# Patient Record
Sex: Female | Born: 1940 | Race: White | Hispanic: No | Smoking: Never smoker
Health system: Southern US, Community
[De-identification: ages and names within clinical notes are randomized; demographics above are authoritative.]

## PROBLEM LIST (undated history)

## (undated) DIAGNOSIS — E079 Disorder of thyroid, unspecified: Secondary | ICD-10-CM

## (undated) DIAGNOSIS — K219 Gastro-esophageal reflux disease without esophagitis: Secondary | ICD-10-CM

## (undated) DIAGNOSIS — F329 Major depressive disorder, single episode, unspecified: Secondary | ICD-10-CM

## (undated) DIAGNOSIS — E785 Hyperlipidemia, unspecified: Secondary | ICD-10-CM

## (undated) DIAGNOSIS — I10 Essential (primary) hypertension: Secondary | ICD-10-CM

## (undated) DIAGNOSIS — F419 Anxiety disorder, unspecified: Secondary | ICD-10-CM

## (undated) DIAGNOSIS — N189 Chronic kidney disease, unspecified: Secondary | ICD-10-CM

## (undated) DIAGNOSIS — M25569 Pain in unspecified knee: Secondary | ICD-10-CM

## (undated) DIAGNOSIS — T7840XA Allergy, unspecified, initial encounter: Secondary | ICD-10-CM

## (undated) DIAGNOSIS — F32A Depression, unspecified: Secondary | ICD-10-CM

## (undated) DIAGNOSIS — G319 Degenerative disease of nervous system, unspecified: Secondary | ICD-10-CM

## (undated) DIAGNOSIS — H269 Unspecified cataract: Secondary | ICD-10-CM

## (undated) DIAGNOSIS — G8929 Other chronic pain: Secondary | ICD-10-CM

## (undated) HISTORY — DX: Disorder of thyroid, unspecified: E07.9

## (undated) HISTORY — PX: ABDOMINAL HYSTERECTOMY: SHX81

## (undated) HISTORY — PX: CHOLECYSTECTOMY: SHX55

## (undated) HISTORY — PX: OTHER SURGICAL HISTORY: SHX169

## (undated) HISTORY — DX: Chronic kidney disease, unspecified: N18.9

## (undated) HISTORY — DX: Allergy, unspecified, initial encounter: T78.40XA

## (undated) HISTORY — DX: Major depressive disorder, single episode, unspecified: F32.9

## (undated) HISTORY — DX: Depression, unspecified: F32.A

## (undated) HISTORY — DX: Anxiety disorder, unspecified: F41.9

## (undated) HISTORY — DX: Unspecified cataract: H26.9

## (undated) HISTORY — PX: MOUTH SURGERY: SHX715

---

## 2012-10-18 LAB — HM COLONOSCOPY

## 2013-08-29 DIAGNOSIS — H6120 Impacted cerumen, unspecified ear: Secondary | ICD-10-CM | POA: Diagnosis not present

## 2013-09-02 DIAGNOSIS — R29818 Other symptoms and signs involving the nervous system: Secondary | ICD-10-CM | POA: Diagnosis not present

## 2013-09-02 DIAGNOSIS — G9340 Encephalopathy, unspecified: Secondary | ICD-10-CM | POA: Diagnosis not present

## 2013-09-02 DIAGNOSIS — M6281 Muscle weakness (generalized): Secondary | ICD-10-CM | POA: Diagnosis not present

## 2013-10-18 DIAGNOSIS — N390 Urinary tract infection, site not specified: Secondary | ICD-10-CM | POA: Diagnosis not present

## 2013-10-18 DIAGNOSIS — R300 Dysuria: Secondary | ICD-10-CM | POA: Diagnosis not present

## 2013-10-18 DIAGNOSIS — R82998 Other abnormal findings in urine: Secondary | ICD-10-CM | POA: Diagnosis not present

## 2013-12-01 DIAGNOSIS — F411 Generalized anxiety disorder: Secondary | ICD-10-CM | POA: Diagnosis not present

## 2013-12-01 DIAGNOSIS — E039 Hypothyroidism, unspecified: Secondary | ICD-10-CM | POA: Diagnosis not present

## 2013-12-01 DIAGNOSIS — I10 Essential (primary) hypertension: Secondary | ICD-10-CM | POA: Diagnosis not present

## 2013-12-01 DIAGNOSIS — E785 Hyperlipidemia, unspecified: Secondary | ICD-10-CM | POA: Diagnosis not present

## 2013-12-02 DIAGNOSIS — N183 Chronic kidney disease, stage 3 unspecified: Secondary | ICD-10-CM | POA: Insufficient documentation

## 2014-02-03 DIAGNOSIS — R51 Headache: Secondary | ICD-10-CM | POA: Diagnosis not present

## 2014-02-03 DIAGNOSIS — G119 Hereditary ataxia, unspecified: Secondary | ICD-10-CM | POA: Diagnosis not present

## 2014-02-03 DIAGNOSIS — H532 Diplopia: Secondary | ICD-10-CM | POA: Diagnosis not present

## 2014-02-03 DIAGNOSIS — R269 Unspecified abnormalities of gait and mobility: Secondary | ICD-10-CM | POA: Diagnosis not present

## 2014-02-07 DIAGNOSIS — R69 Illness, unspecified: Secondary | ICD-10-CM | POA: Diagnosis not present

## 2014-02-14 DIAGNOSIS — R51 Headache: Secondary | ICD-10-CM | POA: Diagnosis not present

## 2014-02-14 DIAGNOSIS — R269 Unspecified abnormalities of gait and mobility: Secondary | ICD-10-CM | POA: Diagnosis not present

## 2014-02-23 DIAGNOSIS — Z1231 Encounter for screening mammogram for malignant neoplasm of breast: Secondary | ICD-10-CM | POA: Diagnosis not present

## 2014-03-08 DIAGNOSIS — F411 Generalized anxiety disorder: Secondary | ICD-10-CM | POA: Diagnosis not present

## 2014-03-08 DIAGNOSIS — M25569 Pain in unspecified knee: Secondary | ICD-10-CM | POA: Diagnosis not present

## 2014-03-25 ENCOUNTER — Inpatient Hospital Stay (HOSPITAL_COMMUNITY)
Admission: EM | Admit: 2014-03-25 | Discharge: 2014-03-27 | DRG: 059 | Disposition: A | Discharge status: Home / Self Care | Payer: Medicare Other | Attending: Internal Medicine | Admitting: Internal Medicine

## 2014-03-25 ENCOUNTER — Encounter (HOSPITAL_COMMUNITY): Payer: Self-pay | Admitting: Emergency Medicine

## 2014-03-25 ENCOUNTER — Emergency Department (HOSPITAL_COMMUNITY): Payer: Medicare Other

## 2014-03-25 DIAGNOSIS — N390 Urinary tract infection, site not specified: Secondary | ICD-10-CM | POA: Diagnosis not present

## 2014-03-25 DIAGNOSIS — R1310 Dysphagia, unspecified: Secondary | ICD-10-CM | POA: Diagnosis present

## 2014-03-25 DIAGNOSIS — I369 Nonrheumatic tricuspid valve disorder, unspecified: Secondary | ICD-10-CM | POA: Diagnosis not present

## 2014-03-25 DIAGNOSIS — E039 Hypothyroidism, unspecified: Secondary | ICD-10-CM | POA: Diagnosis present

## 2014-03-25 DIAGNOSIS — I10 Essential (primary) hypertension: Secondary | ICD-10-CM | POA: Diagnosis present

## 2014-03-25 DIAGNOSIS — R0789 Other chest pain: Secondary | ICD-10-CM | POA: Diagnosis present

## 2014-03-25 DIAGNOSIS — K222 Esophageal obstruction: Secondary | ICD-10-CM | POA: Diagnosis present

## 2014-03-25 DIAGNOSIS — R079 Chest pain, unspecified: Secondary | ICD-10-CM

## 2014-03-25 DIAGNOSIS — F411 Generalized anxiety disorder: Secondary | ICD-10-CM | POA: Diagnosis present

## 2014-03-25 DIAGNOSIS — R42 Dizziness and giddiness: Secondary | ICD-10-CM | POA: Diagnosis present

## 2014-03-25 DIAGNOSIS — Z66 Do not resuscitate: Secondary | ICD-10-CM | POA: Diagnosis present

## 2014-03-25 DIAGNOSIS — Z9849 Cataract extraction status, unspecified eye: Secondary | ICD-10-CM | POA: Diagnosis not present

## 2014-03-25 DIAGNOSIS — K219 Gastro-esophageal reflux disease without esophagitis: Secondary | ICD-10-CM | POA: Diagnosis present

## 2014-03-25 DIAGNOSIS — H5500 Unspecified nystagmus: Secondary | ICD-10-CM | POA: Diagnosis present

## 2014-03-25 DIAGNOSIS — G119 Hereditary ataxia, unspecified: Secondary | ICD-10-CM | POA: Diagnosis not present

## 2014-03-25 DIAGNOSIS — F329 Major depressive disorder, single episode, unspecified: Secondary | ICD-10-CM | POA: Diagnosis present

## 2014-03-25 DIAGNOSIS — S0990XA Unspecified injury of head, initial encounter: Secondary | ICD-10-CM | POA: Diagnosis not present

## 2014-03-25 DIAGNOSIS — R404 Transient alteration of awareness: Secondary | ICD-10-CM | POA: Diagnosis present

## 2014-03-25 DIAGNOSIS — E860 Dehydration: Secondary | ICD-10-CM | POA: Diagnosis present

## 2014-03-25 DIAGNOSIS — S0993XA Unspecified injury of face, initial encounter: Secondary | ICD-10-CM | POA: Diagnosis not present

## 2014-03-25 DIAGNOSIS — N289 Disorder of kidney and ureter, unspecified: Secondary | ICD-10-CM | POA: Diagnosis present

## 2014-03-25 DIAGNOSIS — W19XXXA Unspecified fall, initial encounter: Secondary | ICD-10-CM | POA: Diagnosis present

## 2014-03-25 DIAGNOSIS — S199XXA Unspecified injury of neck, initial encounter: Secondary | ICD-10-CM | POA: Diagnosis not present

## 2014-03-25 DIAGNOSIS — E785 Hyperlipidemia, unspecified: Secondary | ICD-10-CM | POA: Diagnosis present

## 2014-03-25 DIAGNOSIS — R55 Syncope and collapse: Secondary | ICD-10-CM | POA: Diagnosis present

## 2014-03-25 DIAGNOSIS — F3289 Other specified depressive episodes: Secondary | ICD-10-CM | POA: Diagnosis present

## 2014-03-25 DIAGNOSIS — H532 Diplopia: Secondary | ICD-10-CM | POA: Diagnosis present

## 2014-03-25 DIAGNOSIS — G319 Degenerative disease of nervous system, unspecified: Secondary | ICD-10-CM

## 2014-03-25 HISTORY — DX: Essential (primary) hypertension: I10

## 2014-03-25 HISTORY — DX: Other chronic pain: G89.29

## 2014-03-25 HISTORY — DX: Gastro-esophageal reflux disease without esophagitis: K21.9

## 2014-03-25 HISTORY — DX: Degenerative disease of nervous system, unspecified: G31.9

## 2014-03-25 HISTORY — DX: Hyperlipidemia, unspecified: E78.5

## 2014-03-25 HISTORY — DX: Pain in unspecified knee: M25.569

## 2014-03-25 LAB — URINE MICROSCOPIC-ADD ON

## 2014-03-25 LAB — BASIC METABOLIC PANEL
Anion gap: 10 (ref 5–15)
BUN: 32 mg/dL — AB (ref 6–23)
CO2: 26 mEq/L (ref 19–32)
CREATININE: 1.3 mg/dL — AB (ref 0.50–1.10)
Calcium: 9.1 mg/dL (ref 8.4–10.5)
Chloride: 107 mEq/L (ref 96–112)
GFR calc Af Amer: 46 mL/min — ABNORMAL LOW (ref >90)
GFR, EST NON AFRICAN AMERICAN: 39 mL/min — AB (ref >90)
Glucose, Bld: 97 mg/dL (ref 70–99)
Potassium: 4.4 mEq/L (ref 3.7–5.3)
Sodium: 143 mEq/L (ref 137–147)

## 2014-03-25 LAB — URINALYSIS, ROUTINE W REFLEX MICROSCOPIC
Bilirubin Urine: NEGATIVE
GLUCOSE, UA: NEGATIVE mg/dL
HGB URINE DIPSTICK: NEGATIVE
Ketones, ur: NEGATIVE mg/dL
Nitrite: POSITIVE — AB
Protein, ur: NEGATIVE mg/dL
Specific Gravity, Urine: 1.016 (ref 1.005–1.030)
Urobilinogen, UA: 0.2 mg/dL (ref 0.0–1.0)
pH: 7 (ref 5.0–8.0)

## 2014-03-25 LAB — CBC WITH DIFFERENTIAL/PLATELET
BASOS PCT: 2 % — AB (ref 0–1)
Basophils Absolute: 0.1 10*3/uL (ref 0.0–0.1)
Eosinophils Absolute: 0.3 10*3/uL (ref 0.0–0.7)
Eosinophils Relative: 7 % — ABNORMAL HIGH (ref 0–5)
HEMATOCRIT: 33 % — AB (ref 36.0–46.0)
HEMOGLOBIN: 10.4 g/dL — AB (ref 12.0–15.0)
Lymphocytes Relative: 27 % (ref 12–46)
Lymphs Abs: 1.3 10*3/uL (ref 0.7–4.0)
MCH: 30.2 pg (ref 26.0–34.0)
MCHC: 31.5 g/dL (ref 30.0–36.0)
MCV: 95.9 fL (ref 78.0–100.0)
MONO ABS: 0.4 10*3/uL (ref 0.1–1.0)
MONOS PCT: 9 % (ref 3–12)
Neutro Abs: 2.6 10*3/uL (ref 1.7–7.7)
Neutrophils Relative %: 55 % (ref 43–77)
Platelets: 147 10*3/uL — ABNORMAL LOW (ref 150–400)
RBC: 3.44 MIL/uL — ABNORMAL LOW (ref 3.87–5.11)
RDW: 13.4 % (ref 11.5–15.5)
WBC: 4.7 10*3/uL (ref 4.0–10.5)

## 2014-03-25 LAB — OSMOLALITY, URINE: OSMOLALITY UR: 613 mosm/kg (ref 390–1090)

## 2014-03-25 LAB — LIPID PANEL
CHOLESTEROL: 161 mg/dL (ref 0–200)
HDL: 48 mg/dL (ref >39)
LDL Cholesterol: 88 mg/dL (ref 0–99)
TRIGLYCERIDES: 124 mg/dL (ref <150)
Total CHOL/HDL Ratio: 3.4 RATIO
VLDL: 25 mg/dL (ref 0–40)

## 2014-03-25 LAB — OSMOLALITY: OSMOLALITY: 302 mosm/kg — AB (ref 275–300)

## 2014-03-25 LAB — TROPONIN I: Troponin I: <0.3 ng/mL (ref <0.30)

## 2014-03-25 LAB — MAGNESIUM: Magnesium: 2 mg/dL (ref 1.5–2.5)

## 2014-03-25 LAB — HEMOGLOBIN A1C
HEMOGLOBIN A1C: 5.9 % — AB (ref <5.7)
Mean Plasma Glucose: 123 mg/dL — ABNORMAL HIGH (ref <117)

## 2014-03-25 MED ORDER — DICLOFENAC SODIUM 75 MG PO TBEC
75.0000 mg | DELAYED_RELEASE_TABLET | Freq: Two times a day (BID) | ORAL | Status: DC
  Administered 2014-03-25 – 2014-03-27 (×4): 75 mg via ORAL
  Filled 2014-03-25 (×5): qty 1

## 2014-03-25 MED ORDER — ATORVASTATIN CALCIUM 40 MG PO TABS
40.0000 mg | ORAL_TABLET | Freq: Every day | ORAL | Status: DC
  Administered 2014-03-25 – 2014-03-26 (×2): 40 mg via ORAL
  Filled 2014-03-25 (×3): qty 1

## 2014-03-25 MED ORDER — ONDANSETRON HCL 4 MG/2ML IJ SOLN: 4.0000 mg | Freq: Four times a day (QID) | INTRAMUSCULAR | Status: DC | PRN

## 2014-03-25 MED ORDER — ADULT MULTIVITAMIN W/MINERALS CH
1.0000 | ORAL_TABLET | Freq: Every day | ORAL | Status: DC
  Administered 2014-03-26 – 2014-03-27 (×2): 1 via ORAL
  Filled 2014-03-25 (×2): qty 1

## 2014-03-25 MED ORDER — ASPIRIN 81 MG PO CHEW
324.0000 mg | CHEWABLE_TABLET | Freq: Once | ORAL | Status: DC
  Filled 2014-03-25: qty 4

## 2014-03-25 MED ORDER — ASPIRIN 81 MG PO CHEW
324.0000 mg | CHEWABLE_TABLET | Freq: Once | ORAL | Status: AC
  Administered 2014-03-25: 324 mg via ORAL
  Filled 2014-03-25: qty 4

## 2014-03-25 MED ORDER — HYDRALAZINE HCL 20 MG/ML IJ SOLN
5.0000 mg | Freq: Once | INTRAMUSCULAR | Status: AC
Start: 2014-03-25 — End: 2014-03-25
  Administered 2014-03-25: 5 mg via INTRAVENOUS
  Filled 2014-03-25: qty 1

## 2014-03-25 MED ORDER — SODIUM CHLORIDE 0.9 % IV SOLN
INTRAVENOUS | Status: DC
  Administered 2014-03-25: 75 mL/h via INTRAVENOUS
  Administered 2014-03-26: 04:00:00 via INTRAVENOUS

## 2014-03-25 MED ORDER — SODIUM CHLORIDE 0.9 % IJ SOLN: 3.0000 mL | Freq: Two times a day (BID) | INTRAMUSCULAR | Status: DC

## 2014-03-25 MED ORDER — PANTOPRAZOLE SODIUM 40 MG PO TBEC
40.0000 mg | DELAYED_RELEASE_TABLET | Freq: Every day | ORAL | Status: DC
  Administered 2014-03-26 – 2014-03-27 (×2): 40 mg via ORAL
  Filled 2014-03-25 (×2): qty 1

## 2014-03-25 MED ORDER — SERTRALINE HCL 100 MG PO TABS
100.0000 mg | ORAL_TABLET | Freq: Every day | ORAL | Status: DC
  Administered 2014-03-25 – 2014-03-27 (×3): 100 mg via ORAL
  Filled 2014-03-25 (×3): qty 1

## 2014-03-25 MED ORDER — GUAIFENESIN-DM 100-10 MG/5ML PO SYRP: 5.0000 mL | ORAL_SOLUTION | ORAL | Status: DC | PRN

## 2014-03-25 MED ORDER — ASPIRIN EC 81 MG PO TBEC
81.0000 mg | DELAYED_RELEASE_TABLET | Freq: Every day | ORAL | Status: DC
  Administered 2014-03-26 – 2014-03-27 (×2): 81 mg via ORAL
  Filled 2014-03-25 (×2): qty 1

## 2014-03-25 MED ORDER — LEVOTHYROXINE SODIUM 25 MCG PO TABS
25.0000 ug | ORAL_TABLET | Freq: Every day | ORAL | Status: DC
  Administered 2014-03-26 – 2014-03-27 (×2): 25 ug via ORAL
  Filled 2014-03-25 (×3): qty 1

## 2014-03-25 MED ORDER — MECLIZINE HCL 25 MG PO TABS
25.0000 mg | ORAL_TABLET | Freq: Three times a day (TID) | ORAL | Status: DC | PRN
  Administered 2014-03-25: 25 mg via ORAL
  Filled 2014-03-25: qty 1

## 2014-03-25 MED ORDER — ONDANSETRON HCL 4 MG PO TABS: 4.0000 mg | ORAL_TABLET | Freq: Four times a day (QID) | ORAL | Status: DC | PRN

## 2014-03-25 MED ORDER — POLYETHYLENE GLYCOL 3350 17 G PO PACK
17.0000 g | PACK | Freq: Every day | ORAL | Status: DC | PRN
  Filled 2014-03-25: qty 1

## 2014-03-25 MED ORDER — HEPARIN SODIUM (PORCINE) 5000 UNIT/ML IJ SOLN
5000.0000 [IU] | Freq: Three times a day (TID) | INTRAMUSCULAR | Status: DC
  Administered 2014-03-25 – 2014-03-27 (×5): 5000 [IU] via SUBCUTANEOUS
  Filled 2014-03-25 (×8): qty 1

## 2014-03-25 MED ORDER — SODIUM CHLORIDE 0.9 % IV SOLN
INTRAVENOUS | Status: DC
  Administered 2014-03-25: 16:00:00 via INTRAVENOUS

## 2014-03-25 MED ORDER — DEXTROSE 5 % IV SOLN
1.0000 g | Freq: Once | INTRAVENOUS | Status: AC
  Administered 2014-03-25: 1 g via INTRAVENOUS
  Filled 2014-03-25: qty 10

## 2014-03-25 MED ORDER — DIAZEPAM 2 MG PO TABS
2.0000 mg | ORAL_TABLET | Freq: Two times a day (BID) | ORAL | Status: DC | PRN
  Administered 2014-03-25 – 2014-03-26 (×2): 2 mg via ORAL
  Filled 2014-03-25 (×2): qty 1

## 2014-03-25 NOTE — ED Notes (Signed)
Bed: BD25 Expected date: 03/25/14 Expected time: 12:41 PM Means of arrival: Ambulance Comments: fall

## 2014-03-25 NOTE — Consult Note (Addendum)
CONSULT NOTE  Date: 03/25/2014               Patient Name:  Tamara Stein MRN: [id number]  DOB: [birth year] Age / Sex: 73 y.o., female        PCP: No primary provider on file. Primary Cardiologist: New / Unique Sillas            Referring Physician: Ronnie Derby              Reason for Consult: CP , dizziness           History of Present Illness: Patient is a 73 y.o. female with a PMHx of cerebellar atrophy/degeneration, gastroesophageal reflux disease, hypertension, hyperlipidemia, who was admitted to [facility] on 03/25/2014 for evaluation of episode of dizziness and falling off her scooter associated with some chest pain.  The patient has a history of cerebellar degeneration.  She is to be a lot but has had profound dizziness and would fall frequently. She's not able to walk now .  She has frequent episodes of dizziness . She uses a motor scooter to get around. Today she had an episode of dizziness and actually fell off her scooter. The scooter fell on top of her. She then experienced some chest pain that she thinks is similar to her gastroesophageal reflux disease..   She is very comfortable at this point. EKG reveals very minimal T wave changes.   Medications: Outpatient medications: Prescriptions prior to admission  Medication Sig Dispense Refill  . aspirin EC 81 MG tablet Take 81 mg by mouth daily.      Marland Kitchen atorvastatin (LIPITOR) 40 MG tablet Take 40 mg by mouth at bedtime.      . Calcium Carbonate-Vitamin D (CALTRATE 600+D PO) Take 1 tablet by mouth daily.      . diazepam (VALIUM) 2 MG tablet Take 2 mg by mouth every 6 (six) hours as needed for anxiety.      . diclofenac (VOLTAREN) 75 MG EC tablet Take 75 mg by mouth 2 (two) times daily.      . fexofenadine (ALLEGRA) 180 MG tablet Take 180 mg by mouth daily.      Marland Kitchen levothyroxine (SYNTHROID, LEVOTHROID) 25 MCG tablet Take 25 mcg by mouth daily before breakfast.      . lisinopril-hydrochlorothiazide (PRINZIDE,ZESTORETIC) 10-12.5 MG  per tablet Take 1 tablet by mouth daily.      . meclizine (ANTIVERT) 25 MG tablet Take 25 mg by mouth 3 (three) times daily as needed for dizziness.      . Multiple Vitamin (MULTIVITAMIN WITH MINERALS) TABS tablet Take 1 tablet by mouth daily.      Marland Kitchen omeprazole (PRILOSEC) 40 MG capsule Take 40 mg by mouth daily.      . sertraline (ZOLOFT) 100 MG tablet Take 100 mg by mouth daily.        Current medications: Current Facility-Administered Medications  Medication Dose Route Frequency Provider Last Rate Last Dose  . 0.9 %  sodium chloride infusion   Intravenous Continuous Thurnell Lose, MD 75 mL/hr at 03/25/14 1652 75 mL/hr at 03/25/14 1652  . [START ON 03/26/2014] aspirin EC tablet 81 mg  81 mg Oral Daily Thurnell Lose, MD      . atorvastatin (LIPITOR) tablet 40 mg  40 mg Oral QHS Thurnell Lose, MD      . diazepam (VALIUM) tablet 2 mg  2 mg Oral Q12H PRN Thurnell Lose, MD      .  diclofenac (VOLTAREN) EC tablet 75 mg  75 mg Oral BID Thurnell Lose, MD      . guaiFENesin-dextromethorphan (ROBITUSSIN DM) 100-10 MG/5ML syrup 5 mL  5 mL Oral Q4H PRN Thurnell Lose, MD      . heparin injection 5,000 Units  5,000 Units Subcutaneous 3 times per day Thurnell Lose, MD      . Derrill Memo ON 03/26/2014] levothyroxine (SYNTHROID, LEVOTHROID) tablet 25 mcg  25 mcg Oral QAC breakfast Thurnell Lose, MD      . meclizine (ANTIVERT) tablet 25 mg  25 mg Oral TID PRN Thurnell Lose, MD      . Derrill Memo ON 03/26/2014] multivitamin with minerals tablet 1 tablet  1 tablet Oral Daily Thurnell Lose, MD      . ondansetron (ZOFRAN) tablet 4 mg  4 mg Oral Q6H PRN Thurnell Lose, MD       Or  . ondansetron (ZOFRAN) injection 4 mg  4 mg Intravenous Q6H PRN Thurnell Lose, MD      . Derrill Memo ON 03/26/2014] pantoprazole (PROTONIX) EC tablet 40 mg  40 mg Oral Daily Thurnell Lose, MD      . polyethylene glycol (MIRALAX / GLYCOLAX) packet 17 g  17 g Oral Daily PRN Thurnell Lose, MD      . sertraline  (ZOLOFT) tablet 100 mg  100 mg Oral Daily Thurnell Lose, MD   100 mg at 03/25/14 1733  . sodium chloride 0.9 % injection 3 mL  3 mL Intravenous Q12H Thurnell Lose, MD         Allergies  Allergen Reactions  . Codeine     Knocks her out  . Penicillins Diarrhea  . Sulfa Antibiotics     Facial swelling      Past Medical History  Diagnosis Date  . GERD (gastroesophageal reflux disease)   . Hypertension   . Hyperlipidemia   . Cerebellar degeneration   . Chronic knee pain     History reviewed. No pertinent past surgical history.  History reviewed. No pertinent family history.  Social History:  reports that she has never smoked. She does not have any smokeless tobacco history on file. Her alcohol and drug histories are not on file.   Review of Systems: Constitutional:  denies fever, chills, diaphoresis, appetite change and fatigue.  HEENT: denies photophobia, eye pain, redness, hearing loss, ear pain, congestion, sore throat, rhinorrhea, sneezing, neck pain, neck stiffness and tinnitus.  Respiratory: denies SOB, DOE, cough, chest tightness, and wheezing.  Cardiovascular: denies chest pain, palpitations and leg swelling.  Gastrointestinal: admits to nausea, vomiting, abdominal pain, diarrhea, constipation, blood in stool.  Genitourinary: denies dysuria, urgency, frequency, hematuria, flank pain and difficulty urinating.  Musculoskeletal: denies  myalgias, back pain, joint swelling, arthralgias and gait problem.   Skin: denies pallor, rash and wound.  Neurological: She has frequent episodes of dizziness    Hematological: denies adenopathy, easy bruising, personal or family bleeding history.  Psychiatric/ Behavioral: denies suicidal ideation, mood changes, confusion, nervousness, sleep disturbance and agitation.    Physical Exam: BP 174/79  Pulse 73  Temp(Src) 98 F (36.7 C) (Oral)  Resp 20  Ht 5' 2"  (1.575 m)  Wt 200 lb (90.719 kg)  BMI 36.57 kg/m2  SpO2 100%  Wt  Readings from Last 3 Encounters:  03/25/14 200 lb (90.719 kg)    General: Vital signs reviewed and noted. Well-developed, well-nourished, in no acute distress; alert  Head: Normocephalic, atraumatic, sclera  anicteric,   Neck: Supple. Negative for carotid bruits. No JVD   Lungs:  Clear bilaterally, no  wheezes, rales, or rhonchi. Breathing is normal   Heart: RRR with S1 S2. No murmurs, rubs, or gallops   Abdomen:  Soft, non-tender, non-distended with normoactive bowel sounds. No hepatomegaly. No rebound/guarding. No obvious abdominal masses   MSK: Strength and the appear normal for age.   Extremities: No clubbing or cyanosis. No edema.  Distal pedal pulses are 2+ and equal   Neurologic: Alert and oriented X 3.  Speech is somewhat slurred.  Gross motor movements are intact.   Psych: Responds to questions appropriately with a normal affect.     Lab results: Basic Metabolic Panel:  Recent Labs Lab 03/25/14 1340  NA 143  K 4.4  CL 107  CO2 26  GLUCOSE 97  BUN 32*  CREATININE 1.30*  CALCIUM 9.1  MG 2.0    Liver Function Tests: No results found for this basename: AST, ALT, ALKPHOS, BILITOT, PROT, ALBUMIN,  in the last 168 hours No results found for this basename: LIPASE, AMYLASE,  in the last 168 hours No results found for this basename: AMMONIA,  in the last 168 hours  CBC:  Recent Labs Lab 03/25/14 1340  WBC 4.7  NEUTROABS 2.6  HGB 10.4*  HCT 33.0*  MCV 95.9  PLT 147*    Cardiac Enzymes:  Recent Labs Lab 03/25/14 1340  TROPONINI <0.30    BNP: No components found with this basename: POCBNP,   CBG: No results found for this basename: GLUCAP,  in the last 168 hours  Coagulation Studies: No results found for this basename: LABPROT, INR,  in the last 72 hours   Other results:  EKG:  NSR , NS T wave abn. Laterally    Imaging: Dg Chest 2 View  03/25/2014   CLINICAL DATA:  Syncope.  EXAM: CHEST  2 VIEW  COMPARISON:  None.  FINDINGS: Heart size is mildly  enlarged but there is no pulmonary edema. Lungs are clear. Prominent hiatal hernia is noted. No pneumothorax or pleural effusion. Degenerative change is present about the shoulders.  IMPRESSION: Bowel cardiomegaly without acute disease.  Hiatal hernia.   Electronically Signed   By: Inge Rise M.D.   On: 03/25/2014 15:01   Ct Head Wo Contrast  03/25/2014   CLINICAL DATA:  Fall.  Dizziness and nausea.  EXAM: CT HEAD WITHOUT CONTRAST  CT CERVICAL SPINE WITHOUT CONTRAST  TECHNIQUE: Multidetector CT imaging of the head and cervical spine was performed following the standard protocol without intravenous contrast. Multiplanar CT image reconstructions of the cervical spine were also generated.  COMPARISON:  None.  FINDINGS: CT HEAD FINDINGS  Ventricles are normal in configuration. There is ventricular and sulcal enlargement reflecting mild to moderate atrophy. Cerebellar atrophy is more prominent. Patchy white matter hypoattenuation is noted consistent with moderate chronic microvascular ischemic change. There are no parenchymal masses or mass effect. There is no evidence of a cortical infarct.  There are no extra-axial masses or abnormal fluid collections.  No intracranial hemorrhage  Visualized sinuses and mastoid air cells are clear. No skull lesion or fracture.  CT CERVICAL SPINE FINDINGS  No fracture. There is a grade 1 anterolisthesis of C4 on C5 that appears degenerative in origin. There is mild loss of disc height at C4-C5 with moderate loss disc height at C5-C6 where there is also endplate irregularity, sclerosis and spurring. Facet degenerative changes greatest on the right at C3-C4 and on the  left from C2-C3 through C6-C7.  Bones are demineralized. Soft tissues show vascular calcifications. There is no mass or adenopathy.  Lung apices show areas of interstitial prominence and mild scarring.  IMPRESSION: HEAD CT:  No acute intracranial abnormalities.  No skull fracture.  CERVICAL CT:  No fracture or acute  finding.   Electronically Signed   By: Lajean Manes M.D.   On: 03/25/2014 14:56   Ct Cervical Spine Wo Contrast  03/25/2014   CLINICAL DATA:  Fall.  Dizziness and nausea.  EXAM: CT HEAD WITHOUT CONTRAST  CT CERVICAL SPINE WITHOUT CONTRAST  TECHNIQUE: Multidetector CT imaging of the head and cervical spine was performed following the standard protocol without intravenous contrast. Multiplanar CT image reconstructions of the cervical spine were also generated.  COMPARISON:  None.  FINDINGS: CT HEAD FINDINGS  Ventricles are normal in configuration. There is ventricular and sulcal enlargement reflecting mild to moderate atrophy. Cerebellar atrophy is more prominent. Patchy white matter hypoattenuation is noted consistent with moderate chronic microvascular ischemic change. There are no parenchymal masses or mass effect. There is no evidence of a cortical infarct.  There are no extra-axial masses or abnormal fluid collections.  No intracranial hemorrhage  Visualized sinuses and mastoid air cells are clear. No skull lesion or fracture.  CT CERVICAL SPINE FINDINGS  No fracture. There is a grade 1 anterolisthesis of C4 on C5 that appears degenerative in origin. There is mild loss of disc height at C4-C5 with moderate loss disc height at C5-C6 where there is also endplate irregularity, sclerosis and spurring. Facet degenerative changes greatest on the right at C3-C4 and on the left from C2-C3 through C6-C7.  Bones are demineralized. Soft tissues show vascular calcifications. There is no mass or adenopathy.  Lung apices show areas of interstitial prominence and mild scarring.  IMPRESSION: HEAD CT:  No acute intracranial abnormalities.  No skull fracture.  CERVICAL CT:  No fracture or acute finding.   Electronically Signed   By: Lajean Manes M.D.   On: 03/25/2014 14:56      *    Assessment & Plan:  1. Chest discomfort: The patient's cardiac enzymes are negative. She thinks that the pains are similar to her  gastroesophageal reflux pain.  At this point I would get an echocardiogram. Troponins are negative so far.    At this point I would be relatively conservative. She's not a great candidate for invasive procedures.  Episodes of dizziness are likely to be due to her cerebellar degeneration.  This seems to be gradually worsening.  2. Syncope: This may have been due to her dizziness. We will we'll monitor overnight to make sure that her rhythm stable.  Her telemetry reveals normal sinus rhythm without arrhythmias at this point.  We'll see her tomorrow  For  followup visit      Ramond Dial., MD, [facility] 03/25/2014, 5:42 PM Office - [phone] Pager [phone]

## 2014-03-25 NOTE — H&P (Signed)
Patient Demographics  Tamara Stein, is a 73 y.o. female  MRN: [id number]   DOB - 09/19/1940  Admit Date - 03/25/2014  Outpatient Primary MD for the patient is No primary provider on file.   With History of -  Past Medical History  Diagnosis Date  . GERD (gastroesophageal reflux disease)   . Hypertension   . Hyperlipidemia   . Cerebellar degeneration   . Chronic knee pain       History reviewed. No pertinent past surgical history.  in for   Chief Complaint  Patient presents with  . Fall  . Dizziness  . Nausea     HPI  Tamara Stein  is a 73 y.o. female,  With H/O HTN, Dyslipidemia, GERD, Cerebellar Degeneration, Nystagmus, Dizziness, Dysphagia, who uses a scooter to ambulate, who is in her driveway on the scooter when she passed out and lost consciousness for a few minutes, she fell on the floor, no seizure-like activity, no bowel bladder incontinence or tongue bite. She has had dizziness for several years and attributes that to her cerebellar degeneration. Came to the ER after the syncopal episode, in the ER a sensual workup which included blood work, head and neck CT scan, chest x-ray and UA were unremarkable. EKG showed some T-wave inversion in lead 1 and aVL, she does report some intermittent chest pain but none today.   Was called to admit the patient for syncope workup.    Review of Systems    In addition to the HPI above,   No Fever-chills, No Headache, No changes with Vision or hearing, chronic nystagmus, diplopia and dizziness +ve problems swallowing food or Liquids, No Chest pain, Cough or Shortness of Breath, No Abdominal pain, No Nausea or Vommitting, Bowel movements are regular, No Blood in stool or Urine, No dysuria, No new skin rashes or bruises, No new joints pains-aches,   No new weakness, tingling, numbness in any extremity, chronic leg weakness No recent weight gain or loss, No polyuria, polydypsia or polyphagia, No significant Mental Stressors.  A full 10 point Review of Systems was done, except as stated above, all other Review of Systems were negative.   Social History History  Substance Use Topics  . Smoking status: Never Smoker   . Smokeless tobacco: Not on file  . Alcohol Use: Not on file      Family History ++ Cerebellar degeneration in 2 sisters  Prior to Admission medications   Medication Sig Start Date End Date Taking? Authorizing Provider  aspirin EC 81 MG tablet Take 81 mg by mouth daily.   Yes Historical Provider, MD  atorvastatin (LIPITOR) 40 MG tablet Take 40 mg by mouth at bedtime.   Yes Historical Provider, MD  Calcium Carbonate-Vitamin D (CALTRATE 600+D PO) Take 1 tablet by mouth daily.   Yes Historical Provider, MD  diazepam (VALIUM) 2 MG tablet Take 2 mg by mouth every 6 (six) hours as needed for anxiety.  Yes Historical Provider, MD  diclofenac (VOLTAREN) 75 MG EC tablet Take 75 mg by mouth 2 (two) times daily.   Yes Historical Provider, MD  fexofenadine (ALLEGRA) 180 MG tablet Take 180 mg by mouth daily.   Yes Historical Provider, MD  levothyroxine (SYNTHROID, LEVOTHROID) 25 MCG tablet Take 25 mcg by mouth daily before breakfast.   Yes Historical Provider, MD  lisinopril-hydrochlorothiazide (PRINZIDE,ZESTORETIC) 10-12.5 MG per tablet Take 1 tablet by mouth daily.   Yes Historical Provider, MD  meclizine (ANTIVERT) 25 MG tablet Take 25 mg by mouth 3 (three) times daily as needed for dizziness.   Yes Historical Provider, MD  Multiple Vitamin (MULTIVITAMIN WITH MINERALS) TABS tablet Take 1 tablet by mouth daily.   Yes Historical Provider, MD  omeprazole (PRILOSEC) 40 MG capsule Take 40 mg by mouth daily.   Yes Historical Provider, MD  sertraline (ZOLOFT) 100 MG tablet Take 100 mg by mouth daily.   Yes Historical Provider, MD     Allergies  Allergen Reactions  . Codeine     Knocks her out  . Penicillins Diarrhea  . Sulfa Antibiotics     Facial swelling     Physical Exam  Vitals  Blood pressure 133/64, pulse 72, temperature 98 F (36.7 C), temperature source Oral, resp. rate 19, SpO2 99.00%.   1. General obese elderly white female lying in bed in NAD,    2. Normal affect and insight, Not Suicidal or Homicidal, Awake Alert, Oriented X 3.  3. No F.N deficits, ALL C.Nerves Intact, Strength 5/5 upper extremities, 3/5 legs, Sensation intact all 4 extremities, Plantars down going, ++ nystagmus   4. Ears and Eyes appear Normal, Conjunctivae clear, PERRLA. Moist Oral Mucosa.  5. Supple Neck, No JVD, No cervical lymphadenopathy appriciated, No Carotid Bruits.  6. Symmetrical Chest wall movement, Good air movement bilaterally, CTAB.  7. RRR, No Gallops, Rubs or Murmurs, No Parasternal Heave.  8. Positive Bowel Sounds, Abdomen Soft, No tenderness, No organomegaly appriciated,No rebound -guarding or rigidity.  9.  No Cyanosis, Normal Skin Turgor, No Skin Rash or Bruise.  10. Good muscle tone,  joints appear normal , no effusions, Normal ROM.  11. No Palpable Lymph Nodes in Neck or Axillae     Data Review  CBC  Recent Labs Lab 03/25/14 1340  WBC 4.7  HGB 10.4*  HCT 33.0*  PLT 147*  MCV 95.9  MCH 30.2  MCHC 31.5  RDW 13.4  LYMPHSABS 1.3  MONOABS 0.4  EOSABS 0.3  BASOSABS 0.1   ------------------------------------------------------------------------------------------------------------------  Chemistries   Recent Labs Lab 03/25/14 1340  NA 143  K 4.4  CL 107  CO2 26  GLUCOSE 97  BUN 32*  CREATININE 1.30*  CALCIUM 9.1  MG 2.0   ------------------------------------------------------------------------------------------------------------------ CrCl is unknown because there is no height on file for the current  visit. ------------------------------------------------------------------------------------------------------------------ No results found for this basename: TSH, T4TOTAL, FREET3, T3FREE, THYROIDAB,  in the last 72 hours   Coagulation profile No results found for this basename: INR, PROTIME,  in the last 168 hours ------------------------------------------------------------------------------------------------------------------- No results found for this basename: DDIMER,  in the last 72 hours -------------------------------------------------------------------------------------------------------------------  Cardiac Enzymes  Recent Labs Lab 03/25/14 1340  TROPONINI <0.30   ------------------------------------------------------------------------------------------------------------------ No components found with this basename: POCBNP,    ---------------------------------------------------------------------------------------------------------------  Urinalysis    Component Value Date/Time   COLORURINE YELLOW 03/25/2014 [place] 03/25/2014 1359   LABSPEC 1.016 03/25/2014 1359   PHURINE 7.0 03/25/2014 1359   [place]  03/25/2014 [place] 03/25/2014 [place] 03/25/2014 [place] 03/25/2014 1359   PROTEINUR NEGATIVE 03/25/2014 1359   UROBILINOGEN 0.2 03/25/2014 1359   NITRITE POSITIVE* 03/25/2014 1359   LEUKOCYTESUR SMALL* 03/25/2014 1359    ----------------------------------------------------------------------------------------------------------------  Imaging results:   Dg Chest 2 View  03/25/2014   CLINICAL DATA:  Syncope.  EXAM: CHEST  2 VIEW  COMPARISON:  None.  FINDINGS: Heart size is mildly enlarged but there is no pulmonary edema. Lungs are clear. Prominent hiatal hernia is noted. No pneumothorax or pleural effusion. Degenerative change is present about the shoulders.  IMPRESSION: Bowel cardiomegaly without  acute disease.  Hiatal hernia.   Electronically Signed   By: Inge Rise M.D.   On: 03/25/2014 15:01   Ct Head Wo Contrast  03/25/2014   CLINICAL DATA:  Fall.  Dizziness and nausea.  EXAM: CT HEAD WITHOUT CONTRAST  CT CERVICAL SPINE WITHOUT CONTRAST  TECHNIQUE: Multidetector CT imaging of the head and cervical spine was performed following the standard protocol without intravenous contrast. Multiplanar CT image reconstructions of the cervical spine were also generated.  COMPARISON:  None.  FINDINGS: CT HEAD FINDINGS  Ventricles are normal in configuration. There is ventricular and sulcal enlargement reflecting mild to moderate atrophy. Cerebellar atrophy is more prominent. Patchy white matter hypoattenuation is noted consistent with moderate chronic microvascular ischemic change. There are no parenchymal masses or mass effect. There is no evidence of a cortical infarct.  There are no extra-axial masses or abnormal fluid collections.  No intracranial hemorrhage  Visualized sinuses and mastoid air cells are clear. No skull lesion or fracture.  CT CERVICAL SPINE FINDINGS  No fracture. There is a grade 1 anterolisthesis of C4 on C5 that appears degenerative in origin. There is mild loss of disc height at C4-C5 with moderate loss disc height at C5-C6 where there is also endplate irregularity, sclerosis and spurring. Facet degenerative changes greatest on the right at C3-C4 and on the left from C2-C3 through C6-C7.  Bones are demineralized. Soft tissues show vascular calcifications. There is no mass or adenopathy.  Lung apices show areas of interstitial prominence and mild scarring.  IMPRESSION: HEAD CT:  No acute intracranial abnormalities.  No skull fracture.  CERVICAL CT:  No fracture or acute finding.   Electronically Signed   By: Lajean Manes M.D.   On: 03/25/2014 14:56   Ct Cervical Spine Wo Contrast  03/25/2014   CLINICAL DATA:  Fall.  Dizziness and nausea.  EXAM: CT HEAD WITHOUT CONTRAST  CT CERVICAL  SPINE WITHOUT CONTRAST  TECHNIQUE: Multidetector CT imaging of the head and cervical spine was performed following the standard protocol without intravenous contrast. Multiplanar CT image reconstructions of the cervical spine were also generated.  COMPARISON:  None.  FINDINGS: CT HEAD FINDINGS  Ventricles are normal in configuration. There is ventricular and sulcal enlargement reflecting mild to moderate atrophy. Cerebellar atrophy is more prominent. Patchy white matter hypoattenuation is noted consistent with moderate chronic microvascular ischemic change. There are no parenchymal masses or mass effect. There is no evidence of a cortical infarct.  There are no extra-axial masses or abnormal fluid collections.  No intracranial hemorrhage  Visualized sinuses and mastoid air cells are clear. No skull lesion or fracture.  CT CERVICAL SPINE FINDINGS  No fracture. There is a grade 1 anterolisthesis of C4 on C5 that appears degenerative in origin. There is mild loss of disc height at C4-C5 with moderate loss disc  height at C5-C6 where there is also endplate irregularity, sclerosis and spurring. Facet degenerative changes greatest on the right at C3-C4 and on the left from C2-C3 through C6-C7.  Bones are demineralized. Soft tissues show vascular calcifications. There is no mass or adenopathy.  Lung apices show areas of interstitial prominence and mild scarring.  IMPRESSION: HEAD CT:  No acute intracranial abnormalities.  No skull fracture.  CERVICAL CT:  No fracture or acute finding.   Electronically Signed   By: Lajean Manes M.D.   On: 03/25/2014 14:56    My personal review of EKG: Rhythm NSR, Rate 72 /min, flipped t waves lead 1 and aVL     Assessment & Plan   1. Syncope with loss of consciousness. 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 She has long-standing nystagmus, diplopia and dizziness. However she does have some EKG changes.  Will be admitted on a telemetry bed, will  cycle troponins, obtain MRI of the brain to rule out stroke, echogram carotid duplex, will check orthostatic blood pressures to rule out orthostasis as part of her autonomic instability. Continue aspirin which he takes at home. She is chronically bedbound and wheelchair-bound. If workup negative outpatient neurology followup. She has a neurologist in [place].   2. Intermittent chest pain with some EKG changes. Chest pain-free, cycle troponin, telemetry monitor, check echogram, ED has consulted cardiology.   3. Renal insufficiency. No previous labwork in the system, this could be her baseline however she is on her ARB as well, will hold ARB, obtain urine electrolytes, gentle hydration, repeat BMP in the morning.   4. GERD. Continue PPI.   5. Dyslipidemia. On statin which will be continued.   6. Few month history of dysphagia. Her symptoms suggestive of esophageal stricture, we'll request speech to evaluate, she might require pain swallow, for now dysphagia 3 diet, feeding assistance aspiration precautions. Based on our studies we might need to consult GI as well inpatient versus outpatient.   7. Hypothyroidism continue home dose Synthroid.   8. Depression anxiety. Not suicidal homicidal, appear stable, on Zoloft and benzodiazepine which will be continued at home dose.      DVT Prophylaxis Heparin   AM Labs Ordered, also please review Full Orders  Family Communication: Admission, patients condition and plan of care including tests being ordered have been discussed with the patient and brother in [place] indicate understanding and agree with the plan and Code Status.  Code Status DNR  Likely DC to  home  Condition Fair  Time spent in minutes : 35    SINGH,PRASHANT K M.D on 03/25/2014 at 3:39 PM  Between 7am to 7pm - Pager - [phone]  After 7pm go to [email or web address] - password [email or web address]  And look for the night coverage person covering me after hours  [facility]  [facility] Office  [phone]   **Disclaimer: This note may have been dictated with voice recognition software. Similar sounding words can inadvertently be transcribed and this note may contain transcription errors which may not have been corrected upon publication of note.**

## 2014-03-25 NOTE — ED Notes (Signed)
MD at bedside. 

## 2014-03-25 NOTE — ED Notes (Signed)
PER EMS- pt picked up from home c/o fall, reported she has benn dizzy and nauseated x3 days.  No LOC, head injury, or deformities noted.  Arrived to ED alert and oriented per baseline. Denies pain.

## 2014-03-25 NOTE — ED Notes (Signed)
Patient transported to CT 

## 2014-03-25 NOTE — ED Provider Notes (Addendum)
TIME SEEN: 1:25 PM  CHIEF COMPLAINT: Chest pain, shortness of breath, syncope  HPI: Patient is a 73 year old female with history of hypertension, hyperlipidemia, cerebellar degeneration who presents emergency department after a syncopal event. She states that she was in her motorized scooter when she began to feel lightheaded, having chest tightness, nausea and shortness of breath. She states that she passed out and fell out of her sputum and the scooter landed on top of her. She is unsure if she hit her head but states she was having neck pain that has now resolved. No numbness, tingling or new weakness. She is not on anticoagulation. Denies a history of cardiac disease or CHF. States she has had "dizziness" in the past secondary to her cerebellar degeneration but states this feels different. Denies any symptoms currently.   ROS: See HPI Constitutional: no fever  Eyes: no drainage  ENT: no runny nose   Cardiovascular:  chest pain  Resp:  SOB  GI: no vomiting GU: no dysuria Integumentary: no rash  Allergy: no hives  Musculoskeletal: no leg swelling  Neurological: no slurred speech ROS otherwise negative  PAST MEDICAL HISTORY/PAST SURGICAL HISTORY:  Past Medical History  Diagnosis Date  . GERD (gastroesophageal reflux disease)   . Hypertension   . Hyperlipidemia   . Cerebellar degeneration   . Chronic knee pain     MEDICATIONS:  Prior to Admission medications   Not on File    ALLERGIES:  Allergies not on file  SOCIAL HISTORY:  History  Substance Use Topics  . Smoking status: Never Smoker   . Smokeless tobacco: Not on file  . Alcohol Use: Not on file    FAMILY HISTORY: History reviewed. No pertinent family history.  EXAM: BP 133/64  Pulse 72  Temp(Src) 98 F (36.7 C) (Oral)  Resp 19  SpO2 99% CONSTITUTIONAL: Alert and oriented and responds appropriately to questions. Well-appearing; well-nourished HEAD: Normocephalic EYES: Conjunctivae clear, PERRL ENT:  normal nose; no rhinorrhea; moist mucous membranes; pharynx without lesions noted NECK: Supple, no meningismus, no LAD; no midline spinal tenderness or step-off or deformity CARD: RRR; S1 and S2 appreciated; no murmurs, no clicks, no rubs, no gallops RESP: Normal chest excursion without splinting or tachypnea; breath sounds clear and equal bilaterally; no wheezes, no rhonchi, no rales,  ABD/GI: Normal bowel sounds; non-distended; soft, non-tender, no rebound, no guarding BACK:  The back appears normal and is non-tender to palpation, there is no CVA tenderness, no midline spinal tenderness or step-off or deformity EXT: Normal ROM in all joints; non-tender to palpation; no edema; normal capillary refill; no cyanosis    SKIN: Normal color for age and race; warm NEURO: Moves all extremities equally, sensation to light touch intact diffusely, cranial nerves II through XII intact, patient is mildly dysarthric but states this is her baseline PSYCH: The patient's mood and manner are appropriate. Grooming and personal hygiene are appropriate.  MEDICAL DECISION MAKING: Patient here with a syncopal event today. She did have preceding symptoms of chest pain, shortness of breath and nausea as well as lightheadedness. She is currently asymptomatic. Hemodynamically stable and neurologically intact. She does have mild dysarthria that she reports is baseline. We'll obtain labs, urine. We'll also obtain CT of her head and cervical spine given she did fall out of her scooter and states the scooter fell on top of her. Anticipate admission for syncopal workup. Doubt PE or dissection at this time given she is asymptomatic.  ED PROGRESS: Patient's CT head and  cervical spine are negative. Labs are unremarkable. Troponin negative. She does have a urinary tract infection. Culture pending. We'll give ceftriaxone. We'll discuss with hospitalist for admission for syncopal workup, ACS rule out.   D/w Dr. Candiss Norse for admission to  tele, inpt.     Date: 03/25/2014 1:32 PM  Rate: 72  Rhythm: normal sinus rhythm  QRS Axis: normal  Intervals: normal  ST/T Wave abnormalities: T wave inversions in lateral leads  Conduction Disutrbances: none  Narrative Interpretation: T wave inversions in lateral leads, no old for comparison      [place], DO 03/25/14 1517   4:00 PM D/w Dr. Cathie Olden with cardiology who will see pt in consult.  [place], DO 03/25/14 1601

## 2014-03-26 ENCOUNTER — Inpatient Hospital Stay (HOSPITAL_COMMUNITY): Payer: Medicare Other

## 2014-03-26 ENCOUNTER — Encounter (HOSPITAL_COMMUNITY): Payer: Self-pay | Admitting: Radiology

## 2014-03-26 DIAGNOSIS — I369 Nonrheumatic tricuspid valve disorder, unspecified: Secondary | ICD-10-CM

## 2014-03-26 LAB — BASIC METABOLIC PANEL
Anion gap: 9 (ref 5–15)
BUN: 27 mg/dL — AB (ref 6–23)
CALCIUM: 8.8 mg/dL (ref 8.4–10.5)
CO2: 25 mEq/L (ref 19–32)
Chloride: 105 mEq/L (ref 96–112)
Creatinine, Ser: 1.25 mg/dL — ABNORMAL HIGH (ref 0.50–1.10)
GFR calc Af Amer: 48 mL/min — ABNORMAL LOW (ref >90)
GFR calc non Af Amer: 41 mL/min — ABNORMAL LOW (ref >90)
GLUCOSE: 95 mg/dL (ref 70–99)
Potassium: 3.9 mEq/L (ref 3.7–5.3)
Sodium: 139 mEq/L (ref 137–147)

## 2014-03-26 LAB — CBC
HCT: 31.4 % — ABNORMAL LOW (ref 36.0–46.0)
Hemoglobin: 10.2 g/dL — ABNORMAL LOW (ref 12.0–15.0)
MCH: 31 pg (ref 26.0–34.0)
MCHC: 32.5 g/dL (ref 30.0–36.0)
MCV: 95.4 fL (ref 78.0–100.0)
Platelets: 148 10*3/uL — ABNORMAL LOW (ref 150–400)
RBC: 3.29 MIL/uL — ABNORMAL LOW (ref 3.87–5.11)
RDW: 13.4 % (ref 11.5–15.5)
WBC: 5 10*3/uL (ref 4.0–10.5)

## 2014-03-26 LAB — TROPONIN I: Troponin I: <0.3 ng/mL (ref <0.30)

## 2014-03-26 MED ORDER — TRAMADOL HCL 50 MG PO TABS
50.0000 mg | ORAL_TABLET | Freq: Once | ORAL | Status: AC
  Administered 2014-03-26: 50 mg via ORAL
  Filled 2014-03-26: qty 1

## 2014-03-26 MED ORDER — SODIUM CHLORIDE 0.9 % IV SOLN
INTRAVENOUS | Status: AC
  Administered 2014-03-26: 21:00:00 via INTRAVENOUS

## 2014-03-26 NOTE — Evaluation (Signed)
Clinical/Bedside Swallow Evaluation Patient Details  Name: Tamara Stein MRN: [id number] Date of Birth: [birth year]  Today's Date: 03/26/2014 Time: 0930-1005 SLP Time Calculation (min): 35 min  Past Medical History:  Past Medical History  Diagnosis Date  . GERD (gastroesophageal reflux disease)   . Hypertension   . Hyperlipidemia   . Cerebellar degeneration   . Chronic knee pain    Past Surgical History:  Past Surgical History  Procedure Laterality Date  . Cholecystectomy    . Abdominal hysterectomy    . Right elbow     HPI:  73 year old female admitted 03/25/14 due to fall and dizziness. PMH significant for GERD. CT negative for acute findings. MRI pending. BSE ordered to evaluate swallow function and safety.   Assessment / Plan / Recommendation Clinical Impression  Pt presents with adequate oral motor strength and function. There were no overt s/s aspiration or clinical indications of airway compromise with po trials, however, pt reported globus sensation and frequent choking on saliva at night. This raises suspicion for esophageal dysmotility. Pt does have a history of GERD, however, indicated that she has not needed esophageal dilation.  Consideration of a Regular Barium Swallow is recommended, to evaluate esophageal motility. Pt was provided with suggestions for managing esophageal dysmotility, but had multiple things going on - RN providing meds, Lab present to draw blood, going for MRI.  SLP was therefore unable to review this information thoroughly with pt.  ST will return after Barium Swallow, and/or to complete education, and determine if further workup is indicated.  RN aware.    Aspiration Risk  Mild    Diet Recommendation Dysphagia 3 (Mechanical Soft);Thin liquid   Liquid Administration via: Straw;Cup Medication Administration: Whole meds with liquid Supervision: Patient able to self feed Compensations: Slow rate;Small sips/bites;Follow solids with  liquid Postural Changes and/or Swallow Maneuvers: Upright 30-60 min after meal;Seated upright 90 degrees    Other  Recommendations Recommended Consults: Consider esophageal assessment Oral Care Recommendations: Oral care BID   Follow Up Recommendations   (TBD)    Frequency and Duration min 1 x/week  1 week   Pertinent Vitals/Pain VSS, 5/10 posterior headache; RN informed    SLP Swallow Goals  see care plan   Swallow Study Prior Functional Status   Pt reported choking on saliva at night, globus sensation    General Date of Onset: 03/25/14 HPI: 73 year old female admitted 03/25/14 due to fall and dizziness. PMH significant for GERD. CT negative for acute findings. MRI pending. BSE ordered to evaluate swallow function and safety. Type of Study: Bedside swallow evaluation Previous Swallow Assessment: none Diet Prior to this Study: Dysphagia 3 (soft);Thin liquids Temperature Spikes Noted: No Respiratory Status: Room air History of Recent Intubation: No Behavior/Cognition: Alert;Cooperative;Pleasant mood Oral Cavity - Dentition: Adequate natural dentition Self-Feeding Abilities: Able to feed self Patient Positioning: Upright in bed Baseline Vocal Quality: Clear Volitional Cough: Weak Volitional Swallow: Able to elicit    Oral/Motor/Sensory Function Overall Oral Motor/Sensory Function: Appears within functional limits for tasks assessed   Ice Chips Ice chips: Within functional limits Presentation: Spoon   Thin Liquid Thin Liquid: Within functional limits Presentation: Straw    Nectar Thick Nectar Thick Liquid: Not tested   Honey Thick Honey Thick Liquid: Not tested   Puree Puree: Within functional limits Presentation: [place] B. Sihaam Chrobak, MSP, CCC-SLP [phone]  Solid: Within functional limits       Gayle Collard, [employer] 03/26/2014,10:55 AM

## 2014-03-26 NOTE — Progress Notes (Signed)
CONSULT NOTE  Date: 03/26/2014               Patient Name:  Tamara Stein MRN: [id number]  DOB: 11-04-1940 Age / Sex: 73 y.o., female        PCP: No primary provider on file. Primary Cardiologist: New / Nahser            Referring Physician: Ronnie Derby              Reason for Consult: CP , dizziness           History of Present Illness: Patient is a 73 y.o. female with a PMHx of cerebellar atrophy/degeneration, gastroesophageal reflux disease, hypertension, hyperlipidemia, who was admitted to [facility] on 03/25/2014 for evaluation of episode of dizziness and falling off her scooter associated with some chest pain.  The patient has a history of cerebellar degeneration.  She is to be a lot but has had profound dizziness and would fall frequently. She's not able to walk now .  She has frequent episodes of dizziness . She uses a motor scooter to get around. Today she had an episode of dizziness and actually fell off her scooter. The scooter fell on top of her. She then experienced some chest pain that she thinks is similar to her gastroesophageal reflux disease..   She is very comfortable at this point. EKG reveals very minimal T wave changes.   Medications: Outpatient medications: Prescriptions prior to admission  Medication Sig Dispense Refill  . aspirin EC 81 MG tablet Take 81 mg by mouth daily.      Marland Kitchen atorvastatin (LIPITOR) 40 MG tablet Take 40 mg by mouth at bedtime.      . Calcium Carbonate-Vitamin D (CALTRATE 600+D PO) Take 1 tablet by mouth daily.      . diazepam (VALIUM) 2 MG tablet Take 2 mg by mouth every 6 (six) hours as needed for anxiety.      . diclofenac (VOLTAREN) 75 MG EC tablet Take 75 mg by mouth 2 (two) times daily.      . fexofenadine (ALLEGRA) 180 MG tablet Take 180 mg by mouth daily.      Marland Kitchen levothyroxine (SYNTHROID, LEVOTHROID) 25 MCG tablet Take 25 mcg by mouth daily before breakfast.      . lisinopril-hydrochlorothiazide (PRINZIDE,ZESTORETIC) 10-12.5 MG  per tablet Take 1 tablet by mouth daily.      . meclizine (ANTIVERT) 25 MG tablet Take 25 mg by mouth 3 (three) times daily as needed for dizziness.      . Multiple Vitamin (MULTIVITAMIN WITH MINERALS) TABS tablet Take 1 tablet by mouth daily.      Marland Kitchen omeprazole (PRILOSEC) 40 MG capsule Take 40 mg by mouth daily.      . sertraline (ZOLOFT) 100 MG tablet Take 100 mg by mouth daily.        Current medications: Current Facility-Administered Medications  Medication Dose Route Frequency Provider Last Rate Last Dose  . 0.9 %  sodium chloride infusion   Intravenous Continuous Thurnell Lose, MD 75 mL/hr at 03/26/14 0429    . aspirin EC tablet 81 mg  81 mg Oral Daily Thurnell Lose, MD      . atorvastatin (LIPITOR) tablet 40 mg  40 mg Oral QHS Thurnell Lose, MD   40 mg at 03/25/14 2145  . diazepam (VALIUM) tablet 2 mg  2 mg Oral Q12H PRN Thurnell Lose, MD   2 mg at 03/25/14 2145  .  diclofenac (VOLTAREN) EC tablet 75 mg  75 mg Oral BID Thurnell Lose, MD   75 mg at 03/25/14 2145  . guaiFENesin-dextromethorphan (ROBITUSSIN DM) 100-10 MG/5ML syrup 5 mL  5 mL Oral Q4H PRN Thurnell Lose, MD      . heparin injection 5,000 Units  5,000 Units Subcutaneous 3 times per day Thurnell Lose, MD   5,000 Units at 03/26/14 0549  . levothyroxine (SYNTHROID, LEVOTHROID) tablet 25 mcg  25 mcg Oral QAC breakfast Thurnell Lose, MD   25 mcg at 03/26/14 0834  . meclizine (ANTIVERT) tablet 25 mg  25 mg Oral TID PRN Thurnell Lose, MD   25 mg at 03/25/14 2152  . multivitamin with minerals tablet 1 tablet  1 tablet Oral Daily Thurnell Lose, MD      . ondansetron (ZOFRAN) tablet 4 mg  4 mg Oral Q6H PRN Thurnell Lose, MD       Or  . ondansetron (ZOFRAN) injection 4 mg  4 mg Intravenous Q6H PRN Thurnell Lose, MD      . pantoprazole (PROTONIX) EC tablet 40 mg  40 mg Oral Daily Thurnell Lose, MD      . polyethylene glycol (MIRALAX / GLYCOLAX) packet 17 g  17 g Oral Daily PRN Thurnell Lose, MD       . sertraline (ZOLOFT) tablet 100 mg  100 mg Oral Daily Thurnell Lose, MD   100 mg at 03/25/14 1733  . sodium chloride 0.9 % injection 3 mL  3 mL Intravenous Q12H Thurnell Lose, MD         Allergies  Allergen Reactions  . Codeine     Knocks her out  . Penicillins Diarrhea  . Sulfa Antibiotics     Facial swelling      Past Medical History  Diagnosis Date  . GERD (gastroesophageal reflux disease)   . Hypertension   . Hyperlipidemia   . Cerebellar degeneration   . Chronic knee pain     History reviewed. No pertinent past surgical history.  History reviewed. No pertinent family history.  Social History:  reports that she has never smoked. She does not have any smokeless tobacco history on file. Her alcohol and drug histories are not on file.    Physical Exam: BP 142/72  Pulse 71  Temp(Src) 98 F (36.7 C) (Oral)  Resp 18  Ht 5' 2"  (1.575 m)  Wt 204 lb 9.4 oz (92.8 kg)  BMI 37.41 kg/m2  SpO2 97%  Wt Readings from Last 3 Encounters:  03/26/14 204 lb 9.4 oz (92.8 kg)    General: Vital signs reviewed and noted. Well-developed, well-nourished, in no acute distress; alert  Head: Normocephalic, atraumatic, sclera anicteric,   Neck: Supple. Negative for carotid bruits. No JVD   Lungs:  Clear bilaterally, no  wheezes, rales, or rhonchi. Breathing is normal   Heart: RRR with S1 S2. No murmurs, rubs, or gallops   Abdomen:  Soft, non-tender, non-distended with normoactive bowel sounds. No hepatomegaly. No rebound/guarding. No obvious abdominal masses   MSK: Strength and the appear normal for age.   Extremities: No clubbing or cyanosis. No edema.  Distal pedal pulses are 2+ and equal   Neurologic: Alert and oriented X 3.  Speech is somewhat slurred.  Gross motor movements are intact.   Psych: Responds to questions appropriately with a normal affect.     Lab results: Basic Metabolic Panel:  Recent Labs Lab 03/25/14 1340 03/26/14 0440  NA 143 139  K 4.4 3.9  CL  107 105  CO2 26 25  GLUCOSE 97 95  BUN 32* 27*  CREATININE 1.30* 1.25*  CALCIUM 9.1 8.8  MG 2.0  --     Liver Function Tests: No results found for this basename: AST, ALT, ALKPHOS, BILITOT, PROT, ALBUMIN,  in the last 168 hours No results found for this basename: LIPASE, AMYLASE,  in the last 168 hours No results found for this basename: AMMONIA,  in the last 168 hours  CBC:  Recent Labs Lab 03/25/14 1340 03/26/14 0440  WBC 4.7 5.0  NEUTROABS 2.6  --   HGB 10.4* 10.2*  HCT 33.0* 31.4*  MCV 95.9 95.4  PLT 147* 148*    Cardiac Enzymes:  Recent Labs Lab 03/25/14 1340  TROPONINI <0.30    BNP: No components found with this basename: POCBNP,   CBG: No results found for this basename: GLUCAP,  in the last 168 hours  Coagulation Studies: No results found for this basename: LABPROT, INR,  in the last 72 hours   Other results:  EKG:  NSR , NS T wave abn. Laterally       Assessment & Plan:  1. Chest discomfort: The patient's cardiac enzymes are negative. She thinks that the pains are similar to her gastroesophageal reflux pain.  At this point I would get an echocardiogram. Troponins are negative so far.  Will get 1 additional set this am  At this point I would be relatively conservative. She's not a great candidate for invasive procedures.  Episodes of dizziness are likely to be due to her cerebellar degeneration.  This seems to be gradually worsening.  2. Syncope: This may have been due to her dizziness. We will we'll monitor overnight to make sure that her rhythm stable.  Her telemetry reveals normal sinus rhythm without arrhythmias at this point.  Echo is being done currently. We will read it later today.  Will sign off.  From a cardiac standpoint, I think she is stable and can go home later today.   No specific cardiology follow up needed  Ramond Dial., MD, [facility] 03/26/2014, 8:48 AM Office - [phone] Pager [phone]

## 2014-03-26 NOTE — Progress Notes (Signed)
Bilateral carotid artery duplex completed:  1-39% ICA stenosis.  Vertebral artery flow is antegrade.     

## 2014-03-26 NOTE — Progress Notes (Signed)
Patient Demographics  Tamara Stein, is a 73 y.o. female, DOB - Apr 24, 1941, VOH:[id number]  Admit date - 03/25/2014   Admitting Physician Thurnell Lose, MD  Outpatient Primary MD for the patient is No primary provider on file.  LOS - 1   Chief Complaint  Patient presents with  . Fall  . Dizziness  . Nausea        Subjective:   Sarahlynn Cisnero today has, No headache, No chest pain, No abdominal pain - No Nausea, No new weakness tingling or numbness, No Cough - SOB. She is much better overall  Assessment & Plan    1. Syncope with loss of consciousness. 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 She has long-standing nystagmus, diplopia and dizziness. However she does have some EKG changes.   Table on telemetry with negative troponins, currently chest pain-free, pending MRI of the brain to rule out stroke, echogram carotid duplex, will check orthostatic blood pressures to rule out orthostasis as part of her autonomic instability.   Continue aspirin which he takes at home. She is chronically bedbound and wheelchair-bound. If workup negative outpatient neurology followup. She has a neurologist in [place].     Lab Results  Component Value Date   HGBA1C 5.9* 03/25/2014    Lab Results  Component Value Date   CHOL 161 03/25/2014   HDL 48 03/25/2014   LDLCALC 88 03/25/2014   TRIG 124 03/25/2014   CHOLHDL 3.4 03/25/2014    2. Intermittent chest pain with some EKG changes. Chest pain-free, -ve cycled troponin, stable on telemetry monitor,  pending echogram, ED has consulted cardiology.     3. Renal insufficiency. No previous labwork in the system, this could be her baseline however she is on her ARB as well, will hold ARB, improving with gentle  hydration, repeat BMP in the morning.     4. GERD. Continue PPI.     5. Dyslipidemia. On statin which will be continued.     6. Few month history of dysphagia. Her symptoms suggestive of esophageal stricture, pending evaluation by speech therapy, she might require barium swallow, for now dysphagia 3 diet, feeding assistance aspiration precautions. Based on our studies we might need to consult GI as well inpatient versus outpatient.     7. Hypothyroidism continue home dose Synthroid.     8. Depression anxiety. Not suicidal homicidal, appear stable, on Zoloft and benzodiazepine which will be continued at home dose.     Code Status: DO NOT RESUSCITATE  Family Communication: Brother in Sports coach  Disposition Plan: Home   Procedures CT head and C-spine, MRI brain, echogram, carotid duplex   Consults  cardiology   Medications  Scheduled Meds: . aspirin EC  81 mg Oral Daily  . atorvastatin  40 mg Oral QHS  . diclofenac  75 mg Oral BID  . heparin  5,000 Units Subcutaneous 3 times per day  . levothyroxine  25 mcg Oral QAC breakfast  . multivitamin with minerals  1 tablet Oral Daily  . pantoprazole  40 mg Oral Daily  . sertraline  100 mg Oral Daily  . sodium chloride  3 mL Intravenous Q12H   Continuous Infusions: . sodium chloride 75 mL/hr at 03/26/14 0429   PRN  Meds:.diazepam, guaiFENesin-dextromethorphan, meclizine, ondansetron (ZOFRAN) IV, ondansetron, polyethylene glycol  DVT Prophylaxis   Heparin    Lab Results  Component Value Date   PLT 148* 03/26/2014    Antibiotics   Anti-infectives   Start     Dose/Rate Route Frequency Ordered Stop   03/25/14 1515  cefTRIAXone (ROCEPHIN) 1 g in dextrose 5 % 50 mL IVPB     1 g 100 mL/hr over 30 Minutes Intravenous  Once 03/25/14 1503 03/25/14 1622          Objective:   Filed Vitals:   03/25/14 2335 03/26/14 0049 03/26/14 0549 03/26/14 0554  BP: 160/67 136/68 142/72   Pulse:  75 71   Temp:  97.6 F (36.4 C) 98  F (36.7 C)   TempSrc:  Oral Oral   Resp:  18 18   Height:      Weight:    92.8 kg (204 lb 9.4 oz)  SpO2:  98% 97%     Wt Readings from Last 3 Encounters:  03/26/14 92.8 kg (204 lb 9.4 oz)     Intake/Output Summary (Last 24 hours) at 03/26/14 1043 Last data filed at 03/26/14 0827  Gross per 24 hour  Intake    700 ml  Output    200 ml  Net    500 ml     Physical Exam  Awake Alert, Oriented X 3, No new F.N deficits, Normal affect [place].AT,PERRAL, +ve nystagmus Supple Neck,No JVD, No cervical lymphadenopathy appriciated.  Symmetrical Chest wall movement, Good air movement bilaterally, CTAB RRR,No Gallops,Rubs or new Murmurs, No Parasternal Heave +ve B.Sounds, Abd Soft, No tenderness, No organomegaly appriciated, No rebound - guarding or rigidity. No Cyanosis, Clubbing or edema, No new Rash or bruise     Data Review   Micro Results No results found for this or any previous visit (from the past 240 hour(s)).  Radiology Reports Dg Chest 2 View  03/25/2014   CLINICAL DATA:  Syncope.  EXAM: CHEST  2 VIEW  COMPARISON:  None.  FINDINGS: Heart size is mildly enlarged but there is no pulmonary edema. Lungs are clear. Prominent hiatal hernia is noted. No pneumothorax or pleural effusion. Degenerative change is present about the shoulders.  IMPRESSION: Bowel cardiomegaly without acute disease.  Hiatal hernia.   Electronically Signed   By: Inge Rise M.D.   On: 03/25/2014 15:01   Ct Head Wo Contrast  03/25/2014   CLINICAL DATA:  Fall.  Dizziness and nausea.  EXAM: CT HEAD WITHOUT CONTRAST  CT CERVICAL SPINE WITHOUT CONTRAST  TECHNIQUE: Multidetector CT imaging of the head and cervical spine was performed following the standard protocol without intravenous contrast. Multiplanar CT image reconstructions of the cervical spine were also generated.  COMPARISON:  None.  FINDINGS: CT HEAD FINDINGS  Ventricles are normal in configuration. There is ventricular and sulcal enlargement reflecting  mild to moderate atrophy. Cerebellar atrophy is more prominent. Patchy white matter hypoattenuation is noted consistent with moderate chronic microvascular ischemic change. There are no parenchymal masses or mass effect. There is no evidence of a cortical infarct.  There are no extra-axial masses or abnormal fluid collections.  No intracranial hemorrhage  Visualized sinuses and mastoid air cells are clear. No skull lesion or fracture.  CT CERVICAL SPINE FINDINGS  No fracture. There is a grade 1 anterolisthesis of C4 on C5 that appears degenerative in origin. There is mild loss of disc height at C4-C5 with moderate loss disc height at C5-C6 where there is also endplate  irregularity, sclerosis and spurring. Facet degenerative changes greatest on the right at C3-C4 and on the left from C2-C3 through C6-C7.  Bones are demineralized. Soft tissues show vascular calcifications. There is no mass or adenopathy.  Lung apices show areas of interstitial prominence and mild scarring.  IMPRESSION: HEAD CT:  No acute intracranial abnormalities.  No skull fracture.  CERVICAL CT:  No fracture or acute finding.   Electronically Signed   By: Lajean Manes M.D.   On: 03/25/2014 14:56   Ct Cervical Spine Wo Contrast  03/25/2014   CLINICAL DATA:  Fall.  Dizziness and nausea.  EXAM: CT HEAD WITHOUT CONTRAST  CT CERVICAL SPINE WITHOUT CONTRAST  TECHNIQUE: Multidetector CT imaging of the head and cervical spine was performed following the standard protocol without intravenous contrast. Multiplanar CT image reconstructions of the cervical spine were also generated.  COMPARISON:  None.  FINDINGS: CT HEAD FINDINGS  Ventricles are normal in configuration. There is ventricular and sulcal enlargement reflecting mild to moderate atrophy. Cerebellar atrophy is more prominent. Patchy white matter hypoattenuation is noted consistent with moderate chronic microvascular ischemic change. There are no parenchymal masses or mass effect. There is no  evidence of a cortical infarct.  There are no extra-axial masses or abnormal fluid collections.  No intracranial hemorrhage  Visualized sinuses and mastoid air cells are clear. No skull lesion or fracture.  CT CERVICAL SPINE FINDINGS  No fracture. There is a grade 1 anterolisthesis of C4 on C5 that appears degenerative in origin. There is mild loss of disc height at C4-C5 with moderate loss disc height at C5-C6 where there is also endplate irregularity, sclerosis and spurring. Facet degenerative changes greatest on the right at C3-C4 and on the left from C2-C3 through C6-C7.  Bones are demineralized. Soft tissues show vascular calcifications. There is no mass or adenopathy.  Lung apices show areas of interstitial prominence and mild scarring.  IMPRESSION: HEAD CT:  No acute intracranial abnormalities.  No skull fracture.  CERVICAL CT:  No fracture or acute finding.   Electronically Signed   By: Lajean Manes M.D.   On: 03/25/2014 14:56    CBC  Recent Labs Lab 03/25/14 1340 03/26/14 0440  WBC 4.7 5.0  HGB 10.4* 10.2*  HCT 33.0* 31.4*  PLT 147* 148*  MCV 95.9 95.4  MCH 30.2 31.0  MCHC 31.5 32.5  RDW 13.4 13.4  LYMPHSABS 1.3  --   MONOABS 0.4  --   EOSABS 0.3  --   BASOSABS 0.1  --     Chemistries   Recent Labs Lab 03/25/14 1340 03/26/14 0440  NA 143 139  K 4.4 3.9  CL 107 105  CO2 26 25  GLUCOSE 97 95  BUN 32* 27*  CREATININE 1.30* 1.25*  CALCIUM 9.1 8.8  MG 2.0  --    ------------------------------------------------------------------------------------------------------------------ estimated creatinine clearance is 41.9 ml/min (by C-G formula based on Cr of 1.25). ------------------------------------------------------------------------------------------------------------------  Recent Labs  03/25/14 1340  HGBA1C 5.9*   ------------------------------------------------------------------------------------------------------------------  Recent Labs  03/25/14 1340  CHOL  161  HDL 48  LDLCALC 88  TRIG 124  CHOLHDL 3.4   ------------------------------------------------------------------------------------------------------------------ No results found for this basename: TSH, T4TOTAL, FREET3, T3FREE, THYROIDAB,  in the last 72 hours ------------------------------------------------------------------------------------------------------------------ No results found for this basename: VITAMINB12, FOLATE, FERRITIN, TIBC, IRON, RETICCTPCT,  in the last 72 hours  Coagulation profile No results found for this basename: INR, PROTIME,  in the last 168 hours  No results found for this basename: DDIMER,  in the last 72 hours  Cardiac Enzymes  Recent Labs Lab 03/25/14 1340 03/26/14 1001  TROPONINI <0.30 <0.30   ------------------------------------------------------------------------------------------------------------------ No components found with this basename: POCBNP,      Time Spent in minutes   35   Lala Lund K M.D on 03/26/2014 at 10:43 AM  Between 7am to 7pm - Pager - [phone]  After 7pm go to [email or web address] - password [email or web address]  And look for the night coverage person covering for me after hours  [facility] Office  [phone]   **Disclaimer: This note may have been dictated with voice recognition software. Similar sounding words can inadvertently be transcribed and this note may contain transcription errors which may not have been corrected upon publication of note.**

## 2014-03-26 NOTE — Progress Notes (Signed)
Echocardiogram 2D Echocardiogram has been performed.  Tamara Stein 03/26/2014, 9:04 AM

## 2014-03-27 LAB — URINE CULTURE: Colony Count: >=100000

## 2014-03-27 LAB — BASIC METABOLIC PANEL
Anion gap: 11 (ref 5–15)
BUN: 25 mg/dL — AB (ref 6–23)
CALCIUM: 8.3 mg/dL — AB (ref 8.4–10.5)
CO2: 23 meq/L (ref 19–32)
Chloride: 108 mEq/L (ref 96–112)
Creatinine, Ser: 1.36 mg/dL — ABNORMAL HIGH (ref 0.50–1.10)
GFR calc Af Amer: 43 mL/min — ABNORMAL LOW (ref >90)
GFR, EST NON AFRICAN AMERICAN: 37 mL/min — AB (ref >90)
GLUCOSE: 99 mg/dL (ref 70–99)
Potassium: 4 mEq/L (ref 3.7–5.3)
Sodium: 142 mEq/L (ref 137–147)

## 2014-03-27 LAB — CREATININE, URINE, RANDOM: CREATININE, URINE: 82.3 mg/dL

## 2014-03-27 LAB — SODIUM, URINE, RANDOM: SODIUM UR: 150 meq/L

## 2014-03-27 MED ORDER — CEFTRIAXONE SODIUM 1 G IJ SOLR
1.0000 g | INTRAMUSCULAR | Status: DC
  Administered 2014-03-27: 1 g via INTRAVENOUS
  Filled 2014-03-27: qty 10

## 2014-03-27 MED ORDER — CEFUROXIME AXETIL 500 MG PO TABS: 500.0000 mg | ORAL_TABLET | Freq: Two times a day (BID) | ORAL | Status: DC

## 2014-03-27 NOTE — Clinical Documentation Improvement (Addendum)
Pt with 1-39% ICA stenosis per carotid artery duplex.  Please clarify if you agree pt with  ICA stenosis or other diagnosis and document in pn or d/c summary    Possible Clinical Conditions?      ICA stenosis  TIA                                Other Condition___________________                  Cannot Clinically Determine_________    This is a normal finding.    Supporting Information: Risk Factors:  Syncope, Cerebellar degeneration,  Bilateral carotid artery duplex completed: 1-39% ICA stenosis. Per RVT note 03/26/14  Treatment:  atorvastatin (LIPITOR) tablet 40 mg     Thank You, Heloise Beecham ,RN Clinical Documentation Specialist:  [place] [employer]

## 2014-03-27 NOTE — Discharge Instructions (Signed)
Follow with Primary MD and your Neurologist in 7 days   Get CBC, CMP  checked  by Primary MD next visit.    Activity: As tolerated with Full fall precautions use walker/cane & assistance as needed   Disposition Home     Diet: Heart Healthy Dysphagia 3 with full aspiration precautions if needed.  For Heart failure patients - Check your Weight same time everyday, if you gain over 2 pounds, or you develop in leg swelling, experience more shortness of breath or chest pain, call your Primary MD immediately. Follow Cardiac Low Salt Diet and 1.8 lit/day fluid restriction.   On your next visit with her primary care physician please Get Medicines reviewed and adjusted.  Please request your Prim.MD to go over all Hospital Tests and Procedure/Radiological results at the follow up, please get all Hospital records sent to your Prim MD by signing hospital release before you go home.   If you experience worsening of your admission symptoms, develop shortness of breath, life threatening emergency, suicidal or homicidal thoughts you must seek medical attention immediately by calling 911 or calling your MD immediately  if symptoms less severe.  You Must read complete instructions/literature along with all the possible adverse reactions/side effects for all the Medicines you take and that have been prescribed to you. Take any new Medicines after you have completely understood and accpet all the possible adverse reactions/side effects.   Do not drive, operating heavy machinery, perform activities at heights, swimming or participation in water activities or provide baby sitting services if your were admitted for syncope or siezures until you have seen by Primary MD or a Neurologist and advised to do so again.  Do not drive when taking Pain medications.    Do not take more than prescribed Pain, Sleep and Anxiety Medications  Special Instructions: If you have smoked or chewed Tobacco  in the last 2 yrs  please stop smoking, stop any regular Alcohol  and or any Recreational drug use.  Wear Seat belts while driving.   Please note  You were cared for by a hospitalist during your hospital stay. If you have any questions about your discharge medications or the care you received while you were in the hospital after you are discharged, you can call the unit and asked to speak with the hospitalist on call if the hospitalist that took care of you is not available. Once you are discharged, your primary care physician will handle any further medical issues. Please note that NO REFILLS for any discharge medications will be authorized once you are discharged, as it is imperative that you return to your primary care physician (or establish a relationship with a primary care physician if you do not have one) for your aftercare needs so that they can reassess your need for medications and monitor your lab values.

## 2014-03-27 NOTE — Clinical Documentation Improvement (Signed)
Please clarify if Renal insufficiency in setting of abnormal BUN/CR/GFR=32/1.30/39 can be further specified as one of the diagnoses listed below and document in pn or d/c summary.   Possible Clinical Conditions?   Acute Renal Failure/Acute Kidney Injury Acute Tubular Necrosis Acute Renal Cortical Necrosis Acute Renal Medullary Necrosis Acute on Chronic Renal Failure Chronic Renal Failure Other Condition Cannot Clinically Determine   Supporting Information: Risk Factors: Syncope, Cerebellar degeneration, CP, GERD Signs and Symptoms: Diagnostics: Treatments: 0.9 % sodium chloride infusion   Thank You, Heloise Beecham ,RN Clinical Documentation Specialist:  [place] [employer]

## 2014-03-27 NOTE — Discharge Summary (Signed)
Tamara Stein, is a 73 y.o. female  DOB 1941/07/10  MRN [id number].  Admission date:  03/25/2014  Admitting Physician  Thurnell Lose, MD  Discharge Date:  03/27/2014   Primary MD  No primary provider on file.  Recommendations for primary care physician for things to follow:   Repeat CBC BMP in 3-7 days.   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 GERD (gastroesophageal reflux disease)   . Hypertension   . Hyperlipidemia   . Cerebellar degeneration   . Chronic knee pain     Past Surgical History  Procedure Laterality Date  . Cholecystectomy    . Abdominal hysterectomy    . Right elbow         History of present illness and  Hospital Course:     Kindly see H&P for history of present illness and admission details, please review complete Labs, Consult reports and Test reports for all details in brief  HPI  from the history and physical done on the day of admission   Tamara Stein is a 73 y.o. female, With H/O HTN, Dyslipidemia, GERD, Cerebellar Degeneration, Nystagmus, Dizziness,  Dysphagia, who uses a scooter to ambulate, who is in her driveway on the scooter when she passed out and lost consciousness for a few minutes, she fell on the floor, no seizure-like activity, no bowel bladder incontinence or tongue bite. She has had dizziness for several years and attributes that to her cerebellar degeneration. Came to the ER after the syncopal episode, in the ER a sensual workup which included blood work, head and neck CT scan, chest x-ray and UA were unremarkable. EKG showed some T-wave inversion in lead 1 and aVL, she does report some intermittent chest pain but none today.  Was called to admit the patient for syncope workup.    Hospital Course     1. Syncope with loss of consciousness. 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 She has long-standing nystagmus, diplopia and dizziness. However she does have some EKG changes.   She was stable on telemetry, echogram stable with no wall motion abnormalities, chest pain-free this admission, 3 sets of troponin negative, EKG had nonspecific changes, seen by cardiology and cleared for discharge. Ruled out acute stroke with a negative CT head and MRI brain. Carotid duplex and echo gram unremarkable as well. Likely syncopal episode combination of underlying cerebellar degeneration along with UTI and mild dehydration. She was not orthostatic.  Now symptom-free at baseline eager to go home and will follow with PCP and neurologist outpatient postdischarge in a timely fashion.    Lab Results   Component  Value  Date    HGBA1C  5.9*  03/25/2014      Lab Results   Component  Value  Date    CHOL  161  03/25/2014    HDL  48  03/25/2014    LDLCALC  88  03/25/2014    TRIG  124  03/25/2014    CHOLHDL  3.4  03/25/2014       2. Intermittent chest pain with some EKG changes. Chest pain-free, -ve cycled troponin, stable on telemetry monitor, pending echogram, seen by cardiology no further workup.     3. Renal  insufficiency. No previous labwork in the system, this could be her baseline however she is on her ARB as well, will hold ARB and home dose diuretic, improved with gentle hydration outpatient followup with PCP for continued monitoring of BMP and diuretic need.    4. GERD. Continue PPI.     5. Dyslipidemia. On statin which will be continued.     6. Few month history of dysphagia.  Seen by speech and placed on dysphagia 3 diet, tolerating it well, will recommend outpatient speech evaluation and if required GI evaluation.     7. Hypothyroidism continue home dose Synthroid.     8. Depression anxiety. Not suicidal homicidal, appear stable, on Zoloft and benzodiazepine which will be continued at home dose.     9.UTI - Ceftin for few more days.      Discharge Condition: stable   Follow UP  Follow-up Information   Follow up with your PCP and Neurologist. Schedule an appointment as soon as possible for a visit in 3 days.        Discharge Instructions  and  Discharge Medications     Discharge Instructions   Discharge instructions    Complete by:  As directed   Follow with Primary MD and your Neurologist in 7 days   Get CBC, CMP  checked  by Primary MD next visit.    Activity: As tolerated with Full fall precautions use walker/cane & assistance as needed   Disposition Home     Diet: Heart Healthy Dysphagia 3 with full aspiration precautions if needed.  For Heart failure patients - Check your Weight same time everyday, if you gain over 2 pounds, or you develop in leg swelling, experience more shortness of breath or chest pain, call your Primary MD immediately. Follow Cardiac Low Salt Diet and 1.8 lit/day fluid restriction.   On your next visit with her primary care physician please Get Medicines reviewed and adjusted.  Please request your Prim.MD to go over all Hospital Tests and Procedure/Radiological results at the follow up, please get all Hospital records sent  to your Prim MD by signing hospital release before you go home.   If you experience worsening of your admission symptoms, develop shortness of breath, life threatening emergency, suicidal or homicidal thoughts you must seek medical attention immediately by calling 911 or calling your MD immediately  if symptoms less severe.  You Must read complete instructions/literature along with all the possible adverse reactions/side effects for all the Medicines you take and that have been prescribed to you. Take any new Medicines after you have completely understood and accpet all the possible adverse reactions/side effects.   Do not drive, operating heavy machinery, perform activities at heights, swimming or  participation in water activities or provide baby sitting services if your were admitted for syncope or siezures until you have seen by Primary MD or a Neurologist and advised to do so again.  Do not drive when taking Pain medications.    Do not take more than prescribed Pain, Sleep and Anxiety Medications  Special Instructions: If you have smoked or chewed Tobacco  in the last 2 yrs please stop smoking, stop any regular Alcohol  and or any Recreational drug use.  Wear Seat belts while driving.   Please note  You were cared for by a hospitalist during your hospital stay. If you have any questions about your discharge medications or the care you received while you were in the hospital after you are discharged, you can call the unit and asked to speak with the hospitalist on call if the hospitalist that took care of you is not available. Once you are discharged, your primary care physician will handle any further medical issues. Please note that NO REFILLS for any discharge medications will be authorized once you are discharged, as it is imperative that you return to your primary care physician (or establish a relationship with a primary care physician if you do not have one) for your aftercare needs so  that they can reassess your need for medications and monitor your lab values.     Increase activity slowly    Complete by:  As directed             Medication List    STOP taking these medications       lisinopril-hydrochlorothiazide 10-12.5 MG per tablet  Commonly known as:  PRINZIDE,ZESTORETIC      TAKE these medications       aspirin EC 81 MG tablet  Take 81 mg by mouth daily.     atorvastatin 40 MG tablet  Commonly known as:  LIPITOR  Take 40 mg by mouth at bedtime.     CALTRATE 600+D PO  Take 1 tablet by mouth daily.     cefUROXime 500 MG tablet  Commonly known as:  CEFTIN  Take 1 tablet (500 mg total) by mouth 2 (two) times daily with a meal.     diazepam 2 MG tablet  Commonly known as:  VALIUM  Take 2 mg by mouth every 6 (six) hours as needed for anxiety.     diclofenac 75 MG EC tablet  Commonly known as:  VOLTAREN  Take 75 mg by mouth 2 (two) times daily.     fexofenadine 180 MG tablet  Commonly known as:  ALLEGRA  Take 180 mg by mouth daily.     levothyroxine 25 MCG tablet  Commonly known as:  SYNTHROID, LEVOTHROID  Take 25 mcg by mouth daily before breakfast.     meclizine 25 MG tablet  Commonly known as:  ANTIVERT  Take 25 mg by mouth 3 (three) times daily as needed for dizziness.     multivitamin with minerals Tabs tablet  Take 1 tablet by mouth daily.     omeprazole 40 MG capsule  Commonly known as:  PRILOSEC  Take 40 mg by mouth daily.     sertraline 100 MG tablet  Commonly known as:  ZOLOFT  Take 100 mg by mouth daily.          Diet and Activity recommendation: See Discharge Instructions above   Consults obtained - None   Major procedures and Radiology Reports - PLEASE review detailed and final reports for all  details, in brief -   Carotids  Bilateral carotid artery duplex completed: 1-39% ICA stenosis. Vertebral artery flow is antegrade.     TTE  - Left ventricle: The cavity size was normal. Systolic function  was normal. The estimated ejection fraction was in the range of 55% to 65%. Wall motion was normal; there were no regional wall motion abnormalities. Left ventricular diastolic function parameters were normal. - Aortic valve: There was trivial regurgitation. Valve area (Vmax): 1.64 cm^2. - Tricuspid valve: There was mild-moderate regurgitation.    Dg Chest 2 View  03/25/2014   CLINICAL DATA:  Syncope.  EXAM: CHEST  2 VIEW  COMPARISON:  None.  FINDINGS: Heart size is mildly enlarged but there is no pulmonary edema. Lungs are clear. Prominent hiatal hernia is noted. No pneumothorax or pleural effusion. Degenerative change is present about the shoulders.  IMPRESSION: Bowel cardiomegaly without acute disease.  Hiatal hernia.   Electronically Signed   By: Inge Rise M.D.   On: 03/25/2014 15:01   Ct Head Wo Contrast  03/25/2014   CLINICAL DATA:  Fall.  Dizziness and nausea.  EXAM: CT HEAD WITHOUT CONTRAST  CT CERVICAL SPINE WITHOUT CONTRAST  TECHNIQUE: Multidetector CT imaging of the head and cervical spine was performed following the standard protocol without intravenous contrast. Multiplanar CT image reconstructions of the cervical spine were also generated.  COMPARISON:  None.  FINDINGS: CT HEAD FINDINGS  Ventricles are normal in configuration. There is ventricular and sulcal enlargement reflecting mild to moderate atrophy. Cerebellar atrophy is more prominent. Patchy white matter hypoattenuation is noted consistent with moderate chronic microvascular ischemic change. There are no parenchymal masses or mass effect. There is no evidence of a cortical infarct.  There are no extra-axial masses or abnormal fluid collections.  No intracranial hemorrhage  Visualized sinuses and mastoid air cells are clear. No skull lesion or fracture.  CT CERVICAL SPINE FINDINGS  No fracture. There is a grade 1 anterolisthesis of C4 on C5 that appears degenerative in origin. There is mild loss of disc height at C4-C5  with moderate loss disc height at C5-C6 where there is also endplate irregularity, sclerosis and spurring. Facet degenerative changes greatest on the right at C3-C4 and on the left from C2-C3 through C6-C7.  Bones are demineralized. Soft tissues show vascular calcifications. There is no mass or adenopathy.  Lung apices show areas of interstitial prominence and mild scarring.  IMPRESSION: HEAD CT:  No acute intracranial abnormalities.  No skull fracture.  CERVICAL CT:  No fracture or acute finding.   Electronically Signed   By: Lajean Manes M.D.   On: 03/25/2014 14:56   Ct Cervical Spine Wo Contrast  03/25/2014   CLINICAL DATA:  Fall.  Dizziness and nausea.  EXAM: CT HEAD WITHOUT CONTRAST  CT CERVICAL SPINE WITHOUT CONTRAST  TECHNIQUE: Multidetector CT imaging of the head and cervical spine was performed following the standard protocol without intravenous contrast. Multiplanar CT image reconstructions of the cervical spine were also generated.  COMPARISON:  None.  FINDINGS: CT HEAD FINDINGS  Ventricles are normal in configuration. There is ventricular and sulcal enlargement reflecting mild to moderate atrophy. Cerebellar atrophy is more prominent. Patchy white matter hypoattenuation is noted consistent with moderate chronic microvascular ischemic change. There are no parenchymal masses or mass effect. There is no evidence of a cortical infarct.  There are no extra-axial masses or abnormal fluid collections.  No intracranial hemorrhage  Visualized sinuses and mastoid air cells are clear. No skull lesion  or fracture.  CT CERVICAL SPINE FINDINGS  No fracture. There is a grade 1 anterolisthesis of C4 on C5 that appears degenerative in origin. There is mild loss of disc height at C4-C5 with moderate loss disc height at C5-C6 where there is also endplate irregularity, sclerosis and spurring. Facet degenerative changes greatest on the right at C3-C4 and on the left from C2-C3 through C6-C7.  Bones are demineralized.  Soft tissues show vascular calcifications. There is no mass or adenopathy.  Lung apices show areas of interstitial prominence and mild scarring.  IMPRESSION: HEAD CT:  No acute intracranial abnormalities.  No skull fracture.  CERVICAL CT:  No fracture or acute finding.   Electronically Signed   By: Lajean Manes M.D.   On: 03/25/2014 14:56   Mr Brain Wo Contrast  03/26/2014   CLINICAL DATA:  Syncope, dizziness, and nausea. History of cerebellar degeneration.  EXAM: MRI HEAD WITHOUT CONTRAST  TECHNIQUE: Multiplanar, multiecho pulse sequences of the brain and surrounding structures were obtained without intravenous contrast.  COMPARISON:  Head CT 03/25/2014  FINDINGS: There is no evidence of acute infarct, intracranial hemorrhage, mass, midline shift, or extra-axial fluid collection. There is mild to moderate cerebral and marked cerebellar atrophy. Patchy T2 hyperintensities in the subcortical and deep cerebral white matter and pons are nonspecific but compatible with mild to moderate chronic small vessel ischemic disease.  Prior bilateral cataract extraction is noted. Paranasal sinuses are clear. There is a small left mastoid effusion. Major intracranial vascular flow voids are preserved. Visualized portion of the upper cervical spine demonstrates grade 1 anterolisthesis of C3 on C4 and C4 on C5.  IMPRESSION: 1. No acute intracranial abnormality. 2. Mild-to-moderate chronic small vessel ischemic disease. 3. Marked cerebellar atrophy.   Electronically Signed   By: Logan Bores   On: 03/26/2014 11:45    Micro Results      Recent Results (from the past 240 hour(s))  URINE CULTURE     Status: None   Collection Time    03/25/14  3:15 PM      Result Value Ref Range Status   Specimen Description URINE, CATHETERIZED   Final   Special Requests NONE   Final   Culture  Setup Time     Final   Value: 03/25/2014 22:03     Performed at [facility]     Final   Value: >=100,000 COLONIES/ML      Performed at [facility]   Culture     Final   Value: Multiple bacterial morphotypes present, none predominant. Suggest appropriate recollection if clinically indicated.     Performed at [facility]   Report Status 03/27/2014 FINAL   Final       Today   Subjective:   Tamara Stein today has no headache,no chest abdominal pain,no new weakness tingling or numbness, feels much better wants to go home today.   Objective:   Blood pressure 121/57, pulse 63, temperature 98.5 F (36.9 C), temperature source Oral, resp. rate 18, height 5' 2"  (1.575 m), weight 95.5 kg (210 lb 8.6 oz), SpO2 95.00%.   Intake/Output Summary (Last 24 hours) at 03/27/14 0916 Last data filed at 03/27/14 0700  Gross per 24 hour  Intake   1080 ml  Output    450 ml  Net    630 ml    Exam Awake Alert, Oriented x 3, No new F.N deficits, Normal affect [place].AT,PERRAL Supple Neck,No JVD, No cervical lymphadenopathy appriciated.  Symmetrical Chest wall movement, Good air movement bilaterally, CTAB RRR,No Gallops,Rubs or new Murmurs, No Parasternal Heave +ve B.Sounds, Abd Soft, Non tender, No organomegaly appriciated, No rebound -guarding or rigidity. No Cyanosis, Clubbing or edema, No new Rash or bruise  Data Review   CBC w Diff: Lab Results  Component Value Date   WBC 5.0 03/26/2014   HGB 10.2* 03/26/2014   HCT 31.4* 03/26/2014   PLT 148* 03/26/2014   LYMPHOPCT 27 03/25/2014   MONOPCT 9 03/25/2014   EOSPCT 7* 03/25/2014   BASOPCT 2* 03/25/2014    CMP: Lab Results  Component Value Date   NA 142 03/27/2014   K 4.0 03/27/2014   CL 108 03/27/2014   CO2 23 03/27/2014   BUN 25* 03/27/2014   CREATININE 1.36* 03/27/2014  .   Total Time in preparing paper work, data evaluation and todays exam - 35 minutes  Thurnell Lose M.D on 03/27/2014 at 9:16 AM  [facility] Office  [phone]   **Disclaimer: This note may have been dictated with voice recognition software.  Similar sounding words can inadvertently be transcribed and this note may contain transcription errors which may not have been corrected upon publication of note.**

## 2014-03-27 NOTE — Progress Notes (Signed)
Pt selected [facility] for [facility]. Referral given to in house rep.

## 2014-03-29 DIAGNOSIS — I10 Essential (primary) hypertension: Secondary | ICD-10-CM | POA: Diagnosis not present

## 2014-03-29 DIAGNOSIS — R55 Syncope and collapse: Secondary | ICD-10-CM | POA: Diagnosis not present

## 2014-03-29 DIAGNOSIS — G319 Degenerative disease of nervous system, unspecified: Secondary | ICD-10-CM | POA: Diagnosis not present

## 2014-03-29 DIAGNOSIS — R471 Dysarthria and anarthria: Secondary | ICD-10-CM | POA: Diagnosis not present

## 2014-03-30 DIAGNOSIS — N390 Urinary tract infection, site not specified: Secondary | ICD-10-CM | POA: Diagnosis not present

## 2014-03-30 DIAGNOSIS — R42 Dizziness and giddiness: Secondary | ICD-10-CM | POA: Diagnosis not present

## 2014-03-31 DIAGNOSIS — R471 Dysarthria and anarthria: Secondary | ICD-10-CM | POA: Diagnosis not present

## 2014-03-31 DIAGNOSIS — G319 Degenerative disease of nervous system, unspecified: Secondary | ICD-10-CM | POA: Diagnosis not present

## 2014-03-31 DIAGNOSIS — R55 Syncope and collapse: Secondary | ICD-10-CM | POA: Diagnosis not present

## 2014-03-31 DIAGNOSIS — I10 Essential (primary) hypertension: Secondary | ICD-10-CM | POA: Diagnosis not present

## 2014-04-01 DIAGNOSIS — I10 Essential (primary) hypertension: Secondary | ICD-10-CM | POA: Diagnosis not present

## 2014-04-01 DIAGNOSIS — R55 Syncope and collapse: Secondary | ICD-10-CM | POA: Diagnosis not present

## 2014-04-01 DIAGNOSIS — G319 Degenerative disease of nervous system, unspecified: Secondary | ICD-10-CM | POA: Diagnosis not present

## 2014-04-01 DIAGNOSIS — R471 Dysarthria and anarthria: Secondary | ICD-10-CM | POA: Diagnosis not present

## 2014-04-03 DIAGNOSIS — G319 Degenerative disease of nervous system, unspecified: Secondary | ICD-10-CM | POA: Diagnosis not present

## 2014-04-03 DIAGNOSIS — I10 Essential (primary) hypertension: Secondary | ICD-10-CM | POA: Diagnosis not present

## 2014-04-03 DIAGNOSIS — R471 Dysarthria and anarthria: Secondary | ICD-10-CM | POA: Diagnosis not present

## 2014-04-03 DIAGNOSIS — R55 Syncope and collapse: Secondary | ICD-10-CM | POA: Diagnosis not present

## 2014-04-04 DIAGNOSIS — G319 Degenerative disease of nervous system, unspecified: Secondary | ICD-10-CM | POA: Diagnosis not present

## 2014-04-04 DIAGNOSIS — R55 Syncope and collapse: Secondary | ICD-10-CM | POA: Diagnosis not present

## 2014-04-04 DIAGNOSIS — R471 Dysarthria and anarthria: Secondary | ICD-10-CM | POA: Diagnosis not present

## 2014-04-04 DIAGNOSIS — I10 Essential (primary) hypertension: Secondary | ICD-10-CM | POA: Diagnosis not present

## 2014-04-06 DIAGNOSIS — G319 Degenerative disease of nervous system, unspecified: Secondary | ICD-10-CM | POA: Diagnosis not present

## 2014-04-06 DIAGNOSIS — R471 Dysarthria and anarthria: Secondary | ICD-10-CM | POA: Diagnosis not present

## 2014-04-06 DIAGNOSIS — R55 Syncope and collapse: Secondary | ICD-10-CM | POA: Diagnosis not present

## 2014-04-06 DIAGNOSIS — I10 Essential (primary) hypertension: Secondary | ICD-10-CM | POA: Diagnosis not present

## 2014-04-07 DIAGNOSIS — G319 Degenerative disease of nervous system, unspecified: Secondary | ICD-10-CM | POA: Diagnosis not present

## 2014-04-07 DIAGNOSIS — I10 Essential (primary) hypertension: Secondary | ICD-10-CM | POA: Diagnosis not present

## 2014-04-07 DIAGNOSIS — R55 Syncope and collapse: Secondary | ICD-10-CM | POA: Diagnosis not present

## 2014-04-07 DIAGNOSIS — R471 Dysarthria and anarthria: Secondary | ICD-10-CM | POA: Diagnosis not present

## 2014-04-08 ENCOUNTER — Emergency Department (HOSPITAL_COMMUNITY): Payer: Medicare Other

## 2014-04-08 ENCOUNTER — Emergency Department (HOSPITAL_COMMUNITY)
Admission: EM | Admit: 2014-04-08 | Discharge: 2014-04-08 | Disposition: A | Discharge status: Home / Self Care | Payer: Medicare Other | Attending: Emergency Medicine | Admitting: Emergency Medicine

## 2014-04-08 DIAGNOSIS — W050XXA Fall from non-moving wheelchair, initial encounter: Secondary | ICD-10-CM | POA: Diagnosis not present

## 2014-04-08 DIAGNOSIS — Z791 Long term (current) use of non-steroidal anti-inflammatories (NSAID): Secondary | ICD-10-CM | POA: Insufficient documentation

## 2014-04-08 DIAGNOSIS — I10 Essential (primary) hypertension: Secondary | ICD-10-CM | POA: Insufficient documentation

## 2014-04-08 DIAGNOSIS — K219 Gastro-esophageal reflux disease without esophagitis: Secondary | ICD-10-CM | POA: Diagnosis not present

## 2014-04-08 DIAGNOSIS — G8929 Other chronic pain: Secondary | ICD-10-CM | POA: Insufficient documentation

## 2014-04-08 DIAGNOSIS — Y929 Unspecified place or not applicable: Secondary | ICD-10-CM | POA: Insufficient documentation

## 2014-04-08 DIAGNOSIS — Z7982 Long term (current) use of aspirin: Secondary | ICD-10-CM | POA: Diagnosis not present

## 2014-04-08 DIAGNOSIS — S99919A Unspecified injury of unspecified ankle, initial encounter: Secondary | ICD-10-CM

## 2014-04-08 DIAGNOSIS — Z880 Allergy status to penicillin: Secondary | ICD-10-CM | POA: Insufficient documentation

## 2014-04-08 DIAGNOSIS — Z79899 Other long term (current) drug therapy: Secondary | ICD-10-CM | POA: Insufficient documentation

## 2014-04-08 DIAGNOSIS — M79609 Pain in unspecified limb: Secondary | ICD-10-CM | POA: Diagnosis not present

## 2014-04-08 DIAGNOSIS — Z8669 Personal history of other diseases of the nervous system and sense organs: Secondary | ICD-10-CM | POA: Insufficient documentation

## 2014-04-08 DIAGNOSIS — S8990XA Unspecified injury of unspecified lower leg, initial encounter: Secondary | ICD-10-CM | POA: Insufficient documentation

## 2014-04-08 DIAGNOSIS — S99929A Unspecified injury of unspecified foot, initial encounter: Secondary | ICD-10-CM

## 2014-04-08 DIAGNOSIS — S8010XA Contusion of unspecified lower leg, initial encounter: Secondary | ICD-10-CM | POA: Insufficient documentation

## 2014-04-08 DIAGNOSIS — Y9389 Activity, other specified: Secondary | ICD-10-CM | POA: Diagnosis not present

## 2014-04-08 DIAGNOSIS — E782 Mixed hyperlipidemia: Secondary | ICD-10-CM | POA: Insufficient documentation

## 2014-04-08 DIAGNOSIS — S79929A Unspecified injury of unspecified thigh, initial encounter: Secondary | ICD-10-CM | POA: Diagnosis not present

## 2014-04-08 DIAGNOSIS — S8012XA Contusion of left lower leg, initial encounter: Secondary | ICD-10-CM

## 2014-04-08 DIAGNOSIS — M25569 Pain in unspecified knee: Secondary | ICD-10-CM | POA: Diagnosis not present

## 2014-04-08 DIAGNOSIS — S79919A Unspecified injury of unspecified hip, initial encounter: Secondary | ICD-10-CM | POA: Diagnosis not present

## 2014-04-08 DIAGNOSIS — R6889 Other general symptoms and signs: Secondary | ICD-10-CM | POA: Diagnosis not present

## 2014-04-08 MED ORDER — TRAMADOL HCL 50 MG PO TABS
50.0000 mg | ORAL_TABLET | Freq: Once | ORAL | Status: AC
  Administered 2014-04-08: 50 mg via ORAL
  Filled 2014-04-08: qty 1

## 2014-04-08 NOTE — ED Provider Notes (Signed)
CSN: [id number]     Arrival date & time 04/08/14  1943 History   First MD Initiated Contact with Patient 04/08/14 2004     Chief Complaint  Patient presents with  . Fall    fall from wheelchair ,  pt is from home     (Consider location/radiation/quality/duration/timing/severity/associated sxs/prior Treatment) Patient is a 73 y.o. female presenting with fall. The history is provided by the patient.  Fall Pertinent negatives include no chest pain, no abdominal pain, no headaches and no shortness of breath.  pt s/p fall today, out of wheelchair. States leaned to far forward and fell onto left leg. C/o pain from left thigh to knee to lower leg. Pain constant, dull, moderate. Worse w movement. Denies hip pain. No foot pain. No leg numbness/focal weakness. Pt states at baseline is non ambulatory, in wheelchair, and cannot event use leg to bear weight/transfer. No head injury or headache. No loc. No neck or back pain. States recent health otherwise at baseline.    Past Medical History  Diagnosis Date  . GERD (gastroesophageal reflux disease)   . Hypertension   . Hyperlipidemia   . Cerebellar degeneration   . Chronic knee pain    Past Surgical History  Procedure Laterality Date  . Cholecystectomy    . Abdominal hysterectomy    . Right elbow     No family history on file. History  Substance Use Topics  . Smoking status: Never Smoker   . Smokeless tobacco: Not on file  . Alcohol Use: Not on file   OB History   Grav Para Term Preterm Abortions TAB SAB Ect Mult Living                 Review of Systems  Constitutional: Negative for fever and chills.  HENT: Negative for sore throat.   Eyes: Negative for visual disturbance.  Respiratory: Negative for shortness of breath.   Cardiovascular: Negative for chest pain and leg swelling.  Gastrointestinal: Negative for vomiting, abdominal pain and diarrhea.  Genitourinary: Negative for flank pain.  Musculoskeletal: Negative for back pain  and neck pain.  Skin: Negative for wound.  Neurological: Negative for weakness, numbness and headaches.  Hematological: Does not bruise/bleed easily.  Psychiatric/Behavioral: Negative for confusion.      Allergies  Codeine; Penicillins; and Sulfa antibiotics  Home Medications   Prior to Admission medications   Medication Sig Start Date End Date Taking? Authorizing Provider  acetaminophen (TYLENOL) 500 MG tablet Take 2,000 mg by mouth once as needed for moderate pain.   Yes Historical Provider, MD  aspirin EC 81 MG tablet Take 81 mg by mouth daily.   Yes Historical Provider, MD  atorvastatin (LIPITOR) 40 MG tablet Take 40 mg by mouth at bedtime.   Yes Historical Provider, MD  Calcium Carbonate-Vitamin D (CALTRATE 600+D PO) Take 1 tablet by mouth daily.   Yes Historical Provider, MD  diazepam (VALIUM) 2 MG tablet Take 2 mg by mouth every 4 (four) hours as needed for anxiety.    Yes Historical Provider, MD  diclofenac (VOLTAREN) 75 MG EC tablet Take 75 mg by mouth 2 (two) times daily.   Yes Historical Provider, MD  ferrous sulfate 325 (65 FE) MG tablet Take 325 mg by mouth daily with breakfast.   Yes Historical Provider, MD  levothyroxine (SYNTHROID, LEVOTHROID) 25 MCG tablet Take 25 mcg by mouth daily before breakfast.   Yes Historical Provider, MD  lisinopril-hydrochlorothiazide (PRINZIDE,ZESTORETIC) 10-12.5 MG per tablet Take 1 tablet by mouth  daily.   Yes Historical Provider, MD  meclizine (ANTIVERT) 25 MG tablet Take 25 mg by mouth 3 (three) times daily as needed for dizziness.   Yes Historical Provider, MD  Multiple Vitamin (MULTIVITAMIN WITH MINERALS) TABS tablet Take 1 tablet by mouth daily.   Yes Historical Provider, MD  omeprazole (PRILOSEC) 40 MG capsule Take 40 mg by mouth daily.   Yes Historical Provider, MD  sertraline (ZOLOFT) 100 MG tablet Take 100 mg by mouth daily.   Yes Historical Provider, MD   BP 168/99  Pulse 77  Temp(Src) 97.4 F (36.3 C) (Oral)  Resp 20  SpO2  97% Physical Exam  Nursing note and vitals reviewed. Constitutional: She is oriented to person, place, and time. She appears well-developed and well-nourished. No distress.  HENT:  Head: Atraumatic.  Eyes: Conjunctivae are normal. Pupils are equal, round, and reactive to light. No scleral icterus.  Neck: Normal range of motion. Neck supple. No tracheal deviation present.  Cardiovascular: Normal rate, regular rhythm, normal heart sounds and intact distal pulses.   Pulmonary/Chest: Effort normal and breath sounds normal. No respiratory distress. She exhibits no tenderness.  Abdominal: Soft. Normal appearance. She exhibits no distension. There is no tenderness.  Musculoskeletal: She exhibits no edema.  Tenderness left distal thigh, knee and lower leg. No focal sts or bruising. Distal pulses palp. Left knee stable, no effusion. No pain w rom at hip. Pelvis stable. Good rom bil ext, no other pain or focal bony tenderness.   Neurological: She is alert and oriented to person, place, and time.  Motor intact bil, stre 5/5, sens intact.   Skin: Skin is warm and dry. No rash noted.  Psychiatric: She has a normal mood and affect.    ED Course  Procedures (including critical care time) Labs Review    Dg Femur Left  04/08/2014   CLINICAL DATA:  Fall.  Left knee pain.  EXAM: LEFT FEMUR - 2 VIEW  COMPARISON:  Left knee series performed today.  FINDINGS: Advanced degenerative changes within the left knee involving all 3 compartments. No joint effusion. Mild degenerative changes within the left hip. Diffuse osteopenia. No acute bony abnormality. No fracture, subluxation or dislocation.  IMPRESSION: No acute bony abnormality.   Electronically Signed   By: Rolm Baptise M.D.   On: 04/08/2014 21:15   Dg Tibia/fibula Left  04/08/2014   CLINICAL DATA:  Fall.  Knee pain.  EXAM: LEFT TIBIA AND FIBULA - 2 VIEW  COMPARISON:  Left knee series performed today.  FINDINGS: No acute bony abnormality. Specifically, no  fracture, subluxation, or dislocation. Soft tissues are intact. Advanced degenerative changes in the left knee.  IMPRESSION: No acute bony abnormality.   Electronically Signed   By: Rolm Baptise M.D.   On: 04/08/2014 21:14    Dg Knee Complete 4 Views Left  04/08/2014   CLINICAL DATA:  73 year old female with left knee pain following fall  EXAM: LEFT KNEE - COMPLETE 4+ VIEW  COMPARISON:  None.  FINDINGS: There is no evidence of acute fracture, subluxation or dislocation.  Tricompartmental degenerative changes are noted, moderate in the medial and patellofemoral compartments and severe in the lateral compartment.  There is no evidence of joint effusion.  No focal bony lesions are identified.  IMPRESSION: No evidence of acute abnormality.  Moderate -severe tricompartmental degenerative changes.   Electronically Signed   By: Hassan Rowan M.D.   On: 04/08/2014 21:14     MDM  Xray.  Ultram po.  Reviewed  nursing notes and prior charts for additional history.   Recheck pt comfortable. Appears stable for d/c.      Mirna Mires, MD 04/08/14 2229

## 2014-04-08 NOTE — ED Notes (Signed)
Pt fell from wheelchair to floor,  Complaints of left knee pain

## 2014-04-08 NOTE — Discharge Instructions (Signed)
Take your voltaren as need for pain. Follow up with primary care doctor in coming week if symptoms fail to improve/resolve. Return to ER if worse, new symptoms, worsening or severe pain, swelling, fevers, other concern.     Fall Prevention and Home Safety Falls cause injuries and can affect all age groups. It is possible to use preventive measures to significantly decrease the likelihood of falls. There are many simple measures which can make your home safer and prevent falls. OUTDOORS  Repair cracks and edges of walkways and driveways.  Remove high doorway thresholds.  Trim shrubbery on the main path into your home.  Have good outside lighting.  Clear walkways of tools, rocks, debris, and clutter.  Check that handrails are not broken and are securely fastened. Both sides of steps should have handrails.  Have leaves, snow, and ice cleared regularly.  Use sand or salt on walkways during winter months.  In the garage, clean up grease or oil spills. BATHROOM  Install night lights.  Install grab bars by the toilet and in the tub and shower.  Use non-skid mats or decals in the tub or shower.  Place a plastic non-slip stool in the shower to sit on, if needed.  Keep floors dry and clean up all water on the floor immediately.  Remove soap buildup in the tub or shower on a regular basis.  Secure bath mats with non-slip, double-sided rug tape.  Remove throw rugs and tripping hazards from the floors. BEDROOMS  Install night lights.  Make sure a bedside light is easy to reach.  Do not use oversized bedding.  Keep a telephone by your bedside.  Have a firm chair with side arms to use for getting dressed.  Remove throw rugs and tripping hazards from the floor. KITCHEN  Keep handles on pots and pans turned toward the center of the stove. Use back burners when possible.  Clean up spills quickly and allow time for drying.  Avoid walking on wet floors.  Avoid hot  utensils and knives.  Position shelves so they are not too high or low.  Place commonly used objects within easy reach.  If necessary, use a sturdy step stool with a grab bar when reaching.  Keep electrical cables out of the way.  Do not use floor polish or wax that makes floors slippery. If you must use wax, use non-skid floor wax.  Remove throw rugs and tripping hazards from the floor. STAIRWAYS  Never leave objects on stairs.  Place handrails on both sides of stairways and use them. Fix any loose handrails. Make sure handrails on both sides of the stairways are as long as the stairs.  Check carpeting to make sure it is firmly attached along stairs. Make repairs to worn or loose carpet promptly.  Avoid placing throw rugs at the top or bottom of stairways, or properly secure the rug with carpet tape to prevent slippage. Get rid of throw rugs, if possible.  Have an electrician put in a light switch at the top and bottom of the stairs. OTHER FALL PREVENTION TIPS  Wear low-heel or rubber-soled shoes that are supportive and fit well. Wear closed toe shoes.  When using a stepladder, make sure it is fully opened and both spreaders are firmly locked. Do not climb a closed stepladder.  Add color or contrast paint or tape to grab bars and handrails in your home. Place contrasting color strips on first and last steps.  Learn and use mobility aids as  needed. Install an electrical emergency response system.  Turn on lights to avoid dark areas. Replace light bulbs that burn out immediately. Get light switches that glow.  Arrange furniture to create clear pathways. Keep furniture in the same place.  Firmly attach carpet with non-skid or double-sided tape.  Eliminate uneven floor surfaces.  Select a carpet pattern that does not visually hide the edge of steps.  Be aware of all pets. OTHER HOME SAFETY TIPS  Set the water temperature for 120 F (48.8 C).  Keep emergency numbers on  or near the telephone.  Keep smoke detectors on every level of the home and near sleeping areas. Document Released: 07/18/2002 Document Revised: 01/27/2012 Document Reviewed: 10/17/2011 [facility] Patient Information 2015 [place], [place]. This information is not intended to replace advice given to you by your health care provider. Make sure you discuss any questions you have with your health care provider.

## 2014-04-08 NOTE — ED Notes (Signed)
Bed: LO75 Expected date:  Expected time:  Means of arrival:  Comments: EMS 50F fall from wheelchair

## 2014-04-10 DIAGNOSIS — R55 Syncope and collapse: Secondary | ICD-10-CM | POA: Diagnosis not present

## 2014-04-10 DIAGNOSIS — R471 Dysarthria and anarthria: Secondary | ICD-10-CM | POA: Diagnosis not present

## 2014-04-10 DIAGNOSIS — I10 Essential (primary) hypertension: Secondary | ICD-10-CM | POA: Diagnosis not present

## 2014-04-10 DIAGNOSIS — G319 Degenerative disease of nervous system, unspecified: Secondary | ICD-10-CM | POA: Diagnosis not present

## 2014-04-11 DIAGNOSIS — R55 Syncope and collapse: Secondary | ICD-10-CM | POA: Diagnosis not present

## 2014-04-11 DIAGNOSIS — I10 Essential (primary) hypertension: Secondary | ICD-10-CM | POA: Diagnosis not present

## 2014-04-11 DIAGNOSIS — R471 Dysarthria and anarthria: Secondary | ICD-10-CM | POA: Diagnosis not present

## 2014-04-11 DIAGNOSIS — G319 Degenerative disease of nervous system, unspecified: Secondary | ICD-10-CM | POA: Diagnosis not present

## 2014-04-12 DIAGNOSIS — G319 Degenerative disease of nervous system, unspecified: Secondary | ICD-10-CM | POA: Diagnosis not present

## 2014-04-12 DIAGNOSIS — R471 Dysarthria and anarthria: Secondary | ICD-10-CM | POA: Diagnosis not present

## 2014-04-12 DIAGNOSIS — R55 Syncope and collapse: Secondary | ICD-10-CM | POA: Diagnosis not present

## 2014-04-12 DIAGNOSIS — I10 Essential (primary) hypertension: Secondary | ICD-10-CM | POA: Diagnosis not present

## 2014-04-13 DIAGNOSIS — I10 Essential (primary) hypertension: Secondary | ICD-10-CM | POA: Diagnosis not present

## 2014-04-13 DIAGNOSIS — G319 Degenerative disease of nervous system, unspecified: Secondary | ICD-10-CM | POA: Diagnosis not present

## 2014-04-13 DIAGNOSIS — R55 Syncope and collapse: Secondary | ICD-10-CM | POA: Diagnosis not present

## 2014-04-13 DIAGNOSIS — R471 Dysarthria and anarthria: Secondary | ICD-10-CM | POA: Diagnosis not present

## 2014-04-18 DIAGNOSIS — R55 Syncope and collapse: Secondary | ICD-10-CM | POA: Diagnosis not present

## 2014-04-18 DIAGNOSIS — G319 Degenerative disease of nervous system, unspecified: Secondary | ICD-10-CM | POA: Diagnosis not present

## 2014-04-18 DIAGNOSIS — I10 Essential (primary) hypertension: Secondary | ICD-10-CM | POA: Diagnosis not present

## 2014-04-18 DIAGNOSIS — R471 Dysarthria and anarthria: Secondary | ICD-10-CM | POA: Diagnosis not present

## 2014-04-19 DIAGNOSIS — I10 Essential (primary) hypertension: Secondary | ICD-10-CM | POA: Diagnosis not present

## 2014-04-19 DIAGNOSIS — G319 Degenerative disease of nervous system, unspecified: Secondary | ICD-10-CM | POA: Diagnosis not present

## 2014-04-19 DIAGNOSIS — R471 Dysarthria and anarthria: Secondary | ICD-10-CM | POA: Diagnosis not present

## 2014-04-19 DIAGNOSIS — R55 Syncope and collapse: Secondary | ICD-10-CM | POA: Diagnosis not present

## 2014-04-20 DIAGNOSIS — I10 Essential (primary) hypertension: Secondary | ICD-10-CM | POA: Diagnosis not present

## 2014-04-20 DIAGNOSIS — R471 Dysarthria and anarthria: Secondary | ICD-10-CM | POA: Diagnosis not present

## 2014-04-20 DIAGNOSIS — R55 Syncope and collapse: Secondary | ICD-10-CM | POA: Diagnosis not present

## 2014-04-20 DIAGNOSIS — G319 Degenerative disease of nervous system, unspecified: Secondary | ICD-10-CM | POA: Diagnosis not present

## 2014-04-25 DIAGNOSIS — H5030 Unspecified intermittent heterotropia: Secondary | ICD-10-CM | POA: Diagnosis not present

## 2014-04-26 DIAGNOSIS — I10 Essential (primary) hypertension: Secondary | ICD-10-CM | POA: Diagnosis not present

## 2014-04-26 DIAGNOSIS — G319 Degenerative disease of nervous system, unspecified: Secondary | ICD-10-CM | POA: Diagnosis not present

## 2014-04-26 DIAGNOSIS — R55 Syncope and collapse: Secondary | ICD-10-CM | POA: Diagnosis not present

## 2014-04-26 DIAGNOSIS — R471 Dysarthria and anarthria: Secondary | ICD-10-CM | POA: Diagnosis not present

## 2014-04-27 DIAGNOSIS — G319 Degenerative disease of nervous system, unspecified: Secondary | ICD-10-CM | POA: Diagnosis not present

## 2014-04-27 DIAGNOSIS — R471 Dysarthria and anarthria: Secondary | ICD-10-CM | POA: Diagnosis not present

## 2014-04-27 DIAGNOSIS — I10 Essential (primary) hypertension: Secondary | ICD-10-CM | POA: Diagnosis not present

## 2014-04-27 DIAGNOSIS — R55 Syncope and collapse: Secondary | ICD-10-CM | POA: Diagnosis not present

## 2014-04-28 DIAGNOSIS — R300 Dysuria: Secondary | ICD-10-CM | POA: Diagnosis not present

## 2014-04-28 DIAGNOSIS — R471 Dysarthria and anarthria: Secondary | ICD-10-CM | POA: Diagnosis not present

## 2014-04-28 DIAGNOSIS — R55 Syncope and collapse: Secondary | ICD-10-CM | POA: Diagnosis not present

## 2014-04-28 DIAGNOSIS — G319 Degenerative disease of nervous system, unspecified: Secondary | ICD-10-CM | POA: Diagnosis not present

## 2014-04-28 DIAGNOSIS — I10 Essential (primary) hypertension: Secondary | ICD-10-CM | POA: Diagnosis not present

## 2014-05-02 DIAGNOSIS — R55 Syncope and collapse: Secondary | ICD-10-CM | POA: Diagnosis not present

## 2014-05-02 DIAGNOSIS — R471 Dysarthria and anarthria: Secondary | ICD-10-CM | POA: Diagnosis not present

## 2014-05-02 DIAGNOSIS — I10 Essential (primary) hypertension: Secondary | ICD-10-CM | POA: Diagnosis not present

## 2014-05-02 DIAGNOSIS — G319 Degenerative disease of nervous system, unspecified: Secondary | ICD-10-CM | POA: Diagnosis not present

## 2014-05-09 DIAGNOSIS — G319 Degenerative disease of nervous system, unspecified: Secondary | ICD-10-CM | POA: Diagnosis not present

## 2014-05-09 DIAGNOSIS — R55 Syncope and collapse: Secondary | ICD-10-CM | POA: Diagnosis not present

## 2014-05-09 DIAGNOSIS — R471 Dysarthria and anarthria: Secondary | ICD-10-CM | POA: Diagnosis not present

## 2014-05-09 DIAGNOSIS — I10 Essential (primary) hypertension: Secondary | ICD-10-CM | POA: Diagnosis not present

## 2014-05-10 DIAGNOSIS — R471 Dysarthria and anarthria: Secondary | ICD-10-CM | POA: Diagnosis not present

## 2014-05-10 DIAGNOSIS — G319 Degenerative disease of nervous system, unspecified: Secondary | ICD-10-CM | POA: Diagnosis not present

## 2014-05-10 DIAGNOSIS — I10 Essential (primary) hypertension: Secondary | ICD-10-CM | POA: Diagnosis not present

## 2014-05-10 DIAGNOSIS — R55 Syncope and collapse: Secondary | ICD-10-CM | POA: Diagnosis not present

## 2014-05-22 DIAGNOSIS — R471 Dysarthria and anarthria: Secondary | ICD-10-CM | POA: Diagnosis not present

## 2014-05-22 DIAGNOSIS — G319 Degenerative disease of nervous system, unspecified: Secondary | ICD-10-CM | POA: Diagnosis not present

## 2014-05-22 DIAGNOSIS — I10 Essential (primary) hypertension: Secondary | ICD-10-CM | POA: Diagnosis not present

## 2014-05-22 DIAGNOSIS — R55 Syncope and collapse: Secondary | ICD-10-CM | POA: Diagnosis not present

## 2014-06-01 DIAGNOSIS — Z23 Encounter for immunization: Secondary | ICD-10-CM | POA: Diagnosis not present

## 2014-06-01 DIAGNOSIS — E038 Other specified hypothyroidism: Secondary | ICD-10-CM | POA: Diagnosis not present

## 2014-06-01 DIAGNOSIS — D508 Other iron deficiency anemias: Secondary | ICD-10-CM | POA: Diagnosis not present

## 2014-06-01 DIAGNOSIS — I10 Essential (primary) hypertension: Secondary | ICD-10-CM | POA: Diagnosis not present

## 2014-06-01 DIAGNOSIS — E785 Hyperlipidemia, unspecified: Secondary | ICD-10-CM | POA: Diagnosis not present

## 2014-06-01 DIAGNOSIS — K219 Gastro-esophageal reflux disease without esophagitis: Secondary | ICD-10-CM | POA: Diagnosis not present

## 2014-06-01 DIAGNOSIS — E039 Hypothyroidism, unspecified: Secondary | ICD-10-CM | POA: Diagnosis not present

## 2014-06-13 DIAGNOSIS — S62647A Nondisplaced fracture of proximal phalanx of left little finger, initial encounter for closed fracture: Secondary | ICD-10-CM | POA: Diagnosis not present

## 2014-08-01 DIAGNOSIS — R8299 Other abnormal findings in urine: Secondary | ICD-10-CM | POA: Diagnosis not present

## 2014-08-01 DIAGNOSIS — N390 Urinary tract infection, site not specified: Secondary | ICD-10-CM | POA: Diagnosis not present

## 2014-08-01 DIAGNOSIS — M549 Dorsalgia, unspecified: Secondary | ICD-10-CM | POA: Diagnosis not present

## 2014-08-09 DIAGNOSIS — N390 Urinary tract infection, site not specified: Secondary | ICD-10-CM | POA: Diagnosis not present

## 2014-08-09 DIAGNOSIS — E039 Hypothyroidism, unspecified: Secondary | ICD-10-CM | POA: Diagnosis not present

## 2014-08-09 DIAGNOSIS — I10 Essential (primary) hypertension: Secondary | ICD-10-CM | POA: Diagnosis not present

## 2014-08-09 DIAGNOSIS — R5383 Other fatigue: Secondary | ICD-10-CM | POA: Diagnosis not present

## 2014-08-29 DIAGNOSIS — H52223 Regular astigmatism, bilateral: Secondary | ICD-10-CM | POA: Diagnosis not present

## 2014-08-29 DIAGNOSIS — H4720 Unspecified optic atrophy: Secondary | ICD-10-CM | POA: Diagnosis not present

## 2014-08-29 DIAGNOSIS — H524 Presbyopia: Secondary | ICD-10-CM | POA: Diagnosis not present

## 2014-08-29 DIAGNOSIS — H5212 Myopia, left eye: Secondary | ICD-10-CM | POA: Diagnosis not present

## 2014-09-11 DIAGNOSIS — I10 Essential (primary) hypertension: Secondary | ICD-10-CM | POA: Diagnosis not present

## 2014-09-11 DIAGNOSIS — Z1389 Encounter for screening for other disorder: Secondary | ICD-10-CM | POA: Diagnosis not present

## 2014-10-04 DIAGNOSIS — R100 Acute abdomen: Secondary | ICD-10-CM | POA: Diagnosis not present

## 2014-10-04 DIAGNOSIS — J019 Acute sinusitis, unspecified: Secondary | ICD-10-CM | POA: Diagnosis not present

## 2014-10-06 DIAGNOSIS — N838 Other noninflammatory disorders of ovary, fallopian tube and broad ligament: Secondary | ICD-10-CM | POA: Diagnosis not present

## 2014-10-06 DIAGNOSIS — K5730 Diverticulosis of large intestine without perforation or abscess without bleeding: Secondary | ICD-10-CM | POA: Diagnosis not present

## 2014-10-06 DIAGNOSIS — K449 Diaphragmatic hernia without obstruction or gangrene: Secondary | ICD-10-CM | POA: Diagnosis not present

## 2014-10-23 DIAGNOSIS — R748 Abnormal levels of other serum enzymes: Secondary | ICD-10-CM | POA: Diagnosis not present

## 2014-11-24 DIAGNOSIS — H6123 Impacted cerumen, bilateral: Secondary | ICD-10-CM | POA: Diagnosis not present

## 2014-11-24 DIAGNOSIS — H7292 Unspecified perforation of tympanic membrane, left ear: Secondary | ICD-10-CM | POA: Diagnosis not present

## 2014-12-07 DIAGNOSIS — G119 Hereditary ataxia, unspecified: Secondary | ICD-10-CM | POA: Insufficient documentation

## 2014-12-08 DIAGNOSIS — E785 Hyperlipidemia, unspecified: Secondary | ICD-10-CM | POA: Diagnosis not present

## 2014-12-08 DIAGNOSIS — E039 Hypothyroidism, unspecified: Secondary | ICD-10-CM | POA: Diagnosis not present

## 2014-12-08 DIAGNOSIS — M25562 Pain in left knee: Secondary | ICD-10-CM | POA: Diagnosis not present

## 2014-12-08 DIAGNOSIS — M25561 Pain in right knee: Secondary | ICD-10-CM | POA: Diagnosis not present

## 2014-12-08 DIAGNOSIS — I10 Essential (primary) hypertension: Secondary | ICD-10-CM | POA: Diagnosis not present

## 2014-12-08 DIAGNOSIS — G8929 Other chronic pain: Secondary | ICD-10-CM | POA: Diagnosis not present

## 2015-01-09 DIAGNOSIS — M549 Dorsalgia, unspecified: Secondary | ICD-10-CM | POA: Diagnosis not present

## 2015-01-21 ENCOUNTER — Emergency Department (HOSPITAL_COMMUNITY): Payer: Medicare Other

## 2015-01-21 ENCOUNTER — Encounter (HOSPITAL_COMMUNITY): Payer: Self-pay | Admitting: Emergency Medicine

## 2015-01-21 ENCOUNTER — Emergency Department (HOSPITAL_COMMUNITY)
Admission: EM | Admit: 2015-01-21 | Discharge: 2015-01-21 | Disposition: A | Discharge status: Home / Self Care | Payer: Medicare Other | Attending: Emergency Medicine | Admitting: Emergency Medicine

## 2015-01-21 DIAGNOSIS — Z7982 Long term (current) use of aspirin: Secondary | ICD-10-CM | POA: Diagnosis not present

## 2015-01-21 DIAGNOSIS — I10 Essential (primary) hypertension: Secondary | ICD-10-CM | POA: Diagnosis not present

## 2015-01-21 DIAGNOSIS — R0602 Shortness of breath: Secondary | ICD-10-CM | POA: Diagnosis present

## 2015-01-21 DIAGNOSIS — G8929 Other chronic pain: Secondary | ICD-10-CM | POA: Diagnosis not present

## 2015-01-21 DIAGNOSIS — K219 Gastro-esophageal reflux disease without esophagitis: Secondary | ICD-10-CM | POA: Insufficient documentation

## 2015-01-21 DIAGNOSIS — J189 Pneumonia, unspecified organism: Secondary | ICD-10-CM | POA: Diagnosis not present

## 2015-01-21 DIAGNOSIS — Z880 Allergy status to penicillin: Secondary | ICD-10-CM | POA: Diagnosis not present

## 2015-01-21 DIAGNOSIS — Z79899 Other long term (current) drug therapy: Secondary | ICD-10-CM | POA: Insufficient documentation

## 2015-01-21 DIAGNOSIS — R0682 Tachypnea, not elsewhere classified: Secondary | ICD-10-CM | POA: Diagnosis not present

## 2015-01-21 DIAGNOSIS — Z8669 Personal history of other diseases of the nervous system and sense organs: Secondary | ICD-10-CM | POA: Diagnosis not present

## 2015-01-21 DIAGNOSIS — E785 Hyperlipidemia, unspecified: Secondary | ICD-10-CM | POA: Insufficient documentation

## 2015-01-21 DIAGNOSIS — J209 Acute bronchitis, unspecified: Secondary | ICD-10-CM | POA: Diagnosis not present

## 2015-01-21 DIAGNOSIS — Z791 Long term (current) use of non-steroidal anti-inflammatories (NSAID): Secondary | ICD-10-CM | POA: Diagnosis not present

## 2015-01-21 DIAGNOSIS — R05 Cough: Secondary | ICD-10-CM | POA: Diagnosis not present

## 2015-01-21 DIAGNOSIS — D649 Anemia, unspecified: Secondary | ICD-10-CM | POA: Diagnosis not present

## 2015-01-21 LAB — CBC
HCT: 34 % — ABNORMAL LOW (ref 36.0–46.0)
Hemoglobin: 10.9 g/dL — ABNORMAL LOW (ref 12.0–15.0)
MCH: 30.5 pg (ref 26.0–34.0)
MCHC: 32.1 g/dL (ref 30.0–36.0)
MCV: 95.2 fL (ref 78.0–100.0)
Platelets: 154 10*3/uL (ref 150–400)
RBC: 3.57 MIL/uL — AB (ref 3.87–5.11)
RDW: 14.2 % (ref 11.5–15.5)
WBC: 4.9 10*3/uL (ref 4.0–10.5)

## 2015-01-21 LAB — COMPREHENSIVE METABOLIC PANEL
ALBUMIN: 3.6 g/dL (ref 3.5–5.0)
ALT: 24 U/L (ref 14–54)
ANION GAP: 6 (ref 5–15)
AST: 26 U/L (ref 15–41)
Alkaline Phosphatase: 61 U/L (ref 38–126)
BUN: 29 mg/dL — ABNORMAL HIGH (ref 6–20)
CALCIUM: 8.6 mg/dL — AB (ref 8.9–10.3)
CHLORIDE: 110 mmol/L (ref 101–111)
CO2: 23 mmol/L (ref 22–32)
CREATININE: 1.13 mg/dL — AB (ref 0.44–1.00)
GFR calc non Af Amer: 47 mL/min — ABNORMAL LOW (ref >60)
GFR, EST AFRICAN AMERICAN: 54 mL/min — AB (ref >60)
Glucose, Bld: 100 mg/dL — ABNORMAL HIGH (ref 65–99)
Potassium: 4 mmol/L (ref 3.5–5.1)
Sodium: 139 mmol/L (ref 135–145)
Total Bilirubin: 0.4 mg/dL (ref 0.3–1.2)
Total Protein: 6.7 g/dL (ref 6.5–8.1)

## 2015-01-21 LAB — BRAIN NATRIURETIC PEPTIDE: B Natriuretic Peptide: 15.9 pg/mL (ref 0.0–100.0)

## 2015-01-21 MED ORDER — ALBUTEROL SULFATE (2.5 MG/3ML) 0.083% IN NEBU
5.0000 mg | INHALATION_SOLUTION | Freq: Once | RESPIRATORY_TRACT | Status: AC
  Administered 2015-01-21: 5 mg via RESPIRATORY_TRACT
  Filled 2015-01-21: qty 6

## 2015-01-21 MED ORDER — AEROCHAMBER PLUS W/MASK MISC
1.0000 | Freq: Once | Status: AC
  Administered 2015-01-21: 1
  Filled 2015-01-21: qty 1

## 2015-01-21 MED ORDER — ALBUTEROL SULFATE HFA 108 (90 BASE) MCG/ACT IN AERS
2.0000 | INHALATION_SPRAY | RESPIRATORY_TRACT | Status: DC | PRN
  Filled 2015-01-21: qty 6.7

## 2015-01-21 NOTE — ED Notes (Signed)
Bed: VX42 Expected date:  Expected time:  Means of arrival:  Comments: EMS-cough, congestion

## 2015-01-21 NOTE — Discharge Instructions (Signed)
Acute Bronchitis Use your inhaler 2 puffs every 4 hours as needed for cough or shortness of breath. See your primary care physician if not improving in 7-10 days. Return if your condition worsens for any reason. Bronchitis is inflammation of the airways that extend from the windpipe into the lungs (bronchi). The inflammation often causes mucus to develop. This leads to a cough, which is the most common symptom of bronchitis.  In acute bronchitis, the condition usually develops suddenly and goes away over time, usually in a couple weeks. Smoking, allergies, and asthma can make bronchitis worse. Repeated episodes of bronchitis may cause further lung problems.  CAUSES Acute bronchitis is most often caused by the same virus that causes a cold. The virus can spread from person to person (contagious) through coughing, sneezing, and touching contaminated objects. SIGNS AND SYMPTOMS   Cough.   Fever.   Coughing up mucus.   Body aches.   Chest congestion.   Chills.   Shortness of breath.   Sore throat.  DIAGNOSIS  Acute bronchitis is usually diagnosed through a physical exam. Your health care provider will also ask you questions about your medical history. Tests, such as chest X-rays, are sometimes done to rule out other conditions.  TREATMENT  Acute bronchitis usually goes away in a couple weeks. Oftentimes, no medical treatment is necessary. Medicines are sometimes given for relief of fever or cough. Antibiotic medicines are usually not needed but may be prescribed in certain situations. In some cases, an inhaler may be recommended to help reduce shortness of breath and control the cough. A cool mist vaporizer may also be used to help thin bronchial secretions and make it easier to clear the chest.  HOME CARE INSTRUCTIONS  Get plenty of rest.   Drink enough fluids to keep your urine clear or pale yellow (unless you have a medical condition that requires fluid restriction). Increasing  fluids may help thin your respiratory secretions (sputum) and reduce chest congestion, and it will prevent dehydration.   Take medicines only as directed by your health care provider.  If you were prescribed an antibiotic medicine, finish it all even if you start to feel better.  Avoid smoking and secondhand smoke. Exposure to cigarette smoke or irritating chemicals will make bronchitis worse. If you are a smoker, consider using nicotine gum or skin patches to help control withdrawal symptoms. Quitting smoking will help your lungs heal faster.   Reduce the chances of another bout of acute bronchitis by washing your hands frequently, avoiding people with cold symptoms, and trying not to touch your hands to your mouth, nose, or eyes.   Keep all follow-up visits as directed by your health care provider.  SEEK MEDICAL CARE IF: Your symptoms do not improve after 1 week of treatment.  SEEK IMMEDIATE MEDICAL CARE IF:  You develop an increased fever or chills.   You have chest pain.   You have severe shortness of breath.  You have bloody sputum.   You develop dehydration.  You faint or repeatedly feel like you are going to pass out.  You develop repeated vomiting.  You develop a severe headache. MAKE SURE YOU:   Understand these instructions.  Will watch your condition.  Will get help right away if you are not doing well or get worse. Document Released: 09/04/2004 Document Revised: 12/12/2013 Document Reviewed: 01/18/2013 [facility] Patient Information 2015 [place], [place]. This information is not intended to replace advice given to you by your health care provider. Make sure  you discuss any questions you have with your health care provider.

## 2015-01-21 NOTE — ED Notes (Signed)
Sob, cough yellow phlem since Friday. Take robitussin and another pill with minimal relief. Has felt warm and running a fever at home. Alert x4, Non mobile at home,

## 2015-01-21 NOTE — ED Provider Notes (Signed)
CSN: [id number]     Arrival date & time 01/21/15  7619 History   First MD Initiated Contact with Patient 01/21/15 1003     Chief Complaint  Patient presents with  . Shortness of Breath  . Cough     (Consider location/radiation/quality/duration/timing/severity/associated sxs/prior Treatment) HPI Complains of nonproductive cough, shortness of breath and nasal congestion onset 4 days ago. Denies yellow phlegm. Denies fever no nausea or vomiting. No other associated symptoms. Treated with over-the-counter cold medicine and cough medicine without relief. Nothing makes symptoms better or worse. States "I thought I had hayfever" Past Medical History  Diagnosis Date  . GERD (gastroesophageal reflux disease)   . Hypertension   . Hyperlipidemia   . Cerebellar degeneration   . Chronic knee pain    Past Surgical History  Procedure Laterality Date  . Cholecystectomy    . Abdominal hysterectomy    . Right elbow     No family history on file. History  Substance Use Topics  . Smoking status: Never Smoker   . Smokeless tobacco: Not on file  . Alcohol Use: Not on file   OB History    No data available     Review of Systems  Constitutional: Negative.   HENT: Positive for congestion.   Respiratory: Positive for cough and shortness of breath.   Cardiovascular: Negative.   Gastrointestinal: Negative.   Musculoskeletal: Positive for gait problem.       Non-ambulatory. Uses a motorized scooter  Skin: Negative.   Neurological: Negative.   Psychiatric/Behavioral: Negative.   All other systems reviewed and are negative.     Allergies  Codeine; Penicillins; and Sulfa antibiotics  Home Medications   Prior to Admission medications   Medication Sig Start Date End Date Taking? Authorizing Provider  acetaminophen (TYLENOL) 500 MG tablet Take 2,000 mg by mouth once as needed for moderate pain.    Historical Provider, MD  aspirin EC 81 MG tablet Take 81 mg by mouth daily.    Historical  Provider, MD  atorvastatin (LIPITOR) 40 MG tablet Take 40 mg by mouth at bedtime.    Historical Provider, MD  Calcium Carbonate-Vitamin D (CALTRATE 600+D PO) Take 1 tablet by mouth daily.    Historical Provider, MD  diazepam (VALIUM) 2 MG tablet Take 2 mg by mouth every 4 (four) hours as needed for anxiety.     Historical Provider, MD  diclofenac (VOLTAREN) 75 MG EC tablet Take 75 mg by mouth 2 (two) times daily.    Historical Provider, MD  ferrous sulfate 325 (65 FE) MG tablet Take 325 mg by mouth daily with breakfast.    Historical Provider, MD  levothyroxine (SYNTHROID, LEVOTHROID) 25 MCG tablet Take 25 mcg by mouth daily before breakfast.    Historical Provider, MD  lisinopril-hydrochlorothiazide (PRINZIDE,ZESTORETIC) 10-12.5 MG per tablet Take 1 tablet by mouth daily.    Historical Provider, MD  meclizine (ANTIVERT) 25 MG tablet Take 25 mg by mouth 3 (three) times daily as needed for dizziness.    Historical Provider, MD  Multiple Vitamin (MULTIVITAMIN WITH MINERALS) TABS tablet Take 1 tablet by mouth daily.    Historical Provider, MD  omeprazole (PRILOSEC) 40 MG capsule Take 40 mg by mouth daily.    Historical Provider, MD  sertraline (ZOLOFT) 100 MG tablet Take 100 mg by mouth daily.    Historical Provider, MD   BP 153/72 mmHg  Pulse 93  Temp(Src) 99.4 F (37.4 C) (Rectal)  Resp 24  Ht 5' (1.524 m)  Wt  215 lb (97.523 kg)  BMI 41.99 kg/m2  SpO2 97% Physical Exam  Constitutional: She is oriented to person, place, and time. No distress.  Chronically ill-appearing no acute distress  HENT:  Head: Normocephalic and atraumatic.  Nasal congestion  Eyes: Conjunctivae are normal. Pupils are equal, round, and reactive to light.  Neck: Neck supple. No tracheal deviation present. No thyromegaly present.  Cardiovascular: Normal rate and regular rhythm.   No murmur heard. Pulmonary/Chest: Effort normal. No respiratory distress.  Diffuse rhonchi. No distress speaks in paragraphs  Abdominal:  Soft. Bowel sounds are normal. She exhibits no distension. There is no tenderness.  Obese  Musculoskeletal: Normal range of motion. She exhibits no edema or tenderness.  Neurological: She is alert and oriented to person, place, and time. No cranial nerve deficit.  Skin: Skin is warm and dry. No rash noted.  Psychiatric: She has a normal mood and affect.  Nursing note and vitals reviewed.   ED Course  Procedures (including critical care time) Labs Review Labs Reviewed  CBC - Abnormal; Notable for the following:    RBC 3.57 (*)    Hemoglobin 10.9 (*)    HCT 34.0 (*)    All other components within normal limits  BRAIN NATRIURETIC PEPTIDE  COMPREHENSIVE METABOLIC PANEL    Imaging Review No results found.   EKG Interpretation None     Chest x-ray viewed by me 12:45 PM Patient states her breathing is unchanged after treatment with nebulizer however she reports coughing up some "green phlegm" with nebulized treatment. She is in no respiratory distress. Pulse oximetry on room air 100% . Results for orders placed or performed during the hospital encounter of 01/21/15  Brain natriuretic peptide (only with dyspnea)  Result Value Ref Range   B Natriuretic Peptide 15.9 0.0 - 100.0 pg/mL  CBC  Result Value Ref Range   WBC 4.9 4.0 - 10.5 K/uL   RBC 3.57 (L) 3.87 - 5.11 MIL/uL   Hemoglobin 10.9 (L) 12.0 - 15.0 g/dL   HCT 34.0 (L) 36.0 - 46.0 %   MCV 95.2 78.0 - 100.0 fL   MCH 30.5 26.0 - 34.0 pg   MCHC 32.1 30.0 - 36.0 g/dL   RDW 14.2 11.5 - 15.5 %   Platelets 154 150 - 400 K/uL  Comprehensive metabolic panel  Result Value Ref Range   Sodium 139 135 - 145 mmol/L   Potassium 4.0 3.5 - 5.1 mmol/L   Chloride 110 101 - 111 mmol/L   CO2 23 22 - 32 mmol/L   Glucose, Bld 100 (H) 65 - 99 mg/dL   BUN 29 (H) 6 - 20 mg/dL   Creatinine, Ser 1.13 (H) 0.44 - 1.00 mg/dL   Calcium 8.6 (L) 8.9 - 10.3 mg/dL   Total Protein 6.7 6.5 - 8.1 g/dL   Albumin 3.6 3.5 - 5.0 g/dL   AST 26 15 - 41 U/L    ALT 24 14 - 54 U/L   Alkaline Phosphatase 61 38 - 126 U/L   Total Bilirubin 0.4 0.3 - 1.2 mg/dL   GFR calc non Af Amer 47 (L) >60 mL/min   GFR calc Af Amer 54 (L) >60 mL/min   Anion gap 6 5 - 15   Dg Chest 2 View  01/21/2015   CLINICAL DATA:  74 year old female with shortness breath and productive cough for the past 2-3 days.  EXAM: CHEST  2 VIEW  COMPARISON:  Chest x-ray a 03/25/2014.  FINDINGS: Lung volumes are normal. No consolidative  airspace disease. No pleural effusions. Mild diffuse peribronchial cuffing and interstitial prominence. Heart size is mildly enlarged. Upper mediastinal contours are within normal limits allowing for patient rotation to the left. Large hiatal hernia. Atherosclerotic calcifications in the thoracic aorta.  IMPRESSION: 1. Mild diffuse peribronchial cuffing and interstitial prominence may reflect a mild bronchitis. 2. Mild cardiomegaly. 3. Atherosclerosis. 4. Large hiatal hernia.   Electronically Signed   By: Vinnie Langton M.D.   On: 01/21/2015 10:36  chest xray viewed by me  MDM  Anemia is chronic Final diagnoses:  None   plan albuterol HFA with spacer to go to use 2 puffs every 4 hours when necessary cough or shortness of breath. F/u pmd 2 weeks Dx #1 acute bronchitis #2 anemia     Orlie Dakin, MD 01/21/15 1310

## 2015-01-21 NOTE — ED Notes (Signed)
Pt waiting on EMS transport back home

## 2015-01-21 NOTE — ED Notes (Signed)
Patient transported to X-ray 

## 2015-02-26 DIAGNOSIS — R69 Illness, unspecified: Secondary | ICD-10-CM | POA: Diagnosis not present

## 2015-03-16 DIAGNOSIS — Z1231 Encounter for screening mammogram for malignant neoplasm of breast: Secondary | ICD-10-CM | POA: Diagnosis not present

## 2015-04-11 DIAGNOSIS — M25562 Pain in left knee: Secondary | ICD-10-CM | POA: Diagnosis not present

## 2015-04-11 DIAGNOSIS — M25561 Pain in right knee: Secondary | ICD-10-CM | POA: Diagnosis not present

## 2015-04-11 DIAGNOSIS — M170 Bilateral primary osteoarthritis of knee: Secondary | ICD-10-CM | POA: Diagnosis not present

## 2015-05-07 DIAGNOSIS — R51 Headache: Secondary | ICD-10-CM | POA: Diagnosis not present

## 2015-05-07 DIAGNOSIS — H532 Diplopia: Secondary | ICD-10-CM | POA: Diagnosis not present

## 2015-05-07 DIAGNOSIS — R4781 Slurred speech: Secondary | ICD-10-CM | POA: Diagnosis not present

## 2015-05-07 DIAGNOSIS — G119 Hereditary ataxia, unspecified: Secondary | ICD-10-CM | POA: Diagnosis not present

## 2015-05-08 ENCOUNTER — Ambulatory Visit: Payer: Self-pay | Admitting: Family Medicine

## 2015-05-10 NOTE — Progress Notes (Signed)
Subjective:  Patient ID: Tamara Stein, female    DOB: 15-Jul-1941  Age: 74 y.o. MRN: [id number]  CC: Establish Care   HPI Quinteria Chisum presents for cerebellar degeneration. LIght headed all the time. 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 Bone on bone. 6-8/10 knee pain. Says she can handle it. Uses diazepam for chest tightness, anxiety.Only occasional use. Uses one tab every 4 mos. Used to use [facility]. Dr. Jordan Likes. She moved to [place] about a year and a half ago when her husband passed away. She is unable to ambulate. She is in a power wheelchair at all times.  History Marvene has a past medical history of GERD (gastroesophageal reflux disease); Hypertension; Hyperlipidemia; Cerebellar degeneration; Chronic knee pain; Allergy; Anxiety; Cataract; Depression; Thyroid disease; and Chronic kidney disease.   She has past surgical history that includes Cholecystectomy; Abdominal hysterectomy; and RIGHT ELBOW.   Her family history includes Arthritis in her sister; Cancer in her brother; Diabetes in her brother; Heart disease in her brother; Hyperlipidemia in her sister; Hypertension in her sister.She reports that she has never smoked. She does not have any smokeless tobacco history on file. She reports that she does not drink alcohol or use illicit drugs.  Outpatient Prescriptions Prior to Visit  Medication Sig Dispense Refill  . aspirin EC 81 MG tablet Take 81 mg by mouth every morning.     Marland Kitchen atorvastatin (LIPITOR) 40 MG tablet Take 40 mg by mouth at bedtime.    . Calcium Carbonate-Vitamin D (CALTRATE 600+D PO) Take 1 tablet by mouth daily.    . diclofenac (VOLTAREN) 50 MG EC tablet Take 50 mg by mouth 3 (three) times daily.    . ferrous sulfate 325 (65 FE) MG tablet Take 325 mg by mouth daily with breakfast.    . levothyroxine (SYNTHROID, LEVOTHROID) 25 MCG tablet Take 50 mcg by mouth daily before breakfast.     .  lisinopril-hydrochlorothiazide (PRINZIDE,ZESTORETIC) 20-12.5 MG per tablet Take 1 tablet by mouth daily.    . Multiple Vitamin (MULTIVITAMIN WITH MINERALS) TABS tablet Take 1 tablet by mouth daily.    Marland Kitchen omeprazole (PRILOSEC) 40 MG capsule Take 40 mg by mouth daily.    . sertraline (ZOLOFT) 100 MG tablet Take 100 mg by mouth daily.    . meclizine (ANTIVERT) 25 MG tablet Take 25 mg by mouth 3 (three) times daily as needed for dizziness.    . diazepam (VALIUM) 2 MG tablet Take 2 mg by mouth every 4 (four) hours as needed for anxiety.      No facility-administered medications prior to visit.    ROS Review of Systems  Constitutional: Negative for fever, chills, diaphoresis, appetite change, fatigue and unexpected weight change.  HENT: Negative for congestion, ear pain, hearing loss, postnasal drip, rhinorrhea, sneezing, sore throat and trouble swallowing.   Eyes: Negative for pain.  Respiratory: Negative for cough, chest tightness and shortness of breath.   Cardiovascular: Negative for chest pain and palpitations.  Gastrointestinal: Positive for constipation (has to use miralax.). Negative for nausea, vomiting, abdominal pain and diarrhea.  Genitourinary: Negative for dysuria, frequency and menstrual problem.  Musculoskeletal: Negative for joint swelling and arthralgias.  Skin: Negative for rash.  Neurological: Negative for dizziness, weakness, numbness and headaches.  Psychiatric/Behavioral: Negative for dysphoric mood and agitation.    Objective:  BP 140/75 mmHg  Pulse 74  Temp(Src) 97.3 F (36.3 C) (Oral)  Wt   BP Readings from Last 3  Encounters:  05/11/15 140/75  01/21/15 107/56  04/08/14 168/99    Wt Readings from Last 3 Encounters:  01/21/15 215 lb (97.523 kg)  03/27/14 210 lb 8.6 oz (95.5 kg)     Physical Exam  Constitutional: She is oriented to person, place, and time. She appears well-developed and well-nourished. No distress.  HENT:  Head: Normocephalic and  atraumatic.  Right Ear: External ear normal.  Left Ear: External ear normal.  Nose: Nose normal.  Mouth/Throat: Oropharynx is clear and moist.  Eyes: Conjunctivae and EOM are normal. Pupils are equal, round, and reactive to light.  Neck: Normal range of motion. Neck supple. No thyromegaly present.  Cardiovascular: Normal rate, regular rhythm and normal heart sounds.   No murmur heard. Pulmonary/Chest: Effort normal and breath sounds normal. No respiratory distress. She has no wheezes. She has no rales.  Abdominal: Soft. Bowel sounds are normal. She exhibits no distension. There is no tenderness.  Lymphadenopathy:    She has no cervical adenopathy.  Neurological: She is alert and oriented to person, place, and time. She has normal reflexes.  Skin: Skin is warm and dry.  Psychiatric: She has a normal mood and affect. Her behavior is normal. Judgment and thought content normal.    Lab Results  Component Value Date   HGBA1C 5.9* 03/25/2014    Lab Results  Component Value Date   WBC 4.9 01/21/2015   HGB 10.9* 01/21/2015   HCT 34.0* 01/21/2015   PLT 154 01/21/2015   GLUCOSE 100* 01/21/2015   CHOL 161 03/25/2014   TRIG 124 03/25/2014   HDL 48 03/25/2014   LDLCALC 88 03/25/2014   ALT 24 01/21/2015   AST 26 01/21/2015   NA 139 01/21/2015   K 4.0 01/21/2015   CL 110 01/21/2015   CREATININE 1.13* 01/21/2015   BUN 29* 01/21/2015   CO2 23 01/21/2015   HGBA1C 5.9* 03/25/2014    Dg Chest 2 View  01/21/2015   CLINICAL DATA:  74 year old female with shortness breath and productive cough for the past 2-3 days.  EXAM: CHEST  2 VIEW  COMPARISON:  Chest x-ray a 03/25/2014.  FINDINGS: Lung volumes are normal. No consolidative airspace disease. No pleural effusions. Mild diffuse peribronchial cuffing and interstitial prominence. Heart size is mildly enlarged. Upper mediastinal contours are within normal limits allowing for patient rotation to the left. Large hiatal hernia. Atherosclerotic  calcifications in the thoracic aorta.  IMPRESSION: 1. Mild diffuse peribronchial cuffing and interstitial prominence may reflect a mild bronchitis. 2. Mild cardiomegaly. 3. Atherosclerosis. 4. Large hiatal hernia.   Electronically Signed   By: Vinnie Langton M.D.   On: 01/21/2015 10:36    Assessment & Plan:   Tanisha was seen today for establish care.  Diagnoses and all orders for this visit:  Essential hypertension  Hyperlipidemia  Gastroesophageal reflux disease with esophagitis  Cerebellar degeneration  Encounter for immunization  Other orders -     Linaclotide (LINZESS) 145 MCG CAPS capsule; Take 1 capsule (145 mcg total) by mouth daily. -     DULoxetine (CYMBALTA) 30 MG capsule; Take 1 capsule (30 mg total) by mouth daily. -     Flu Vaccine QUAD 36+ mos IM   I have discontinued Ms. Yellowhair's sertraline, diazepam, and diazepam. I am also having her start on Linaclotide and DULoxetine. Additionally, I am having her maintain her atorvastatin, omeprazole, levothyroxine, meclizine, aspirin EC, multivitamin with minerals, Calcium Carbonate-Vitamin D (CALTRATE 600+D PO), ferrous sulfate, lisinopril-hydrochlorothiazide, and diclofenac.  Meds ordered this  encounter  Medications  . DISCONTD: diclofenac (CATAFLAM) 50 MG tablet    Sig: Take 1 tablet by mouth 3 (three) times daily.  Marland Kitchen DISCONTD: diazepam (VALIUM) 5 MG tablet    Sig: Take 5 mg by mouth every 6 (six) hours as needed for anxiety.  Marland Kitchen Linaclotide (LINZESS) 145 MCG CAPS capsule    Sig: Take 1 capsule (145 mcg total) by mouth daily.    Dispense:  30 capsule    Refill:  5  . DULoxetine (CYMBALTA) 30 MG capsule    Sig: Take 1 capsule (30 mg total) by mouth daily.    Dispense:  30 capsule    Refill:  5     Follow-up: No Follow-up on file.  Claretta Fraise, M.D.

## 2015-05-11 ENCOUNTER — Encounter: Payer: Self-pay | Admitting: Family Medicine

## 2015-05-11 ENCOUNTER — Ambulatory Visit (INDEPENDENT_AMBULATORY_CARE_PROVIDER_SITE_OTHER): Payer: Medicare Other | Admitting: Family Medicine

## 2015-05-11 VITALS — BP 140/75 | HR 74 | Temp 97.3°F

## 2015-05-11 DIAGNOSIS — K2100 Gastro-esophageal reflux disease with esophagitis, without bleeding: Secondary | ICD-10-CM

## 2015-05-11 DIAGNOSIS — I10 Essential (primary) hypertension: Secondary | ICD-10-CM

## 2015-05-11 DIAGNOSIS — Z23 Encounter for immunization: Secondary | ICD-10-CM

## 2015-05-11 DIAGNOSIS — G319 Degenerative disease of nervous system, unspecified: Secondary | ICD-10-CM | POA: Diagnosis not present

## 2015-05-11 DIAGNOSIS — E785 Hyperlipidemia, unspecified: Secondary | ICD-10-CM | POA: Diagnosis not present

## 2015-05-11 MED ORDER — LINACLOTIDE 145 MCG PO CAPS: 145.0000 ug | ORAL_CAPSULE | Freq: Every day | ORAL | Status: DC

## 2015-05-11 MED ORDER — DULOXETINE HCL 30 MG PO CPEP: 30.0000 mg | ORAL_CAPSULE | Freq: Every day | ORAL | Status: DC

## 2015-05-11 NOTE — Patient Instructions (Addendum)
Take the omeprazole on an empty stomach. This means that you should have nothing to eat or drink except water for two hours before taking the medication. Then wait one hour after taking the medicine before eating.   Discontinue valium and sertraline. Use cymbalta instead. It should help with anxiety such as your chest tightness, depression, and it also helps with arthritis.

## 2015-06-05 ENCOUNTER — Ambulatory Visit (INDEPENDENT_AMBULATORY_CARE_PROVIDER_SITE_OTHER): Payer: Medicare Other | Admitting: Family Medicine

## 2015-06-05 ENCOUNTER — Encounter: Payer: Self-pay | Admitting: Family Medicine

## 2015-06-05 VITALS — BP 139/80 | HR 88 | Temp 96.6°F

## 2015-06-05 DIAGNOSIS — F329 Major depressive disorder, single episode, unspecified: Secondary | ICD-10-CM

## 2015-06-05 DIAGNOSIS — E039 Hypothyroidism, unspecified: Secondary | ICD-10-CM | POA: Diagnosis not present

## 2015-06-05 DIAGNOSIS — H6505 Acute serous otitis media, recurrent, left ear: Secondary | ICD-10-CM

## 2015-06-05 DIAGNOSIS — K5901 Slow transit constipation: Secondary | ICD-10-CM

## 2015-06-05 DIAGNOSIS — E785 Hyperlipidemia, unspecified: Secondary | ICD-10-CM | POA: Diagnosis not present

## 2015-06-05 DIAGNOSIS — K219 Gastro-esophageal reflux disease without esophagitis: Secondary | ICD-10-CM

## 2015-06-05 DIAGNOSIS — G319 Degenerative disease of nervous system, unspecified: Secondary | ICD-10-CM | POA: Diagnosis not present

## 2015-06-05 DIAGNOSIS — F32A Depression, unspecified: Secondary | ICD-10-CM

## 2015-06-05 DIAGNOSIS — I10 Essential (primary) hypertension: Secondary | ICD-10-CM

## 2015-06-05 DIAGNOSIS — K5900 Constipation, unspecified: Secondary | ICD-10-CM | POA: Insufficient documentation

## 2015-06-05 MED ORDER — MECLIZINE HCL 25 MG PO TABS: 25.0000 mg | ORAL_TABLET | Freq: Three times a day (TID) | ORAL | Status: DC | PRN

## 2015-06-05 MED ORDER — LEVOFLOXACIN 500 MG PO TABS: 500.0000 mg | ORAL_TABLET | Freq: Every day | ORAL | Status: DC

## 2015-06-05 MED ORDER — LUBIPROSTONE 24 MCG PO CAPS: 24.0000 ug | ORAL_CAPSULE | Freq: Two times a day (BID) | ORAL | Status: DC

## 2015-06-05 MED ORDER — DULOXETINE HCL 60 MG PO CPEP: 60.0000 mg | ORAL_CAPSULE | Freq: Every day | ORAL | Status: DC

## 2015-06-05 NOTE — Progress Notes (Signed)
Subjective:  Patient ID: Tamara Stein, female    DOB: 08/05/1941  Age: 74 y.o. MRN: [id number]  CC: Hypertension   HPI Tamara Stein presents for  follow-up of hypertension. Patient has no history of headache chest pain or shortness of breath or recent cough. Patient also denies symptoms of TIA such as numbness weakness lateralizing. Patient checks  blood pressure at home and has not had any elevated readings recently. Patient denies side effects from his medication. States taking it regularly.  Started pt. On cymbalta 1 month ago. Doing well. Denies anxiety, depression. DCed sertraline and valium. Has chronic stomach pain would like referral. Has GERD, but also dull lower abdominal discomfort. Continued to have BM every 3-4 days only. Using miralax 3 times a week. Could not afford linzess.   Pt. Also experiencing left ear pain. 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 Onset 2-3 days ago. Some drainage. Has had a cold. Feels congested.  History Tamara Stein has a past medical history of GERD (gastroesophageal reflux disease); Hypertension; Hyperlipidemia; Cerebellar degeneration; Chronic knee pain; Allergy; Anxiety; Cataract; Depression; Thyroid disease; and Chronic kidney disease.   She has past surgical history that includes Cholecystectomy; Abdominal hysterectomy; and RIGHT ELBOW.   Her family history includes Arthritis in her sister; Cancer in her brother; Diabetes in her brother; Heart disease in her brother; Hyperlipidemia in her sister; Hypertension in her sister.She reports that she has never smoked. She does not have any smokeless tobacco history on file. She reports that she does not drink alcohol or use illicit drugs.  Current Outpatient Prescriptions on File Prior to Visit  Medication Sig Dispense Refill  . aspirin EC 81 MG tablet Take 81 mg by mouth every morning.     Marland Kitchen atorvastatin (LIPITOR) 40 MG tablet Take 40 mg by mouth at bedtime.    . Calcium Carbonate-Vitamin D  (CALTRATE 600+D PO) Take 1 tablet by mouth daily.    . diclofenac (VOLTAREN) 50 MG EC tablet Take 50 mg by mouth 3 (three) times daily.    . ferrous sulfate 325 (65 FE) MG tablet Take 325 mg by mouth daily with breakfast.    . levothyroxine (SYNTHROID, LEVOTHROID) 25 MCG tablet Take 50 mcg by mouth daily before breakfast.     . lisinopril-hydrochlorothiazide (PRINZIDE,ZESTORETIC) 20-12.5 MG per tablet Take 1 tablet by mouth daily.    . Multiple Vitamin (MULTIVITAMIN WITH MINERALS) TABS tablet Take 1 tablet by mouth daily.    Marland Kitchen omeprazole (PRILOSEC) 40 MG capsule Take 40 mg by mouth daily.    . Linaclotide (LINZESS) 145 MCG CAPS capsule Take 1 capsule (145 mcg total) by mouth daily. 30 capsule 5   No current facility-administered medications on file prior to visit.    ROS Review of Systems  Constitutional: Positive for fatigue. Negative for fever, chills, diaphoresis, appetite change and unexpected weight change.  HENT: Positive for congestion, ear pain and rhinorrhea. Negative for hearing loss, postnasal drip, sneezing, sore throat and trouble swallowing.   Eyes: Negative for pain.  Respiratory: Negative for cough, chest tightness and shortness of breath.   Cardiovascular: Negative for chest pain and palpitations.  Gastrointestinal: Positive for abdominal pain and constipation. Negative for nausea, vomiting and diarrhea.  Genitourinary: Negative for dysuria, frequency and menstrual problem.  Musculoskeletal: Negative for joint swelling.  Skin: Negative for rash.  Neurological: Negative for dizziness and headaches.  Psychiatric/Behavioral: Negative for dysphoric mood and agitation.    Objective:  BP 139/80 mmHg  Pulse 88  Temp(Src) 96.6 F (  35.9 C) (Oral)  Wt   BP Readings from Last 3 Encounters:  06/05/15 139/80  05/11/15 140/75  01/21/15 107/56    Wt Readings from Last 3 Encounters:  01/21/15 215 lb (97.523 kg)  03/27/14 210 lb 8.6 oz (95.5 kg)     Physical Exam    Constitutional: She is oriented to person, place, and time. She appears well-developed and well-nourished. No distress.  HENT:  Head: Normocephalic and atraumatic.  Right Ear: Tympanic membrane and external ear normal.  Left Ear: External ear normal. Tympanic membrane is scarred and perforated. A middle ear effusion is present.  Nose: Nose normal.  Mouth/Throat: Oropharynx is clear and moist.  Eyes: Conjunctivae and EOM are normal. Pupils are equal, round, and reactive to light.  Neck: Normal range of motion. Neck supple. No thyromegaly present.  Cardiovascular: Normal rate, regular rhythm and normal heart sounds.   No murmur heard. Pulmonary/Chest: Effort normal and breath sounds normal. No respiratory distress. She has no wheezes. She has no rales.  Abdominal: Soft. Bowel sounds are normal. She exhibits distension. She exhibits no mass. There is no tenderness. There is no guarding.  Musculoskeletal: She exhibits no edema or tenderness.  WC bound  Lymphadenopathy:    She has no cervical adenopathy.  Neurological: She is alert and oriented to person, place, and time. She has normal reflexes.  Skin: Skin is warm and dry.  Psychiatric: She has a normal mood and affect. Her behavior is normal. Judgment and thought content normal.    Lab Results  Component Value Date   HGBA1C 5.9* 03/25/2014    Lab Results  Component Value Date   WBC 4.9 01/21/2015   HGB 10.9* 01/21/2015   HCT 34.0* 01/21/2015   PLT 154 01/21/2015   GLUCOSE 100* 01/21/2015   CHOL 161 03/25/2014   TRIG 124 03/25/2014   HDL 48 03/25/2014   LDLCALC 88 03/25/2014   ALT 24 01/21/2015   AST 26 01/21/2015   NA 139 01/21/2015   K 4.0 01/21/2015   CL 110 01/21/2015   CREATININE 1.13* 01/21/2015   BUN 29* 01/21/2015   CO2 23 01/21/2015   HGBA1C 5.9* 03/25/2014    Dg Chest 2 View  01/21/2015  CLINICAL DATA:  74 year old female with shortness breath and productive cough for the past 2-3 days. EXAM: CHEST  2 VIEW  COMPARISON:  Chest x-ray a 03/25/2014. FINDINGS: Lung volumes are normal. No consolidative airspace disease. No pleural effusions. Mild diffuse peribronchial cuffing and interstitial prominence. Heart size is mildly enlarged. Upper mediastinal contours are within normal limits allowing for patient rotation to the left. Large hiatal hernia. Atherosclerotic calcifications in the thoracic aorta. IMPRESSION: 1. Mild diffuse peribronchial cuffing and interstitial prominence may reflect a mild bronchitis. 2. Mild cardiomegaly. 3. Atherosclerosis. 4. Large hiatal hernia. Electronically Signed   By: Vinnie Langton M.D.   On: 01/21/2015 10:36    Assessment & Plan:   Tamara Stein was seen today for hypertension.  Diagnoses and all orders for this visit:  Gastroesophageal reflux disease, esophagitis presence not specified -     Ambulatory referral to Gastroenterology -     CBC with Differential/Platelet -     CMP14+EGFR -     Lipid panel -     TSH -     T4, Free  Cerebellar degeneration -     CBC with Differential/Platelet -     CMP14+EGFR -     Lipid panel -     TSH -  T4, Free  Essential hypertension -     CBC with Differential/Platelet -     CMP14+EGFR -     Lipid panel -     TSH -     T4, Free  Slow transit constipation -     Ambulatory referral to Gastroenterology -     CBC with Differential/Platelet -     CMP14+EGFR -     Lipid panel -     TSH -     T4, Free  Depression -     CBC with Differential/Platelet -     CMP14+EGFR -     Lipid panel -     TSH -     T4, Free  Hypothyroidism, unspecified hypothyroidism type -     CBC with Differential/Platelet -     CMP14+EGFR -     Lipid panel -     TSH -     T4, Free  Hyperlipidemia -     CBC with Differential/Platelet -     CMP14+EGFR -     Lipid panel -     TSH -     T4, Free  Recurrent acute serous otitis media of left ear  Other orders -     lubiprostone (AMITIZA) 24 MCG capsule; Take 1 capsule (24 mcg total) by  mouth 2 (two) times daily with a meal. For constipation -     meclizine (ANTIVERT) 25 MG tablet; Take 1 tablet (25 mg total) by mouth 3 (three) times daily as needed for dizziness. -     DULoxetine (CYMBALTA) 60 MG capsule; Take 1 capsule (60 mg total) by mouth daily. -     levofloxacin (LEVAQUIN) 500 MG tablet; Take 1 tablet (500 mg total) by mouth daily.   I have changed Ms. Bevel's meclizine and DULoxetine. I am also having her start on lubiprostone and levofloxacin. Additionally, I am having her maintain her atorvastatin, omeprazole, levothyroxine, aspirin EC, multivitamin with minerals, Calcium Carbonate-Vitamin D (CALTRATE 600+D PO), ferrous sulfate, lisinopril-hydrochlorothiazide, diclofenac, and Linaclotide.  Meds ordered this encounter  Medications  . lubiprostone (AMITIZA) 24 MCG capsule    Sig: Take 1 capsule (24 mcg total) by mouth 2 (two) times daily with a meal. For constipation    Dispense:  180 capsule    Refill:  1  . meclizine (ANTIVERT) 25 MG tablet    Sig: Take 1 tablet (25 mg total) by mouth 3 (three) times daily as needed for dizziness.    Dispense:  90 tablet    Refill:  5  . DULoxetine (CYMBALTA) 60 MG capsule    Sig: Take 1 capsule (60 mg total) by mouth daily.    Dispense:  90 capsule    Refill:  1  . levofloxacin (LEVAQUIN) 500 MG tablet    Sig: Take 1 tablet (500 mg total) by mouth daily.    Dispense:  10 tablet    Refill:  0     Follow-up: Return in about 1 month (around 07/06/2015) for CPE.  Claretta Fraise, M.D.

## 2015-06-08 ENCOUNTER — Ambulatory Visit: Payer: Medicare Other | Admitting: Family Medicine

## 2015-06-19 ENCOUNTER — Other Ambulatory Visit (INDEPENDENT_AMBULATORY_CARE_PROVIDER_SITE_OTHER): Payer: Medicare Other

## 2015-06-19 ENCOUNTER — Ambulatory Visit (INDEPENDENT_AMBULATORY_CARE_PROVIDER_SITE_OTHER): Payer: Medicare Other | Admitting: Family Medicine

## 2015-06-19 ENCOUNTER — Encounter: Payer: Self-pay | Admitting: Family Medicine

## 2015-06-19 VITALS — BP 155/84 | HR 77 | Temp 97.1°F

## 2015-06-19 DIAGNOSIS — H6505 Acute serous otitis media, recurrent, left ear: Secondary | ICD-10-CM | POA: Diagnosis not present

## 2015-06-19 DIAGNOSIS — E785 Hyperlipidemia, unspecified: Secondary | ICD-10-CM | POA: Diagnosis not present

## 2015-06-19 DIAGNOSIS — G319 Degenerative disease of nervous system, unspecified: Secondary | ICD-10-CM | POA: Diagnosis not present

## 2015-06-19 DIAGNOSIS — K5900 Constipation, unspecified: Secondary | ICD-10-CM | POA: Diagnosis not present

## 2015-06-19 DIAGNOSIS — I10 Essential (primary) hypertension: Secondary | ICD-10-CM | POA: Diagnosis not present

## 2015-06-19 DIAGNOSIS — K219 Gastro-esophageal reflux disease without esophagitis: Secondary | ICD-10-CM | POA: Diagnosis not present

## 2015-06-19 DIAGNOSIS — E039 Hypothyroidism, unspecified: Secondary | ICD-10-CM

## 2015-06-19 DIAGNOSIS — K5901 Slow transit constipation: Secondary | ICD-10-CM

## 2015-06-19 DIAGNOSIS — R350 Frequency of micturition: Secondary | ICD-10-CM | POA: Diagnosis not present

## 2015-06-19 DIAGNOSIS — K449 Diaphragmatic hernia without obstruction or gangrene: Secondary | ICD-10-CM | POA: Diagnosis not present

## 2015-06-19 DIAGNOSIS — F05 Delirium due to known physiological condition: Secondary | ICD-10-CM | POA: Diagnosis not present

## 2015-06-19 DIAGNOSIS — N10 Acute tubulo-interstitial nephritis: Secondary | ICD-10-CM

## 2015-06-19 DIAGNOSIS — K5731 Diverticulosis of large intestine without perforation or abscess with bleeding: Secondary | ICD-10-CM | POA: Diagnosis not present

## 2015-06-19 LAB — POCT URINALYSIS DIPSTICK
Bilirubin, UA: NEGATIVE
Blood, UA: NEGATIVE
Glucose, UA: NEGATIVE
Ketones, UA: NEGATIVE
Nitrite, UA: NEGATIVE
PH UA: 6.5
PROTEIN UA: NEGATIVE
SPEC GRAV UA: 1.015
UROBILINOGEN UA: NEGATIVE

## 2015-06-19 LAB — POCT UA - MICROSCOPIC ONLY
Casts, Ur, LPF, POC: NEGATIVE
Crystals, Ur, HPF, POC: NEGATIVE
MUCUS UA: NEGATIVE
YEAST UA: NEGATIVE

## 2015-06-19 MED ORDER — CEFTRIAXONE SODIUM 1 G IJ SOLR
1.0000 g | Freq: Once | INTRAMUSCULAR | Status: AC
  Administered 2015-06-19: 1 g via INTRAMUSCULAR

## 2015-06-19 MED ORDER — NITROFURANTOIN MACROCRYSTAL 100 MG PO CAPS: 100.0000 mg | ORAL_CAPSULE | Freq: Two times a day (BID) | ORAL | Status: DC

## 2015-06-19 MED ORDER — ONDANSETRON 8 MG PO TBDP: 8.0000 mg | ORAL_TABLET | Freq: Four times a day (QID) | ORAL | Status: DC | PRN

## 2015-06-19 MED ORDER — CLINDAMYCIN HCL 300 MG PO CAPS: 300.0000 mg | ORAL_CAPSULE | Freq: Three times a day (TID) | ORAL | Status: DC

## 2015-06-19 NOTE — Patient Instructions (Signed)
For constipation use Benefiber 1 tablespoon twice daily every day for both treatment and prevention. Use Colace 100 mg twice daily as well. For acute bouts of constipation you can add MiraLAX one capful twice a day until bowels move  satisfactorily.

## 2015-06-19 NOTE — Progress Notes (Signed)
Subjective:  Patient ID: Tamara Stein, female    DOB: 05/27/1941  Age: 74 y.o. MRN: [id number]  CC: No chief complaint on file.   HPI Tamara Stein presents for dysuria and lower abdominal discomfort. She has had symptoms for about 24-48 hours. Has had nausea and vomiting as well. Some flank pain 2. She finished her levofloxacin about 5-6 days ago. Also would like to have her ear checked. Still having some discomfort described as a mild pressure. She has left ear problems chronically. 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 Additionally she is having some left shoulder pain. Daughter is concerned about dehydration. She's not been able to hold fluids down very well the last day or so. Also constipated and could not afford the medication.  History Tamara Stein has a past medical history of GERD (gastroesophageal reflux disease); Hypertension; Hyperlipidemia; Cerebellar degeneration; Chronic knee pain; Allergy; Anxiety; Cataract; Depression; Thyroid disease; and Chronic kidney disease.   She has past surgical history that includes Cholecystectomy; Abdominal hysterectomy; and RIGHT ELBOW.   Her family history includes Arthritis in her sister; Cancer in her brother; Diabetes in her brother; Heart disease in her brother; Hyperlipidemia in her sister; Hypertension in her sister.She reports that she has never smoked. She does not have any smokeless tobacco history on file. She reports that she does not drink alcohol or use illicit drugs.  Outpatient Prescriptions Prior to Visit  Medication Sig Dispense Refill  . aspirin EC 81 MG tablet Take 81 mg by mouth every morning.     Marland Kitchen atorvastatin (LIPITOR) 40 MG tablet Take 40 mg by mouth at bedtime.    . Calcium Carbonate-Vitamin D (CALTRATE 600+D PO) Take 1 tablet by mouth daily.    . diclofenac (VOLTAREN) 50 MG EC tablet Take 50 mg by mouth 3 (three) times daily.    . DULoxetine (CYMBALTA) 60 MG capsule Take 1 capsule (60 mg  total) by mouth daily. 90 capsule 1  . ferrous sulfate 325 (65 FE) MG tablet Take 325 mg by mouth daily with breakfast.    . levofloxacin (LEVAQUIN) 500 MG tablet Take 1 tablet (500 mg total) by mouth daily. 10 tablet 0  . levothyroxine (SYNTHROID, LEVOTHROID) 25 MCG tablet Take 50 mcg by mouth daily before breakfast.     . Linaclotide (LINZESS) 145 MCG CAPS capsule Take 1 capsule (145 mcg total) by mouth daily. 30 capsule 5  . lisinopril-hydrochlorothiazide (PRINZIDE,ZESTORETIC) 20-12.5 MG per tablet Take 1 tablet by mouth daily.    Marland Kitchen lubiprostone (AMITIZA) 24 MCG capsule Take 1 capsule (24 mcg total) by mouth 2 (two) times daily with a meal. For constipation 180 capsule 1  . meclizine (ANTIVERT) 25 MG tablet Take 1 tablet (25 mg total) by mouth 3 (three) times daily as needed for dizziness. 90 tablet 5  . Multiple Vitamin (MULTIVITAMIN WITH MINERALS) TABS tablet Take 1 tablet by mouth daily.    Marland Kitchen omeprazole (PRILOSEC) 40 MG capsule Take 40 mg by mouth daily.     No facility-administered medications prior to visit.    ROS Review of Systems  Constitutional: Negative for fever, chills, diaphoresis, appetite change, fatigue and unexpected weight change.  HENT: Negative for congestion, ear pain, hearing loss, postnasal drip, rhinorrhea, sneezing, sore throat and trouble swallowing.   Eyes: Negative for pain.  Respiratory: Negative for cough, chest tightness and shortness of breath.   Cardiovascular: Negative for chest pain and palpitations.  Gastrointestinal: Positive for abdominal pain. Negative for nausea, vomiting, diarrhea  and constipation.  Genitourinary: Negative for dysuria, frequency and menstrual problem.  Musculoskeletal: Negative for joint swelling and arthralgias.  Skin: Negative for rash.  Neurological: Negative for dizziness, weakness, numbness and headaches.  Psychiatric/Behavioral: Negative for dysphoric mood and agitation.    Objective:  BP 155/84 mmHg  Pulse 77   Temp(Src) 97.1 F (36.2 C) (Oral)  Wt   SpO2 100%  BP Readings from Last 3 Encounters:  06/19/15 155/84  06/05/15 139/80  05/11/15 140/75    Wt Readings from Last 3 Encounters:  01/21/15 215 lb (97.523 kg)  03/27/14 210 lb 8.6 oz (95.5 kg)     Physical Exam  Constitutional: She is oriented to person, place, and time. She appears well-developed and well-nourished. No distress.  HENT:  Head: Normocephalic and atraumatic.  Right Ear: External ear normal.  Left Ear: External ear normal. Tympanic membrane is perforated and retracted. A middle ear effusion is present.  Nose: Nose normal.  Mouth/Throat: Oropharynx is clear and moist.  Eyes: Conjunctivae and EOM are normal. Pupils are equal, round, and reactive to light.  Neck: Normal range of motion. Neck supple. No thyromegaly present.  Cardiovascular: Normal rate, regular rhythm and normal heart sounds.   No murmur heard. Pulmonary/Chest: Effort normal and breath sounds normal. No respiratory distress. She has no wheezes. She has no rales.  Abdominal: Soft. Bowel sounds are normal. She exhibits no distension. There is no tenderness. There is no rebound and no guarding.  Musculoskeletal:  Wheelchair bound   Lymphadenopathy:    She has no cervical adenopathy.  Neurological: She is alert and oriented to person, place, and time. She has normal reflexes. No cranial nerve deficit.  Skin: Skin is warm and dry.  Psychiatric: She has a normal mood and affect. Her behavior is normal.    Lab Results  Component Value Date   HGBA1C 5.9* 03/25/2014    Lab Results  Component Value Date   WBC 5.4 06/19/2015   HGB 10.9* 01/21/2015   HCT 31.8* 06/19/2015   PLT 154 01/21/2015   GLUCOSE 98 06/19/2015   CHOL 138 06/19/2015   TRIG 106 06/19/2015   HDL 44 06/19/2015   LDLCALC 73 06/19/2015   ALT 18 06/19/2015   AST 21 06/19/2015   NA 144 06/19/2015   K 4.9 06/19/2015   CL 103 06/19/2015   CREATININE 1.25* 06/19/2015   BUN 35*  06/19/2015   CO2 24 06/19/2015   TSH 3.420 06/19/2015   HGBA1C 5.9* 03/25/2014    Dg Chest 2 View  01/21/2015  CLINICAL DATA:  74 year old female with shortness breath and productive cough for the past 2-3 days. EXAM: CHEST  2 VIEW COMPARISON:  Chest x-ray a 03/25/2014. FINDINGS: Lung volumes are normal. No consolidative airspace disease. No pleural effusions. Mild diffuse peribronchial cuffing and interstitial prominence. Heart size is mildly enlarged. Upper mediastinal contours are within normal limits allowing for patient rotation to the left. Large hiatal hernia. Atherosclerotic calcifications in the thoracic aorta. IMPRESSION: 1. Mild diffuse peribronchial cuffing and interstitial prominence may reflect a mild bronchitis. 2. Mild cardiomegaly. 3. Atherosclerosis. 4. Large hiatal hernia. Electronically Signed   By: Vinnie Langton M.D.   On: 01/21/2015 10:36   Results for orders placed or performed in visit on 06/19/15  Urine culture  Result Value Ref Range   Urine Culture, Routine Final report    Urine Culture result 1 Comment   POCT urinalysis dipstick  Result Value Ref Range   Color, UA gold  Clarity, UA clear    Glucose, UA neg    Bilirubin, UA neg    Ketones, UA neg    Spec Grav, UA 1.015    Blood, UA neg    pH, UA 6.5    Protein, UA neg    Urobilinogen, UA negative    Nitrite, UA neg    Leukocytes, UA large (3+) (A) Negative  POCT UA - Microscopic Only  Result Value Ref Range   WBC, Ur, HPF, POC 10-20    RBC, urine, microscopic 1-5    Bacteria, U Microscopic few    Mucus, UA neg    Epithelial cells, urine per micros many    Crystals, Ur, HPF, POC neg    Casts, Ur, LPF, POC neg    Yeast, UA neg     Assessment & Plan:   Diagnoses and all orders for this visit:  Acute pyelonephritis -     cefTRIAXone (ROCEPHIN) injection 1 g; Inject 1 g into the muscle once. 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 -     Cancel: POCT CBC -     Cancel: CMP14+EGFR  Cerebellar degeneration -     Cancel: POCT CBC -     Cancel: CMP14+EGFR  Other orders -     clindamycin (CLEOCIN) 300 MG capsule; Take 1 capsule (300 mg total) by mouth 3 (three) times daily. For ear infection -     nitrofurantoin (MACRODANTIN) 100 MG capsule; Take 1 capsule (100 mg total) by mouth 2 (two) times daily. -     ondansetron (ZOFRAN-ODT) 8 MG disintegrating tablet; Take 1 tablet (8 mg total) by mouth every 6 (six) hours as needed for nausea or vomiting.   I am having Ms. Trinidad start on clindamycin, nitrofurantoin, and ondansetron. I am also having her maintain her atorvastatin, omeprazole, levothyroxine, aspirin EC, multivitamin with minerals, Calcium Carbonate-Vitamin D (CALTRATE 600+D PO), ferrous sulfate, lisinopril-hydrochlorothiazide, diclofenac, Linaclotide, lubiprostone, meclizine, DULoxetine, and levofloxacin. We administered cefTRIAXone.  Meds ordered this encounter  Medications  . clindamycin (CLEOCIN) 300 MG capsule    Sig: Take 1 capsule (300 mg total) by mouth 3 (three) times daily. For ear infection    Dispense:  30 capsule    Refill:  0  . cefTRIAXone (ROCEPHIN) injection 1 g    Sig:   . nitrofurantoin (MACRODANTIN) 100 MG capsule    Sig: Take 1 capsule (100 mg total) by mouth 2 (two) times daily.    Dispense:  14 capsule    Refill:  0  . ondansetron (ZOFRAN-ODT) 8 MG disintegrating tablet    Sig: Take 1 tablet (8 mg total) by mouth every 6 (six) hours as needed for nausea or vomiting.    Dispense:   20 tablet    Refill:  1  Follow-up: Return in about 1 week (around 06/26/2015) for Left shoulder pain.  Claretta Fraise, M.D.

## 2015-06-20 ENCOUNTER — Telehealth: Payer: Self-pay | Admitting: *Deleted

## 2015-06-20 LAB — CBC WITH DIFFERENTIAL/PLATELET
Basophils Absolute: 0.1 10*3/uL (ref 0.0–0.2)
Basos: 1 %
EOS (ABSOLUTE): 0.7 10*3/uL — ABNORMAL HIGH (ref 0.0–0.4)
EOS: 12 %
HEMATOCRIT: 31.8 % — AB (ref 34.0–46.6)
HEMOGLOBIN: 10.2 g/dL — AB (ref 11.1–15.9)
Immature Grans (Abs): 0 10*3/uL (ref 0.0–0.1)
Immature Granulocytes: 0 %
LYMPHS ABS: 1.2 10*3/uL (ref 0.7–3.1)
Lymphs: 22 %
MCH: 30.3 pg (ref 26.6–33.0)
MCHC: 32.1 g/dL (ref 31.5–35.7)
MCV: 94 fL (ref 79–97)
MONOS ABS: 0.5 10*3/uL (ref 0.1–0.9)
Monocytes: 10 %
Neutrophils Absolute: 3 10*3/uL (ref 1.4–7.0)
Neutrophils: 55 %
Platelets: 202 10*3/uL (ref 150–379)
RBC: 3.37 x10E6/uL — AB (ref 3.77–5.28)
RDW: 14.3 % (ref 12.3–15.4)
WBC: 5.4 10*3/uL (ref 3.4–10.8)

## 2015-06-20 LAB — CMP14+EGFR
A/G RATIO: 1.4 (ref 1.1–2.5)
ALBUMIN: 3.5 g/dL (ref 3.5–4.8)
ALK PHOS: 66 IU/L (ref 39–117)
ALT: 18 IU/L (ref 0–32)
AST: 21 IU/L (ref 0–40)
BUN / CREAT RATIO: 28 — AB (ref 11–26)
BUN: 35 mg/dL — ABNORMAL HIGH (ref 8–27)
Bilirubin Total: 0.3 mg/dL (ref 0.0–1.2)
CO2: 24 mmol/L (ref 18–29)
CREATININE: 1.25 mg/dL — AB (ref 0.57–1.00)
Calcium: 9.2 mg/dL (ref 8.7–10.3)
Chloride: 103 mmol/L (ref 97–106)
GFR calc Af Amer: 49 mL/min/{1.73_m2} — ABNORMAL LOW (ref >59)
GFR calc non Af Amer: 42 mL/min/{1.73_m2} — ABNORMAL LOW (ref >59)
GLOBULIN, TOTAL: 2.5 g/dL (ref 1.5–4.5)
Glucose: 98 mg/dL (ref 65–99)
Potassium: 4.9 mmol/L (ref 3.5–5.2)
SODIUM: 144 mmol/L (ref 136–144)
Total Protein: 6 g/dL (ref 6.0–8.5)

## 2015-06-20 LAB — LIPID PANEL
CHOL/HDL RATIO: 3.1 ratio (ref 0.0–4.4)
Cholesterol, Total: 138 mg/dL (ref 100–199)
HDL: 44 mg/dL (ref >39)
LDL CALC: 73 mg/dL (ref 0–99)
Triglycerides: 106 mg/dL (ref 0–149)
VLDL Cholesterol Cal: 21 mg/dL (ref 5–40)

## 2015-06-20 LAB — T4, FREE: FREE T4: 1.35 ng/dL (ref 0.82–1.77)

## 2015-06-20 LAB — TSH: TSH: 3.42 u[IU]/mL (ref 0.450–4.500)

## 2015-06-20 NOTE — Telephone Encounter (Signed)
-----   Message from Claretta Fraise, MD sent at 06/20/2015  2:20 PM EST ----- Marjory Lies,    Your lab result is stablel.Some minor variations that are not significant are commonly marked abnormal, but do not represent any medical problem for you.  Best regards, Claretta Fraise, M.D.

## 2015-06-20 NOTE — Telephone Encounter (Signed)
Pt notified of results Verbalizes understanding 

## 2015-06-21 LAB — URINE CULTURE

## 2015-06-27 ENCOUNTER — Ambulatory Visit (INDEPENDENT_AMBULATORY_CARE_PROVIDER_SITE_OTHER): Payer: Medicare Other | Admitting: Family Medicine

## 2015-06-27 ENCOUNTER — Ambulatory Visit (INDEPENDENT_AMBULATORY_CARE_PROVIDER_SITE_OTHER): Payer: Medicare Other

## 2015-06-27 ENCOUNTER — Encounter: Payer: Self-pay | Admitting: Family Medicine

## 2015-06-27 VITALS — BP 139/76 | HR 81 | Temp 97.1°F

## 2015-06-27 DIAGNOSIS — Z780 Asymptomatic menopausal state: Secondary | ICD-10-CM | POA: Diagnosis not present

## 2015-06-27 DIAGNOSIS — Z0000 Encounter for general adult medical examination without abnormal findings: Secondary | ICD-10-CM

## 2015-06-27 MED ORDER — LEVOTHYROXINE SODIUM 50 MCG PO TABS: 50.0000 ug | ORAL_TABLET | Freq: Every day | ORAL | Status: DC

## 2015-06-27 NOTE — Progress Notes (Signed)
Subjective:  Patient ID: Tamara Stein, female    DOB: [birth year]  Age: 74 y.o. MRN: [id number]  CC: Annual Exam   HPI Tamara Stein presents for medicare annual exam.  History Tamara Stein has a past medical history of GERD (gastroesophageal reflux disease); Hypertension; Hyperlipidemia; Cerebellar degeneration; Chronic knee pain; Allergy; Anxiety; Cataract; Depression; Thyroid disease; and Chronic kidney disease.   She has past surgical history that includes Cholecystectomy; Abdominal hysterectomy; and RIGHT ELBOW.   Her family history includes Arthritis in her sister; Cancer in her brother; Diabetes in her brother; Heart disease in her brother; Hyperlipidemia in her sister; Hypertension in her sister.She reports that she has never smoked. She does not have any smokeless tobacco history on file. She reports that she does not drink alcohol or use illicit drugs.  Outpatient Prescriptions Prior to Visit  Medication Sig Dispense Refill  . aspirin EC 81 MG tablet Take 81 mg by mouth every morning.     Marland Kitchen atorvastatin (LIPITOR) 40 MG tablet Take 40 mg by mouth at bedtime.    . Calcium Carbonate-Vitamin D (CALTRATE 600+D PO) Take 1 tablet by mouth daily.    . diclofenac (VOLTAREN) 50 MG EC tablet Take 50 mg by mouth 3 (three) times daily.    . DULoxetine (CYMBALTA) 60 MG capsule Take 1 capsule (60 mg total) by mouth daily. 90 capsule 1  . ferrous sulfate 325 (65 FE) MG tablet Take 325 mg by mouth daily with breakfast.    . lisinopril-hydrochlorothiazide (PRINZIDE,ZESTORETIC) 20-12.5 MG per tablet Take 1 tablet by mouth daily.    . meclizine (ANTIVERT) 25 MG tablet Take 1 tablet (25 mg total) by mouth 3 (three) times daily as needed for dizziness. 90 tablet 5  . Multiple Vitamin (MULTIVITAMIN WITH MINERALS) TABS tablet Take 1 tablet by mouth daily.    Marland Kitchen omeprazole (PRILOSEC) 40 MG capsule Take 40 mg by mouth daily.    . ondansetron (ZOFRAN-ODT) 8 MG disintegrating tablet Take 1 tablet (8 mg  total) by mouth every 6 (six) hours as needed for nausea or vomiting. 20 tablet 1  . clindamycin (CLEOCIN) 300 MG capsule Take 1 capsule (300 mg total) by mouth 3 (three) times daily. For ear infection 30 capsule 0  . levothyroxine (SYNTHROID, LEVOTHROID) 25 MCG tablet Take 50 mcg by mouth daily before breakfast.     . levofloxacin (LEVAQUIN) 500 MG tablet Take 1 tablet (500 mg total) by mouth daily. (Patient not taking: Reported on 06/27/2015) 10 tablet 0  . Linaclotide (LINZESS) 145 MCG CAPS capsule Take 1 capsule (145 mcg total) by mouth daily. (Patient not taking: Reported on 06/27/2015) 30 capsule 5  . lubiprostone (AMITIZA) 24 MCG capsule Take 1 capsule (24 mcg total) by mouth 2 (two) times daily with a meal. For constipation (Patient not taking: Reported on 06/27/2015) 180 capsule 1  . nitrofurantoin (MACRODANTIN) 100 MG capsule Take 1 capsule (100 mg total) by mouth 2 (two) times daily. (Patient not taking: Reported on 06/27/2015) 14 capsule 0   No facility-administered medications prior to visit.    ROS Review of Systems  Constitutional: Negative for fever, chills, diaphoresis, appetite change, fatigue and unexpected weight change.  HENT: Negative for congestion, ear pain, hearing loss, postnasal drip, rhinorrhea, sneezing, sore throat and trouble swallowing.   Eyes: Negative for pain.  Respiratory: Negative for cough, chest tightness and shortness of breath.   Cardiovascular: Negative for chest pain and palpitations.  Gastrointestinal: Negative for nausea, vomiting, abdominal pain, diarrhea and constipation.  Genitourinary:  Negative for dysuria, frequency and menstrual problem.  Musculoskeletal: Negative for joint swelling and arthralgias.  Skin: Negative for rash.  Neurological: Negative for dizziness, weakness, numbness and headaches.  Psychiatric/Behavioral: Negative for dysphoric mood and agitation.    Objective:  BP 139/76 mmHg  Pulse 81  Temp(Src) 97.1 F (36.2 C) (Oral)   SpO2 99%  BP Readings from Last 3 Encounters:  06/27/15 139/76  06/19/15 155/84  06/05/15 139/80    Wt Readings from Last 3 Encounters:  01/21/15 215 lb (97.523 kg)  03/27/14 210 lb 8.6 oz (95.5 kg)     Physical Exam  Constitutional: She is oriented to person, place, and time. She appears well-developed and well-nourished. No distress.  HENT:  Head: Normocephalic and atraumatic.  Right Ear: External ear normal.  Left Ear: External ear normal.  Nose: Nose normal.  Mouth/Throat: Oropharynx is clear and moist.  Eyes: Conjunctivae and EOM are normal. Pupils are equal, round, and reactive to light.  Neck: Normal range of motion. Neck supple. No thyromegaly present.  Cardiovascular: Normal rate, regular rhythm and normal heart sounds.   No murmur heard. Pulmonary/Chest: Effort normal and breath sounds normal. No respiratory distress. She has no wheezes. She has no rales.  Abdominal: Soft. Bowel sounds are normal. She exhibits no distension. There is no tenderness.  Musculoskeletal:  wc bound  Lymphadenopathy:    She has no cervical adenopathy.  Neurological: She is alert and oriented to person, place, and time. She has normal reflexes.  Skin: Skin is warm and dry.  Psychiatric: She has a normal mood and affect. Her behavior is normal. Judgment and thought content normal.    Lab Results  Component Value Date   HGBA1C 5.9* 03/25/2014    Lab Results  Component Value Date   WBC 5.4 06/19/2015   HGB 10.9* 01/21/2015   HCT 31.8* 06/19/2015   PLT 154 01/21/2015   GLUCOSE 98 06/19/2015   CHOL 138 06/19/2015   TRIG 106 06/19/2015   HDL 44 06/19/2015   LDLCALC 73 06/19/2015   ALT 18 06/19/2015   AST 21 06/19/2015   NA 144 06/19/2015   K 4.9 06/19/2015   CL 103 06/19/2015   CREATININE 1.25* 06/19/2015   BUN 35* 06/19/2015   CO2 24 06/19/2015   TSH 3.420 06/19/2015   HGBA1C 5.9* 03/25/2014    Dg Chest 2 View  01/21/2015  CLINICAL DATA:  74 year old female with  shortness breath and productive cough for the past 2-3 days. EXAM: CHEST  2 VIEW COMPARISON:  Chest x-ray a 03/25/2014. FINDINGS: Lung volumes are normal. No consolidative airspace disease. No pleural effusions. Mild diffuse peribronchial cuffing and interstitial prominence. Heart size is mildly enlarged. Upper mediastinal contours are within normal limits allowing for patient rotation to the left. Large hiatal hernia. Atherosclerotic calcifications in the thoracic aorta. IMPRESSION: 1. Mild diffuse peribronchial cuffing and interstitial prominence may reflect a mild bronchitis. 2. Mild cardiomegaly. 3. Atherosclerosis. 4. Large hiatal hernia. Electronically Signed   By: Vinnie Langton M.D.   On: 01/21/2015 10:36    Assessment & Plan:   Tamara Stein was seen today for annual exam.  Diagnoses and all orders for this visit:  Postmenopausal -     DG Bone Density  Other orders -     levothyroxine (SYNTHROID, LEVOTHROID) 50 MCG tablet; Take 1 tablet (50 mcg total) by mouth daily before breakfast.   I have discontinued Tamara Stein's Linaclotide, lubiprostone, levofloxacin, clindamycin, and nitrofurantoin. I have also changed her levothyroxine. Additionally, I  am having her maintain her atorvastatin, omeprazole, aspirin EC, multivitamin with minerals, Calcium Carbonate-Vitamin D (CALTRATE 600+D PO), ferrous sulfate, lisinopril-hydrochlorothiazide, diclofenac, meclizine, DULoxetine, and ondansetron.  Meds ordered this encounter  Medications  . levothyroxine (SYNTHROID, LEVOTHROID) 50 MCG tablet    Sig: Take 1 tablet (50 mcg total) by mouth daily before breakfast.    Dispense:  90 tablet    Refill:  3      Tamara Stein , Thank you for taking time to come for your Medicare Wellness Visit. I appreciate your ongoing commitment to your health goals. Please review the following plan we discussed and let me know if I can assist you in the future.   These are the goals we discussed: Goals    . Have  3 meals a day    . Increase water intake    . Reduce calorie intake to 2000 calories per day       This is a list of the screening recommended for you and due dates:  Health Maintenance  Topic Date Due  . Colon Cancer Screening  09/19/2015*  . Shingles Vaccine  09/19/2015*  . Tetanus Vaccine  09/19/2015*  . Pneumonia vaccines (1 of 2 - PCV13) 09/19/2015*  . Flu Shot  03/11/2016  . DEXA scan (bone density measurement)  Completed  *Topic was postponed. The date shown is not the original due date.    The patient has a history of falls. I did complete a risk assessment for falls. A plan of care for falls was documented.  Depression screen PHQ 2/9 05/11/2015  Decreased Interest 1  Down, Depressed, Hopeless 1  PHQ - 2 Score 2  Altered sleeping 0  Tired, decreased energy 0  Change in appetite 0  Feeling bad or failure about yourself  0  Trouble concentrating 0  Moving slowly or fidgety/restless 0  Suicidal thoughts 0  PHQ-9 Score 2     Follow-up: Return in about 3 months (around 09/27/2015) for Hypothyroidism, hypertension.  Claretta Fraise, M.D.

## 2015-06-28 ENCOUNTER — Other Ambulatory Visit (INDEPENDENT_AMBULATORY_CARE_PROVIDER_SITE_OTHER): Payer: Medicare Other

## 2015-06-28 DIAGNOSIS — R350 Frequency of micturition: Secondary | ICD-10-CM

## 2015-06-28 LAB — POCT URINALYSIS DIPSTICK
Bilirubin, UA: NEGATIVE
GLUCOSE UA: NEGATIVE
KETONES UA: 1.025
NITRITE UA: NEGATIVE
PROTEIN UA: NEGATIVE
RBC UA: NEGATIVE
Spec Grav, UA: 1.025
UROBILINOGEN UA: NEGATIVE
pH, UA: 6

## 2015-06-28 NOTE — Progress Notes (Signed)
Lab only 

## 2015-07-01 LAB — URINE CULTURE

## 2015-07-02 ENCOUNTER — Other Ambulatory Visit: Payer: Self-pay | Admitting: Family Medicine

## 2015-07-02 ENCOUNTER — Telehealth: Payer: Self-pay | Admitting: Family Medicine

## 2015-07-02 MED ORDER — RALOXIFENE HCL 60 MG PO TABS: 60.0000 mg | ORAL_TABLET | Freq: Every day | ORAL | Status: DC

## 2015-07-02 MED ORDER — NITROFURANTOIN MONOHYD MACRO 100 MG PO CAPS: 100.0000 mg | ORAL_CAPSULE | Freq: Two times a day (BID) | ORAL | Status: DC

## 2015-07-02 NOTE — Telephone Encounter (Signed)
Pt aware.

## 2015-07-02 NOTE — Telephone Encounter (Signed)
[facility] scrip sent to [facility]

## 2015-07-25 ENCOUNTER — Ambulatory Visit: Payer: Medicare Other | Admitting: Family Medicine

## 2015-08-08 ENCOUNTER — Telehealth: Payer: Self-pay | Admitting: Family Medicine

## 2015-08-08 ENCOUNTER — Other Ambulatory Visit (INDEPENDENT_AMBULATORY_CARE_PROVIDER_SITE_OTHER): Payer: Medicare Other

## 2015-08-08 DIAGNOSIS — N390 Urinary tract infection, site not specified: Secondary | ICD-10-CM

## 2015-08-08 LAB — POCT URINALYSIS DIPSTICK
Bilirubin, UA: NEGATIVE
GLUCOSE UA: NEGATIVE
KETONES UA: NEGATIVE
Leukocytes, UA: NEGATIVE
Nitrite, UA: POSITIVE
Protein, UA: NEGATIVE
RBC UA: NEGATIVE
SPEC GRAV UA: 1.01
UROBILINOGEN UA: NEGATIVE
pH, UA: 6

## 2015-08-08 LAB — POCT UA - MICROSCOPIC ONLY
CASTS, UR, LPF, POC: NEGATIVE
CRYSTALS, UR, HPF, POC: NEGATIVE
MUCUS UA: NEGATIVE
YEAST UA: NEGATIVE

## 2015-08-08 NOTE — Progress Notes (Signed)
Lab only 

## 2015-08-08 NOTE — Telephone Encounter (Signed)
Urine culture result not back.

## 2015-08-09 ENCOUNTER — Other Ambulatory Visit: Payer: Medicare Other

## 2015-08-09 DIAGNOSIS — N390 Urinary tract infection, site not specified: Secondary | ICD-10-CM

## 2015-08-09 NOTE — Addendum Note (Signed)
Addended by: Karle Plumber on: 08/09/2015 09:18 AM   Modules accepted: Orders

## 2015-08-11 ENCOUNTER — Other Ambulatory Visit: Payer: Self-pay | Admitting: Family Medicine

## 2015-08-11 LAB — URINE CULTURE

## 2015-08-11 MED ORDER — NITROFURANTOIN MONOHYD MACRO 100 MG PO CAPS: 100.0000 mg | ORAL_CAPSULE | Freq: Two times a day (BID) | ORAL | Status: DC

## 2015-08-23 ENCOUNTER — Other Ambulatory Visit: Payer: Self-pay | Admitting: Family Medicine

## 2015-08-24 MED ORDER — MECLIZINE HCL 25 MG PO TABS: 25.0000 mg | ORAL_TABLET | Freq: Three times a day (TID) | ORAL | Status: DC | PRN

## 2015-08-24 MED ORDER — DULOXETINE HCL 60 MG PO CPEP: 60.0000 mg | ORAL_CAPSULE | Freq: Every day | ORAL | Status: DC

## 2015-08-24 MED ORDER — RALOXIFENE HCL 60 MG PO TABS: 60.0000 mg | ORAL_TABLET | Freq: Every day | ORAL | Status: DC

## 2015-08-24 MED ORDER — ATORVASTATIN CALCIUM 40 MG PO TABS: 40.0000 mg | ORAL_TABLET | Freq: Every day | ORAL | Status: DC

## 2015-08-24 NOTE — Telephone Encounter (Signed)
done

## 2015-08-28 ENCOUNTER — Ambulatory Visit: Payer: Medicare Other

## 2015-08-29 ENCOUNTER — Ambulatory Visit (INDEPENDENT_AMBULATORY_CARE_PROVIDER_SITE_OTHER): Payer: Medicare Other | Admitting: Family Medicine

## 2015-08-29 ENCOUNTER — Encounter: Payer: Self-pay | Admitting: Family Medicine

## 2015-08-29 VITALS — BP 145/81 | HR 87 | Temp 97.1°F

## 2015-08-29 DIAGNOSIS — N390 Urinary tract infection, site not specified: Secondary | ICD-10-CM

## 2015-08-29 DIAGNOSIS — R531 Weakness: Secondary | ICD-10-CM | POA: Diagnosis not present

## 2015-08-29 DIAGNOSIS — R319 Hematuria, unspecified: Secondary | ICD-10-CM

## 2015-08-29 LAB — POCT UA - MICROSCOPIC ONLY
Casts, Ur, LPF, POC: NEGATIVE
Crystals, Ur, HPF, POC: NEGATIVE
MUCUS UA: NEGATIVE
YEAST UA: NEGATIVE

## 2015-08-29 LAB — POCT URINALYSIS DIPSTICK
Bilirubin, UA: NEGATIVE
Glucose, UA: NEGATIVE
Ketones, UA: NEGATIVE
NITRITE UA: POSITIVE
UROBILINOGEN UA: NEGATIVE
pH, UA: 5

## 2015-08-29 MED ORDER — CIPROFLOXACIN HCL 250 MG PO TABS: 250.0000 mg | ORAL_TABLET | Freq: Two times a day (BID) | ORAL | Status: DC

## 2015-08-29 NOTE — Progress Notes (Signed)
Subjective:  Patient ID: Tamara Stein, female    DOB: Sep 23, 1940  Age: 75 y.o. MRN: [id number]  CC: Fatigue and Altered Mental Status   HPI Tamara Stein presents for Daughter states pt isn't herself. She is confuseed at times. Staes she is listless. Subtle, but different due to being wheelchair bound. Appetite is good. No fever. No nausea or flank pain. Concerned that urine might be infected.   History Tamara Stein has a past medical history of GERD (gastroesophageal reflux disease); Hypertension; Hyperlipidemia; Cerebellar degeneration; Chronic knee pain; Allergy; Anxiety; Cataract; Depression; Thyroid disease; and Chronic kidney disease.   She has past surgical history that includes Cholecystectomy; Abdominal hysterectomy; and RIGHT ELBOW.   Her family history includes Arthritis in her sister; Cancer in her brother; Diabetes in her brother; Heart disease in her brother; Hyperlipidemia in her sister; Hypertension in her sister.She reports that she has never smoked. She does not have any smokeless tobacco history on file. She reports that she does not drink alcohol or use illicit drugs.  Outpatient Prescriptions Prior to Visit  Medication Sig Dispense Refill  . aspirin EC 81 MG tablet Take 81 mg by mouth every morning.     Marland Kitchen atorvastatin (LIPITOR) 40 MG tablet Take 1 tablet (40 mg total) by mouth at bedtime. 90 tablet 1  . Calcium Carbonate-Vitamin D (CALTRATE 600+D PO) Take 1 tablet by mouth daily.    . diclofenac (VOLTAREN) 50 MG EC tablet Take 50 mg by mouth 3 (three) times daily.    . DULoxetine (CYMBALTA) 60 MG capsule Take 1 capsule (60 mg total) by mouth daily. 90 capsule 0  . ferrous sulfate 325 (65 FE) MG tablet Take 325 mg by mouth daily with breakfast.    . levothyroxine (SYNTHROID, LEVOTHROID) 50 MCG tablet Take 1 tablet (50 mcg total) by mouth daily before breakfast. 90 tablet 3  . lisinopril-hydrochlorothiazide (PRINZIDE,ZESTORETIC) 20-12.5 MG per tablet Take 1 tablet by  mouth daily.    . meclizine (ANTIVERT) 25 MG tablet Take 1 tablet (25 mg total) by mouth 3 (three) times daily as needed for dizziness. 270 tablet 0  . Multiple Vitamin (MULTIVITAMIN WITH MINERALS) TABS tablet Take 1 tablet by mouth daily.    Marland Kitchen omeprazole (PRILOSEC) 40 MG capsule Take 40 mg by mouth daily.    . raloxifene (EVISTA) 60 MG tablet Take 1 tablet (60 mg total) by mouth daily. 90 tablet 1  . nitrofurantoin, macrocrystal-monohydrate, (MACROBID) 100 MG capsule Take 1 capsule (100 mg total) by mouth 2 (two) times daily. 14 capsule 0  . ondansetron (ZOFRAN-ODT) 8 MG disintegrating tablet Take 1 tablet (8 mg total) by mouth every 6 (six) hours as needed for nausea or vomiting. 20 tablet 1   No facility-administered medications prior to visit.    ROS Review of Systems  Constitutional: Negative for fever, chills and diaphoresis.  HENT: Negative for congestion.   Eyes: Negative for visual disturbance.  Respiratory: Negative for cough and shortness of breath.   Cardiovascular: Negative for chest pain and palpitations.  Gastrointestinal: Negative for nausea, diarrhea and constipation.  Genitourinary: Positive for dysuria, urgency and frequency. Negative for hematuria, flank pain, decreased urine volume, menstrual problem and pelvic pain.  Musculoskeletal: Negative for joint swelling and arthralgias.  Skin: Negative for rash.  Neurological: Negative for dizziness and numbness.    Objective:  BP 145/81 mmHg  Pulse 87  Temp(Src) 97.1 F (36.2 C) (Oral)  SpO2 100%  BP Readings from Last 3 Encounters:  08/29/15 145/81  06/27/15  139/76  06/19/15 155/84    Wt Readings from Last 3 Encounters:  01/21/15 215 lb (97.523 kg)  03/27/14 210 lb 8.6 oz (95.5 kg)     Physical Exam  Constitutional: She is oriented to person, place, and time. She appears well-developed and well-nourished. No distress.  HENT:  Head: Normocephalic and atraumatic.  Right Ear: External ear normal.  Left  Ear: External ear normal.  Nose: Nose normal.  Mouth/Throat: Oropharynx is clear and moist.  Eyes: Conjunctivae and EOM are normal. Pupils are equal, round, and reactive to light.  Neck: Normal range of motion. Neck supple. No thyromegaly present.  Cardiovascular: Normal rate, regular rhythm and normal heart sounds.   No murmur heard. Pulmonary/Chest: Effort normal and breath sounds normal. No respiratory distress. She has no wheezes. She has no rales.  Abdominal: Soft. Bowel sounds are normal. She exhibits no distension and no mass. There is no tenderness. There is no rebound and no guarding.  Musculoskeletal: She exhibits no tenderness.  Lymphadenopathy:    She has no cervical adenopathy.  Neurological: She is alert and oriented to person, place, and time. She has normal reflexes. She exhibits abnormal muscle tone. Coordination abnormal.  Skin: Skin is warm and dry.  Psychiatric: She has a normal mood and affect. Her behavior is normal. Judgment and thought content normal.     Lab Results  Component Value Date   WBC 5.6 08/29/2015   HGB 10.9* 01/21/2015   HCT 31.0* 08/29/2015   PLT 222 08/29/2015   GLUCOSE 100* 08/29/2015   CHOL 138 06/19/2015   TRIG 106 06/19/2015   HDL 44 06/19/2015   LDLCALC 73 06/19/2015   ALT 20 08/29/2015   AST 20 08/29/2015   NA 143 08/29/2015   K 5.1 08/29/2015   CL 104 08/29/2015   CREATININE 1.28* 08/29/2015   BUN 31* 08/29/2015   CO2 26 08/29/2015   TSH 3.420 06/19/2015   HGBA1C 5.9* 03/25/2014    Dg Chest 2 View  01/21/2015  CLINICAL DATA:  75 year old female with shortness breath and productive cough for the past 2-3 days. EXAM: CHEST  2 VIEW COMPARISON:  Chest x-ray a 03/25/2014. FINDINGS: Lung volumes are normal. No consolidative airspace disease. No pleural effusions. Mild diffuse peribronchial cuffing and interstitial prominence. Heart size is mildly enlarged. Upper mediastinal contours are within normal limits allowing for patient  rotation to the left. Large hiatal hernia. Atherosclerotic calcifications in the thoracic aorta. IMPRESSION: 1. Mild diffuse peribronchial cuffing and interstitial prominence may reflect a mild bronchitis. 2. Mild cardiomegaly. 3. Atherosclerosis. 4. Large hiatal hernia. Electronically Signed   By: Vinnie Langton M.D.   On: 01/21/2015 10:36  Results for orders placed or performed in visit on 08/29/15  Urine culture  Result Value Ref Range   Urine Culture, Routine Final report (A)    Urine Culture result 1 Klebsiella pneumoniae (A)    RESULT 2 Comment    ANTIMICROBIAL SUSCEPTIBILITY Comment   CBC with Differential/Platelet  Result Value Ref Range   WBC 5.6 3.4 - 10.8 x10E3/uL   RBC 3.25 (L) 3.77 - 5.28 x10E6/uL   Hemoglobin 10.0 (L) 11.1 - 15.9 g/dL   Hematocrit 31.0 (L) 34.0 - 46.6 %   MCV 95 79 - 97 fL   MCH 30.8 26.6 - 33.0 pg   MCHC 32.3 31.5 - 35.7 g/dL   RDW 13.8 12.3 - 15.4 %   Platelets 222 150 - 379 x10E3/uL   Neutrophils 42 %   Lymphs 31 %  Monocytes 12 %   Eos 13 %   Basos 2 %   Neutrophils Absolute 2.3 1.4 - 7.0 x10E3/uL   Lymphocytes Absolute 1.7 0.7 - 3.1 x10E3/uL   Monocytes Absolute 0.7 0.1 - 0.9 x10E3/uL   EOS (ABSOLUTE) 0.7 (H) 0.0 - 0.4 x10E3/uL   Basophils Absolute 0.1 0.0 - 0.2 x10E3/uL   Immature Granulocytes 0 %   Immature Grans (Abs) 0.0 0.0 - 0.1 x10E3/uL  CMP14+EGFR  Result Value Ref Range   Glucose 100 (H) 65 - 99 mg/dL   BUN 31 (H) 8 - 27 mg/dL   Creatinine, Ser 1.28 (H) 0.57 - 1.00 mg/dL   GFR calc non Af Amer 41 (L) >59 mL/min/1.73   GFR calc Af Amer 47 (L) >59 mL/min/1.73   BUN/Creatinine Ratio 24 11 - 26   Sodium 143 134 - 144 mmol/L   Potassium 5.1 3.5 - 5.2 mmol/L   Chloride 104 96 - 106 mmol/L   CO2 26 18 - 29 mmol/L   Calcium 9.7 8.7 - 10.3 mg/dL   Total Protein 6.3 6.0 - 8.5 g/dL   Albumin 3.6 3.5 - 4.8 g/dL   Globulin, Total 2.7 1.5 - 4.5 g/dL   Albumin/Globulin Ratio 1.3 1.1 - 2.5   Bilirubin Total <0.2 0.0 - 1.2 mg/dL    Alkaline Phosphatase 65 39 - 117 IU/L   AST 20 0 - 40 IU/L   ALT 20 0 - 32 IU/L  POCT urinalysis dipstick  Result Value Ref Range   Color, UA gold    Clarity, UA cloudy    Glucose, UA neg    Bilirubin, UA neg    Ketones, UA neg    Spec Grav, UA >=1.030    Blood, UA large    pH, UA 5.0    Protein, UA 4+    Urobilinogen, UA negative    Nitrite, UA pos    Leukocytes, UA moderate (2+) (A) Negative  POCT UA - Microscopic Only  Result Value Ref Range   WBC, Ur, HPF, POC tntc    RBC, urine, microscopic tntc    Bacteria, U Microscopic many    Mucus, UA neg    Epithelial cells, urine per micros few    Crystals, Ur, HPF, POC neg    Casts, Ur, LPF, POC neg    Yeast, UA neg      Assessment & Plan:   Tamara Stein was seen today for fatigue and altered mental status.  Diagnoses and all orders for this visit:  Weakness -     POCT urinalysis dipstick -     POCT UA - Microscopic Only -     Urine culture -     CBC with Differential/Platelet -     CMP14+EGFR  Urinary tract infection with hematuria, site unspecified -     CBC with Differential/Platelet -     CMP14+EGFR  Other orders -     ciprofloxacin (CIPRO) 250 MG tablet; Take 1 tablet (250 mg total) by mouth 2 (two) times daily.   I have discontinued Ms. Wheatley's ondansetron and nitrofurantoin (macrocrystal-monohydrate). I am also having her start on ciprofloxacin. Additionally, I am having her maintain her omeprazole, aspirin EC, multivitamin with minerals, Calcium Carbonate-Vitamin D (CALTRATE 600+D PO), ferrous sulfate, lisinopril-hydrochlorothiazide, diclofenac, levothyroxine, atorvastatin, raloxifene, meclizine, and DULoxetine.  Meds ordered this encounter  Medications  . ciprofloxacin (CIPRO) 250 MG tablet    Sig: Take 1 tablet (250 mg total) by mouth 2 (two) times daily.    Dispense:  14 tablet    Refill:  0     Follow-up: Return in about 1 week (around 09/05/2015) for fatigue Mental status schange.  Claretta Fraise,  M.D.

## 2015-08-30 LAB — CBC WITH DIFFERENTIAL/PLATELET
BASOS ABS: 0.1 10*3/uL (ref 0.0–0.2)
Basos: 2 %
EOS (ABSOLUTE): 0.7 10*3/uL — ABNORMAL HIGH (ref 0.0–0.4)
Eos: 13 %
Hematocrit: 31 % — ABNORMAL LOW (ref 34.0–46.6)
Hemoglobin: 10 g/dL — ABNORMAL LOW (ref 11.1–15.9)
Immature Grans (Abs): 0 10*3/uL (ref 0.0–0.1)
Immature Granulocytes: 0 %
LYMPHS ABS: 1.7 10*3/uL (ref 0.7–3.1)
Lymphs: 31 %
MCH: 30.8 pg (ref 26.6–33.0)
MCHC: 32.3 g/dL (ref 31.5–35.7)
MCV: 95 fL (ref 79–97)
MONOCYTES: 12 %
MONOS ABS: 0.7 10*3/uL (ref 0.1–0.9)
Neutrophils Absolute: 2.3 10*3/uL (ref 1.4–7.0)
Neutrophils: 42 %
Platelets: 222 10*3/uL (ref 150–379)
RBC: 3.25 x10E6/uL — AB (ref 3.77–5.28)
RDW: 13.8 % (ref 12.3–15.4)
WBC: 5.6 10*3/uL (ref 3.4–10.8)

## 2015-08-30 LAB — CMP14+EGFR
ALK PHOS: 65 IU/L (ref 39–117)
ALT: 20 IU/L (ref 0–32)
AST: 20 IU/L (ref 0–40)
Albumin/Globulin Ratio: 1.3 (ref 1.1–2.5)
Albumin: 3.6 g/dL (ref 3.5–4.8)
BUN/Creatinine Ratio: 24 (ref 11–26)
BUN: 31 mg/dL — AB (ref 8–27)
Bilirubin Total: <0.2 mg/dL (ref 0.0–1.2)
CO2: 26 mmol/L (ref 18–29)
CREATININE: 1.28 mg/dL — AB (ref 0.57–1.00)
Calcium: 9.7 mg/dL (ref 8.7–10.3)
Chloride: 104 mmol/L (ref 96–106)
GFR calc Af Amer: 47 mL/min/{1.73_m2} — ABNORMAL LOW (ref >59)
GFR calc non Af Amer: 41 mL/min/{1.73_m2} — ABNORMAL LOW (ref >59)
GLUCOSE: 100 mg/dL — AB (ref 65–99)
Globulin, Total: 2.7 g/dL (ref 1.5–4.5)
Potassium: 5.1 mmol/L (ref 3.5–5.2)
Sodium: 143 mmol/L (ref 134–144)
Total Protein: 6.3 g/dL (ref 6.0–8.5)

## 2015-08-31 DIAGNOSIS — H5212 Myopia, left eye: Secondary | ICD-10-CM | POA: Diagnosis not present

## 2015-08-31 DIAGNOSIS — Z961 Presence of intraocular lens: Secondary | ICD-10-CM | POA: Diagnosis not present

## 2015-08-31 DIAGNOSIS — H47619 Cortical blindness, unspecified side of brain: Secondary | ICD-10-CM | POA: Diagnosis not present

## 2015-08-31 DIAGNOSIS — H4720 Unspecified optic atrophy: Secondary | ICD-10-CM | POA: Diagnosis not present

## 2015-09-02 LAB — URINE CULTURE

## 2015-09-06 ENCOUNTER — Ambulatory Visit (INDEPENDENT_AMBULATORY_CARE_PROVIDER_SITE_OTHER): Payer: Medicare Other | Admitting: Family Medicine

## 2015-09-06 ENCOUNTER — Encounter: Payer: Self-pay | Admitting: Family Medicine

## 2015-09-06 VITALS — Temp 96.6°F

## 2015-09-06 DIAGNOSIS — N39498 Other specified urinary incontinence: Secondary | ICD-10-CM | POA: Diagnosis not present

## 2015-09-06 LAB — POCT URINALYSIS DIPSTICK
Bilirubin, UA: NEGATIVE
Blood, UA: NEGATIVE
GLUCOSE UA: NEGATIVE
Ketones, UA: NEGATIVE
LEUKOCYTES UA: NEGATIVE
NITRITE UA: NEGATIVE
PROTEIN UA: NEGATIVE
SPEC GRAV UA: 1.015
UROBILINOGEN UA: NEGATIVE
pH, UA: 5

## 2015-09-06 LAB — POCT UA - MICROSCOPIC ONLY
BACTERIA, U MICROSCOPIC: NEGATIVE
Casts, Ur, LPF, POC: NEGATIVE
Crystals, Ur, HPF, POC: NEGATIVE
Mucus, UA: NEGATIVE
RBC, URINE, MICROSCOPIC: NEGATIVE
Yeast, UA: NEGATIVE

## 2015-09-06 MED ORDER — LISINOPRIL-HYDROCHLOROTHIAZIDE 20-12.5 MG PO TABS: 1.0000 | ORAL_TABLET | Freq: Every day | ORAL | Status: DC

## 2015-09-06 MED ORDER — DICLOFENAC SODIUM 50 MG PO TBEC: 50.0000 mg | DELAYED_RELEASE_TABLET | Freq: Three times a day (TID) | ORAL | Status: DC

## 2015-09-06 MED ORDER — OMEPRAZOLE 40 MG PO CPDR: 40.0000 mg | DELAYED_RELEASE_CAPSULE | Freq: Every day | ORAL | Status: DC

## 2015-09-06 MED ORDER — LEVOTHYROXINE SODIUM 50 MCG PO TABS: 50.0000 ug | ORAL_TABLET | Freq: Every day | ORAL | Status: DC

## 2015-09-06 NOTE — Progress Notes (Signed)
Subjective:  Patient ID: Tamara Stein, female    DOB: [birth year]  Age: 75 y.o. MRN: [id number]  CC: Urinary Tract Infection and Extremity Weakness   HPI Spruha Weight presents for Daughter states pt isn't herself. She is confuseed at times. Staes she is listless. Subtle, but different due to being wheelchair bound. Appetite is good. No fever. No nausea or flank pain. Concerned that urine might be infected.   History Lesleigh has a past medical history of GERD (gastroesophageal reflux disease); Hypertension; Hyperlipidemia; Cerebellar degeneration; Chronic knee pain; Allergy; Anxiety; Cataract; Depression; Thyroid disease; and Chronic kidney disease.   She has past surgical history that includes Cholecystectomy; Abdominal hysterectomy; and RIGHT ELBOW.   Her family history includes Arthritis in her sister; Cancer in her brother; Diabetes in her brother; Heart disease in her brother; Hyperlipidemia in her sister; Hypertension in her sister.She reports that she has never smoked. She does not have any smokeless tobacco history on file. She reports that she does not drink alcohol or use illicit drugs.  Outpatient Prescriptions Prior to Visit  Medication Sig Dispense Refill  . aspirin EC 81 MG tablet Take 81 mg by mouth every morning.     Marland Kitchen atorvastatin (LIPITOR) 40 MG tablet Take 1 tablet (40 mg total) by mouth at bedtime. 90 tablet 1  . Calcium Carbonate-Vitamin D (CALTRATE 600+D PO) Take 1 tablet by mouth daily.    . DULoxetine (CYMBALTA) 60 MG capsule Take 1 capsule (60 mg total) by mouth daily. 90 capsule 0  . ferrous sulfate 325 (65 FE) MG tablet Take 325 mg by mouth daily with breakfast.    . meclizine (ANTIVERT) 25 MG tablet Take 1 tablet (25 mg total) by mouth 3 (three) times daily as needed for dizziness. 270 tablet 0  . Multiple Vitamin (MULTIVITAMIN WITH MINERALS) TABS tablet Take 1 tablet by mouth daily.    . raloxifene (EVISTA) 60 MG tablet Take 1 tablet (60 mg total) by  mouth daily. 90 tablet 1  . diclofenac (VOLTAREN) 50 MG EC tablet Take 50 mg by mouth 3 (three) times daily.    Marland Kitchen levothyroxine (SYNTHROID, LEVOTHROID) 50 MCG tablet Take 1 tablet (50 mcg total) by mouth daily before breakfast. 90 tablet 3  . lisinopril-hydrochlorothiazide (PRINZIDE,ZESTORETIC) 20-12.5 MG per tablet Take 1 tablet by mouth daily.    Marland Kitchen omeprazole (PRILOSEC) 40 MG capsule Take 40 mg by mouth daily.    . ciprofloxacin (CIPRO) 250 MG tablet Take 1 tablet (250 mg total) by mouth 2 (two) times daily. 14 tablet 0   No facility-administered medications prior to visit.    ROS Review of Systems  Constitutional: Negative for fever, chills and diaphoresis.  HENT: Negative for congestion.   Eyes: Negative for visual disturbance.  Respiratory: Negative for cough and shortness of breath.   Cardiovascular: Negative for chest pain and palpitations.  Gastrointestinal: Negative for nausea, diarrhea and constipation.  Genitourinary: Positive for dysuria, urgency and frequency. Negative for hematuria, flank pain, decreased urine volume, menstrual problem and pelvic pain.  Musculoskeletal: Negative for joint swelling and arthralgias.  Skin: Negative for rash.  Neurological: Negative for dizziness and numbness.    Objective:  Temp(Src) 96.6 F (35.9 C) (Oral)  Ht   Wt   BP Readings from Last 3 Encounters:  08/29/15 145/81  06/27/15 139/76  06/19/15 155/84    Wt Readings from Last 3 Encounters:  01/21/15 215 lb (97.523 kg)  03/27/14 210 lb 8.6 oz (95.5 kg)     Physical Exam  Constitutional: She is oriented to person, place, and time. She appears well-developed and well-nourished. No distress.  HENT:  Head: Normocephalic and atraumatic.  Right Ear: External ear normal.  Left Ear: External ear normal.  Nose: Nose normal.  Mouth/Throat: Oropharynx is clear and moist.  Eyes: Conjunctivae and EOM are normal. Pupils are equal, round, and reactive to light.  Neck: Normal range of  motion. Neck supple. No thyromegaly present.  Cardiovascular: Normal rate, regular rhythm and normal heart sounds.   No murmur heard. Pulmonary/Chest: Effort normal and breath sounds normal. No respiratory distress. She has no wheezes. She has no rales.  Abdominal: Soft. Bowel sounds are normal. She exhibits no distension and no mass. There is no tenderness. There is no rebound and no guarding.  Musculoskeletal: She exhibits no tenderness.  Lymphadenopathy:    She has no cervical adenopathy.  Neurological: She is alert and oriented to person, place, and time. She has normal reflexes. She exhibits abnormal muscle tone. Coordination abnormal.  Skin: Skin is warm and dry.  Psychiatric: She has a normal mood and affect. Her behavior is normal. Judgment and thought content normal.     Lab Results  Component Value Date   WBC 5.6 08/29/2015   HGB 10.9* 01/21/2015   HCT 31.0* 08/29/2015   PLT 222 08/29/2015   GLUCOSE 100* 08/29/2015   CHOL 138 06/19/2015   TRIG 106 06/19/2015   HDL 44 06/19/2015   LDLCALC 73 06/19/2015   ALT 20 08/29/2015   AST 20 08/29/2015   NA 143 08/29/2015   K 5.1 08/29/2015   CL 104 08/29/2015   CREATININE 1.28* 08/29/2015   BUN 31* 08/29/2015   CO2 26 08/29/2015   TSH 3.420 06/19/2015   HGBA1C 5.9* 03/25/2014   Results for orders placed or performed in visit on 09/06/15  POCT UA - Microscopic Only  Result Value Ref Range   WBC, Ur, HPF, POC occ    RBC, urine, microscopic neg    Bacteria, U Microscopic neg    Mucus, UA neg    Epithelial cells, urine per micros occ    Crystals, Ur, HPF, POC neg    Casts, Ur, LPF, POC neg    Yeast, UA neg   POCT urinalysis dipstick  Result Value Ref Range   Color, UA yellow    Clarity, UA clear    Glucose, UA neg    Bilirubin, UA neg    Ketones, UA neg    Spec Grav, UA 1.015    Blood, UA neg    pH, UA 5.0    Protein, UA neg    Urobilinogen, UA negative    Nitrite, UA neg    Leukocytes, UA Negative Negative     Dg Chest 2 View  01/21/2015  CLINICAL DATA:  75 year old female with shortness breath and productive cough for the past 2-3 days. EXAM: CHEST  2 VIEW COMPARISON:  Chest x-ray a 03/25/2014. FINDINGS: Lung volumes are normal. No consolidative airspace disease. No pleural effusions. Mild diffuse peribronchial cuffing and interstitial prominence. Heart size is mildly enlarged. Upper mediastinal contours are within normal limits allowing for patient rotation to the left. Large hiatal hernia. Atherosclerotic calcifications in the thoracic aorta. IMPRESSION: 1. Mild diffuse peribronchial cuffing and interstitial prominence may reflect a mild bronchitis. 2. Mild cardiomegaly. 3. Atherosclerosis. 4. Large hiatal hernia. Electronically Signed   By: Vinnie Langton M.D.   On: 01/21/2015 10:36  Results for orders placed or performed in visit on 09/06/15  POCT UA -  Microscopic Only  Result Value Ref Range   WBC, Ur, HPF, POC occ    RBC, urine, microscopic neg    Bacteria, U Microscopic neg    Mucus, UA neg    Epithelial cells, urine per micros occ    Crystals, Ur, HPF, POC neg    Casts, Ur, LPF, POC neg    Yeast, UA neg   POCT urinalysis dipstick  Result Value Ref Range   Color, UA yellow    Clarity, UA clear    Glucose, UA neg    Bilirubin, UA neg    Ketones, UA neg    Spec Grav, UA 1.015    Blood, UA neg    pH, UA 5.0    Protein, UA neg    Urobilinogen, UA negative    Nitrite, UA neg    Leukocytes, UA Negative Negative     Assessment & Plan:   Gladine was seen today for urinary tract infection and extremity weakness.  Diagnoses and all orders for this visit:  Other urinary incontinence -     POCT UA - Microscopic Only -     POCT urinalysis dipstick  Other orders -     lisinopril-hydrochlorothiazide (PRINZIDE,ZESTORETIC) 20-12.5 MG tablet; Take 1 tablet by mouth daily. -     diclofenac (VOLTAREN) 50 MG EC tablet; Take 1 tablet (50 mg total) by mouth 3 (three) times daily. -      omeprazole (PRILOSEC) 40 MG capsule; Take 1 capsule (40 mg total) by mouth daily. -     levothyroxine (SYNTHROID, LEVOTHROID) 50 MCG tablet; Take 1 tablet (50 mcg total) by mouth daily before breakfast.   I have discontinued Ms. Skidmore's ciprofloxacin. I have also changed her lisinopril-hydrochlorothiazide, diclofenac, and omeprazole. Additionally, I am having her maintain her aspirin EC, multivitamin with minerals, Calcium Carbonate-Vitamin D (CALTRATE 600+D PO), ferrous sulfate, atorvastatin, raloxifene, meclizine, DULoxetine, and levothyroxine.  Meds ordered this encounter  Medications  . lisinopril-hydrochlorothiazide (PRINZIDE,ZESTORETIC) 20-12.5 MG tablet    Sig: Take 1 tablet by mouth daily.    Dispense:  90 tablet    Refill:  1  . diclofenac (VOLTAREN) 50 MG EC tablet    Sig: Take 1 tablet (50 mg total) by mouth 3 (three) times daily.    Dispense:  270 tablet    Refill:  1  . omeprazole (PRILOSEC) 40 MG capsule    Sig: Take 1 capsule (40 mg total) by mouth daily.    Dispense:  90 capsule    Refill:  1  . levothyroxine (SYNTHROID, LEVOTHROID) 50 MCG tablet    Sig: Take 1 tablet (50 mcg total) by mouth daily before breakfast.    Dispense:  90 tablet    Refill:  1     Follow-up: No Follow-up on file.  Claretta Fraise, M.D.

## 2015-09-19 ENCOUNTER — Telehealth: Payer: Self-pay | Admitting: Family Medicine

## 2015-09-20 ENCOUNTER — Telehealth: Payer: Self-pay | Admitting: Family Medicine

## 2015-09-20 NOTE — Telephone Encounter (Signed)
Do not have forms, pt aware. She will ask them to resend

## 2015-09-21 ENCOUNTER — Telehealth: Payer: Self-pay | Admitting: Family Medicine

## 2015-09-24 ENCOUNTER — Ambulatory Visit (INDEPENDENT_AMBULATORY_CARE_PROVIDER_SITE_OTHER): Payer: Medicare Other

## 2015-09-24 ENCOUNTER — Encounter: Payer: Self-pay | Admitting: Family Medicine

## 2015-09-24 ENCOUNTER — Ambulatory Visit (INDEPENDENT_AMBULATORY_CARE_PROVIDER_SITE_OTHER): Payer: Medicare Other | Admitting: Family Medicine

## 2015-09-24 VITALS — BP 141/86 | HR 93 | Temp 97.0°F

## 2015-09-24 DIAGNOSIS — I10 Essential (primary) hypertension: Secondary | ICD-10-CM

## 2015-09-24 DIAGNOSIS — M79641 Pain in right hand: Secondary | ICD-10-CM | POA: Diagnosis not present

## 2015-09-24 DIAGNOSIS — M79643 Pain in unspecified hand: Secondary | ICD-10-CM | POA: Diagnosis not present

## 2015-09-24 DIAGNOSIS — M25561 Pain in right knee: Secondary | ICD-10-CM

## 2015-09-24 DIAGNOSIS — M1710 Unilateral primary osteoarthritis, unspecified knee: Secondary | ICD-10-CM

## 2015-09-24 DIAGNOSIS — M25562 Pain in left knee: Secondary | ICD-10-CM | POA: Diagnosis not present

## 2015-09-24 DIAGNOSIS — G319 Degenerative disease of nervous system, unspecified: Secondary | ICD-10-CM | POA: Diagnosis not present

## 2015-09-24 DIAGNOSIS — M129 Arthropathy, unspecified: Secondary | ICD-10-CM

## 2015-09-24 DIAGNOSIS — M1990 Unspecified osteoarthritis, unspecified site: Secondary | ICD-10-CM | POA: Insufficient documentation

## 2015-09-24 NOTE — Telephone Encounter (Signed)
Obtained notes on visit today. Faxed pt aware

## 2015-09-24 NOTE — Progress Notes (Signed)
Subjective:  Patient ID: Tamara Stein, female    DOB: Jun 02, 1941  Age: 75 y.o. MRN: [id number]  CC: Knee Pain   HPI Tamara Stein presents for power wheelchair malfunctioned. Pt. Uses for all ambulation. Can not walk due to lack of control and weakness of the lower extremities. This is based on her diagnosis of cerebellar degeneration. Controller is shorting out. Vendor needs my statement that she relies on the chair in order to complete the repair.   Nearly fell off bed last week. Caught herself with right hand. Now has right hand pain. Also hit left hand on a counter last night and has pain in the left hand as well.  History Tamara Stein has a past medical history of GERD (gastroesophageal reflux disease); Hypertension; Hyperlipidemia; Cerebellar degeneration; Chronic knee pain; Allergy; Anxiety; Cataract; Depression; Thyroid disease; and Chronic kidney disease.   She has past surgical history that includes Cholecystectomy; Abdominal hysterectomy; and RIGHT ELBOW.   Her family history includes Arthritis in her sister; Cancer in her brother; Diabetes in her brother; Heart disease in her brother; Hyperlipidemia in her sister; Hypertension in her sister.She reports that she has never smoked. She does not have any smokeless tobacco history on file. She reports that she does not drink alcohol or use illicit drugs.    ROS Review of Systems  Constitutional: Negative for fever, activity change and appetite change.  HENT: Negative for congestion, rhinorrhea and sore throat.   Eyes: Negative for visual disturbance.  Respiratory: Negative for cough and shortness of breath.   Cardiovascular: Negative for chest pain and palpitations.  Gastrointestinal: Negative for nausea, abdominal pain and diarrhea.  Musculoskeletal: Positive for myalgias and arthralgias.  Neurological: Positive for weakness.    Objective:  BP 141/86 mmHg  Pulse 93  Temp(Src) 97 F (36.1 C) (Oral)  Wt   SpO2  96%  BP Readings from Last 3 Encounters:  09/24/15 141/86  08/29/15 145/81  06/27/15 139/76    Wt Readings from Last 3 Encounters:  01/21/15 215 lb (97.523 kg)  03/27/14 210 lb 8.6 oz (95.5 kg)     Physical Exam  Constitutional: She appears well-developed and well-nourished.  HENT:  Head: Normocephalic.  Neck: Normal range of motion. Neck supple.  Cardiovascular: Normal rate and regular rhythm.   No murmur heard. Pulmonary/Chest: Effort normal and breath sounds normal.  Musculoskeletal: She exhibits tenderness (both knees).  Wheelchair bound for weak lower ext. tener for ROM for BUE at the MCP 2-4 bilat.      A steroid injection was performed at the medial joint space of the left knee and lateral of the right.Sterile cleaning and technique using 3.5 cc marcan and 9 mg of Celestone for each knee. This was well tolerated.   Dg Chest 2 View  01/21/2015  CLINICAL DATA:  75 year old female with shortness breath and productive cough for the past 2-3 days. EXAM: CHEST  2 VIEW COMPARISON:  Chest x-ray a 03/25/2014. FINDINGS: Lung volumes are normal. No consolidative airspace disease. No pleural effusions. Mild diffuse peribronchial cuffing and interstitial prominence. Heart size is mildly enlarged. Upper mediastinal contours are within normal limits allowing for patient rotation to the left. Large hiatal hernia. Atherosclerotic calcifications in the thoracic aorta. IMPRESSION: 1. Mild diffuse peribronchial cuffing and interstitial prominence may reflect a mild bronchitis. 2. Mild cardiomegaly. 3. Atherosclerosis. 4. Large hiatal hernia. Electronically Signed   By: Vinnie Langton M.D.   On: 01/21/2015 10:36    Assessment & Plan:   Tamara Stein was seen  today for knee pain.  Diagnoses and all orders for this visit:  Pain of hand, unspecified laterality -     DG Hand Complete Left; Future -     DG Hand Complete Right; Future  Cerebellar degeneration  Essential  hypertension  Arthritis of knee    I am having Tamara Stein maintain her aspirin EC, multivitamin with minerals, Calcium Carbonate-Vitamin D (CALTRATE 600+D PO), ferrous sulfate, atorvastatin, raloxifene, meclizine, DULoxetine, lisinopril-hydrochlorothiazide, diclofenac, omeprazole, and levothyroxine.  No orders of the defined types were placed in this encounter.     Follow-up: No Follow-up on file.  Claretta Fraise, M.D.

## 2015-10-15 ENCOUNTER — Other Ambulatory Visit: Payer: Self-pay | Admitting: Family Medicine

## 2015-11-07 ENCOUNTER — Telehealth: Payer: Self-pay | Admitting: Family Medicine

## 2015-11-07 NOTE — Telephone Encounter (Signed)
Spoke with pt regarding constipation She will try OTCs Will call back to schedule appt if no improvement

## 2015-11-20 ENCOUNTER — Other Ambulatory Visit: Payer: Medicare Other

## 2015-11-20 DIAGNOSIS — R350 Frequency of micturition: Secondary | ICD-10-CM

## 2015-11-20 LAB — URINALYSIS, COMPLETE
Bilirubin, UA: NEGATIVE
Glucose, UA: NEGATIVE
Ketones, UA: NEGATIVE
Nitrite, UA: NEGATIVE
PH UA: 5.5 (ref 5.0–7.5)
PROTEIN UA: NEGATIVE
RBC, UA: NEGATIVE
Specific Gravity, UA: 1.02 (ref 1.005–1.030)
Urobilinogen, Ur: 0.2 mg/dL (ref 0.2–1.0)

## 2015-11-20 LAB — MICROSCOPIC EXAMINATION

## 2015-11-28 DIAGNOSIS — H6123 Impacted cerumen, bilateral: Secondary | ICD-10-CM | POA: Diagnosis not present

## 2015-12-21 ENCOUNTER — Ambulatory Visit (INDEPENDENT_AMBULATORY_CARE_PROVIDER_SITE_OTHER): Payer: Medicare Other | Admitting: Family Medicine

## 2015-12-21 ENCOUNTER — Encounter: Payer: Self-pay | Admitting: Family Medicine

## 2015-12-21 DIAGNOSIS — R8290 Unspecified abnormal findings in urine: Secondary | ICD-10-CM | POA: Diagnosis not present

## 2015-12-21 DIAGNOSIS — E039 Hypothyroidism, unspecified: Secondary | ICD-10-CM | POA: Diagnosis not present

## 2015-12-21 DIAGNOSIS — N3090 Cystitis, unspecified without hematuria: Secondary | ICD-10-CM | POA: Diagnosis not present

## 2015-12-21 DIAGNOSIS — I10 Essential (primary) hypertension: Secondary | ICD-10-CM

## 2015-12-21 LAB — URINALYSIS, COMPLETE
BILIRUBIN UA: NEGATIVE
GLUCOSE, UA: NEGATIVE
KETONES UA: NEGATIVE
Nitrite, UA: NEGATIVE
PROTEIN UA: NEGATIVE
RBC UA: NEGATIVE
SPEC GRAV UA: 1.02 (ref 1.005–1.030)
Urobilinogen, Ur: 0.2 mg/dL (ref 0.2–1.0)
pH, UA: 5.5 (ref 5.0–7.5)

## 2015-12-21 LAB — MICROSCOPIC EXAMINATION: RBC, UA: NONE SEEN /hpf (ref 0–?)

## 2015-12-21 MED ORDER — NITROFURANTOIN MONOHYD MACRO 100 MG PO CAPS: 100.0000 mg | ORAL_CAPSULE | Freq: Two times a day (BID) | ORAL | Status: DC

## 2015-12-21 NOTE — Progress Notes (Signed)
Subjective:  Patient ID: Tamara Stein, female    DOB: 1940-09-02  Age: 75 y.o. MRN: [id number]  CC: Urinary Tract Infection   HPI Harvest Deist presents for burning with urination and frequency for several days. Denies fever . No flank pain. No nausea, vomiting. Just tired a lot. No get up and go.  Patient presents for follow-up on  thyroid. The patient has a history of hypothyroidism for many years. It has been stable recently. Pt. denies any change in  voice, loss of hair, heat or cold intolerance. Energy level has been poor. Patient denies constipation and diarrhea. No myxedema. Medication is reviewed, per med list. Verified that pt is taking it daily on an empty stomach. Well tolerated.   follow-up of hypertension. Patient has no history of chest pain or shortness of breath or recent cough. Patient also denies symptoms of TIA such as numbness or lateralizing weakness. Patient checks  blood pressure at home and has had high and low readings recently. Patient denies side effects from medication. States taking it regularly.    History Bailey has a past medical history of GERD (gastroesophageal reflux disease); Hypertension; Hyperlipidemia; Cerebellar degeneration; Chronic knee pain; Allergy; Anxiety; Cataract; Depression; Thyroid disease; and Chronic kidney disease.   She has past surgical history that includes Cholecystectomy; Abdominal hysterectomy; and RIGHT ELBOW.   Her family history includes Arthritis in her sister; Cancer in her brother; Diabetes in her brother; Heart disease in her brother; Hyperlipidemia in her sister; Hypertension in her sister.She reports that she has never smoked. She does not have any smokeless tobacco history on file. She reports that she does not drink alcohol or use illicit drugs.    ROS Review of Systems  Constitutional: Negative for fever, chills and diaphoresis.  HENT: Negative for congestion.   Eyes: Negative for visual disturbance.    Respiratory: Negative for cough and shortness of breath.   Cardiovascular: Negative for chest pain and palpitations.  Gastrointestinal: Negative for nausea, diarrhea and constipation.  Genitourinary: Positive for dysuria, urgency and frequency. Negative for hematuria, flank pain, decreased urine volume, menstrual problem and pelvic pain.  Musculoskeletal: Negative for joint swelling and arthralgias.  Skin: Negative for rash.  Neurological: Negative for dizziness and numbness.    Objective:  BP 175/85 mmHg  Pulse 89  Temp(Src) 96.8 F (36 C) (Oral)  Wt   SpO2 100%  BP Readings from Last 3 Encounters:  12/21/15 175/85  09/24/15 141/86  08/29/15 145/81    Wt Readings from Last 3 Encounters:  01/21/15 215 lb (97.523 kg)  03/27/14 210 lb 8.6 oz (95.5 kg)     Physical Exam  Constitutional: She is oriented to person, place, and time. She appears well-developed and well-nourished.  HENT:  Head: Normocephalic and atraumatic.  Cardiovascular: Normal rate and regular rhythm.   No murmur heard. Pulmonary/Chest: Effort normal and breath sounds normal.  Abdominal: Soft. Bowel sounds are normal. She exhibits no mass. There is no tenderness. There is no rebound and no guarding.  Musculoskeletal: She exhibits no tenderness.  Neurological: She is alert and oriented to person, place, and time.  Skin: Skin is warm and dry.  Psychiatric: She has a normal mood and affect. Her behavior is normal.     Lab Results  Component Value Date   WBC 5.6 08/29/2015   HGB 10.9* 01/21/2015   HCT 31.0* 08/29/2015   PLT 222 08/29/2015   GLUCOSE 100* 08/29/2015   CHOL 138 06/19/2015   TRIG 106 06/19/2015  HDL 44 06/19/2015   LDLCALC 73 06/19/2015   ALT 20 08/29/2015   AST 20 08/29/2015   NA 143 08/29/2015   K 5.1 08/29/2015   CL 104 08/29/2015   CREATININE 1.28* 08/29/2015   BUN 31* 08/29/2015   CO2 26 08/29/2015   TSH 3.420 06/19/2015   HGBA1C 5.9* 03/25/2014    Dg Chest 2  View  01/21/2015  CLINICAL DATA:  75 year old female with shortness breath and productive cough for the past 2-3 days. EXAM: CHEST  2 VIEW COMPARISON:  Chest x-ray a 03/25/2014. FINDINGS: Lung volumes are normal. No consolidative airspace disease. No pleural effusions. Mild diffuse peribronchial cuffing and interstitial prominence. Heart size is mildly enlarged. Upper mediastinal contours are within normal limits allowing for patient rotation to the left. Large hiatal hernia. Atherosclerotic calcifications in the thoracic aorta. IMPRESSION: 1. Mild diffuse peribronchial cuffing and interstitial prominence may reflect a mild bronchitis. 2. Mild cardiomegaly. 3. Atherosclerosis. 4. Large hiatal hernia. Electronically Signed   By: Vinnie Langton M.D.   On: 01/21/2015 10:36    Assessment & Plan:   Saidy was seen today for urinary tract infection.  Diagnoses and all orders for this visit:  Essential hypertension -     CBC with Differential/Platelet -     CMP14+EGFR  Hypothyroidism, unspecified hypothyroidism type -     CBC with Differential/Platelet -     CMP14+EGFR -     TSH + free T4  Cystitis -     Urinalysis, Complete -     CBC with Differential/Platelet -     CMP14+EGFR  Other orders -     nitrofurantoin, macrocrystal-monohydrate, (MACROBID) 100 MG capsule; Take 1 capsule (100 mg total) by mouth 2 (two) times daily.      I am having Ms. Lefkowitz start on nitrofurantoin (macrocrystal-monohydrate). I am also having her maintain her aspirin EC, multivitamin with minerals, Calcium Carbonate-Vitamin D (CALTRATE 600+D PO), ferrous sulfate, atorvastatin, raloxifene, meclizine, lisinopril-hydrochlorothiazide, diclofenac, omeprazole, levothyroxine, and DULoxetine.  Meds ordered this encounter  Medications  . nitrofurantoin, macrocrystal-monohydrate, (MACROBID) 100 MG capsule    Sig: Take 1 capsule (100 mg total) by mouth 2 (two) times daily.    Dispense:  14 capsule    Refill:  0      Follow-up: Return in about 3 months (around 03/22/2016), or if symptoms worsen or fail to improve.  Claretta Fraise, M.D.

## 2015-12-22 LAB — CBC WITH DIFFERENTIAL/PLATELET
BASOS: 2 %
Basophils Absolute: 0.1 10*3/uL (ref 0.0–0.2)
EOS (ABSOLUTE): 0.7 10*3/uL — ABNORMAL HIGH (ref 0.0–0.4)
EOS: 12 %
HEMATOCRIT: 30.6 % — AB (ref 34.0–46.6)
HEMOGLOBIN: 9.7 g/dL — AB (ref 11.1–15.9)
IMMATURE GRANS (ABS): 0 10*3/uL (ref 0.0–0.1)
IMMATURE GRANULOCYTES: 0 %
LYMPHS: 30 %
Lymphocytes Absolute: 1.8 10*3/uL (ref 0.7–3.1)
MCH: 29.4 pg (ref 26.6–33.0)
MCHC: 31.7 g/dL (ref 31.5–35.7)
MCV: 93 fL (ref 79–97)
MONOCYTES: 9 %
Monocytes Absolute: 0.6 10*3/uL (ref 0.1–0.9)
NEUTROS ABS: 2.7 10*3/uL (ref 1.4–7.0)
NEUTROS PCT: 47 %
PLATELETS: 220 10*3/uL (ref 150–379)
RBC: 3.3 x10E6/uL — ABNORMAL LOW (ref 3.77–5.28)
RDW: 14.2 % (ref 12.3–15.4)
WBC: 5.9 10*3/uL (ref 3.4–10.8)

## 2015-12-22 LAB — CMP14+EGFR
A/G RATIO: 1.6 (ref 1.2–2.2)
ALK PHOS: 74 IU/L (ref 39–117)
ALT: 16 IU/L (ref 0–32)
AST: 20 IU/L (ref 0–40)
Albumin: 3.9 g/dL (ref 3.5–4.8)
BUN/Creatinine Ratio: 21 (ref 12–28)
BUN: 31 mg/dL — ABNORMAL HIGH (ref 8–27)
Bilirubin Total: <0.2 mg/dL (ref 0.0–1.2)
CO2: 24 mmol/L (ref 18–29)
Calcium: 9.4 mg/dL (ref 8.7–10.3)
Chloride: 102 mmol/L (ref 96–106)
Creatinine, Ser: 1.46 mg/dL — ABNORMAL HIGH (ref 0.57–1.00)
GFR calc Af Amer: 40 mL/min/{1.73_m2} — ABNORMAL LOW (ref >59)
GFR calc non Af Amer: 35 mL/min/{1.73_m2} — ABNORMAL LOW (ref >59)
GLOBULIN, TOTAL: 2.4 g/dL (ref 1.5–4.5)
Glucose: 173 mg/dL — ABNORMAL HIGH (ref 65–99)
POTASSIUM: 4.3 mmol/L (ref 3.5–5.2)
SODIUM: 142 mmol/L (ref 134–144)
Total Protein: 6.3 g/dL (ref 6.0–8.5)

## 2015-12-22 LAB — TSH+FREE T4
FREE T4: 1.12 ng/dL (ref 0.82–1.77)
TSH: 3.2 u[IU]/mL (ref 0.450–4.500)

## 2015-12-27 ENCOUNTER — Encounter: Payer: Self-pay | Admitting: Family Medicine

## 2015-12-27 ENCOUNTER — Ambulatory Visit (INDEPENDENT_AMBULATORY_CARE_PROVIDER_SITE_OTHER): Payer: Medicare Other | Admitting: Family Medicine

## 2015-12-27 DIAGNOSIS — M25562 Pain in left knee: Secondary | ICD-10-CM | POA: Diagnosis not present

## 2015-12-27 DIAGNOSIS — M129 Arthropathy, unspecified: Secondary | ICD-10-CM | POA: Diagnosis not present

## 2015-12-27 DIAGNOSIS — M1710 Unilateral primary osteoarthritis, unspecified knee: Secondary | ICD-10-CM

## 2015-12-27 LAB — SPECIMEN STATUS REPORT

## 2015-12-27 LAB — ERYTHROPOIETIN: ERYTHROPOIETIN: 14.6 m[IU]/mL (ref 2.6–18.5)

## 2015-12-27 LAB — RETICULOCYTES

## 2015-12-27 NOTE — Progress Notes (Signed)
A steroid injection was performed at left knee,  lateral joint space left knee using 1% plain Lidocaine and  9 mg of Celestone. This was well tolerated.  A steroid injection was performed at right knee, lateral joint space using 1% plain Lidocaine and 9 mg of Celestone. This was well tolerated. 1. Arthritis of knee    Injected as above  Wound care reviewed. F/U 3 mos & prn.  Claretta Fraise, MD

## 2016-01-01 ENCOUNTER — Encounter: Payer: Self-pay | Admitting: *Deleted

## 2016-01-03 ENCOUNTER — Other Ambulatory Visit: Payer: Self-pay | Admitting: Family Medicine

## 2016-01-14 ENCOUNTER — Other Ambulatory Visit: Payer: Self-pay | Admitting: Family Medicine

## 2016-01-14 ENCOUNTER — Telehealth: Payer: Self-pay | Admitting: Family Medicine

## 2016-01-14 ENCOUNTER — Other Ambulatory Visit: Payer: Medicare Other

## 2016-01-14 DIAGNOSIS — R399 Unspecified symptoms and signs involving the genitourinary system: Secondary | ICD-10-CM

## 2016-01-14 DIAGNOSIS — N3090 Cystitis, unspecified without hematuria: Secondary | ICD-10-CM | POA: Diagnosis not present

## 2016-01-14 LAB — URINALYSIS, COMPLETE
Bilirubin, UA: NEGATIVE
GLUCOSE, UA: NEGATIVE
Leukocytes, UA: NEGATIVE
NITRITE UA: POSITIVE — AB
PROTEIN UA: NEGATIVE
RBC, UA: NEGATIVE
SPEC GRAV UA: 1.025 (ref 1.005–1.030)
UUROB: 0.2 mg/dL (ref 0.2–1.0)
pH, UA: 5 (ref 5.0–7.5)

## 2016-01-14 LAB — MICROSCOPIC EXAMINATION: RBC MICROSCOPIC, UA: NONE SEEN /HPF (ref 0–?)

## 2016-01-14 MED ORDER — LEVOFLOXACIN 500 MG PO TABS: 500.0000 mg | ORAL_TABLET | Freq: Every day | ORAL | Status: DC

## 2016-01-14 NOTE — Addendum Note (Signed)
Addended by: Wardell Heath on: 01/14/2016 11:31 AM   Modules accepted: Orders

## 2016-01-14 NOTE — Telephone Encounter (Signed)
Patient aware.

## 2016-01-14 NOTE — Telephone Encounter (Signed)
Returned patient's phone call.  Patient sates that she is having low back pain and difficulty urinating. Patient would like to for her aid to bring in a urine sample for her because her Lucianne Lei is broken and she can not get in a car.

## 2016-01-14 NOTE — Telephone Encounter (Signed)
yes

## 2016-01-15 ENCOUNTER — Telehealth: Payer: Self-pay | Admitting: *Deleted

## 2016-01-15 NOTE — Telephone Encounter (Signed)
Left message for pt that antibiotic was sent to [facility]

## 2016-01-15 NOTE — Telephone Encounter (Signed)
-----   Message from Claretta Fraise, MD sent at 01/14/2016  8:16 PM EDT ----- Tamara Stein, An antibiotic has been sent to [facility] for your bladder infection  Best Regards,.  [employer]

## 2016-01-16 LAB — URINE CULTURE

## 2016-02-20 DIAGNOSIS — G44219 Episodic tension-type headache, not intractable: Secondary | ICD-10-CM | POA: Diagnosis not present

## 2016-02-29 ENCOUNTER — Ambulatory Visit (INDEPENDENT_AMBULATORY_CARE_PROVIDER_SITE_OTHER): Payer: Medicare Other

## 2016-02-29 ENCOUNTER — Ambulatory Visit (INDEPENDENT_AMBULATORY_CARE_PROVIDER_SITE_OTHER): Payer: Medicare Other | Admitting: Family Medicine

## 2016-02-29 VITALS — BP 127/67 | HR 90 | Temp 97.4°F

## 2016-02-29 DIAGNOSIS — E039 Hypothyroidism, unspecified: Secondary | ICD-10-CM

## 2016-02-29 DIAGNOSIS — M25512 Pain in left shoulder: Secondary | ICD-10-CM

## 2016-02-29 DIAGNOSIS — M25511 Pain in right shoulder: Secondary | ICD-10-CM | POA: Diagnosis not present

## 2016-02-29 DIAGNOSIS — I10 Essential (primary) hypertension: Secondary | ICD-10-CM

## 2016-02-29 DIAGNOSIS — R42 Dizziness and giddiness: Secondary | ICD-10-CM | POA: Diagnosis not present

## 2016-02-29 MED ORDER — LISINOPRIL 20 MG PO TABS: 20.0000 mg | ORAL_TABLET | Freq: Every day | ORAL | Status: DC

## 2016-02-29 MED ORDER — PREDNISONE 10 MG PO TABS: ORAL_TABLET | ORAL | Status: DC

## 2016-02-29 NOTE — Progress Notes (Signed)
Subjective:  Patient ID: Tamara Stein, female    DOB: May 10, 1941  Age: 75 y.o. MRN: [id number]  CC: Fatigue (weakness); Hypotension (intermitent); and Shoulder Pain (bilateral)   HPI Tamara Stein presents for BP dropping in the afternoon Feels faint. Gets pale. Lays down for 30 min and sx improve, but still tired. Both shoulders hurting more  Patient presents for follow-up on  thyroid. The patient has a history of hypothyroidism for many years. It has been stable recently. Pt. denies any change in  voice, loss of hair, heat or cold intolerance. Energy level has been adequate to good. Patient denies constipation and diarrhea. No myxedema. Medication is as noted below. Verified that pt is taking it daily on an empty stomach. Well tolerated.   follow-up of hypertension. Patient has no history of headache chest pain or shortness of breath or recent cough. Patient also denies symptoms of TIA such as numbness weakness lateralizing.  History Tamara Stein has a past medical history of Allergy; Anxiety; Cataract; Cerebellar degeneration; Chronic kidney disease; Chronic knee pain; Depression; GERD (gastroesophageal reflux disease); Hyperlipidemia; Hypertension; and Thyroid disease.   She has a past surgical history that includes Cholecystectomy; Abdominal hysterectomy; and RIGHT ELBOW.   Her family history includes Arthritis in her sister; Cancer in her brother; Diabetes in her brother; Heart disease in her brother; Hyperlipidemia in her sister; Hypertension in her sister.She reports that she has never smoked. She does not have any smokeless tobacco history on file. She reports that she does not drink alcohol or use drugs.    ROS Review of Systems  Constitutional: Negative for activity change, appetite change and fever.  HENT: Negative for congestion, rhinorrhea and sore throat.   Eyes: Negative for visual disturbance.  Respiratory: Negative for cough and shortness of breath.     Cardiovascular: Negative for chest pain and palpitations.  Gastrointestinal: Negative for abdominal pain, diarrhea and nausea.  Genitourinary: Negative for dysuria.  Musculoskeletal: Positive for arthralgias and back pain. Negative for myalgias.  Neurological: Positive for dizziness, weakness (symmetric) and light-headedness.    Objective:  BP 127/67   Pulse 90   Temp 97.4 F (36.3 C) (Oral)   SpO2 99%   BP Readings from Last 3 Encounters:  02/29/16 127/67  12/21/15 (!) 175/85  09/24/15 (!) 141/86    Wt Readings from Last 3 Encounters:  01/21/15 215 lb (97.5 kg)  03/27/14 210 lb 8.6 oz (95.5 kg)     Physical Exam  Constitutional: She is oriented to person, place, and time. She appears well-developed and well-nourished. No distress.  HENT:  Head: Normocephalic and atraumatic.  Eyes: Conjunctivae are normal. Pupils are equal, round, and reactive to light.  Neck: Normal range of motion. Neck supple. No thyromegaly present.  Cardiovascular: Normal rate, regular rhythm and normal heart sounds.   No murmur heard. Pulmonary/Chest: Effort normal and breath sounds normal. No respiratory distress. She has no wheezes. She has no rales.  Abdominal: Soft. Bowel sounds are normal. She exhibits no distension. There is no tenderness.  Musculoskeletal: She exhibits tenderness (both shoulders).  Lymphadenopathy:    She has no cervical adenopathy.  Neurological: She is alert and oriented to person, place, and time.  Skin: Skin is warm and dry.  Psychiatric: She has a normal mood and affect. Her behavior is normal. Judgment and thought content normal.     Lab Results  Component Value Date   WBC 4.2 02/29/2016   HGB 10.9 (L) 01/21/2015   HCT 29.9 (L) 02/29/2016  PLT 209 02/29/2016   GLUCOSE 101 (H) 02/29/2016   CHOL 138 06/19/2015   TRIG 106 06/19/2015   HDL 44 06/19/2015   LDLCALC 73 06/19/2015   ALT 12 02/29/2016   AST 20 02/29/2016   NA 144 02/29/2016   K 5.2 02/29/2016    CL 108 (H) 02/29/2016   CREATININE 1.47 (H) 02/29/2016   BUN 40 (H) 02/29/2016   CO2 21 02/29/2016   TSH 2.360 02/29/2016   HGBA1C 5.9 (H) 03/25/2014    Dg Chest 2 View  01/21/2015  CLINICAL DATA:  75 year old female with shortness breath and productive cough for the past 2-3 days. EXAM: CHEST  2 VIEW COMPARISON:  Chest x-ray a 03/25/2014. FINDINGS: Lung volumes are normal. No consolidative airspace disease. No pleural effusions. Mild diffuse peribronchial cuffing and interstitial prominence. Heart size is mildly enlarged. Upper mediastinal contours are within normal limits allowing for patient rotation to the left. Large hiatal hernia. Atherosclerotic calcifications in the thoracic aorta. IMPRESSION: 1. Mild diffuse peribronchial cuffing and interstitial prominence may reflect a mild bronchitis. 2. Mild cardiomegaly. 3. Atherosclerosis. 4. Large hiatal hernia. Electronically Signed   By: Vinnie Langton M.D.   On: 01/21/2015 10:36    Assessment & Plan:   Tamara Stein was seen today for fatigue, hypotension and shoulder pain.  Diagnoses and all orders for this visit:  Essential hypertension  Pain of both shoulder joints -     DG Shoulder Left; Future -     DG Shoulder Right; Future  Feeling faint -     CBC with Differential/Platelet -     CMP14+EGFR -     Arthritis Panel -     TSH + free T4  Hypothyroidism, unspecified hypothyroidism type -     CBC with Differential/Platelet -     CMP14+EGFR -     Arthritis Panel -     TSH + free T4  Other orders -     lisinopril (PRINIVIL,ZESTRIL) 20 MG tablet; Take 1 tablet (20 mg total) by mouth daily. -     predniSONE (DELTASONE) 10 MG tablet; Take 5 daily for 2 days followed by 4,3,2 and 1 for 2 days each.   I have discontinued Ms. Blitch's nitrofurantoin (macrocrystal-monohydrate), lisinopril-hydrochlorothiazide, and levofloxacin. I am also having her start on lisinopril and predniSONE. Additionally, I am having her maintain her aspirin  EC, multivitamin with minerals, Calcium Carbonate-Vitamin D (CALTRATE 600+D PO), ferrous sulfate, meclizine, levothyroxine, raloxifene, atorvastatin, omeprazole, diclofenac, and DULoxetine.  Meds ordered this encounter  Medications  . lisinopril (PRINIVIL,ZESTRIL) 20 MG tablet    Sig: Take 1 tablet (20 mg total) by mouth daily.    Dispense:  90 tablet    Refill:  3  . predniSONE (DELTASONE) 10 MG tablet    Sig: Take 5 daily for 2 days followed by 4,3,2 and 1 for 2 days each.    Dispense:  30 tablet    Refill:  0     Follow-up: Return in about 3 months (around 05/31/2016), or if symptoms worsen or fail to improve.  Claretta Fraise, M.D.

## 2016-03-01 LAB — ARTHRITIS PANEL
BASOS: 2 %
Basophils Absolute: 0.1 10*3/uL (ref 0.0–0.2)
EOS (ABSOLUTE): 0.4 10*3/uL (ref 0.0–0.4)
Eos: 9 %
HEMATOCRIT: 29.9 % — AB (ref 34.0–46.6)
HEMOGLOBIN: 9.6 g/dL — AB (ref 11.1–15.9)
IMMATURE GRANS (ABS): 0 10*3/uL (ref 0.0–0.1)
Immature Granulocytes: 0 %
LYMPHS ABS: 1.1 10*3/uL (ref 0.7–3.1)
Lymphs: 26 %
MCH: 29.4 pg (ref 26.6–33.0)
MCHC: 32.1 g/dL (ref 31.5–35.7)
MCV: 91 fL (ref 79–97)
MONOCYTES: 16 %
Monocytes Absolute: 0.7 10*3/uL (ref 0.1–0.9)
NEUTROS ABS: 1.9 10*3/uL (ref 1.4–7.0)
Neutrophils: 47 %
Platelets: 209 10*3/uL (ref 150–379)
RBC: 3.27 x10E6/uL — AB (ref 3.77–5.28)
RDW: 14.2 % (ref 12.3–15.4)
Rhuematoid fact SerPl-aCnc: <10 IU/mL (ref 0.0–13.9)
SED RATE: 18 mm/h (ref 0–40)
URIC ACID: 5 mg/dL (ref 2.5–7.1)
WBC: 4.2 10*3/uL (ref 3.4–10.8)

## 2016-03-01 LAB — CMP14+EGFR
A/G RATIO: 1.6 (ref 1.2–2.2)
ALBUMIN: 3.6 g/dL (ref 3.5–4.8)
ALK PHOS: 70 IU/L (ref 39–117)
ALT: 12 IU/L (ref 0–32)
AST: 20 IU/L (ref 0–40)
BUN / CREAT RATIO: 27 (ref 12–28)
BUN: 40 mg/dL — ABNORMAL HIGH (ref 8–27)
Bilirubin Total: 0.3 mg/dL (ref 0.0–1.2)
CO2: 21 mmol/L (ref 18–29)
Calcium: 9.2 mg/dL (ref 8.7–10.3)
Chloride: 108 mmol/L — ABNORMAL HIGH (ref 96–106)
Creatinine, Ser: 1.47 mg/dL — ABNORMAL HIGH (ref 0.57–1.00)
GFR calc Af Amer: 40 mL/min/{1.73_m2} — ABNORMAL LOW (ref >59)
GFR, EST NON AFRICAN AMERICAN: 34 mL/min/{1.73_m2} — AB (ref >59)
GLOBULIN, TOTAL: 2.3 g/dL (ref 1.5–4.5)
Glucose: 101 mg/dL — ABNORMAL HIGH (ref 65–99)
POTASSIUM: 5.2 mmol/L (ref 3.5–5.2)
SODIUM: 144 mmol/L (ref 134–144)
Total Protein: 5.9 g/dL — ABNORMAL LOW (ref 6.0–8.5)

## 2016-03-01 LAB — TSH+FREE T4
Free T4: 1.17 ng/dL (ref 0.82–1.77)
TSH: 2.36 u[IU]/mL (ref 0.450–4.500)

## 2016-03-03 ENCOUNTER — Encounter: Payer: Self-pay | Admitting: Family Medicine

## 2016-03-03 ENCOUNTER — Telehealth: Payer: Self-pay | Admitting: *Deleted

## 2016-03-03 ENCOUNTER — Other Ambulatory Visit: Payer: Self-pay | Admitting: Family Medicine

## 2016-03-03 MED ORDER — TRAMADOL HCL 50 MG PO TABS: 50.0000 mg | ORAL_TABLET | Freq: Four times a day (QID) | ORAL | Status: DC | PRN

## 2016-03-03 NOTE — Telephone Encounter (Signed)
-----   Message from Claretta Fraise, MD sent at 03/03/2016 12:44 PM EDT ----- Please call in tramadol and let her know.Scrip in system.

## 2016-03-03 NOTE — Telephone Encounter (Signed)
Pt notified RX for Tramadol called into the pharmacy

## 2016-03-06 ENCOUNTER — Other Ambulatory Visit: Payer: Self-pay | Admitting: *Deleted

## 2016-03-06 ENCOUNTER — Other Ambulatory Visit: Payer: Self-pay | Admitting: Family Medicine

## 2016-03-07 DIAGNOSIS — G43909 Migraine, unspecified, not intractable, without status migrainosus: Secondary | ICD-10-CM | POA: Insufficient documentation

## 2016-03-07 DIAGNOSIS — R2689 Other abnormalities of gait and mobility: Secondary | ICD-10-CM | POA: Insufficient documentation

## 2016-03-07 DIAGNOSIS — R251 Tremor, unspecified: Secondary | ICD-10-CM | POA: Insufficient documentation

## 2016-03-10 DIAGNOSIS — G4700 Insomnia, unspecified: Secondary | ICD-10-CM | POA: Diagnosis not present

## 2016-03-10 DIAGNOSIS — R251 Tremor, unspecified: Secondary | ICD-10-CM | POA: Diagnosis not present

## 2016-03-10 DIAGNOSIS — R2689 Other abnormalities of gait and mobility: Secondary | ICD-10-CM | POA: Diagnosis not present

## 2016-03-10 DIAGNOSIS — G43009 Migraine without aura, not intractable, without status migrainosus: Secondary | ICD-10-CM | POA: Diagnosis not present

## 2016-03-10 DIAGNOSIS — G4730 Sleep apnea, unspecified: Secondary | ICD-10-CM | POA: Diagnosis not present

## 2016-03-10 DIAGNOSIS — H532 Diplopia: Secondary | ICD-10-CM | POA: Diagnosis not present

## 2016-03-10 DIAGNOSIS — R400 Somnolence: Secondary | ICD-10-CM | POA: Insufficient documentation

## 2016-03-10 DIAGNOSIS — G319 Degenerative disease of nervous system, unspecified: Secondary | ICD-10-CM | POA: Diagnosis not present

## 2016-03-11 ENCOUNTER — Telehealth: Payer: Self-pay | Admitting: Family Medicine

## 2016-03-11 ENCOUNTER — Other Ambulatory Visit: Payer: Self-pay | Admitting: Family Medicine

## 2016-03-11 MED ORDER — FLUTICASONE PROPIONATE 50 MCG/ACT NA SUSP: 2.0000 | Freq: Every day | NASAL | 3 refills | Status: DC

## 2016-03-11 NOTE — Telephone Encounter (Signed)
The requested med has been sent to the pharmacy.  Please let the patient know. Thanks, WS 

## 2016-03-11 NOTE — Telephone Encounter (Signed)
Patient aware.

## 2016-03-11 NOTE — Telephone Encounter (Signed)
Please review and advise.

## 2016-03-11 NOTE — Telephone Encounter (Signed)
Not on med list

## 2016-03-12 DIAGNOSIS — G4719 Other hypersomnia: Secondary | ICD-10-CM | POA: Diagnosis not present

## 2016-03-12 DIAGNOSIS — R0683 Snoring: Secondary | ICD-10-CM | POA: Diagnosis not present

## 2016-03-12 DIAGNOSIS — R5383 Other fatigue: Secondary | ICD-10-CM | POA: Diagnosis not present

## 2016-03-12 DIAGNOSIS — E6609 Other obesity due to excess calories: Secondary | ICD-10-CM | POA: Diagnosis not present

## 2016-04-03 ENCOUNTER — Telehealth: Payer: Self-pay | Admitting: Family Medicine

## 2016-04-03 NOTE — Telephone Encounter (Signed)
Please  write and I will sign. Thanks, WS

## 2016-04-04 NOTE — Telephone Encounter (Signed)
I dont know this patient, if you write, I will type

## 2016-04-07 NOTE — Telephone Encounter (Signed)
Spoke with pt to inform letter is ready Wants to pick letter up here Letter to front for pt pick up

## 2016-04-08 DIAGNOSIS — G4710 Hypersomnia, unspecified: Secondary | ICD-10-CM | POA: Diagnosis not present

## 2016-04-11 DIAGNOSIS — Z1231 Encounter for screening mammogram for malignant neoplasm of breast: Secondary | ICD-10-CM | POA: Diagnosis not present

## 2016-04-17 ENCOUNTER — Ambulatory Visit (INDEPENDENT_AMBULATORY_CARE_PROVIDER_SITE_OTHER): Payer: Medicare Other | Admitting: Family Medicine

## 2016-04-17 ENCOUNTER — Encounter: Payer: Self-pay | Admitting: Family Medicine

## 2016-04-17 ENCOUNTER — Ambulatory Visit (INDEPENDENT_AMBULATORY_CARE_PROVIDER_SITE_OTHER): Payer: Medicare Other

## 2016-04-17 VITALS — BP 151/92 | HR 91 | Temp 97.0°F | Ht 60.0 in

## 2016-04-17 DIAGNOSIS — I10 Essential (primary) hypertension: Secondary | ICD-10-CM

## 2016-04-17 DIAGNOSIS — G319 Degenerative disease of nervous system, unspecified: Secondary | ICD-10-CM

## 2016-04-17 DIAGNOSIS — R279 Unspecified lack of coordination: Secondary | ICD-10-CM

## 2016-04-17 DIAGNOSIS — R531 Weakness: Secondary | ICD-10-CM | POA: Diagnosis not present

## 2016-04-17 DIAGNOSIS — E039 Hypothyroidism, unspecified: Secondary | ICD-10-CM

## 2016-04-17 DIAGNOSIS — R278 Other lack of coordination: Secondary | ICD-10-CM

## 2016-04-17 MED ORDER — LISINOPRIL 20 MG PO TABS: 20.0000 mg | ORAL_TABLET | Freq: Two times a day (BID) | ORAL | 3 refills | Status: DC

## 2016-04-17 NOTE — Progress Notes (Signed)
Your chest x-ray showed no evidence for pneumonia. Thanks, WS.

## 2016-04-17 NOTE — Progress Notes (Addendum)
Subjective:  Patient ID: Tamara Stein, female    DOB: 1941/08/02  Age: 75 y.o. MRN: [id number]  CC: Medication Refill (pt here today for a 6 week recheck after starting Lisinopril/HCTZ. Pt would also like to discuss knee injections.)   HPI Tamara Stein presents for  follow-up of hypertension. Patient has no history of headache chest pain or shortness of breath. She does have some cough. Patient also denies symptoms of TIA such as numbness although she is very weak none of the weakness is lateralizing over the last months her weakness seems to have increased. She says that she's had some falls. She actually is been falling out of her wheelchair when she tries to reach. Additionally she is dropping things when she tries to pick up things as simple as a glass of tea or water. This happens constantly. She denies any side effects from the new lisinopril prescription.     History Tamara Stein has a past medical history of Allergy; Anxiety; Cataract; Cerebellar degeneration; Chronic kidney disease; Chronic knee pain; Depression; GERD (gastroesophageal reflux disease); Hyperlipidemia; Hypertension; and Thyroid disease.   She has a past surgical history that includes Cholecystectomy; Abdominal hysterectomy; and RIGHT ELBOW.   Her family history includes Arthritis in her sister; Cancer in her brother; Diabetes in her brother; Heart disease in her brother; Hyperlipidemia in her sister; Hypertension in her sister.She reports that she has never smoked. She has never used smokeless tobacco. She reports that she does not drink alcohol or use drugs.  Current Outpatient Prescriptions on File Prior to Visit  Medication Sig Dispense Refill  . aspirin EC 81 MG tablet Take 81 mg by mouth every morning.     Marland Kitchen atorvastatin (LIPITOR) 40 MG tablet Take 1 tablet by mouth at  bedtime 90 tablet 1  . Calcium Carbonate-Vitamin D (CALTRATE 600+D PO) Take 1 tablet by mouth daily.    . diclofenac (VOLTAREN) 50 MG EC  tablet Take 1 tablet by mouth 3  times daily 270 tablet 0  . DULoxetine (CYMBALTA) 60 MG capsule Take 1 capsule by mouth  daily 90 capsule 0  . ferrous sulfate 325 (65 FE) MG tablet Take 325 mg by mouth daily with breakfast. Reported on 02/29/2016    . fluticasone (FLONASE) 50 MCG/ACT nasal spray Place 2 sprays into both nostrils daily. 48 g 3  . levothyroxine (SYNTHROID, LEVOTHROID) 50 MCG tablet Take 1 tablet by mouth  daily before breakfast 90 tablet 1  . meclizine (ANTIVERT) 25 MG tablet Take 1 tablet (25 mg total) by mouth 3 (three) times daily as needed for dizziness. 270 tablet 0  . Multiple Vitamin (MULTIVITAMIN WITH MINERALS) TABS tablet Take 1 tablet by mouth daily.    Marland Kitchen omeprazole (PRILOSEC) 40 MG capsule Take 1 capsule by mouth  daily 90 capsule 1  . raloxifene (EVISTA) 60 MG tablet Take 1 tablet by mouth  daily 90 tablet 1  . traMADol (ULTRAM) 50 MG tablet Take 1 tablet (50 mg total) by mouth 4 (four) times daily as needed for moderate pain. 60 tablet 02   No current facility-administered medications on file prior to visit.     ROS Review of Systems  Constitutional: Positive for fatigue. Negative for activity change (wheelchair-bound), appetite change and fever.  HENT: Negative for congestion, rhinorrhea and sore throat.   Eyes: Negative for visual disturbance.  Respiratory: Negative for cough and shortness of breath.   Cardiovascular: Negative for chest pain and palpitations.  Gastrointestinal: Negative for abdominal pain, diarrhea and  nausea.  Genitourinary: Negative for dysuria.  Musculoskeletal: Positive for myalgias.  Neurological: Positive for dizziness, tremors, speech difficulty and weakness.    Objective:  BP (!) 151/92   Pulse 91   Temp 97 F (36.1 C) (Oral)   Ht 5' (1.524 m)   BP Readings from Last 3 Encounters:  04/17/16 (!) 151/92  02/29/16 127/67  12/21/15 (!) 175/85    Wt Readings from Last 3 Encounters:  01/21/15 215 lb (97.5 kg)  03/27/14 210 lb  8.6 oz (95.5 kg)     Physical Exam  Constitutional: She is oriented to person, place, and time. She appears well-developed and well-nourished. No distress.  HENT:  Head: Normocephalic and atraumatic.  Right Ear: External ear normal.  Left Ear: External ear normal.  Nose: Nose normal.  Mouth/Throat: Oropharynx is clear and moist.  Eyes: Conjunctivae and EOM are normal. Pupils are equal, round, and reactive to light.  Neck: Normal range of motion. Neck supple. No thyromegaly present.  Cardiovascular: Normal rate, regular rhythm and normal heart sounds.   No murmur heard. Pulmonary/Chest: Effort normal. No respiratory distress. She has wheezes (bilaterally at bases). She has no rales.  Abdominal: Soft. Bowel sounds are normal. She exhibits no distension. There is no tenderness.  Lymphadenopathy:    She has no cervical adenopathy.  Neurological: She is alert and oriented to person, place, and time. She has normal reflexes. Coordination (wheelchair bound due to cerebellar degeneration and inability to coordinate steps for ambulation) abnormal.  Skin: Skin is warm and dry.  Psychiatric: She has a normal mood and affect. Her behavior is normal. Judgment and thought content normal.     Lab Results  Component Value Date   WBC 4.2 02/29/2016   HGB 10.9 (L) 01/21/2015   HCT 29.9 (L) 02/29/2016   PLT 209 02/29/2016   GLUCOSE 101 (H) 02/29/2016   CHOL 138 06/19/2015   TRIG 106 06/19/2015   HDL 44 06/19/2015   LDLCALC 73 06/19/2015   ALT 12 02/29/2016   AST 20 02/29/2016   NA 144 02/29/2016   K 5.2 02/29/2016   CL 108 (H) 02/29/2016   CREATININE 1.47 (H) 02/29/2016   BUN 40 (H) 02/29/2016   CO2 21 02/29/2016   TSH 2.360 02/29/2016   HGBA1C 5.9 (H) 03/25/2014    Dg Chest 2 View  Result Date: 01/21/2015 CLINICAL DATA:  75 year old female with shortness breath and productive cough for the past 2-3 days. EXAM: CHEST  2 VIEW COMPARISON:  Chest x-ray a 03/25/2014. FINDINGS: Lung  volumes are normal. No consolidative airspace disease. No pleural effusions. Mild diffuse peribronchial cuffing and interstitial prominence. Heart size is mildly enlarged. Upper mediastinal contours are within normal limits allowing for patient rotation to the left. Large hiatal hernia. Atherosclerotic calcifications in the thoracic aorta. IMPRESSION: 1. Mild diffuse peribronchial cuffing and interstitial prominence may reflect a mild bronchitis. 2. Mild cardiomegaly. 3. Atherosclerosis. 4. Large hiatal hernia. Electronically Signed   By: Vinnie Langton M.D.   On: 01/21/2015 10:36    Assessment & Plan:   Evangela was seen today for medication refill.  Diagnoses and all orders for this visit:  Essential hypertension -     DG Chest 2 View; Future  Hypothyroidism, unspecified hypothyroidism type -     DG Chest 2 View; Future  Cerebellar degeneration -     DG Chest 2 View; Future  Decreased coordination -     DG Chest 2 View; Future  Weakness -  DG Chest 2 View; Future  Other orders -     lisinopril (PRINIVIL,ZESTRIL) 20 MG tablet; Take 1 tablet (20 mg total) by mouth 2 (two) times daily.   I have discontinued Ms. Doolan's predniSONE. I have also changed her lisinopril. Additionally, I am having her maintain her aspirin EC, multivitamin with minerals, Calcium Carbonate-Vitamin D (CALTRATE 600+D PO), ferrous sulfate, meclizine, levothyroxine, raloxifene, atorvastatin, omeprazole, diclofenac, traMADol, DULoxetine, and fluticasone.  Meds ordered this encounter  Medications  . lisinopril (PRINIVIL,ZESTRIL) 20 MG tablet    Sig: Take 1 tablet (20 mg total) by mouth 2 (two) times daily.    Dispense:  180 tablet    Refill:  3      Follow-up: Return in about 1 month (around 05/17/2016) for hypertension.  Claretta Fraise, M.D.

## 2016-04-17 NOTE — Addendum Note (Signed)
Addended by: Claretta Fraise on: 04/17/2016 01:58 PM   Modules accepted: Orders

## 2016-04-21 DIAGNOSIS — G4733 Obstructive sleep apnea (adult) (pediatric): Secondary | ICD-10-CM | POA: Diagnosis not present

## 2016-04-21 DIAGNOSIS — G4719 Other hypersomnia: Secondary | ICD-10-CM | POA: Diagnosis not present

## 2016-04-21 DIAGNOSIS — E6609 Other obesity due to excess calories: Secondary | ICD-10-CM | POA: Diagnosis not present

## 2016-04-22 ENCOUNTER — Telehealth: Payer: Self-pay | Admitting: Family Medicine

## 2016-04-22 NOTE — Telephone Encounter (Signed)
Please address  Don't see Valium on med list

## 2016-04-22 NOTE — Telephone Encounter (Signed)
I have never written this for her and do not recommend it. WS

## 2016-04-26 ENCOUNTER — Inpatient Hospital Stay (HOSPITAL_COMMUNITY)
Admission: EM | Admit: 2016-04-26 | Discharge: 2016-04-28 | DRG: 690 | Disposition: A | Discharge status: Home Health | Payer: Medicare Other | Attending: Internal Medicine | Admitting: Internal Medicine

## 2016-04-26 ENCOUNTER — Emergency Department (HOSPITAL_COMMUNITY): Payer: Medicare Other

## 2016-04-26 ENCOUNTER — Observation Stay (HOSPITAL_COMMUNITY): Payer: Medicare Other

## 2016-04-26 ENCOUNTER — Encounter (HOSPITAL_COMMUNITY): Payer: Self-pay | Admitting: Emergency Medicine

## 2016-04-26 DIAGNOSIS — R51 Headache: Secondary | ICD-10-CM | POA: Diagnosis not present

## 2016-04-26 DIAGNOSIS — K219 Gastro-esophageal reflux disease without esophagitis: Secondary | ICD-10-CM | POA: Diagnosis not present

## 2016-04-26 DIAGNOSIS — S0990XA Unspecified injury of head, initial encounter: Secondary | ICD-10-CM | POA: Diagnosis not present

## 2016-04-26 DIAGNOSIS — E785 Hyperlipidemia, unspecified: Secondary | ICD-10-CM | POA: Diagnosis present

## 2016-04-26 DIAGNOSIS — Z881 Allergy status to other antibiotic agents status: Secondary | ICD-10-CM

## 2016-04-26 DIAGNOSIS — N390 Urinary tract infection, site not specified: Principal | ICD-10-CM | POA: Diagnosis present

## 2016-04-26 DIAGNOSIS — Z880 Allergy status to penicillin: Secondary | ICD-10-CM

## 2016-04-26 DIAGNOSIS — Z7951 Long term (current) use of inhaled steroids: Secondary | ICD-10-CM

## 2016-04-26 DIAGNOSIS — I10 Essential (primary) hypertension: Secondary | ICD-10-CM | POA: Diagnosis present

## 2016-04-26 DIAGNOSIS — Z885 Allergy status to narcotic agent status: Secondary | ICD-10-CM

## 2016-04-26 DIAGNOSIS — F329 Major depressive disorder, single episode, unspecified: Secondary | ICD-10-CM | POA: Diagnosis present

## 2016-04-26 DIAGNOSIS — F32A Depression, unspecified: Secondary | ICD-10-CM | POA: Diagnosis present

## 2016-04-26 DIAGNOSIS — R55 Syncope and collapse: Secondary | ICD-10-CM | POA: Diagnosis present

## 2016-04-26 DIAGNOSIS — Z884 Allergy status to anesthetic agent status: Secondary | ICD-10-CM

## 2016-04-26 DIAGNOSIS — Z882 Allergy status to sulfonamides status: Secondary | ICD-10-CM

## 2016-04-26 DIAGNOSIS — Z79899 Other long term (current) drug therapy: Secondary | ICD-10-CM

## 2016-04-26 DIAGNOSIS — W19XXXA Unspecified fall, initial encounter: Secondary | ICD-10-CM | POA: Insufficient documentation

## 2016-04-26 DIAGNOSIS — Z833 Family history of diabetes mellitus: Secondary | ICD-10-CM

## 2016-04-26 DIAGNOSIS — I499 Cardiac arrhythmia, unspecified: Secondary | ICD-10-CM | POA: Diagnosis present

## 2016-04-26 DIAGNOSIS — Z7982 Long term (current) use of aspirin: Secondary | ICD-10-CM

## 2016-04-26 DIAGNOSIS — N179 Acute kidney failure, unspecified: Secondary | ICD-10-CM | POA: Diagnosis present

## 2016-04-26 DIAGNOSIS — Z23 Encounter for immunization: Secondary | ICD-10-CM

## 2016-04-26 DIAGNOSIS — R000 Tachycardia, unspecified: Secondary | ICD-10-CM | POA: Diagnosis not present

## 2016-04-26 DIAGNOSIS — Z8249 Family history of ischemic heart disease and other diseases of the circulatory system: Secondary | ICD-10-CM

## 2016-04-26 DIAGNOSIS — W050XXA Fall from non-moving wheelchair, initial encounter: Secondary | ICD-10-CM | POA: Diagnosis present

## 2016-04-26 DIAGNOSIS — E039 Hypothyroidism, unspecified: Secondary | ICD-10-CM | POA: Diagnosis present

## 2016-04-26 LAB — CBC WITH DIFFERENTIAL/PLATELET
BASOS ABS: 0.1 10*3/uL (ref 0.0–0.1)
BASOS PCT: 1 %
EOS ABS: 0.1 10*3/uL (ref 0.0–0.7)
EOS PCT: 2 %
HCT: 34.1 % — ABNORMAL LOW (ref 36.0–46.0)
HEMOGLOBIN: 10.9 g/dL — AB (ref 12.0–15.0)
LYMPHS ABS: 1.3 10*3/uL (ref 0.7–4.0)
Lymphocytes Relative: 17 %
MCH: 29.9 pg (ref 26.0–34.0)
MCHC: 32 g/dL (ref 30.0–36.0)
MCV: 93.4 fL (ref 78.0–100.0)
Monocytes Absolute: 0.7 10*3/uL (ref 0.1–1.0)
Monocytes Relative: 9 %
NEUTROS PCT: 71 %
Neutro Abs: 5.6 10*3/uL (ref 1.7–7.7)
PLATELETS: 198 10*3/uL (ref 150–400)
RBC: 3.65 MIL/uL — AB (ref 3.87–5.11)
RDW: 15 % (ref 11.5–15.5)
WBC: 7.8 10*3/uL (ref 4.0–10.5)

## 2016-04-26 LAB — I-STAT TROPONIN, ED: TROPONIN I, POC: 0 ng/mL (ref 0.00–0.08)

## 2016-04-26 LAB — BASIC METABOLIC PANEL
Anion gap: 5 (ref 5–15)
BUN: 26 mg/dL — AB (ref 6–20)
CALCIUM: 9.7 mg/dL (ref 8.9–10.3)
CO2: 29 mmol/L (ref 22–32)
CREATININE: 1.09 mg/dL — AB (ref 0.44–1.00)
Chloride: 107 mmol/L (ref 101–111)
GFR, EST AFRICAN AMERICAN: 56 mL/min — AB (ref >60)
GFR, EST NON AFRICAN AMERICAN: 48 mL/min — AB (ref >60)
Glucose, Bld: 101 mg/dL — ABNORMAL HIGH (ref 65–99)
Potassium: 3.9 mmol/L (ref 3.5–5.1)
SODIUM: 141 mmol/L (ref 135–145)

## 2016-04-26 LAB — URINE MICROSCOPIC-ADD ON

## 2016-04-26 LAB — URINALYSIS, ROUTINE W REFLEX MICROSCOPIC
BILIRUBIN URINE: NEGATIVE
Glucose, UA: NEGATIVE mg/dL
KETONES UR: 15 mg/dL — AB
Leukocytes, UA: NEGATIVE
NITRITE: POSITIVE — AB
PH: 5.5 (ref 5.0–8.0)
PROTEIN: 30 mg/dL — AB
Specific Gravity, Urine: 1.015 (ref 1.005–1.030)

## 2016-04-26 MED ORDER — TRAMADOL HCL 50 MG PO TABS
50.0000 mg | ORAL_TABLET | Freq: Four times a day (QID) | ORAL | Status: DC | PRN
  Administered 2016-04-26 – 2016-04-28 (×3): 50 mg via ORAL
  Filled 2016-04-26 (×3): qty 1

## 2016-04-26 MED ORDER — ADULT MULTIVITAMIN W/MINERALS CH
1.0000 | ORAL_TABLET | Freq: Every day | ORAL | Status: DC
  Administered 2016-04-27 – 2016-04-28 (×2): 1 via ORAL
  Filled 2016-04-26 (×2): qty 1

## 2016-04-26 MED ORDER — ONDANSETRON HCL 4 MG PO TABS: 4.0000 mg | ORAL_TABLET | Freq: Four times a day (QID) | ORAL | Status: DC | PRN

## 2016-04-26 MED ORDER — SODIUM CHLORIDE 0.9 % IV SOLN
INTRAVENOUS | Status: DC
  Administered 2016-04-26 – 2016-04-28 (×2): via INTRAVENOUS

## 2016-04-26 MED ORDER — SODIUM CHLORIDE 0.9% FLUSH
3.0000 mL | Freq: Two times a day (BID) | INTRAVENOUS | Status: DC
  Administered 2016-04-26: 3 mL via INTRAVENOUS

## 2016-04-26 MED ORDER — PNEUMOCOCCAL VAC POLYVALENT 25 MCG/0.5ML IJ INJ
0.5000 mL | INJECTION | INTRAMUSCULAR | Status: AC
  Administered 2016-04-28: 0.5 mL via INTRAMUSCULAR
  Filled 2016-04-26 (×2): qty 0.5

## 2016-04-26 MED ORDER — FLUTICASONE PROPIONATE 50 MCG/ACT NA SUSP
2.0000 | Freq: Every day | NASAL | Status: DC
  Administered 2016-04-27 – 2016-04-28 (×2): 2 via NASAL
  Filled 2016-04-26: qty 16

## 2016-04-26 MED ORDER — DEXTROSE 5 % IV SOLN
1.0000 g | Freq: Once | INTRAVENOUS | Status: AC
  Administered 2016-04-26: 1 g via INTRAVENOUS
  Filled 2016-04-26: qty 10

## 2016-04-26 MED ORDER — LISINOPRIL 20 MG PO TABS
20.0000 mg | ORAL_TABLET | Freq: Once | ORAL | Status: AC
  Administered 2016-04-26: 20 mg via ORAL
  Filled 2016-04-26: qty 1

## 2016-04-26 MED ORDER — CALCIUM CARBONATE-VITAMIN D 500-200 MG-UNIT PO TABS
1.0000 | ORAL_TABLET | Freq: Every day | ORAL | Status: DC
  Administered 2016-04-27 – 2016-04-28 (×2): 1 via ORAL
  Filled 2016-04-26 (×2): qty 1

## 2016-04-26 MED ORDER — LEVOTHYROXINE SODIUM 50 MCG PO TABS
50.0000 ug | ORAL_TABLET | Freq: Every day | ORAL | Status: DC
  Administered 2016-04-27 – 2016-04-28 (×2): 50 ug via ORAL
  Filled 2016-04-26 (×2): qty 1

## 2016-04-26 MED ORDER — INFLUENZA VAC SPLIT QUAD 0.5 ML IM SUSY
0.5000 mL | PREFILLED_SYRINGE | INTRAMUSCULAR | Status: AC
  Administered 2016-04-28: 0.5 mL via INTRAMUSCULAR
  Filled 2016-04-26 (×2): qty 0.5

## 2016-04-26 MED ORDER — CALCIUM CARBONATE-VITAMIN D 600-400 MG-UNIT PO TABS: ORAL_TABLET | Freq: Every day | ORAL | Status: DC

## 2016-04-26 MED ORDER — DULOXETINE HCL 60 MG PO CPEP
60.0000 mg | ORAL_CAPSULE | Freq: Every day | ORAL | Status: DC
  Administered 2016-04-26 – 2016-04-28 (×3): 60 mg via ORAL
  Filled 2016-04-26 (×3): qty 1

## 2016-04-26 MED ORDER — LISINOPRIL 20 MG PO TABS
20.0000 mg | ORAL_TABLET | Freq: Two times a day (BID) | ORAL | Status: DC
  Administered 2016-04-26 (×2): 20 mg via ORAL
  Filled 2016-04-26 (×2): qty 1

## 2016-04-26 MED ORDER — DEXTROSE 5 % IV SOLN
1.0000 g | INTRAVENOUS | Status: DC
  Administered 2016-04-27: 1 g via INTRAVENOUS
  Filled 2016-04-26 (×3): qty 10

## 2016-04-26 MED ORDER — ATORVASTATIN CALCIUM 40 MG PO TABS
40.0000 mg | ORAL_TABLET | Freq: Every day | ORAL | Status: DC
  Administered 2016-04-26 – 2016-04-28 (×2): 40 mg via ORAL
  Filled 2016-04-26 (×2): qty 1

## 2016-04-26 MED ORDER — RALOXIFENE HCL 60 MG PO TABS
60.0000 mg | ORAL_TABLET | Freq: Every day | ORAL | Status: DC
  Administered 2016-04-26 – 2016-04-28 (×3): 60 mg via ORAL
  Filled 2016-04-26 (×4): qty 1

## 2016-04-26 MED ORDER — ENOXAPARIN SODIUM 40 MG/0.4ML ~~LOC~~ SOLN
40.0000 mg | SUBCUTANEOUS | Status: DC
  Administered 2016-04-26 – 2016-04-27 (×2): 40 mg via SUBCUTANEOUS
  Filled 2016-04-26 (×2): qty 0.4

## 2016-04-26 MED ORDER — DICLOFENAC SODIUM 50 MG PO TBEC
50.0000 mg | DELAYED_RELEASE_TABLET | Freq: Three times a day (TID) | ORAL | Status: DC
  Administered 2016-04-26 – 2016-04-28 (×6): 50 mg via ORAL
  Filled 2016-04-26 (×7): qty 1

## 2016-04-26 MED ORDER — MECLIZINE HCL 25 MG PO TABS: 25.0000 mg | ORAL_TABLET | Freq: Three times a day (TID) | ORAL | Status: DC | PRN

## 2016-04-26 MED ORDER — PANTOPRAZOLE SODIUM 40 MG PO TBEC
40.0000 mg | DELAYED_RELEASE_TABLET | Freq: Every day | ORAL | Status: DC
  Administered 2016-04-26 – 2016-04-28 (×3): 40 mg via ORAL
  Filled 2016-04-26 (×3): qty 1

## 2016-04-26 MED ORDER — HYDRALAZINE HCL 20 MG/ML IJ SOLN
5.0000 mg | INTRAMUSCULAR | Status: DC | PRN
  Administered 2016-04-26: 5 mg via INTRAVENOUS
  Filled 2016-04-26: qty 1

## 2016-04-26 MED ORDER — ONDANSETRON HCL 4 MG/2ML IJ SOLN
4.0000 mg | Freq: Four times a day (QID) | INTRAMUSCULAR | Status: DC | PRN
  Administered 2016-04-27: 4 mg via INTRAVENOUS
  Filled 2016-04-26: qty 2

## 2016-04-26 MED ORDER — ASPIRIN EC 81 MG PO TBEC
81.0000 mg | DELAYED_RELEASE_TABLET | Freq: Every morning | ORAL | Status: DC
  Administered 2016-04-26 – 2016-04-28 (×3): 81 mg via ORAL
  Filled 2016-04-26 (×3): qty 1

## 2016-04-26 MED ORDER — FERROUS SULFATE 325 (65 FE) MG PO TABS
325.0000 mg | ORAL_TABLET | Freq: Every day | ORAL | Status: DC
  Administered 2016-04-27 – 2016-04-28 (×2): 325 mg via ORAL
  Filled 2016-04-26 (×2): qty 1

## 2016-04-26 NOTE — ED Provider Notes (Signed)
Emergency Department Provider Note   I have reviewed the triage vital signs and the nursing notes.   HISTORY  Chief Complaint Fall   HPI Tamara Stein is a 75 y.o. female with PMH of anxiety, GERD, depression, CKD, HLD, HTN presents to the emergency department for evaluation after mechanical fall and possible syncope event. Patient states she was transferring to her wheelchair from the seated position when she fell to the ground. She states she landed on her buttocks. She states she was then scooting around the house on the floor trying to reach a phone when she believes she passed out. During this the patient states she may have hit her head as a residual headache that has improved somewhat since the event. No vomiting. No neck discomfort. No weakness in the arms or legs. The patient has no prior history of syncope. She is unsure exactly how long she was unconscious but believes it was a brief period of time. She was able to make it to her telephone and call for help.   Patient states she is also worried about her elevated blood pressures but did not take her morning lisinopril. She denies any chest pain or difficulty breathing. No recent fevers. She does note diffuse weakness over the past several days with some dysuria noted on review of systems.   Past Medical History:  Diagnosis Date  . Allergy   . Anxiety   . Cataract   . Cerebellar degeneration   . Chronic kidney disease   . Chronic knee pain   . Depression   . GERD (gastroesophageal reflux disease)   . Hyperlipidemia   . Hypertension   . Thyroid disease     Patient Active Problem List   Diagnosis Date Noted  . Pain of both shoulder joints 02/29/2016  . Arthritis of knee 09/24/2015  . Constipation 06/05/2015  . Depression 06/05/2015  . Hypothyroidism 06/05/2015  . GERD (gastroesophageal reflux disease)   . Hypertension   . Hyperlipidemia   . Cerebellar degeneration     Past Surgical History:  Procedure  Laterality Date  . ABDOMINAL HYSTERECTOMY    . CHOLECYSTECTOMY    . RIGHT ELBOW      Current Outpatient Rx  . Order #: [id number] Class: Historical Med  . Order #: [id number] Class: Normal  . Order #: [id number] Class: Historical Med  . Order #: [id number] Class: Normal  . Order #: [id number] Class: Normal  . Order #: [id number] Class: Historical Med  . Order #: [id number] Class: Normal  . Order #: [id number] Class: Normal  . Order #: [id number] Class: Normal  . Order #: [id number] Class: Normal  . Order #: [id number] Class: Historical Med  . Order #: [id number] Class: Normal  . Order #: [id number] Class: Normal  . Order #: [id number] Class: Print    Allergies Sulfa antibiotics; Codeine; Erythromycin; and Penicillins  Family History  Problem Relation Age of Onset  . Arthritis Sister   . Hyperlipidemia Sister   . Hypertension Sister   . Cancer Brother   . Diabetes Brother   . Heart disease Brother     Social History Social History  Substance Use Topics  . Smoking status: Never Smoker  . Smokeless tobacco: Never Used  . Alcohol use No    Review of Systems  Constitutional: No fever/chills; positive generalize weakness.  Eyes: No visual changes. ENT: No sore throat. Cardiovascular: Denies chest pain. Positive syncope.  Respiratory: Denies shortness of breath. Gastrointestinal: No abdominal pain.  No nausea, no vomiting.  No diarrhea.  No constipation. Genitourinary: Positive for dysuria.  Musculoskeletal: Negative for back pain. Skin: Negative for rash. Neurological: Negative for focal weakness or numbness. Positive HA since head injury.   10-point ROS otherwise negative.  ____________________________________________   PHYSICAL EXAM:  VITAL SIGNS: ED Triage Vitals  Enc Vitals Group     BP 04/26/16 0953 180/100     Pulse Rate 04/26/16 0953 92     Resp 04/26/16 0953 16     Temp 04/26/16 0953 97.9 F (36.6 C)     Temp Source 04/26/16 0953 Oral     SpO2 04/26/16 0950 95 %    Constitutional: Alert and oriented. Well appearing and in no acute distress. Smells of urine.  Eyes: Conjunctivae are normal. PERRL.  Head: Atraumatic. Nose: No congestion/rhinnorhea. Mouth/Throat: Mucous membranes are moist.  Oropharynx non-erythematous. Neck: No stridor. No cervical spine tenderness to palpation. Full ROM without pain.  Cardiovascular: Normal rate, regular rhythm. Good peripheral circulation. 3/6 systolic murmur appreciated.  Respiratory: Normal respiratory effort.  No retractions. Lungs CTAB. Gastrointestinal: Soft and nontender. No distention.  Musculoskeletal: No lower extremity edema. Positive bilateral knee tenderness. No gross deformities of extremities. Neurologic: Slurred speech (baseline). No gross focal neurologic deficits are appreciated.  Skin:  Skin is warm, dry and intact. No rash noted. Psychiatric: Mood and affect are normal. Speech and behavior are normal.  ____________________________________________   LABS (all labs ordered are listed, but only abnormal results are displayed)  Labs Reviewed  BASIC METABOLIC PANEL - Abnormal; Notable for the following:       Result Value   Glucose, Bld 101 (*)    BUN 26 (*)    Creatinine, Ser 1.09 (*)    GFR calc non Af Amer 48 (*)    GFR calc Af Amer 56 (*)    All other components within normal limits  CBC WITH DIFFERENTIAL/PLATELET - Abnormal; Notable for the following:    RBC 3.65 (*)    Hemoglobin 10.9 (*)    HCT 34.1 (*)    All other components within normal limits  URINALYSIS, ROUTINE W REFLEX MICROSCOPIC (NOT AT [facility]) - Abnormal; Notable for the following:    Hgb urine dipstick TRACE (*)    Ketones, ur 15 (*)    Protein, ur 30 (*)    Nitrite POSITIVE (*)    All other components within normal limits  URINE MICROSCOPIC-ADD ON - Abnormal; Notable for the following:    Squamous Epithelial / LPF 0-5 (*)    Bacteria, UA MANY (*)    All other components within normal limits  URINE CULTURE  BASIC  METABOLIC PANEL  CBC  I-STAT TROPOININ, ED   ____________________________________________  EKG   EKG Interpretation  Date/Time:  Saturday April 26 2016 10:47:45 EDT Ventricular Rate:  82 PR Interval:    QRS Duration: 98 QT Interval:  382 QTC Calculation: 447 R Axis:   5 Text Interpretation:  Sinus rhythm Low voltage, precordial leads No STEMI.  Similar to prior tracings.  Confirmed by LONG MD, JOSHUA [id number]) on 04/26/2016 10:54:21 AM       ____________________________________________  RADIOLOGY  Ct Head Wo Contrast  Result Date: 04/26/2016 CLINICAL DATA:  fall this Am from her mobile chair. denies loc nor head injury. No pain , nor injury yet pt requesting BP control. Hx HTN. Denies chest pain nor headache no further symptoms. Alert and oriented x 4. Pt has slurred speech Normal to baseline. EXAM: CT HEAD WITHOUT CONTRAST TECHNIQUE: Contiguous axial images were obtained from the base of the skull through the  vertex without intravenous contrast. COMPARISON:  Head CT 08/15/ 2015 in FINDINGS: Brain: No acute intracranial hemorrhage. No focal mass lesion. No CT evidence of acute infarction. No midline shift or mass effect. No hydrocephalus. Basilar cisterns are patent. There are periventricular and subcortical white matter hypodensities. Generalized cortical atrophy. Vascular: No hyperdense vessel or unexpected calcification. Skull: Normal. Negative for fracture or focal lesion. Sinuses/Orbits: No acute finding. Other: None IMPRESSION: 1. No acute intracranial findings. 2. 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 Electronically Signed   By: Suzy Bouchard M.D.   On: 04/26/2016 11:21    ____________________________________________   PROCEDURES  Procedure(s) performed:   Procedures  None ____________________________________________   INITIAL IMPRESSION / ASSESSMENT AND PLAN / ED COURSE  Pertinent labs & imaging results that were available during my care of  the patient were reviewed by me and considered in my medical decision making (see chart for details).  Patient resents the emergency department for evaluation of mechanical fall, elevated BP, and apparent syncope. While scooting around the house to try and find the patient apparently had a syncopal event was not directly related to trauma. Patient denies any chest pain or difficulty breathing. She is overall well-appearing. She has full range of motion of both hips and knees with baseline tenderness in both knees. Blood pressure is slightly elevated. Will give her morning dose of lisinopril. Plan for CT scan of the head with apparent head trauma during syncopal event.  In terms of the initial mechanical fall the patient landed on her buttocks with no appreciable injury. While exerting herself and scooting around the house she apparently had a syncopal event. With her systolic murmur and syncope I'm concerned for possible valvular dysfunction. An echocardiogram from 2015 shows trivial regurgitation at the aortic valve. Plan for labs, urinalysis (given report of dysuria and generalized weakness), and EKG.   01:06 PM Updated patient regarding imaging and lab results. Have started ceftriaxone with UTI. We will admit the patient for syncope evaluation and treatment of UTI.  ____________________________________________  FINAL CLINICAL IMPRESSION(S) / ED DIAGNOSES  Final diagnoses:  Fall, initial encounter  Head injury, initial encounter  UTI (lower urinary tract infection)  Syncope and collapse  Syncope     MEDICATIONS GIVEN DURING THIS VISIT:  Medications  lisinopril (PRINIVIL,ZESTRIL) tablet 20 mg (not administered)     NEW OUTPATIENT MEDICATIONS STARTED DURING THIS VISIT:  None   Note:  This document was prepared using Dragon voice recognition software and may include unintentional dictation errors.  Nanda Quinton, MD Emergency Medicine   Margette Fast, MD 04/26/16 [id number]

## 2016-04-26 NOTE — H&P (Signed)
History and Physical    Tamara Stein OZH:[id number] DOB: 1940-09-26 DOA: 04/26/2016  Referring MD/NP/PA:   PCP: Claretta Fraise, MD   Patient coming from:  The patient is coming from home.  At baseline, pt is independent for most of ADL.   Chief Complaint: fall, syncope, burning on urination  HPI: Tamara Stein is a 75 y.o. female with medical history significant of hypertension, hyperlipidemia, GERD, hypothyroidism, depression, anxiety, chronic back pain, slurred speech at baseline, who presents with fall, syncope, burning on urination.  Patient states she was transferring to her wheelchair from the seated position when she fell to the ground. She states she landed on her buttocks. She states she was then scooting around the house on the floor trying to reach a phone when she believes she passed out. She states that she likely injured her head. She has a mild headache currently. No neck pain. She is unsure exactly how long she was unconscious but believes it was a brief period of time. She was finally able to make it to her telephone and call for help.   Patient has a slurred speech at baseline, which has no significant change. She has burning on urination, but denies dysuria or urinary frequency. She does not have nausea, vomiting, diarrhea, abdominal pain. Denies chest pain, shortness breath, cough, fever or chills.  ED Course: pt was found to have positive urinalysis with positive nitrate, negative troponin, WBC 7.8, temperature normal, tachycardia, tachypnea, electrolytes and renal function okay, negative CT head of acute intracranial abnormalities.  Review of Systems:   General: no fevers, chills, no changes in body weight, has fatigue and HA HEENT: no blurry vision, hearing changes or sore throat Respiratory: no dyspnea, coughing, wheezing CV: no chest pain, no palpitations GI: no nausea, vomiting, abdominal pain, diarrhea, constipation GU: no dysuria, has burning on  urination, no increased urinary frequency, hematuria  Ext: no leg edema Neuro: no unilateral weakness, numbness, or tingling, no vision change or hearing loss. Has slurred speech. Had syncope. Skin: no rash, no skin tear. MSK: No muscle spasm, no deformity, no limitation of range of movement in spin Heme: No easy bruising.  Travel history: No recent long distant travel.  Allergy:  Allergies  Allergen Reactions  . Sulfa Antibiotics Anaphylaxis    Facial swelling   . Codeine Other (See Comments)    BLACK OUTS  . Erythromycin Swelling  . Ibuprofen Other (See Comments)    Stomach pain, muscle spasm  . Penicillins Diarrhea    Has patient had a PCN reaction causing immediate rash, facial/tongue/throat swelling, SOB or lightheadedness with hypotension: No Has patient had a PCN reaction causing severe rash involving mucus membranes or skin necrosis: No Has patient had a PCN reaction that required hospitalization Unknown Has patient had a PCN reaction occurring within the last 10 years: No If all of the above answers are "NO", then may proceed with Cephalosporin use.     Past Medical History:  Diagnosis Date  . Allergy   . Anxiety   . Cataract   . Cerebellar degeneration   . Chronic kidney disease   . Chronic knee pain   . Depression   . GERD (gastroesophageal reflux disease)   . Hyperlipidemia   . Hypertension   . Thyroid disease     Past Surgical History:  Procedure Laterality Date  . ABDOMINAL HYSTERECTOMY    . CHOLECYSTECTOMY    . RIGHT ELBOW      Social History:  reports that she has never  smoked. She has never used smokeless tobacco. She reports that she does not drink alcohol or use drugs.  Family History:  Family History  Problem Relation Age of Onset  . Arthritis Sister   . Hyperlipidemia Sister   . Hypertension Sister   . Cancer Brother   . Diabetes Brother   . Heart disease Brother      Prior to Admission medications   Medication Sig Start Date End  Date Taking? Authorizing Provider  lisinopril (PRINIVIL,ZESTRIL) 20 MG tablet Take 1 tablet (20 mg total) by mouth 2 (two) times daily. 04/17/16  Yes Claretta Fraise, MD  aspirin EC 81 MG tablet Take 81 mg by mouth every morning.     Historical Provider, MD  atorvastatin (LIPITOR) 40 MG tablet Take 1 tablet by mouth at  bedtime 01/03/16   Claretta Fraise, MD  Calcium Carbonate-Vitamin D (CALTRATE 600+D PO) Take 1 tablet by mouth daily.    Historical Provider, MD  diclofenac (VOLTAREN) 50 MG EC tablet Take 1 tablet by mouth 3  times daily 01/03/16   Claretta Fraise, MD  DULoxetine (CYMBALTA) 60 MG capsule Take 1 capsule by mouth  daily 03/06/16   Claretta Fraise, MD  ferrous sulfate 325 (65 FE) MG tablet Take 325 mg by mouth daily with breakfast. Reported on 02/29/2016    Historical Provider, MD  fluticasone (FLONASE) 50 MCG/ACT nasal spray Place 2 sprays into both nostrils daily. 03/11/16   Claretta Fraise, MD  levothyroxine (SYNTHROID, LEVOTHROID) 50 MCG tablet Take 1 tablet by mouth  daily before breakfast 01/03/16   Claretta Fraise, MD  meclizine (ANTIVERT) 25 MG tablet Take 1 tablet (25 mg total) by mouth 3 (three) times daily as needed for dizziness. 08/24/15   Claretta Fraise, MD  Multiple Vitamin (MULTIVITAMIN WITH MINERALS) TABS tablet Take 1 tablet by mouth daily.    Historical Provider, MD  omeprazole (PRILOSEC) 40 MG capsule Take 1 capsule by mouth  daily 01/03/16   Claretta Fraise, MD  raloxifene (EVISTA) 60 MG tablet Take 1 tablet by mouth  daily 01/03/16   Claretta Fraise, MD  traMADol (ULTRAM) 50 MG tablet Take 1 tablet (50 mg total) by mouth 4 (four) times daily as needed for moderate pain. 03/03/16   Claretta Fraise, MD    Physical Exam: Vitals:   04/26/16 1458 04/26/16 1527 04/26/16 1620 04/26/16 1746  BP: (!) 202/166   (!) 205/95  Pulse: 86 94    Resp: 20 18    Temp:  98.1 F (36.7 C) 98.9 F (37.2 C)   TempSrc:  Oral    SpO2: 96% 96%    Height:   5' (1.524 m)    General: Not in acute  distress HEENT:       Eyes: PERRL, EOMI, no scleral icterus.       ENT: No discharge from the ears and nose, no pharynx injection, no tonsillar enlargement.        Neck: No JVD, no bruit, no mass felt. Heme: No neck lymph node enlargement. Cardiac: N9/G9, RRR, 1/6 systolic murmurs, No gallops or rubs. Respiratory: No rales, wheezing, rhonchi or rubs. GI: Soft, nondistended, nontender, no rebound pain, no organomegaly, BS present. GU: No hematuria Ext: No pitting leg edema bilaterally. 2+DP/PT pulse bilaterally. Musculoskeletal: No joint deformities, No joint redness or warmth, no limitation of ROM in spin. Skin: No rashes.  Neuro: Alert, oriented X3, cranial nerves II-XII grossly intact, moves all extremities normally. Muscle strength 5/5 in all extremities, sensation to light touch intact.  Knee reflex 1+ bilaterally. Negative Babinski's sign. Psych: Patient is not psychotic, no suicidal or hemocidal ideation.  Labs on Admission: I have personally reviewed following labs and imaging studies  CBC:  Recent Labs Lab 04/26/16 1116  WBC 7.8  NEUTROABS 5.6  HGB 10.9*  HCT 34.1*  MCV 93.4  PLT 361   Basic Metabolic Panel:  Recent Labs Lab 04/26/16 1116  NA 141  K 3.9  CL 107  CO2 29  GLUCOSE 101*  BUN 26*  CREATININE 1.09*  CALCIUM 9.7   GFR: CrCl cannot be calculated (Unknown ideal weight.). Liver Function Tests: No results for input(s): AST, ALT, ALKPHOS, BILITOT, PROT, ALBUMIN in the last 168 hours. No results for input(s): LIPASE, AMYLASE in the last 168 hours. No results for input(s): AMMONIA in the last 168 hours. Coagulation Profile: No results for input(s): INR, PROTIME in the last 168 hours. Cardiac Enzymes: No results for input(s): CKTOTAL, CKMB, CKMBINDEX, TROPONINI in the last 168 hours. BNP (last 3 results) No results for input(s): PROBNP in the last 8760 hours. HbA1C: No results for input(s): HGBA1C in the last 72 hours. CBG: No results for input(s):  GLUCAP in the last 168 hours. Lipid Profile: No results for input(s): CHOL, HDL, LDLCALC, TRIG, CHOLHDL, LDLDIRECT in the last 72 hours. Thyroid Function Tests: No results for input(s): TSH, T4TOTAL, FREET4, T3FREE, THYROIDAB in the last 72 hours. Anemia Panel: No results for input(s): VITAMINB12, FOLATE, FERRITIN, TIBC, IRON, RETICCTPCT in the last 72 hours. Urine analysis:    Component Value Date/Time   COLORURINE YELLOW 04/26/2016 1200   APPEARANCEUR CLEAR 04/26/2016 1200   APPEARANCEUR Clear 01/14/2016 1533   LABSPEC 1.015 04/26/2016 1200   PHURINE 5.5 04/26/2016 1200   GLUCOSEU NEGATIVE 04/26/2016 1200   HGBUR TRACE (A) 04/26/2016 1200   BILIRUBINUR NEGATIVE 04/26/2016 1200   BILIRUBINUR Negative 01/14/2016 1533   KETONESUR 15 (A) 04/26/2016 1200   PROTEINUR 30 (A) 04/26/2016 1200   UROBILINOGEN negative 09/06/2015 1155   UROBILINOGEN 0.2 03/25/2014 1359   NITRITE POSITIVE (A) 04/26/2016 1200   LEUKOCYTESUR NEGATIVE 04/26/2016 1200   LEUKOCYTESUR Negative 01/14/2016 1533   Sepsis Labs: @LABRCNTIP (procalcitonin:4,lacticidven:4) )No results found for this or any previous visit (from the past 240 hour(s)).   Radiological Exams on Admission: Ct Head Wo Contrast  Result Date: 04/26/2016 CLINICAL DATA:  fall this Am from her mobile chair. denies loc nor head injury. No pain , nor injury yet pt requesting BP control. Hx HTN. Denies chest pain nor headache no further symptoms. Alert and oriented x 4. Pt has slurred speech Normal to baseline. EXAM: CT HEAD WITHOUT CONTRAST TECHNIQUE: Contiguous axial images were obtained from the base of the skull through the vertex without intravenous contrast. COMPARISON:  Head CT 08/15/ 2015 in FINDINGS: Brain: No acute intracranial hemorrhage. No focal mass lesion. No CT evidence of acute infarction. No midline shift or mass effect. No hydrocephalus. Basilar cisterns are patent. There are periventricular and subcortical white matter hypodensities.  Generalized cortical atrophy. Vascular: No hyperdense vessel or unexpected calcification. Skull: Normal. Negative for fracture or focal lesion. Sinuses/Orbits: No acute finding. Other: None IMPRESSION: 1. No acute intracranial findings. 2. 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 Electronically Signed   By: Suzy Bouchard M.D.   On: 04/26/2016 11:21     EKG: Independently reviewed.  Sinus rhythm, QTC 447, no ischemic change   Assessment/Plan Principal Problem:   Syncope Active Problems:   GERD (gastroesophageal reflux disease)   Hypertension   Hyperlipidemia  Depression   Hypothyroidism   UTI (lower urinary tract infection)   Syncope and fall: Etiology is not clear. CT head is negative for acute intracranial abnormalities. Differential diagnosis include TIA/stroke, cardiac arrhythmia or ischemia, orthostatic status and vasovagal syncope. Her UTI may have contributed approximately.  - will place on tele bed for obs - Orthostatic vital signs  - MRI-brain - 2d echo - Neuro checks  - continue start ASA - PT/OT eval and treat - fall precaution -IVF: 75 cc/h NS  UTI: Urinalysis is positive for nitrite. She has burning on urination, indicating possible UTI -IV Rocephin -Follow-up blood culture and urine culture  GERD: -Protonix  HTN: -continue lisinopril  HLD: Last LDL was 73 on 06/19/15  -Continue home medications: Lipitor  Depression: Stable, no suicidal or homicidal ideations. -Continue home medications: Cymbalta,  Hypothyroidism: Last TSH was 2.360 -Continue home Synthroid   DVT ppx: SQ Lovenox Code Status: Full code Family Communication: None at bed side.  Disposition Plan:  Anticipate discharge back to previous home environment Consults called:  none Admission status: Obs / tele    Date of Service 04/26/2016    Ivor Costa [facility] Pager [phone]  If 7PM-7AM, please contact night-coverage [email or web address] Password  [facility] 04/26/2016, 6:22 PM

## 2016-04-26 NOTE — ED Notes (Signed)
Bed: WA03 Expected date:  Expected time:  Means of arrival:  Comments: EMS_fall

## 2016-04-26 NOTE — ED Triage Notes (Signed)
Per EMS pt from home, fall this Am from her mobile chair. denies loc nor head injury. No pain , nor injury yet pt requesting BP control. Hx HTN. Denies chest pain nor headache no further symptoms. Alert and oriented x 4. Pt has slurred speech  Normal to baseline.

## 2016-04-27 ENCOUNTER — Observation Stay (HOSPITAL_BASED_OUTPATIENT_CLINIC_OR_DEPARTMENT_OTHER): Payer: Medicare Other

## 2016-04-27 DIAGNOSIS — I499 Cardiac arrhythmia, unspecified: Secondary | ICD-10-CM | POA: Diagnosis present

## 2016-04-27 DIAGNOSIS — E785 Hyperlipidemia, unspecified: Secondary | ICD-10-CM | POA: Diagnosis present

## 2016-04-27 DIAGNOSIS — K219 Gastro-esophageal reflux disease without esophagitis: Secondary | ICD-10-CM | POA: Diagnosis present

## 2016-04-27 DIAGNOSIS — N179 Acute kidney failure, unspecified: Secondary | ICD-10-CM | POA: Diagnosis present

## 2016-04-27 DIAGNOSIS — Z79899 Other long term (current) drug therapy: Secondary | ICD-10-CM | POA: Diagnosis not present

## 2016-04-27 DIAGNOSIS — N390 Urinary tract infection, site not specified: Secondary | ICD-10-CM | POA: Diagnosis not present

## 2016-04-27 DIAGNOSIS — Z884 Allergy status to anesthetic agent status: Secondary | ICD-10-CM | POA: Diagnosis not present

## 2016-04-27 DIAGNOSIS — I10 Essential (primary) hypertension: Secondary | ICD-10-CM | POA: Diagnosis not present

## 2016-04-27 DIAGNOSIS — W050XXA Fall from non-moving wheelchair, initial encounter: Secondary | ICD-10-CM | POA: Diagnosis present

## 2016-04-27 DIAGNOSIS — Z885 Allergy status to narcotic agent status: Secondary | ICD-10-CM | POA: Diagnosis not present

## 2016-04-27 DIAGNOSIS — R55 Syncope and collapse: Secondary | ICD-10-CM | POA: Diagnosis not present

## 2016-04-27 DIAGNOSIS — Z880 Allergy status to penicillin: Secondary | ICD-10-CM | POA: Diagnosis not present

## 2016-04-27 DIAGNOSIS — Z833 Family history of diabetes mellitus: Secondary | ICD-10-CM | POA: Diagnosis not present

## 2016-04-27 DIAGNOSIS — Z7951 Long term (current) use of inhaled steroids: Secondary | ICD-10-CM | POA: Diagnosis not present

## 2016-04-27 DIAGNOSIS — Z23 Encounter for immunization: Secondary | ICD-10-CM | POA: Diagnosis not present

## 2016-04-27 DIAGNOSIS — Z882 Allergy status to sulfonamides status: Secondary | ICD-10-CM | POA: Diagnosis not present

## 2016-04-27 DIAGNOSIS — E039 Hypothyroidism, unspecified: Secondary | ICD-10-CM | POA: Diagnosis not present

## 2016-04-27 DIAGNOSIS — F329 Major depressive disorder, single episode, unspecified: Secondary | ICD-10-CM | POA: Diagnosis present

## 2016-04-27 DIAGNOSIS — Z7982 Long term (current) use of aspirin: Secondary | ICD-10-CM | POA: Diagnosis not present

## 2016-04-27 DIAGNOSIS — Z881 Allergy status to other antibiotic agents status: Secondary | ICD-10-CM | POA: Diagnosis not present

## 2016-04-27 DIAGNOSIS — Z8249 Family history of ischemic heart disease and other diseases of the circulatory system: Secondary | ICD-10-CM | POA: Diagnosis not present

## 2016-04-27 LAB — BASIC METABOLIC PANEL
ANION GAP: 9 (ref 5–15)
BUN: 22 mg/dL — ABNORMAL HIGH (ref 6–20)
CALCIUM: 9 mg/dL (ref 8.9–10.3)
CO2: 25 mmol/L (ref 22–32)
Chloride: 109 mmol/L (ref 101–111)
Creatinine, Ser: 1.26 mg/dL — ABNORMAL HIGH (ref 0.44–1.00)
GFR, EST AFRICAN AMERICAN: 47 mL/min — AB (ref >60)
GFR, EST NON AFRICAN AMERICAN: 40 mL/min — AB (ref >60)
Glucose, Bld: 93 mg/dL (ref 65–99)
POTASSIUM: 3.8 mmol/L (ref 3.5–5.1)
SODIUM: 143 mmol/L (ref 135–145)

## 2016-04-27 LAB — CBC
HCT: 31.9 % — ABNORMAL LOW (ref 36.0–46.0)
Hemoglobin: 10.1 g/dL — ABNORMAL LOW (ref 12.0–15.0)
MCH: 29.6 pg (ref 26.0–34.0)
MCHC: 31.7 g/dL (ref 30.0–36.0)
MCV: 93.5 fL (ref 78.0–100.0)
PLATELETS: 199 10*3/uL (ref 150–400)
RBC: 3.41 MIL/uL — AB (ref 3.87–5.11)
RDW: 15.5 % (ref 11.5–15.5)
WBC: 5.4 10*3/uL (ref 4.0–10.5)

## 2016-04-27 LAB — ECHOCARDIOGRAM COMPLETE
HEIGHTINCHES: 60 in
Weight: 3372.8 oz

## 2016-04-27 LAB — GLUCOSE, CAPILLARY: GLUCOSE-CAPILLARY: 85 mg/dL (ref 65–99)

## 2016-04-27 NOTE — Care Management Obs Status (Signed)
[place] NOTIFICATION   Patient Details  Name: Yeraldin Litzenberger MRN: [id number] Date of Birth: 09-15-1940   Medicare Observation Status Notification Given:  Yes    Erenest Rasher, RN 04/27/2016, 4:12 PM

## 2016-04-27 NOTE — Progress Notes (Signed)
*  PRELIMINARY RESULTS* Echocardiogram 2D Echocardiogram has been performed.  Tamara Stein 04/27/2016, 2:07 PM

## 2016-04-27 NOTE — Care Management Note (Addendum)
Case Management Note  Patient Details  Name: Tamara Stein MRN: [id number] Date of Birth: 06/13/1941  Subjective/Objective:    HTN, Cerebellar Degeneration,  Syncope, fall              Action/Plan: Discharge Planning:  NCM spoke to pt at bedside. States she lives in house off from her sister's home. States she uses her power scooter at home. She has managed at home with assistance from private caregivers that assist with cleaning, cooking, bathing and helping her get dressed. PT/OT recommending HH PT/OT. Pt reports having shower chair at home. Pt requesting a hospital bed at home. States she will have to be home before hospital bed is delivered. She does have someone that can move her bed once she gets home. Explained NCM will have weekday NCM order hospital bed from [facility]. Pt requesting [facility], had them in the past. Contacted [facility] and no return call. Contacted attending for DME hospital bed/hoyer lift and [place] orders, HHPT/OT, SW and aide. Pt states she ha s her own handicap Lucianne Lei that she pays someone to drive when needed.   Pt states she could not afford to pay for SNF-rehab or ALF out of pocket. Will have [facility] SW order to assist pt in making decision long term.   Awanda Mink MD   04/27/2016 1638 Spoke to [facility] DME/HH Liaison with new orders for [facility] and hospital bed.    Expected Discharge Date:    04/28/2016             Expected Discharge Plan:  [place]  In-House Referral:  NA  Discharge planning Services  CM Consult  Post Acute Care Choice:  Home Health Choice offered to:  Patient  DME Arranged:  Hospital bed, Other see comment DME Agency:  [facility]:  PT, OT, Nurse's Aide, Social Work [employer] Agency:  [place]  Status of Service:  In process, will continue to follow  If discussed at Long Length of Stay Meetings, dates discussed:    Additional Comments:  Erenest Rasher, RN 04/27/2016, 4:16 PM

## 2016-04-27 NOTE — Progress Notes (Deleted)
Physical Therapy Treatment Patient Details Name: Tamara Stein MRN: [id number] DOB: 01-Jul-1941 Today's Date: 04/27/2016    History of Present Illness 75 y.o. female with medical history significant of cerebellar degeneration,hypertension, hyperlipidemia, GERD, hypothyroidism, depression, anxiety, chronic back pain, slurred speech at baseline, who presents with fall, syncope, burning on urination    PT Comments    The patient desires to return home. Lift equipment and Hospital bed were ordered. No caregivers available to discuss current functional  Level. The caregivers  Have been assisting  Her PTA. She currently will require 24/7 assistance. Will check back and mobilize out of bed into chair. Continue PT while ina cute care.  Follow Up Recommendations   (HHPT -24/7 caregivers.      Equipment Recommendations  Hospital bed ,Hoyer lift   Recommendations for Other Services       Precautions / Restrictions Precautions Precautions: Fall Restrictions Weight Bearing Restrictions: No    Mobility  Bed Mobility Overal bed mobility: Needs Assistance Bed Mobility: Sit to Supine     Supine to sit: Min guard Sit to supine: Supervision   General bed mobility comments: able to lift legs onto bed  Transfers  Ambulation/Gait                 Stairs            Wheelchair Mobility    Modified Rankin (Stroke Patients Only)       Balance                               Cognition Arousal/Alertness: Awake/alert Behavior During Therapy: WFL for tasks assessed/performed Overall Cognitive Status: Within Functional Limits for tasks assessed                      Exercises      General Comments        Pertinent Vitals/Pain Pain Assessment: No/denies pain Faces Pain Scale: Hurts even more Pain Location:knees Pain Descriptors / Indicators: Aching Pain Intervention(s): Patient declines medication    Home Living Family/patient expects to be  discharged to:: Private residence Living Arrangements: Alone;Non-relatives/Friends Available Help at Discharge: Available PRN/intermittently;Personal care attendant;Family;Friend(s) Type of Home: House Home Access: Ramped entrance   Home Layout: One level Home Equipment: Tub bench;Grab bars - toilet;Grab bars - tub/shower;Wheelchair - power Additional Comments: would like a hospital bed    Prior Function Level of Independence: Needs assistance  Gait / Transfers Assistance Needed: non ambulatory, has a Printmaker with a lift, has a sliding board ADL's / Homemaking Assistance Needed: caregiver assists with shower, independent to dress. Comments: patient is independent with scoot transfers from [facility] to toilet and bed. has  rails at toilet   PT Goals (current goals can now be found in the care plan section) Acute Rehab PT Goals Patient Stated Goal: to go home PT Goal Formulation: With patient Time For Goal Achievement: 05/11/16 Potential to Achieve Goals: Good Progress towards PT goals: Progressing toward goals    Frequency    Min 3X/week      PT Plan Discharge plan needs to be updated    Co-evaluation             End of Session Equipment Utilized During Treatment: Other (comment) (sliding board) Activity Tolerance: Patient tolerated treatment well Patient left: in bed;with call bell/phone within reach;with bed alarm set     Time: 1250-1325 PT Time Calculation (min) (ACUTE ONLY): 35 min  Charges:  $Therapeutic Activity: 23-37 mins                    G Codes:    [name] 04/27/2016, 1:43 PM

## 2016-04-27 NOTE — Progress Notes (Signed)
Occupational Therapy Evaluation Patient Details Name: Tamara Stein MRN: [id number] DOB: 05-04-1941 Today's Date: 04/27/2016    History of Present Illness 75 y.o. female with medical history significant of cerebellar degeneration,hypertension, hyperlipidemia, GERD, hypothyroidism, depression, anxiety, chronic back pain, slurred speech at baseline, who presents with fall, syncope, burning on urination   Clinical Impression   Patient presents to OT with decreased ADL independence and safety due to the deficits listed below. She will benefit from skilled OT to maximize function and to facilitate a safe discharge. OT will follow.    Follow Up Recommendations  Home health OT;Supervision/Assistance - 24 hour    Equipment Recommendations  Hospital bed    Recommendations for Other Services       Precautions / Restrictions Precautions Precautions: Fall Restrictions Weight Bearing Restrictions: No      Mobility Bed Mobility            General bed mobility comments: NT -- OOB in recliner  Transfers              General transfer comment: pt declined; preferred to stay up in chair for a while longer    Balance                                         ADL Overall ADL's : Needs assistance/impaired Eating/Feeding: Moderate assistance;Sitting Eating/Feeding Details (indicate cue type and reason): reports nurses here have been helping with feeding but at home she does independently Grooming: Wash/dry hands;Wash/dry face;Set up;Sitting               Lower Body Dressing: Modified independent Lower Body Dressing Details (indicate cue type and reason): pt donned/doffed socks independently crossing foot over opposite leg during evaluation.               General ADL Comments: Patient received sitting up in recliner, reports she recently finished eating lunch. Patient able to don/doff socks by crossing legs over opposite knees. She focused on  wanting to be able to use sliding board for shower transfer. She can get her power w/c right up next to her shower bench. She reports her caregivers usually just assist her w/c<> shower chair. Patient also reports wanting exercises for her hands (sounds like mainly coordination exercises). We discussed using coins and playing cards and she said she had done that in the past. Will explore more next session.      Vision     Perception     Praxis      Pertinent Vitals/Pain Pain Assessment: No/denies pain      Hand Dominance Right   Extremity/Trunk Assessment Upper Extremity Assessment Upper Extremity Assessment: RUE deficits/detail;LUE deficits/detail RUE Deficits / Details: Ataxic, decreased coordination with tremors. Good ROM/strength for ADL LUE Deficits / Details: Ataxic, decreased coordination with tremors. Good ROM/strength for ADL   Lower Extremity Assessment Lower Extremity Assessment: Defer to PT evaluation RLE Deficits / Details: lacks 20 degerees of knee wxtension, grossly 2+ /5 LLE Deficits / Details: lacks 15 degrees kne extension   Cervical / Trunk Assessment Cervical / Trunk Assessment: Other exceptions Cervical / Trunk Exceptions: trunkal tirubations when sitting   Communication Communication Communication: Other (comment) (slurred speech -- baseline)   Cognition Arousal/Alertness: Awake/alert Behavior During Therapy: WFL for tasks assessed/performed Overall Cognitive Status: Within Functional Limits for tasks assessed  General Comments       Exercises       Shoulder Instructions      Home Living Family/patient expects to be discharged to:: Private residence Living Arrangements: Alone;Non-relatives/Friends Available Help at Discharge: Available PRN/intermittently;Personal care attendant;Family;Friend(s) Type of Home: House Home Access: Ramped entrance     Home Layout: One level     Bathroom Shower/Tub: [employer] Accessibility: Yes How Accessible: Accessible via wheelchair Home Equipment: Tub bench;Grab bars - toilet;Grab bars - tub/shower;Wheelchair - power   Additional Comments: would like a hospital bed      Prior Functioning/Environment Level of Independence: Needs assistance  Gait / Transfers Assistance Needed: non ambulatory, has a Printmaker with a lift, has a sliding board ADL's / Homemaking Assistance Needed: caregiver assists with shower, independent to dress.   Comments: patient is independent with scoot transfers from [facility] to toilet and bed. has  rails at toilet        OT Problem List: Decreased strength;Decreased activity tolerance;Impaired balance (sitting and/or standing);Decreased knowledge of use of DME or AE   OT Treatment/Interventions: Self-care/ADL training;Therapeutic exercise;DME and/or AE instruction;Therapeutic activities;Patient/family education    OT Goals(Current goals can be found in the care plan section) Acute Rehab OT Goals Patient Stated Goal: to go home OT Goal Formulation: With patient Time For Goal Achievement: 05/11/16 Potential to Achieve Goals: Good ADL Goals Pt Will Perform Upper Body Dressing: with set-up;sitting Pt Will Perform Lower Body Dressing: with supervision;sitting/lateral leans Pt Will Transfer to Toilet: regular height toilet;grab bars;with min assist Pt Will Perform Toileting - Clothing Manipulation and hygiene: with min assist;sitting/lateral leans Pt Will Perform Tub/Shower Transfer: Shower transfer;with min assist;with caregiver independent in assisting;with transfer board;tub bench Additional ADL Goal #1: Patient will be independent with B hand/UE coordination/strengthening exercises  OT Frequency: Min 2X/week   Barriers to D/C: Decreased caregiver support          Co-evaluation              End of Session    Activity Tolerance: Patient tolerated treatment well Patient left: in chair;with call bell/phone within  reach   Time: 1219-1232 OT Time Calculation (min): 13 min Charges:  OT General Charges $OT Visit: 1 Procedure OT Evaluation $OT Eval Low Complexity: 1 Procedure G-Codes: OT G-codes **NOT FOR INPATIENT CLASS** Functional Assessment Tool Used: clinical judgment Functional Limitation: Self care Self Care Current Status (F8101): At least 40 percent but less than 60 percent impaired, limited or restricted Self Care Goal Status (B5102): At least 1 percent but less than 20 percent impaired, limited or restricted  Kiela Shisler A 04/27/2016, 12:56 PM

## 2016-04-27 NOTE — Progress Notes (Signed)
Pt was sitting up in bed w/her meal when I arrived. She said she may get to go home tomorrow. She said she wanted prayer and after prayer thank me for coming. Please page if additional assistance is needed. [place], M.Div. [phone]

## 2016-04-27 NOTE — Evaluation (Addendum)
Physical Therapy Evaluation Patient Details Name: Tamara Stein MRN: [id number] DOB: October 12, 1940 Today's Date: 04/27/2016   History of Present Illness  75 y.o. female with medical history significant of cerebellar degeneration,hypertension, hyperlipidemia, GERD, hypothyroidism, depression, anxiety, chronic back pain, slurred speech at baseline, who presents with fall, syncope, burning on urination  Clinical Impression  The patient is nonambulatory, relies on scooting transfers from power chair to bed and to toilet.  Has rails around toilet. She has friends/caregivers 12/hours per day, noone at night. Recommended that she  Have assistance at night until back to her baseline. Pt admitted with above diagnosis. Pt currently with functional limitations due to the deficits listed below (see PT Problem List). Pt will benefit from skilled PT to increase their independence and safety with mobility to allow discharge to the venue listed below.       Follow Up Recommendations Home health PT;Supervision/Assistance - 24 hour    Equipment Recommendations  None recommended by PT    Recommendations for Other Services       Precautions / Restrictions Precautions Precautions: Fall      Mobility  Bed Mobility Overal bed mobility: Needs Assistance Bed Mobility: Supine to Sit     Supine to sit: Min guard     General bed mobility comments: able to get  herself to sitting  on bed edge  Transfers Overall transfer level: Needs assistance   Transfers: Lateral/Scoot Transfers          Lateral/Scoot Transfers: Max assist General transfer comment: drop arm and use of  bed pad to scoot into recliner.  Ambulation/Gait                Stairs            Wheelchair Mobility    Modified Rankin (Stroke Patients Only)       Balance Overall balance assessment: History of Falls;Needs assistance Sitting-balance support: Feet unsupported;Bilateral upper extremity supported Sitting  balance-Leahy Scale: Fair                                       Pertinent Vitals/Pain Pain Assessment: Faces Faces Pain Scale: Hurts even more Pain Location: right > left knees    Home Living Family/patient expects to be discharged to:: Private residence Living Arrangements: Alone;Non-relatives/Friends Available Help at Discharge: Available PRN/intermittently;Personal care attendant;Family;Friend(s) Type of Home: House Home Access: Ramped entrance     Home Layout: One level Home Equipment: Tub bench;Wheelchair - power      Prior Function Level of Independence: Needs assistance   Gait / Transfers Assistance Needed: non ambulatory, has a Printmaker with a lift, has a sliding board  ADL's / Homemaking Assistance Needed: caregiver assists with shower, independent to dress.  Comments: patient is independent with scoot transfers from [facility] to toilet and bed. has  rails at toilet     Hand Dominance   Dominant Hand: Right    Extremity/Trunk Assessment   Upper Extremity Assessment: Defer to OT evaluation           Lower Extremity Assessment: RLE deficits/detail;LLE deficits/detail RLE Deficits / Details: lacks 20 degerees of knee wxtension, grossly 2+ /5 LLE Deficits / Details: lacks 15 degrees kne extension  Cervical / Trunk Assessment: Other exceptions  Communication   Communication: Other (comment) (speech is slurred due to cerebellar degen)  Cognition Arousal/Alertness: Awake/alert Behavior During Therapy: WFL for tasks assessed/performed Overall Cognitive Status: Within  Functional Limits for tasks assessed                      General Comments      Exercises     Assessment/Plan    PT Assessment Patient needs continued PT services  PT Problem List Decreased strength;Decreased activity tolerance;Decreased balance;Decreased mobility;Decreased safety awareness;Decreased knowledge of precautions;Decreased knowledge of use of DME;Pain           PT Treatment Interventions DME instruction;Functional mobility training;Therapeutic activities;Patient/family education    PT Goals (Current goals can be found in the Care Plan section)  Acute Rehab PT Goals Patient Stated Goal: to go home PT Goal Formulation: With patient Time For Goal Achievement: 05/11/16 Potential to Achieve Goals: Good    Frequency Min 3X/week   Barriers to discharge Decreased caregiver support      Co-evaluation               End of Session Equipment Utilized During Treatment: Gait belt Activity Tolerance: Patient tolerated treatment well Patient left: in chair;with call bell/phone within reach;with chair alarm set Nurse Communication: Mobility status;Need for lift equipment    Functional Assessment Tool Used: clinical judgement Functional Limitation: Mobility: Walking and moving around Mobility: Walking and Moving Around Current Status [phone]): At least 60 percent but less than 80 percent impaired, limited or restricted Mobility: Walking and Moving Around Goal Status [phone]): At least 1 percent but less than 20 percent impaired, limited or restricted    Time: 0920-0955 PT Time Calculation (min) (ACUTE ONLY): 35 min   Charges:    Moderate evaluation, TA     PT G Codes:   PT G-Codes **NOT FOR INPATIENT CLASS** Functional Assessment Tool Used: clinical judgement Functional Limitation: Mobility: Walking and moving around Mobility: Walking and Moving Around Current Status (T6144): At least 60 percent but less than 80 percent impaired, limited or restricted Mobility: Walking and Moving Around Goal Status [phone]): At least 1 percent but less than 20 percent impaired, limited or restricted    [name] 04/27/2016, 10:24 AM Tresa Endo PT [phone]

## 2016-04-27 NOTE — Progress Notes (Signed)
PROGRESS NOTE  Tamara Stein ZOX:[id number] DOB: 03-07-1941 DOA: 04/26/2016 PCP: Claretta Fraise, MD  Hospital Course/Subjective: 75 y.o. female with medical history significant of hypertension, hyperlipidemia, GERD, hypothyroidism, depression, anxiety, chronic back pain, slurred speech at baseline, who presents with fall, syncope, burning on urination. Started on Rocephin for UTI and urine culture pending, had MRI brain without any change from prior in 2015. Was seen by PT who recommends supervision at home and HHPT. Echo and OT evaluation are pending at this time.  This AM she feels well, just got done working with PT. Feels like she's close to baseline.   Assessment/Plan: Syncope and fall: Etiology is not clear. No stroke, could still be cardiac arrhythmia or ischemia, orthostatic vs vasovagal syncope. Most likely related to infection/UTI. - cont tele bed for obs - MRI-brain neg - 2d echo pending - Neuro checks stable - continue ASA - OT eval and treat pending - plan for HHPT/OT  - fall precaution -IVF: 75 cc/h NS  ARF - likely due to low PO intake,  - cont IVF and recheck renal fxn in AM - hold home ACE today - if not improving would consider US renal in AM  UTI: Urinalysis is positive for nitrite. She has burning on urination, indicating possible UTI -IV Rocephin -Follow-up blood culture and urine culture  GERD: -Protonix  HTN: -continue lisinopril  HLD: Last LDL was 73 on 06/19/15  -Continue home medications: Lipitor  Depression: Stable, no suicidal or homicidal ideations. -Continue home medications: Cymbalta,  Hypothyroidism: Last TSH was 2.360 -Continue home Synthroid   DVT Prophylaxis: Lovenox  Code Status: FULL   Family Communication: None present this AM.   Disposition Plan: Likely home with HHPT/OT. Lives alone but sister/family live [place] the edge of same property.   Consultants:  None   Procedures:  MRI brain 9/16    Antimicrobials:  Rocephin 9/16 -->    Objective: Vitals:   04/26/16 1852 04/26/16 2017 04/27/16 0457 04/27/16 0500  BP: (!) 153/74 (!) 142/67 (!) 153/85   Pulse:  99 87   Resp:  18 20   Temp:  98.5 F (36.9 C) 98.2 F (36.8 C)   TempSrc:   Oral   SpO2:  98%    Weight:    95.6 kg (210 lb 12.8 oz)  Height:        Intake/Output Summary (Last 24 hours) at 04/27/16 1000 Last data filed at 04/26/16 2300  Gross per 24 hour  Intake            487.5 ml  Output                0 ml  Net            487.5 ml   Filed Weights   04/27/16 0500  Weight: 95.6 kg (210 lb 12.8 oz)     Exam: General:  Alert, oriented, calm, in no acute distress, slurred speech (baseline), up in chair after PT Neck: supple, no masses, trachea mildline  Cardiovascular: RRR, no murmurs or rubs, no peripheral edema  Respiratory: clear to auscultation bilaterally, no wheezes, no crackles  Abdomen: soft, nontender, nondistended, normal bowel tones heard, obese Skin: dry, no rashes  Musculoskeletal: no joint effusions, normal range of motion  Psychiatric: appropriate affect, normal speech  Neurologic: extraocular muscles intact, clear speech, moving all extremities with intact sensorium    Data Reviewed: CBC:  Recent Labs Lab 04/26/16 1116 04/27/16 0442  WBC 7.8 5.4  NEUTROABS 5.6  --  HGB 10.9* 10.1*  HCT 34.1* 31.9*  MCV 93.4 93.5  PLT 198 680   Basic Metabolic Panel:  Recent Labs Lab 04/26/16 1116 04/27/16 0442  NA 141 143  K 3.9 3.8  CL 107 109  CO2 29 25  GLUCOSE 101* 93  BUN 26* 22*  CREATININE 1.09* 1.26*  CALCIUM 9.7 9.0   GFR: Estimated Creatinine Clearance: 39.3 mL/min (by C-G formula based on SCr of 1.26 mg/dL (H)). Liver Function Tests: No results for input(s): AST, ALT, ALKPHOS, BILITOT, PROT, ALBUMIN in the last 168 hours. No results for input(s): LIPASE, AMYLASE in the last 168 hours. No results for input(s): AMMONIA in the last 168 hours. Coagulation  Profile: No results for input(s): INR, PROTIME in the last 168 hours. Cardiac Enzymes: No results for input(s): CKTOTAL, CKMB, CKMBINDEX, TROPONINI in the last 168 hours. BNP (last 3 results) No results for input(s): PROBNP in the last 8760 hours. HbA1C: No results for input(s): HGBA1C in the last 72 hours. CBG:  Recent Labs Lab 04/27/16 0750  GLUCAP 85   Lipid Profile: No results for input(s): CHOL, HDL, LDLCALC, TRIG, CHOLHDL, LDLDIRECT in the last 72 hours. Thyroid Function Tests: No results for input(s): TSH, T4TOTAL, FREET4, T3FREE, THYROIDAB in the last 72 hours. Anemia Panel: No results for input(s): VITAMINB12, FOLATE, FERRITIN, TIBC, IRON, RETICCTPCT in the last 72 hours. Urine analysis:    Component Value Date/Time   COLORURINE YELLOW 04/26/2016 1200   APPEARANCEUR CLEAR 04/26/2016 1200   APPEARANCEUR Clear 01/14/2016 1533   LABSPEC 1.015 04/26/2016 1200   PHURINE 5.5 04/26/2016 1200   GLUCOSEU NEGATIVE 04/26/2016 1200   HGBUR TRACE (A) 04/26/2016 1200   BILIRUBINUR NEGATIVE 04/26/2016 1200   BILIRUBINUR Negative 01/14/2016 1533   KETONESUR 15 (A) 04/26/2016 1200   PROTEINUR 30 (A) 04/26/2016 1200   UROBILINOGEN negative 09/06/2015 1155   UROBILINOGEN 0.2 03/25/2014 1359   NITRITE POSITIVE (A) 04/26/2016 1200   LEUKOCYTESUR NEGATIVE 04/26/2016 1200   LEUKOCYTESUR Negative 01/14/2016 1533   Sepsis Labs: @LABRCNTIP (procalcitonin:4,lacticidven:4)  )No results found for this or any previous visit (from the past 240 hour(s)).   Studies: Ct Head Wo Contrast  Result Date: 04/26/2016 CLINICAL DATA:  fall this Am from her mobile chair. denies loc nor head injury. No pain , nor injury yet pt requesting BP control. Hx HTN. Denies chest pain nor headache no further symptoms. Alert and oriented x 4. Pt has slurred speech Normal to baseline. EXAM: CT HEAD WITHOUT CONTRAST TECHNIQUE: Contiguous axial images were obtained from the base of the skull through the vertex  without intravenous contrast. COMPARISON:  Head CT 08/15/ 2015 in FINDINGS: Brain: No acute intracranial hemorrhage. No focal mass lesion. No CT evidence of acute infarction. No midline shift or mass effect. No hydrocephalus. Basilar cisterns are patent. There are periventricular and subcortical white matter hypodensities. Generalized cortical atrophy. Vascular: No hyperdense vessel or unexpected calcification. Skull: Normal. Negative for fracture or focal lesion. Sinuses/Orbits: No acute finding. Other: None IMPRESSION: 1. No acute intracranial findings. 2. 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 Electronically Signed   By: Suzy Bouchard M.D.   On: 04/26/2016 11:21   Mri Brain Without Contrast  Result Date: 04/26/2016 CLINICAL DATA:  75 year old female with syncope and fall. Initial encounter. EXAM: MRI HEAD WITHOUT CONTRAST TECHNIQUE: Multiplanar, multiecho pulse sequences of the brain and surrounding structures were obtained without intravenous contrast. COMPARISON:  Head CT without contrast 1104 hours today. Brain MRI 03/26/2014. FINDINGS: Brain: Chronic cerebellar volume loss. There  also appears to be a degree of chronic brainstem volume loss. Cerebral hemisphere volume is better preserved. No restricted diffusion to suggest acute infarction. No midline shift, mass effect, evidence of mass lesion, ventriculomegaly, extra-axial collection or acute intracranial hemorrhage. Cervicomedullary junction and pituitary are within normal limits. Patchy bilateral cerebral white matter T2 and FLAIR hyperintensity has not significantly changed since 2015. Patchy pontine T2 and FLAIR hyperintensity does appear mildly progressed. No cortical encephalomalacia or chronic cerebral blood products identified. Moderate T2 heterogeneity in the deep gray matter nuclei appears stable and largely related to perivascular spaces. Vascular: Major intracranial vascular flow voids are stable with generalized  intracranial artery dolichoectasia. Skull and upper cervical spine: Stable. Visualized bone marrow signal is within normal limits. Sinuses/Orbits: Stable orbits soft tissues. Paranasal sinuses are stable and clear. Trace left mastoid effusion is unchanged. Visible internal auditory structures appear normal. Other: Negative scalp soft tissues. IMPRESSION: 1.  No acute intracranial abnormality. 2. Stable noncontrast MRI appearance the brain since 2015 with advanced chronic cerebellar atrophy, and moderately advanced nonspecific signal changes in the cerebral white matter and brainstem which would most commonly reflect chronic small vessel disease. Electronically Signed   By: Genevie Ann M.D.   On: 04/26/2016 20:16    Scheduled Meds: . aspirin EC  81 mg Oral q morning - 10a  . atorvastatin  40 mg Oral QHS  . calcium-vitamin D  1 tablet Oral Q breakfast  . cefTRIAXone (ROCEPHIN)  IV  1 g Intravenous Q24H  . diclofenac  50 mg Oral TID  . DULoxetine  60 mg Oral Daily  . enoxaparin (LOVENOX) injection  40 mg Subcutaneous Q24H  . ferrous sulfate  325 mg Oral Q breakfast  . fluticasone  2 spray Each Nare Daily  . Influenza vac split quadrivalent PF  0.5 mL Intramuscular Tomorrow-1000  . levothyroxine  50 mcg Oral QAC breakfast  . lisinopril  20 mg Oral BID  . multivitamin with minerals  1 tablet Oral Daily  . pantoprazole  40 mg Oral Daily  . pneumococcal 23 valent vaccine  0.5 mL Intramuscular Tomorrow-1000  . raloxifene  60 mg Oral Daily  . sodium chloride flush  3 mL Intravenous Q12H    Continuous Infusions: . sodium chloride 75 mL/hr at 04/26/16 1630     LOS: 0 days   Time spent: 23 minutes  Mir Marry Guan, MD [facility] Pager [phone]  If 7PM-7AM, please contact night-coverage [email or web address] Password [email or web address] 04/27/2016, 10:00 AM

## 2016-04-28 LAB — BASIC METABOLIC PANEL
ANION GAP: 6 (ref 5–15)
BUN: 21 mg/dL — AB (ref 6–20)
CHLORIDE: 113 mmol/L — AB (ref 101–111)
CO2: 23 mmol/L (ref 22–32)
Calcium: 8.1 mg/dL — ABNORMAL LOW (ref 8.9–10.3)
Creatinine, Ser: 1.33 mg/dL — ABNORMAL HIGH (ref 0.44–1.00)
GFR calc Af Amer: 44 mL/min — ABNORMAL LOW (ref >60)
GFR, EST NON AFRICAN AMERICAN: 38 mL/min — AB (ref >60)
Glucose, Bld: 99 mg/dL (ref 65–99)
POTASSIUM: 4.3 mmol/L (ref 3.5–5.1)
SODIUM: 142 mmol/L (ref 135–145)

## 2016-04-28 LAB — GLUCOSE, CAPILLARY: Glucose-Capillary: 84 mg/dL (ref 65–99)

## 2016-04-28 MED ORDER — CEFIXIME 400 MG PO CAPS: 400.0000 mg | ORAL_CAPSULE | Freq: Every day | ORAL | 0 refills | Status: DC

## 2016-04-28 MED ORDER — CEFIXIME 400 MG PO CAPS
400.0000 mg | ORAL_CAPSULE | Freq: Every day | ORAL | Status: DC
  Administered 2016-04-28: 400 mg via ORAL
  Filled 2016-04-28: qty 1

## 2016-04-28 NOTE — Care Management Note (Signed)
Case Management Note  Patient Details  Name: Tamara Stein MRN: [id number] Date of Birth: 21-Mar-1941  Subjective/Objective:                    Action/Plan:   Expected Discharge Date:                  Expected Discharge Plan:  [place]  In-House Referral:  NA  Discharge planning Services  CM Consult  Post Acute Care Choice:  Home Health Choice offered to:  Patient  DME Arranged:  Hospital bed, Other see comment DME Agency:  [facility]:  PT, OT, Nurse's Aide, Social Work [employer] Agency:  [place]  Status of Service:  In process, will continue to follow  If discussed at Long Length of Stay Meetings, dates discussed:    Additional CommentsPurcell Mouton, RN 04/28/2016, 12:54 PM

## 2016-04-28 NOTE — Progress Notes (Addendum)
   04/28/16 1017 Late entry for 04/27/16 PM visit  PT Visit Information  Assistance Needed +2  History of Present Illness 75 y.o. female with medical history significant of cerebellar degeneration,hypertension, hyperlipidemia, GERD, hypothyroidism, depression, anxiety, chronic back pain, slurred speech at baseline, who presents with fall, syncope, burning on urination  Precautions  Precautions Fall  Pain Assessment  Faces Pain Scale 6  Cognition  Arousal/Alertness Awake/alert  Bed Mobility  Bed Mobility Sit to Supine  Sit to supine Supervision  General bed mobility comments able to lift legs onto bed  Transfers  Lateral/Scoot Transfers Max assist;+2 physical assistance;+2 safety/equipment;With slide board  General transfer comment Recliner parked beside the toilet and  left armrest dropped, the  patient pulled  on rails  on the wall and 2 person max assist to lateral scoot to toilet.  Then , 2  person max assist to scoot back into recliner. 2 person max assist to slide  on board with use of bed pad from the recliner onto the bed, cues for patient to place hands. Bed padto toilet  PT - End of Session  Equipment Utilized During Treatment Other (comment) (sliding board)  Activity Tolerance Patient tolerated treatment well  Patient left in bed;with call bell/phone within reach;with bed alarm set  Nurse Communication Patient requests pain meds;Mobility status;Need for lift equipment  PT - Assessment/Plan  PT Plan Discharge plan needs to be updated  PT Frequency (ACUTE ONLY) Min 3X/week  Follow Up Recommendations SNF (HHPT if not eleigible to go to reahb facility)  [facility] bed  [place] [facility] [phone]

## 2016-04-28 NOTE — Discharge Summary (Signed)
Physician Discharge Summary  Tamara Stein RAQ:[id number] DOB: 10-Jul-1941 DOA: 04/26/2016  PCP: Tamara Fraise, MD  Admit date: 04/26/2016 Discharge date: 04/28/2016  Admitted From: Home.  Disposition:  Home.  Recommendations for Outpatient Follow-up:  1. Follow up with PCP in 1-2 weeks 2. Please obtain BMP/CBC in one week 3. Please follow up on the urine culture results.   Home Health:yes Equipment/Devices: hospital bed. Hoyer lift.   Discharge Condition: improved CODE STATUS:full code.  Diet recommendation: Heart Healthy   Brief/Interim Summary: 75 y.o.femalewith medical history significant of hypertension, hyperlipidemia, GERD, hypothyroidism, depression, anxiety, chronic back pain, slurred speech at baseline, who presents with fall, syncope, burning on urination, was found to have UTI, growing gram negative rods.   Discharge Diagnoses:  Principal Problem:   Syncope Active Problems:   GERD (gastroesophageal reflux disease)   Hypertension   Hyperlipidemia   Depression   Hypothyroidism   UTI (lower urinary tract infection) Syncope and fall: Etiology is not clear. No stroke, could still be cardiac arrhythmia or ischemia, orthostatic vsvasovagal syncope. Most likely related to infection/UTI. - MRI-brain neg - 2d echo within normal limits.   ARF - likely due to low PO intake,  Baseline creatinine is around 1.4.  Improved.   [name] gram negative rods. Discharged on oral cefixime.   GERD: -Protonix  HTN: -continue lisinopril  [name] LDL was73 on 06/19/15 -Continue home medications: Lipitor  Depression:Stable, no suicidal or homicidal ideations. -Continue home medications: Cymbalta,  Hypothyroidism: Last TSH was 2.360 -Continue home Synthroid    Discharge Instructions  Discharge Instructions    Diet - low sodium heart healthy    Complete by:  As directed    Discharge instructions    Complete by:  As directed    Follow up with PCP in  one week and urine culture reports.       Medication List    TAKE these medications   aspirin EC 81 MG tablet Take 81 mg by mouth every morning.   atorvastatin 40 MG tablet Commonly known as:  LIPITOR Take 1 tablet by mouth at  bedtime   CALTRATE 600+D PO Take 1 tablet by mouth daily.   Cefixime 400 MG Caps capsule Commonly known as:  SUPRAX Take 1 capsule (400 mg total) by mouth daily. Start taking on:  04/29/2016   diclofenac 50 MG EC tablet Commonly known as:  VOLTAREN Take 1 tablet by mouth 3  times daily   DULoxetine 60 MG capsule Commonly known as:  CYMBALTA Take 1 capsule by mouth  daily   ferrous sulfate 325 (65 FE) MG tablet Take 325 mg by mouth daily with breakfast. Reported on 02/29/2016   fluticasone 50 MCG/ACT nasal spray Commonly known as:  FLONASE Place 2 sprays into both nostrils daily.   levothyroxine 50 MCG tablet Commonly known as:  SYNTHROID, LEVOTHROID Take 1 tablet by mouth  daily before breakfast   lisinopril 20 MG tablet Commonly known as:  PRINIVIL,ZESTRIL Take 1 tablet (20 mg total) by mouth 2 (two) times daily.   meclizine 25 MG tablet Commonly known as:  ANTIVERT Take 1 tablet (25 mg total) by mouth 3 (three) times daily as needed for dizziness.   multivitamin with minerals Tabs tablet Take 1 tablet by mouth daily.   omeprazole 40 MG capsule Commonly known as:  PRILOSEC Take 1 capsule by mouth  daily   raloxifene 60 MG tablet Commonly known as:  EVISTA Take 1 tablet by mouth  daily   traMADol 50 MG tablet Commonly  known as:  ULTRAM Take 1 tablet (50 mg total) by mouth 4 (four) times daily as needed for moderate pain.      Follow-up Information    [facility] .   Why:  Home Health Physical Therapy, Occupational Therapy, aide, Social Web designer information: [address] [place] [address] [place] .   Why:  please contact [facility] to schedule  delivery of hospital bed Contact information: [place] [address] [phone]        Stein,[name], MD. Schedule an appointment as soon as possible for a visit in 1 week(s).   Specialty:  Family Medicine Contact information: [address] [place] [address] [phone]          Allergies  Allergen Reactions  . Sulfa Antibiotics Anaphylaxis    Facial swelling   . Codeine Other (See Comments)    BLACK OUTS  . Erythromycin Swelling  . Ibuprofen Other (See Comments)    Stomach pain, muscle spasm  . Penicillins Diarrhea    Has patient had a PCN reaction causing immediate rash, facial/tongue/throat swelling, SOB or lightheadedness with hypotension: No Has patient had a PCN reaction causing severe rash involving mucus membranes or skin necrosis: No Has patient had a PCN reaction that required hospitalization Unknown Has patient had a PCN reaction occurring within the last 10 years: No If all of the above answers are "NO", then may proceed with Cephalosporin use.     Consultations:  none   Procedures/Studies: Dg Chest 2 View  Result Date: 04/17/2016 CLINICAL DATA:  Congestion.  Hypertension. EXAM: CHEST  2 VIEW COMPARISON:  January 21, 2015 FINDINGS: There is no edema or consolidation. Heart is upper normal in size with pulmonary vascularity within normal limits. There is atherosclerotic calcification in the aorta. No adenopathy. There is a large hiatal hernia. There is degenerative change in the mid thoracic spine with increase in kyphosis. There is carotid artery calcification on the right. There is degenerative change in each shoulder. IMPRESSION: No edema or consolidation. Sizable hiatal hernia. Aortic atherosclerosis. Calcification in right carotid artery. Electronically Signed   By: Lowella Grip III M.D.   On: 04/17/2016 15:46   Ct Head Wo Contrast  Result Date: 04/26/2016 CLINICAL DATA:  fall this Am from her mobile chair. denies loc nor  head injury. No pain , nor injury yet pt requesting BP control. Hx HTN. Denies chest pain nor headache no further symptoms. Alert and oriented x 4. Pt has slurred speech Normal to baseline. EXAM: CT HEAD WITHOUT CONTRAST TECHNIQUE: Contiguous axial images were obtained from the base of the skull through the vertex without intravenous contrast. COMPARISON:  Head CT 08/15/ 2015 in FINDINGS: Brain: No acute intracranial hemorrhage. No focal mass lesion. No CT evidence of acute infarction. No midline shift or mass effect. No hydrocephalus. Basilar cisterns are patent. There are periventricular and subcortical white matter hypodensities. Generalized cortical atrophy. Vascular: No hyperdense vessel or unexpected calcification. Skull: Normal. Negative for fracture or focal lesion. Sinuses/Orbits: No acute finding. Other: None IMPRESSION: 1. No acute intracranial findings. 2. 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 Electronically Signed   By: Suzy Bouchard M.D.   On: 04/26/2016 11:21   Mri Brain Without Contrast  Result Date: 04/26/2016 CLINICAL DATA:  75 year old female with syncope and fall. Initial encounter. EXAM: MRI HEAD WITHOUT CONTRAST TECHNIQUE: Multiplanar, multiecho pulse sequences of the brain  and surrounding structures were obtained without intravenous contrast. COMPARISON:  Head CT without contrast 1104 hours today. Brain MRI 03/26/2014. FINDINGS: Brain: Chronic cerebellar volume loss. There also appears to be a degree of chronic brainstem volume loss. Cerebral hemisphere volume is better preserved. No restricted diffusion to suggest acute infarction. No midline shift, mass effect, evidence of mass lesion, ventriculomegaly, extra-axial collection or acute intracranial hemorrhage. Cervicomedullary junction and pituitary are within normal limits. Patchy bilateral cerebral white matter T2 and FLAIR hyperintensity has not significantly changed since 2015. Patchy pontine T2 and FLAIR  hyperintensity does appear mildly progressed. No cortical encephalomalacia or chronic cerebral blood products identified. Moderate T2 heterogeneity in the deep gray matter nuclei appears stable and largely related to perivascular spaces. Vascular: Major intracranial vascular flow voids are stable with generalized intracranial artery dolichoectasia. Skull and upper cervical spine: Stable. Visualized bone marrow signal is within normal limits. Sinuses/Orbits: Stable orbits soft tissues. Paranasal sinuses are stable and clear. Trace left mastoid effusion is unchanged. Visible internal auditory structures appear normal. Other: Negative scalp soft tissues. IMPRESSION: 1.  No acute intracranial abnormality. 2. Stable noncontrast MRI appearance the brain since 2015 with advanced chronic cerebellar atrophy, and moderately advanced nonspecific signal changes in the cerebral white matter and brainstem which would most commonly reflect chronic small vessel disease. Electronically Signed   By: Genevie Ann M.D.   On: 04/26/2016 20:16    Echocardiogram.    Subjective: Denies any new complaints. Desperately wanted to go home.   Discharge Exam: Vitals:   04/28/16 0024 04/28/16 0500  BP: (!) 153/60 128/78  Pulse: 91 84  Resp: 20 20  Temp: 98.2 F (36.8 C) 97.3 F (36.3 C)   Vitals:   04/27/16 1509 04/27/16 2046 04/28/16 0024 04/28/16 0500  BP: (!) 142/70 114/61 (!) 153/60 128/78  Pulse: 82 92 91 84  Resp: 18 18 20 20   Temp: 98.6 F (37 C) 98.1 F (36.7 C) 98.2 F (36.8 C) 97.3 F (36.3 C)  TempSrc: Oral Oral Oral Oral  SpO2: 97% 98% 97% 100%  Weight:    94.9 kg (209 lb 4.8 oz)  Height:        General: Pt is alert, awake, not in acute distress Cardiovascular: RRR, S1/S2 +, no rubs, no gallops Respiratory: CTA bilaterally, no wheezing, no rhonchi Abdominal: Soft, NT, ND, bowel sounds +     The results of significant diagnostics from this hospitalization (including imaging, microbiology, ancillary  and laboratory) are listed below for reference.     Microbiology: Recent Results (from the past 240 hour(s))  Urine culture     Status: Abnormal (Preliminary result)   Collection Time: 04/26/16 12:00 PM  Result Value Ref Range Status   Specimen Description URINE, RANDOM  Final   Special Requests NONE  Final   Culture >=100,000 COLONIES/mL ESCHERICHIA COLI (A)  Final   Report Status PENDING  Incomplete     Labs: BNP (last 3 results) No results for input(s): BNP in the last 8760 hours. Basic Metabolic Panel:  Recent Labs Lab 04/26/16 1116 04/27/16 0442 04/28/16 0914  NA 141 143 142  K 3.9 3.8 4.3  CL 107 109 113*  CO2 29 25 23   GLUCOSE 101* 93 99  BUN 26* 22* 21*  CREATININE 1.09* 1.26* 1.33*  CALCIUM 9.7 9.0 8.1*   Liver Function Tests: No results for input(s): AST, ALT, ALKPHOS, BILITOT, PROT, ALBUMIN in the last 168 hours. No results for input(s): LIPASE, AMYLASE in the last 168 hours. No  results for input(s): AMMONIA in the last 168 hours. CBC:  Recent Labs Lab 04/26/16 1116 04/27/16 0442  WBC 7.8 5.4  NEUTROABS 5.6  --   HGB 10.9* 10.1*  HCT 34.1* 31.9*  MCV 93.4 93.5  PLT 198 199   Cardiac Enzymes: No results for input(s): CKTOTAL, CKMB, CKMBINDEX, TROPONINI in the last 168 hours. BNP: Invalid input(s): POCBNP CBG:  Recent Labs Lab 04/27/16 0750 04/28/16 0648  GLUCAP 85 84   D-Dimer No results for input(s): DDIMER in the last 72 hours. Hgb A1c No results for input(s): HGBA1C in the last 72 hours. Lipid Profile No results for input(s): CHOL, HDL, LDLCALC, TRIG, CHOLHDL, LDLDIRECT in the last 72 hours. Thyroid function studies No results for input(s): TSH, T4TOTAL, T3FREE, THYROIDAB in the last 72 hours.  Invalid input(s): FREET3 Anemia work up No results for input(s): VITAMINB12, FOLATE, FERRITIN, TIBC, IRON, RETICCTPCT in the last 72 hours. Urinalysis    Component Value Date/Time   COLORURINE YELLOW 04/26/2016 1200   APPEARANCEUR CLEAR  04/26/2016 1200   APPEARANCEUR Clear 01/14/2016 1533   LABSPEC 1.015 04/26/2016 1200   PHURINE 5.5 04/26/2016 1200   GLUCOSEU NEGATIVE 04/26/2016 1200   HGBUR TRACE (A) 04/26/2016 1200   BILIRUBINUR NEGATIVE 04/26/2016 1200   BILIRUBINUR Negative 01/14/2016 1533   KETONESUR 15 (A) 04/26/2016 1200   PROTEINUR 30 (A) 04/26/2016 1200   UROBILINOGEN negative 09/06/2015 1155   UROBILINOGEN 0.2 03/25/2014 1359   NITRITE POSITIVE (A) 04/26/2016 1200   LEUKOCYTESUR NEGATIVE 04/26/2016 1200   LEUKOCYTESUR Negative 01/14/2016 1533   Sepsis Labs Invalid input(s): PROCALCITONIN,  WBC,  LACTICIDVEN Microbiology Recent Results (from the past 240 hour(s))  Urine culture     Status: Abnormal (Preliminary result)   Collection Time: 04/26/16 12:00 PM  Result Value Ref Range Status   Specimen Description URINE, RANDOM  Final   Special Requests NONE  Final   Culture >=100,000 COLONIES/mL ESCHERICHIA COLI (A)  Final   Report Status PENDING  Incomplete     Time coordinating discharge: Over 30 minutes  SIGNED:   Hosie Poisson, MD  [facility] 04/28/2016, 2:17 PM Pager [phone]  If 7PM-7AM, please contact night-coverage [email or web address] Password [email or web address]

## 2016-04-28 NOTE — Progress Notes (Signed)
Physical Therapy Treatment Patient Details Name: Johnathan Tortorelli MRN: [id number] DOB: Sep 11, 1940 Today's Date: 04/28/2016    History of Present Illness 75 y.o. female with medical history significant of cerebellar degeneration,hypertension, hyperlipidemia, GERD, hypothyroidism, depression, anxiety, chronic back pain, slurred speech at baseline, who presents with fall, syncope, burning on urination    PT Comments    The patient reports desire to return home. Will need nonemergency EMS. She does have caregivers. Pivoting/sliding  was very difficult during previous visit So having a Hoyer lift may be  Necessary.   Continue PT while in acute care,  Follow Up Recommendations  Home health PT;Supervision/Assistance - 24 hour     Equipment Recommendations  Hospital bed [facility])    Recommendations for Other Services       Precautions / Restrictions Precautions Precautions: Fall Precaution Comments: ataxia in all extermities and trunk    Mobility  Bed Mobility Overal bed mobility: Needs Assistance Bed Mobility: Rolling Rolling: Min assist     Sit to supine: Supervision   General bed mobility comments: able to roll with min assist for bedpan. Did not get up due to breakfast here.  Transfers                Lateral/Scoot Transfers: Max assist;+2 physical assistance;+2 safety/equipment;With slide board General transfer comment: Recliner parked beside the toilet and  left armrest dropped, the  patient pulled  on rails  on the wall and 2 person max assist to lateral scoot to toilet.  Then , 2  person max assist to scoot back into recliner. 2 person max assist to slide  on board with use of bed pad from the recliner onto the bed, cues for patient to place hands. Bed padto toilet  Ambulation/Gait                 Stairs            Wheelchair Mobility    Modified Rankin (Stroke Patients Only)       Balance                                     Cognition Arousal/Alertness: Awake/alert                          Exercises      General Comments        Pertinent Vitals/Pain Pain Assessment: Faces Faces Pain Scale: Hurts little more Pain Location: knees Pain Descriptors / Indicators: Aching Pain Intervention(s): Monitored during session    Home Living                      Prior Function            PT Goals (current goals can now be found in the care plan section) Progress towards PT goals: Progressing toward goals    Frequency    Min 3X/week      PT Plan Current plan remains appropriate    Co-evaluation             End of Session Equipment Utilized During Treatment: Other (comment) (sliding board) Activity Tolerance: Patient tolerated treatment well Patient left: in bed;with call bell/phone within reach;with bed alarm set     Time: 0820-0855 PT Time Calculation (min) (ACUTE ONLY): 35 min  Charges:  $Therapeutic Activity: 23-37 mins  G Codes:      [name] 04/28/2016, 1:44 PM

## 2016-04-29 LAB — URINE CULTURE: Culture: >=100000 — AB

## 2016-04-30 DIAGNOSIS — W19XXXD Unspecified fall, subsequent encounter: Secondary | ICD-10-CM | POA: Diagnosis not present

## 2016-04-30 DIAGNOSIS — E039 Hypothyroidism, unspecified: Secondary | ICD-10-CM | POA: Diagnosis not present

## 2016-04-30 DIAGNOSIS — K219 Gastro-esophageal reflux disease without esophagitis: Secondary | ICD-10-CM | POA: Diagnosis not present

## 2016-04-30 DIAGNOSIS — E785 Hyperlipidemia, unspecified: Secondary | ICD-10-CM | POA: Diagnosis not present

## 2016-04-30 DIAGNOSIS — I10 Essential (primary) hypertension: Secondary | ICD-10-CM | POA: Diagnosis not present

## 2016-04-30 DIAGNOSIS — E669 Obesity, unspecified: Secondary | ICD-10-CM | POA: Diagnosis not present

## 2016-04-30 DIAGNOSIS — M549 Dorsalgia, unspecified: Secondary | ICD-10-CM | POA: Diagnosis not present

## 2016-04-30 DIAGNOSIS — N390 Urinary tract infection, site not specified: Secondary | ICD-10-CM | POA: Diagnosis not present

## 2016-04-30 DIAGNOSIS — A4150 Gram-negative sepsis, unspecified: Secondary | ICD-10-CM | POA: Diagnosis not present

## 2016-04-30 DIAGNOSIS — F341 Dysthymic disorder: Secondary | ICD-10-CM | POA: Diagnosis not present

## 2016-04-30 DIAGNOSIS — G894 Chronic pain syndrome: Secondary | ICD-10-CM | POA: Diagnosis not present

## 2016-04-30 DIAGNOSIS — Z6838 Body mass index (BMI) 38.0-38.9, adult: Secondary | ICD-10-CM | POA: Diagnosis not present

## 2016-05-02 DIAGNOSIS — E785 Hyperlipidemia, unspecified: Secondary | ICD-10-CM | POA: Diagnosis not present

## 2016-05-02 DIAGNOSIS — A4150 Gram-negative sepsis, unspecified: Secondary | ICD-10-CM | POA: Diagnosis not present

## 2016-05-02 DIAGNOSIS — M549 Dorsalgia, unspecified: Secondary | ICD-10-CM | POA: Diagnosis not present

## 2016-05-02 DIAGNOSIS — I10 Essential (primary) hypertension: Secondary | ICD-10-CM | POA: Diagnosis not present

## 2016-05-02 DIAGNOSIS — G894 Chronic pain syndrome: Secondary | ICD-10-CM | POA: Diagnosis not present

## 2016-05-02 DIAGNOSIS — N390 Urinary tract infection, site not specified: Secondary | ICD-10-CM | POA: Diagnosis not present

## 2016-05-04 DIAGNOSIS — R69 Illness, unspecified: Secondary | ICD-10-CM | POA: Diagnosis not present

## 2016-05-05 DIAGNOSIS — G894 Chronic pain syndrome: Secondary | ICD-10-CM | POA: Diagnosis not present

## 2016-05-05 DIAGNOSIS — I10 Essential (primary) hypertension: Secondary | ICD-10-CM | POA: Diagnosis not present

## 2016-05-05 DIAGNOSIS — A4150 Gram-negative sepsis, unspecified: Secondary | ICD-10-CM | POA: Diagnosis not present

## 2016-05-05 DIAGNOSIS — E785 Hyperlipidemia, unspecified: Secondary | ICD-10-CM | POA: Diagnosis not present

## 2016-05-05 DIAGNOSIS — M549 Dorsalgia, unspecified: Secondary | ICD-10-CM | POA: Diagnosis not present

## 2016-05-05 DIAGNOSIS — N390 Urinary tract infection, site not specified: Secondary | ICD-10-CM | POA: Diagnosis not present

## 2016-05-06 DIAGNOSIS — E785 Hyperlipidemia, unspecified: Secondary | ICD-10-CM | POA: Diagnosis not present

## 2016-05-06 DIAGNOSIS — M549 Dorsalgia, unspecified: Secondary | ICD-10-CM | POA: Diagnosis not present

## 2016-05-06 DIAGNOSIS — A4150 Gram-negative sepsis, unspecified: Secondary | ICD-10-CM | POA: Diagnosis not present

## 2016-05-06 DIAGNOSIS — N390 Urinary tract infection, site not specified: Secondary | ICD-10-CM | POA: Diagnosis not present

## 2016-05-06 DIAGNOSIS — I10 Essential (primary) hypertension: Secondary | ICD-10-CM | POA: Diagnosis not present

## 2016-05-06 DIAGNOSIS — G894 Chronic pain syndrome: Secondary | ICD-10-CM | POA: Diagnosis not present

## 2016-05-07 ENCOUNTER — Other Ambulatory Visit: Payer: Self-pay | Admitting: Family Medicine

## 2016-05-07 DIAGNOSIS — M549 Dorsalgia, unspecified: Secondary | ICD-10-CM | POA: Diagnosis not present

## 2016-05-07 DIAGNOSIS — I10 Essential (primary) hypertension: Secondary | ICD-10-CM | POA: Diagnosis not present

## 2016-05-07 DIAGNOSIS — E785 Hyperlipidemia, unspecified: Secondary | ICD-10-CM | POA: Diagnosis not present

## 2016-05-07 DIAGNOSIS — N390 Urinary tract infection, site not specified: Secondary | ICD-10-CM | POA: Diagnosis not present

## 2016-05-07 DIAGNOSIS — G894 Chronic pain syndrome: Secondary | ICD-10-CM | POA: Diagnosis not present

## 2016-05-07 DIAGNOSIS — A4150 Gram-negative sepsis, unspecified: Secondary | ICD-10-CM | POA: Diagnosis not present

## 2016-05-08 ENCOUNTER — Other Ambulatory Visit: Payer: Self-pay | Admitting: Family Medicine

## 2016-05-08 DIAGNOSIS — I10 Essential (primary) hypertension: Secondary | ICD-10-CM | POA: Diagnosis not present

## 2016-05-08 DIAGNOSIS — G894 Chronic pain syndrome: Secondary | ICD-10-CM | POA: Diagnosis not present

## 2016-05-08 DIAGNOSIS — E785 Hyperlipidemia, unspecified: Secondary | ICD-10-CM | POA: Diagnosis not present

## 2016-05-08 DIAGNOSIS — M549 Dorsalgia, unspecified: Secondary | ICD-10-CM | POA: Diagnosis not present

## 2016-05-08 DIAGNOSIS — A4150 Gram-negative sepsis, unspecified: Secondary | ICD-10-CM | POA: Diagnosis not present

## 2016-05-08 DIAGNOSIS — N390 Urinary tract infection, site not specified: Secondary | ICD-10-CM | POA: Diagnosis not present

## 2016-05-09 ENCOUNTER — Ambulatory Visit (INDEPENDENT_AMBULATORY_CARE_PROVIDER_SITE_OTHER): Payer: Medicare Other | Admitting: Family Medicine

## 2016-05-09 ENCOUNTER — Ambulatory Visit: Payer: Medicare Other | Admitting: Family Medicine

## 2016-05-09 ENCOUNTER — Encounter: Payer: Self-pay | Admitting: Family Medicine

## 2016-05-09 VITALS — BP 157/91 | HR 98 | Temp 97.7°F | Ht 60.0 in

## 2016-05-09 DIAGNOSIS — R1084 Generalized abdominal pain: Secondary | ICD-10-CM | POA: Diagnosis not present

## 2016-05-09 DIAGNOSIS — G319 Degenerative disease of nervous system, unspecified: Secondary | ICD-10-CM

## 2016-05-09 DIAGNOSIS — E039 Hypothyroidism, unspecified: Secondary | ICD-10-CM

## 2016-05-09 DIAGNOSIS — K449 Diaphragmatic hernia without obstruction or gangrene: Secondary | ICD-10-CM | POA: Diagnosis not present

## 2016-05-09 DIAGNOSIS — I10 Essential (primary) hypertension: Secondary | ICD-10-CM | POA: Diagnosis not present

## 2016-05-09 DIAGNOSIS — R55 Syncope and collapse: Secondary | ICD-10-CM

## 2016-05-09 DIAGNOSIS — N390 Urinary tract infection, site not specified: Secondary | ICD-10-CM | POA: Diagnosis not present

## 2016-05-09 DIAGNOSIS — R7309 Other abnormal glucose: Secondary | ICD-10-CM | POA: Diagnosis not present

## 2016-05-09 LAB — URINALYSIS, COMPLETE
BILIRUBIN UA: NEGATIVE
Glucose, UA: NEGATIVE
LEUKOCYTES UA: NEGATIVE
Nitrite, UA: NEGATIVE
PH UA: 5 (ref 5.0–7.5)
RBC UA: NEGATIVE
Specific Gravity, UA: >1.03 — ABNORMAL HIGH (ref 1.005–1.030)
Urobilinogen, Ur: 0.2 mg/dL (ref 0.2–1.0)

## 2016-05-09 LAB — MICROSCOPIC EXAMINATION

## 2016-05-09 MED ORDER — DEXLANSOPRAZOLE 60 MG PO CPDR: 60.0000 mg | DELAYED_RELEASE_CAPSULE | Freq: Every day | ORAL | 5 refills | Status: DC

## 2016-05-09 MED ORDER — AMLODIPINE BESYLATE 5 MG PO TABS: 5.0000 mg | ORAL_TABLET | Freq: Every day | ORAL | 5 refills | Status: DC

## 2016-05-09 NOTE — Progress Notes (Signed)
Subjective:  Patient ID: Tamara Stein, female    DOB: 03/25/1941  Age: 75 y.o. MRN: [id number]  CC: Hospitalization Follow-up (pt here today following up from hospital after syncope, and fall, also had a UTI)   HPI Tamara Stein presents for Follow-up from passing out after she fell. She has cerebellar degeneration and stays off balance. She is concerned today about her blood pressure being high. She is concerned that her vision is getting worse. She also is having some abdominal discomfort and increased borborygmi. She would like to be checked for H. pylori. Additionally she had a urinary tract infection while in the hospital and some increase in her creatinine. She denies any dysuria currently. She denies edema that would be coming from renal insufficiency currently she tries to drink more water. Not sure exactly how much this is. History Tamara Stein has a past medical history of Allergy; Anxiety; Cataract; Cerebellar degeneration; Chronic kidney disease; Chronic knee pain; Depression; GERD (gastroesophageal reflux disease); Hyperlipidemia; Hypertension; and Thyroid disease.   She has a past surgical history that includes Cholecystectomy; Abdominal hysterectomy; and RIGHT ELBOW.   Her family history includes Arthritis in her sister; Cancer in her brother; Diabetes in her brother; Heart disease in her brother; Hyperlipidemia in her sister; Hypertension in her sister.She reports that she has never smoked. She has never used smokeless tobacco. She reports that she does not drink alcohol or use drugs.    ROS Review of Systems  Constitutional: Negative for activity change, appetite change and fever.  HENT: Negative for congestion, rhinorrhea and sore throat.   Eyes: Negative for visual disturbance.  Respiratory: Negative for cough and shortness of breath.   Cardiovascular: Negative for chest pain and palpitations.  Gastrointestinal: Positive for constipation (constipation and diarrhea  alternate) and diarrhea. Negative for abdominal pain and nausea.  Genitourinary: Negative for dysuria.  Musculoskeletal: Positive for gait problem. Negative for arthralgias and myalgias.  Neurological: Positive for dizziness, syncope and light-headedness. Negative for seizures.    Objective:  BP (!) 170/92   Pulse 98   Temp 97.7 F (36.5 C) (Oral)   Ht 5' (1.524 m)   BP Readings from Last 3 Encounters:  05/09/16 (!) 170/92  04/28/16 128/78  04/17/16 (!) 151/92    Wt Readings from Last 3 Encounters:  04/28/16 209 lb 4.8 oz (94.9 kg)  01/21/15 215 lb (97.5 kg)  03/27/14 210 lb 8.6 oz (95.5 kg)     Physical Exam  Constitutional: She is oriented to person, place, and time. She appears well-developed and well-nourished. No distress.  HENT:  Head: Normocephalic and atraumatic.  Right Ear: External ear normal.  Left Ear: External ear normal.  Nose: Nose normal.  Mouth/Throat: Oropharynx is clear and moist.  Eyes: Conjunctivae and EOM are normal. Pupils are equal, round, and reactive to light.  Neck: Normal range of motion. Neck supple. No thyromegaly present.  Cardiovascular: Normal rate, regular rhythm and normal heart sounds.   No murmur heard. Pulmonary/Chest: Effort normal and breath sounds normal. No respiratory distress. She has no wheezes. She has no rales.  Abdominal: Soft. Bowel sounds are normal. She exhibits distension. She exhibits no mass. There is no tenderness. There is no rebound and no guarding.  Musculoskeletal: She exhibits no edema or tenderness.  Wheelchair-bound. Has electric device  Lymphadenopathy:    She has no cervical adenopathy.  Neurological: She is alert and oriented to person, place, and time. She has normal reflexes.  Skin: Skin is warm and dry.  Psychiatric:  She has a normal mood and affect. Her behavior is normal.     Lab Results  Component Value Date   WBC 5.4 04/27/2016   HGB 10.1 (L) 04/27/2016   HCT 31.9 (L) 04/27/2016   PLT 199  04/27/2016   GLUCOSE 99 04/28/2016   CHOL 138 06/19/2015   TRIG 106 06/19/2015   HDL 44 06/19/2015   LDLCALC 73 06/19/2015   ALT 12 02/29/2016   AST 20 02/29/2016   NA 142 04/28/2016   K 4.3 04/28/2016   CL 113 (H) 04/28/2016   CREATININE 1.33 (H) 04/28/2016   BUN 21 (H) 04/28/2016   CO2 23 04/28/2016   TSH 2.360 02/29/2016   HGBA1C 5.9 (H) 03/25/2014    Ct Head Wo Contrast  Result Date: 04/26/2016 CLINICAL DATA:  fall this Am from her mobile chair. denies loc nor head injury. No pain , nor injury yet pt requesting BP control. Hx HTN. Denies chest pain nor headache no further symptoms. Alert and oriented x 4. Pt has slurred speech Normal to baseline. EXAM: CT HEAD WITHOUT CONTRAST TECHNIQUE: Contiguous axial images were obtained from the base of the skull through the vertex without intravenous contrast. COMPARISON:  Head CT 08/15/ 2015 in FINDINGS: Brain: No acute intracranial hemorrhage. No focal mass lesion. No CT evidence of acute infarction. No midline shift or mass effect. No hydrocephalus. Basilar cisterns are patent. There are periventricular and subcortical white matter hypodensities. Generalized cortical atrophy. Vascular: No hyperdense vessel or unexpected calcification. Skull: Normal. Negative for fracture or focal lesion. Sinuses/Orbits: No acute finding. Other: None IMPRESSION: 1. No acute intracranial findings. 2. 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 Electronically Signed   By: Suzy Bouchard M.D.   On: 04/26/2016 11:21   Mri Brain Without Contrast  Result Date: 04/26/2016 CLINICAL DATA:  75 year old female with syncope and fall. Initial encounter. EXAM: MRI HEAD WITHOUT CONTRAST TECHNIQUE: Multiplanar, multiecho pulse sequences of the brain and surrounding structures were obtained without intravenous contrast. COMPARISON:  Head CT without contrast 1104 hours today. Brain MRI 03/26/2014. FINDINGS: Brain: Chronic cerebellar volume loss. There also appears to  be a degree of chronic brainstem volume loss. Cerebral hemisphere volume is better preserved. No restricted diffusion to suggest acute infarction. No midline shift, mass effect, evidence of mass lesion, ventriculomegaly, extra-axial collection or acute intracranial hemorrhage. Cervicomedullary junction and pituitary are within normal limits. Patchy bilateral cerebral white matter T2 and FLAIR hyperintensity has not significantly changed since 2015. Patchy pontine T2 and FLAIR hyperintensity does appear mildly progressed. No cortical encephalomalacia or chronic cerebral blood products identified. Moderate T2 heterogeneity in the deep gray matter nuclei appears stable and largely related to perivascular spaces. Vascular: Major intracranial vascular flow voids are stable with generalized intracranial artery dolichoectasia. Skull and upper cervical spine: Stable. Visualized bone marrow signal is within normal limits. Sinuses/Orbits: Stable orbits soft tissues. Paranasal sinuses are stable and clear. Trace left mastoid effusion is unchanged. Visible internal auditory structures appear normal. Other: Negative scalp soft tissues. IMPRESSION: 1.  No acute intracranial abnormality. 2. Stable noncontrast MRI appearance the brain since 2015 with advanced chronic cerebellar atrophy, and moderately advanced nonspecific signal changes in the cerebral white matter and brainstem which would most commonly reflect chronic small vessel disease. Electronically Signed   By: Genevie Ann M.D.   On: 04/26/2016 20:16    Assessment & Plan:   Lorrine was seen today for hospitalization follow-up.  Diagnoses and all orders for this visit:  Cerebellar degeneration -  CMP14+EGFR  Essential hypertension -     CMP14+EGFR  Hypothyroidism, unspecified hypothyroidism type -     CMP14+EGFR  Syncope, unspecified syncope type -     CBC with Differential/Platelet -     CMP14+EGFR  UTI (lower urinary tract infection) -     Urine  culture -     Urinalysis, Complete -     CBC with Differential/Platelet -     CMP14+EGFR  Generalized abdominal pain -     H. pylori antigen, stool; Future  Hiatal hernia -     H. pylori antigen, stool; Future  Other orders -     dexlansoprazole (DEXILANT) 60 MG capsule; Take 1 capsule (60 mg total) by mouth daily. On an empty stomach, then do not eat for 1 hour -     amLODipine (NORVASC) 5 MG tablet; Take 1 tablet (5 mg total) by mouth daily. For blood pressure      I have discontinued Ms. Villari's omeprazole and Cefixime. I am also having her start on dexlansoprazole and amLODipine. Additionally, I am having her maintain her aspirin EC, multivitamin with minerals, Calcium Carbonate-Vitamin D (CALTRATE 600+D PO), ferrous sulfate, meclizine, levothyroxine, raloxifene, atorvastatin, traMADol, fluticasone, lisinopril, diclofenac, and DULoxetine.  Meds ordered this encounter  Medications  . dexlansoprazole (DEXILANT) 60 MG capsule    Sig: Take 1 capsule (60 mg total) by mouth daily. On an empty stomach, then do not eat for 1 hour    Dispense:  30 capsule    Refill:  5  . amLODipine (NORVASC) 5 MG tablet    Sig: Take 1 tablet (5 mg total) by mouth daily. For blood pressure    Dispense:  30 tablet    Refill:  5     Follow-up: Return in about 1 month (around 06/08/2016).  Tamara Stein, M.D.

## 2016-05-10 LAB — CBC WITH DIFFERENTIAL/PLATELET
BASOS: 1 %
Basophils Absolute: 0.1 10*3/uL (ref 0.0–0.2)
EOS (ABSOLUTE): 0.4 10*3/uL (ref 0.0–0.4)
EOS: 7 %
HEMATOCRIT: 34.1 % (ref 34.0–46.6)
HEMOGLOBIN: 10.5 g/dL — AB (ref 11.1–15.9)
IMMATURE GRANS (ABS): 0 10*3/uL (ref 0.0–0.1)
IMMATURE GRANULOCYTES: 0 %
LYMPHS: 31 %
Lymphocytes Absolute: 1.7 10*3/uL (ref 0.7–3.1)
MCH: 28.9 pg (ref 26.6–33.0)
MCHC: 30.8 g/dL — ABNORMAL LOW (ref 31.5–35.7)
MCV: 94 fL (ref 79–97)
MONOCYTES: 13 %
Monocytes Absolute: 0.7 10*3/uL (ref 0.1–0.9)
NEUTROS PCT: 48 %
Neutrophils Absolute: 2.7 10*3/uL (ref 1.4–7.0)
PLATELETS: 234 10*3/uL (ref 150–379)
RBC: 3.63 x10E6/uL — ABNORMAL LOW (ref 3.77–5.28)
RDW: 15.8 % — ABNORMAL HIGH (ref 12.3–15.4)
WBC: 5.6 10*3/uL (ref 3.4–10.8)

## 2016-05-10 LAB — CMP14+EGFR
A/G RATIO: 1.6 (ref 1.2–2.2)
ALBUMIN: 3.9 g/dL (ref 3.5–4.8)
ALT: 21 IU/L (ref 0–32)
AST: 23 IU/L (ref 0–40)
Alkaline Phosphatase: 63 IU/L (ref 39–117)
BUN/Creatinine Ratio: 20 (ref 12–28)
BUN: 26 mg/dL (ref 8–27)
Bilirubin Total: 0.2 mg/dL (ref 0.0–1.2)
CALCIUM: 9.2 mg/dL (ref 8.7–10.3)
CO2: 26 mmol/L (ref 18–29)
Chloride: 106 mmol/L (ref 96–106)
Creatinine, Ser: 1.33 mg/dL — ABNORMAL HIGH (ref 0.57–1.00)
GFR, EST AFRICAN AMERICAN: 45 mL/min/{1.73_m2} — AB (ref >59)
GFR, EST NON AFRICAN AMERICAN: 39 mL/min/{1.73_m2} — AB (ref >59)
Globulin, Total: 2.4 g/dL (ref 1.5–4.5)
Glucose: 154 mg/dL — ABNORMAL HIGH (ref 65–99)
POTASSIUM: 3.7 mmol/L (ref 3.5–5.2)
Sodium: 147 mmol/L — ABNORMAL HIGH (ref 134–144)
TOTAL PROTEIN: 6.3 g/dL (ref 6.0–8.5)

## 2016-05-11 LAB — URINE CULTURE

## 2016-05-12 DIAGNOSIS — I10 Essential (primary) hypertension: Secondary | ICD-10-CM | POA: Diagnosis not present

## 2016-05-12 DIAGNOSIS — G894 Chronic pain syndrome: Secondary | ICD-10-CM | POA: Diagnosis not present

## 2016-05-12 DIAGNOSIS — A4150 Gram-negative sepsis, unspecified: Secondary | ICD-10-CM | POA: Diagnosis not present

## 2016-05-12 DIAGNOSIS — M549 Dorsalgia, unspecified: Secondary | ICD-10-CM | POA: Diagnosis not present

## 2016-05-12 DIAGNOSIS — N390 Urinary tract infection, site not specified: Secondary | ICD-10-CM | POA: Diagnosis not present

## 2016-05-12 DIAGNOSIS — E785 Hyperlipidemia, unspecified: Secondary | ICD-10-CM | POA: Diagnosis not present

## 2016-05-13 DIAGNOSIS — M549 Dorsalgia, unspecified: Secondary | ICD-10-CM | POA: Diagnosis not present

## 2016-05-13 DIAGNOSIS — N390 Urinary tract infection, site not specified: Secondary | ICD-10-CM | POA: Diagnosis not present

## 2016-05-13 DIAGNOSIS — A4150 Gram-negative sepsis, unspecified: Secondary | ICD-10-CM | POA: Diagnosis not present

## 2016-05-13 DIAGNOSIS — G894 Chronic pain syndrome: Secondary | ICD-10-CM | POA: Diagnosis not present

## 2016-05-13 DIAGNOSIS — E785 Hyperlipidemia, unspecified: Secondary | ICD-10-CM | POA: Diagnosis not present

## 2016-05-13 DIAGNOSIS — I10 Essential (primary) hypertension: Secondary | ICD-10-CM | POA: Diagnosis not present

## 2016-05-13 DIAGNOSIS — R1084 Generalized abdominal pain: Secondary | ICD-10-CM | POA: Diagnosis not present

## 2016-05-13 DIAGNOSIS — K449 Diaphragmatic hernia without obstruction or gangrene: Secondary | ICD-10-CM | POA: Diagnosis not present

## 2016-05-13 NOTE — Addendum Note (Signed)
Addended by: Earlene Plater on: 05/13/2016 01:22 PM   Modules accepted: Orders

## 2016-05-14 LAB — HGB A1C W/O EAG: Hgb A1c MFr Bld: 5.2 % (ref 4.8–5.6)

## 2016-05-14 LAB — SPECIMEN STATUS REPORT

## 2016-05-15 ENCOUNTER — Telehealth: Payer: Self-pay | Admitting: Family Medicine

## 2016-05-15 DIAGNOSIS — M549 Dorsalgia, unspecified: Secondary | ICD-10-CM | POA: Diagnosis not present

## 2016-05-15 DIAGNOSIS — G894 Chronic pain syndrome: Secondary | ICD-10-CM | POA: Diagnosis not present

## 2016-05-15 DIAGNOSIS — N390 Urinary tract infection, site not specified: Secondary | ICD-10-CM | POA: Diagnosis not present

## 2016-05-15 DIAGNOSIS — E785 Hyperlipidemia, unspecified: Secondary | ICD-10-CM | POA: Diagnosis not present

## 2016-05-15 DIAGNOSIS — I10 Essential (primary) hypertension: Secondary | ICD-10-CM | POA: Diagnosis not present

## 2016-05-15 DIAGNOSIS — A4150 Gram-negative sepsis, unspecified: Secondary | ICD-10-CM | POA: Diagnosis not present

## 2016-05-15 LAB — H. PYLORI ANTIGEN, STOOL: H pylori Ag, Stl: NEGATIVE

## 2016-05-15 NOTE — Progress Notes (Signed)
Patient aware.

## 2016-05-15 NOTE — Telephone Encounter (Signed)
Pt aware of Hpylori test results

## 2016-05-15 NOTE — Telephone Encounter (Signed)
Pt aware of rest of lab results

## 2016-05-15 NOTE — Telephone Encounter (Signed)
Please contact the patient that her noted that her blood work looked good. Her kidney function is stable although somewhat weak. Her glucose was a bit high but confirmation with hemoglobin A1c is negative for diabetes. It was likely just high because she was not fasting that day. The rest of her labs were unremarkable and stable

## 2016-05-15 NOTE — Telephone Encounter (Signed)
Patient seen 09/29

## 2016-05-19 ENCOUNTER — Ambulatory Visit (INDEPENDENT_AMBULATORY_CARE_PROVIDER_SITE_OTHER): Payer: Medicare Other | Admitting: Pediatrics

## 2016-05-19 ENCOUNTER — Encounter: Payer: Self-pay | Admitting: Pediatrics

## 2016-05-19 VITALS — BP 156/95 | HR 92 | Temp 97.4°F | Ht 60.0 in | Wt 212.0 lb

## 2016-05-19 DIAGNOSIS — K219 Gastro-esophageal reflux disease without esophagitis: Secondary | ICD-10-CM

## 2016-05-19 DIAGNOSIS — N390 Urinary tract infection, site not specified: Secondary | ICD-10-CM | POA: Diagnosis not present

## 2016-05-19 DIAGNOSIS — R399 Unspecified symptoms and signs involving the genitourinary system: Secondary | ICD-10-CM | POA: Diagnosis not present

## 2016-05-19 DIAGNOSIS — I10 Essential (primary) hypertension: Secondary | ICD-10-CM

## 2016-05-19 LAB — MICROSCOPIC EXAMINATION

## 2016-05-19 LAB — URINALYSIS, COMPLETE
BILIRUBIN UA: NEGATIVE
Glucose, UA: NEGATIVE
KETONES UA: NEGATIVE
LEUKOCYTES UA: NEGATIVE
Nitrite, UA: NEGATIVE
Urobilinogen, Ur: 0.2 mg/dL (ref 0.2–1.0)
pH, UA: 6 (ref 5.0–7.5)

## 2016-05-19 MED ORDER — LISINOPRIL 20 MG PO TABS: 20.0000 mg | ORAL_TABLET | Freq: Every day | ORAL | 0 refills | Status: DC

## 2016-05-19 MED ORDER — OMEPRAZOLE 20 MG PO CPDR: 20.0000 mg | DELAYED_RELEASE_CAPSULE | Freq: Every day | ORAL | 3 refills | Status: DC

## 2016-05-19 NOTE — Patient Instructions (Signed)
Take lisinopril and amlodipine once a day for blood pressure Write down Blood pressures and bring back to next clinic visit  I sent in omeprazole 8m for your stomach It may be cheaper than the dexilant  Drink more fluids, you should be urinating every 3-4 hours

## 2016-05-19 NOTE — Progress Notes (Addendum)
  Subjective:   Patient ID: Tamara Stein, female    DOB: 11-20-1940, 75 y.o.   MRN: [id number] CC: foul urine odor; Fatigue; and no appetite  HPI: Tamara Stein is a 75 y.o. female presenting for foul urine odor; Fatigue; and no appetite  Has had foul smelling urine for past month Drinks minimal fluids during day because getting up to use bathroom is a hassle No burning Some abd pain  Recent admisison for a fall 3 weeks ago She thnk shse has been feeling poorly since before that PT has been coming to house regularly  HTN: goes up and down Lowest is 120-130 Feels sleepy when BP gets high Has only been taking amlodipine Stopped lisinopril after last visit because she thought she was supposed to  Used to live in ALF Was too expensive Limited ability for moving now Often has family member with her  Relevant past medical, surgical, family and social history reviewed. Allergies and medications reviewed and updated. History  Smoking Status  . Never Smoker  Smokeless Tobacco  . Never Used   ROS: Per HPI   Objective:    BP (!) 189/87   Pulse (!) 102   Temp 97.4 F (36.3 C) (Oral)   Ht 5' (1.524 m)   Wt 212 lb (96.2 kg)   BMI 41.40 kg/m   Wt Readings from Last 3 Encounters:  05/19/16 212 lb (96.2 kg)  04/28/16 209 lb 4.8 oz (94.9 kg)  01/21/15 215 lb (97.5 kg)    Gen: NAD, alert, cooperative with exam, NCAT EYES: EOMI, no conjunctival injection, or no icterus CV: NRRR, normal S1/S2, no murmur, distal pulses 2+ b/l Resp: CTABL, no wheezes, normal WOB Abd: +BS, soft, NTND. no guarding or organomegaly Ext: non-pitting edema present b/l, warm Neuro: Alert and oriented, speech slightly dysarthric, slow.   Assessment & Plan:  Tamara Stein was seen today for decreased appetite, fatigue, foul smelling urine. Symptoms ongoing for some weeks. Lives alone, ALF too expensive. Hard to do ADLs on her own. Discussed looking into having aide during day.  Diagnoses and all orders  for this visit:  UTI symptoms UA unremarkable, small number LE, will send for culture Increase fluid intake -     Urinalysis, Complete -     Urine culture  Essential hypertension Elevated today Restart lisinopril 60m Check BPs regularly at home Bring cuff to next visit May need to increase back to 433mdaily -     lisinopril (PRINIVIL,ZESTRIL) 20 MG tablet; Take 1 tablet (20 mg total) by mouth daily.  Gastroesophageal reflux disease without esophagitis dexilant too expensive Sent in omeprazole -     omeprazole (PRILOSEC) 20 MG capsule; Take 1 capsule (20 mg total) by mouth daily.   Follow up plan: Return in about 3 weeks (around 06/09/2016) for follow up med problems. CaAssunta FoundMD [facility]  ADDENDUM: Urine culture positive CKD, allergic to PCN, aulfa Sent in cipro [id number] BID for 5 days

## 2016-05-20 DIAGNOSIS — E785 Hyperlipidemia, unspecified: Secondary | ICD-10-CM | POA: Diagnosis not present

## 2016-05-20 DIAGNOSIS — G894 Chronic pain syndrome: Secondary | ICD-10-CM | POA: Diagnosis not present

## 2016-05-20 DIAGNOSIS — M549 Dorsalgia, unspecified: Secondary | ICD-10-CM | POA: Diagnosis not present

## 2016-05-20 DIAGNOSIS — N390 Urinary tract infection, site not specified: Secondary | ICD-10-CM | POA: Diagnosis not present

## 2016-05-20 DIAGNOSIS — I10 Essential (primary) hypertension: Secondary | ICD-10-CM | POA: Diagnosis not present

## 2016-05-20 DIAGNOSIS — A4150 Gram-negative sepsis, unspecified: Secondary | ICD-10-CM | POA: Diagnosis not present

## 2016-05-21 LAB — URINE CULTURE

## 2016-05-21 MED ORDER — CIPROFLOXACIN HCL 250 MG PO TABS: 250.0000 mg | ORAL_TABLET | Freq: Two times a day (BID) | ORAL | 0 refills | Status: DC

## 2016-05-21 NOTE — Addendum Note (Signed)
Addended by: Eustaquio Maize on: 05/21/2016 02:15 PM   Modules accepted: Orders

## 2016-05-26 ENCOUNTER — Encounter: Payer: Self-pay | Admitting: Family Medicine

## 2016-05-26 ENCOUNTER — Ambulatory Visit (INDEPENDENT_AMBULATORY_CARE_PROVIDER_SITE_OTHER): Payer: Medicare Other | Admitting: Family Medicine

## 2016-05-26 VITALS — BP 125/76 | HR 83 | Temp 96.8°F

## 2016-05-26 DIAGNOSIS — I10 Essential (primary) hypertension: Secondary | ICD-10-CM | POA: Diagnosis not present

## 2016-05-26 DIAGNOSIS — N390 Urinary tract infection, site not specified: Secondary | ICD-10-CM | POA: Diagnosis not present

## 2016-05-26 DIAGNOSIS — G319 Degenerative disease of nervous system, unspecified: Secondary | ICD-10-CM

## 2016-05-26 DIAGNOSIS — E039 Hypothyroidism, unspecified: Secondary | ICD-10-CM | POA: Diagnosis not present

## 2016-05-26 DIAGNOSIS — R55 Syncope and collapse: Secondary | ICD-10-CM

## 2016-05-26 NOTE — Progress Notes (Signed)
Subjective:  Patient ID: Tamara Stein, female    DOB: [birth year]  Age: 75 y.o. MRN: [id number]  CC: Nausea (one episode today at noon - nausea, drooling and felt like she would pass out.)   HPI Kamaljit Hizer presents for Sudden onset of feeling like she would pass out occurring about 2 hours ago. The patient was waiting in the car while a friend who had driven the car to a laundromat with her when into the [place]. They were to go out to breakfast. When the friend came back to the car she found the patient slumped over and drooling. She was groggy but not unconscious. The patient had not had anything to eat. She does not remember details. However, she felt like she would pass out and felt nauseous and hot and sweaty but did not have any chest pain or shortness of breath. She did feel dizzy, lightheaded. There was no room spinning. Patient has also not had significant amounts of fluids.   History Vannie has a past medical history of Allergy; Anxiety; Cataract; Cerebellar degeneration; Chronic kidney disease; Chronic knee pain; Depression; GERD (gastroesophageal reflux disease); Hyperlipidemia; Hypertension; and Thyroid disease.   She has a past surgical history that includes Cholecystectomy; Abdominal hysterectomy; and RIGHT ELBOW.   Her family history includes Arthritis in her sister; Cancer in her brother; Diabetes in her brother; Heart disease in her brother; Hyperlipidemia in her sister; Hypertension in her sister.She reports that she has never smoked. She has never used smokeless tobacco. She reports that she does not drink alcohol or use drugs.    ROS Review of Systems  Constitutional: Negative for activity change, appetite change and fever.  HENT: Negative for congestion, rhinorrhea and sore throat.   Eyes: Positive for visual disturbance (can't see well. Requests eye referral).  Respiratory: Negative for cough and shortness of breath.   Cardiovascular: Negative for  chest pain and palpitations.  Gastrointestinal: Negative for abdominal pain, diarrhea and nausea.  Genitourinary: Negative for dysuria.  Musculoskeletal: Negative for arthralgias and myalgias.  Neurological: Positive for dizziness and light-headedness.    Objective:  BP 125/76 (BP Location: Right Arm)   Pulse 83   Temp (!) 96.8 F (36 C) (Oral)   BP Readings from Last 3 Encounters:  05/26/16 125/76  05/19/16 (!) 156/95  05/09/16 (!) 157/91    Wt Readings from Last 3 Encounters:  05/19/16 212 lb (96.2 kg)  04/28/16 209 lb 4.8 oz (94.9 kg)  01/21/15 215 lb (97.5 kg)     Physical Exam  Constitutional: She is oriented to person, place, and time. She appears well-nourished. No distress.  HENT:  Head: Normocephalic and atraumatic.  Right Ear: External ear normal.  Left Ear: External ear normal.  Nose: Nose normal.  Mouth/Throat: Oropharynx is clear and moist.  Eyes: Conjunctivae and EOM are normal. Pupils are equal, round, and reactive to light.  Neck: Normal range of motion. Neck supple. No thyromegaly present.  Cardiovascular: Normal rate, regular rhythm and normal heart sounds.   No murmur heard. Pulmonary/Chest: Effort normal and breath sounds normal. No respiratory distress. She has no wheezes. She has no rales.  Abdominal: Soft. Bowel sounds are normal. She exhibits no distension. There is no tenderness.  Lymphadenopathy:    She has no cervical adenopathy.  Neurological: She is alert and oriented to person, place, and time. She exhibits abnormal muscle tone. Coordination (wheel chair bound) abnormal.  Skin: Skin is warm and dry.  Psychiatric: She has a normal mood and  affect. Her behavior is normal. Judgment and thought content normal.     Lab Results  Component Value Date   WBC 5.6 05/09/2016   HGB 10.1 (L) 04/27/2016   HCT 34.1 05/09/2016   PLT 234 05/09/2016   GLUCOSE 154 (H) 05/09/2016   CHOL 138 06/19/2015   TRIG 106 06/19/2015   HDL 44 06/19/2015    LDLCALC 73 06/19/2015   ALT 21 05/09/2016   AST 23 05/09/2016   NA 147 (H) 05/09/2016   K 3.7 05/09/2016   CL 106 05/09/2016   CREATININE 1.33 (H) 05/09/2016   BUN 26 05/09/2016   CO2 26 05/09/2016   TSH 2.360 02/29/2016   HGBA1C 5.2 05/09/2016    Ct Head Wo Contrast  Result Date: 04/26/2016 CLINICAL DATA:  fall this Am from her mobile chair. denies loc nor head injury. No pain , nor injury yet pt requesting BP control. Hx HTN. Denies chest pain nor headache no further symptoms. Alert and oriented x 4. Pt has slurred speech Normal to baseline. EXAM: CT HEAD WITHOUT CONTRAST TECHNIQUE: Contiguous axial images were obtained from the base of the skull through the vertex without intravenous contrast. COMPARISON:  Head CT 08/15/ 2015 in FINDINGS: Brain: No acute intracranial hemorrhage. No focal mass lesion. No CT evidence of acute infarction. No midline shift or mass effect. No hydrocephalus. Basilar cisterns are patent. There are periventricular and subcortical white matter hypodensities. Generalized cortical atrophy. Vascular: No hyperdense vessel or unexpected calcification. Skull: Normal. Negative for fracture or focal lesion. Sinuses/Orbits: No acute finding. Other: None IMPRESSION: 1. No acute intracranial findings. 2. 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 Electronically Signed   By: Suzy Bouchard M.D.   On: 04/26/2016 11:21   Mri Brain Without Contrast  Result Date: 04/26/2016 CLINICAL DATA:  75 year old female with syncope and fall. Initial encounter. EXAM: MRI HEAD WITHOUT CONTRAST TECHNIQUE: Multiplanar, multiecho pulse sequences of the brain and surrounding structures were obtained without intravenous contrast. COMPARISON:  Head CT without contrast 1104 hours today. Brain MRI 03/26/2014. FINDINGS: Brain: Chronic cerebellar volume loss. There also appears to be a degree of chronic brainstem volume loss. Cerebral hemisphere volume is better preserved. No restricted  diffusion to suggest acute infarction. No midline shift, mass effect, evidence of mass lesion, ventriculomegaly, extra-axial collection or acute intracranial hemorrhage. Cervicomedullary junction and pituitary are within normal limits. Patchy bilateral cerebral white matter T2 and FLAIR hyperintensity has not significantly changed since 2015. Patchy pontine T2 and FLAIR hyperintensity does appear mildly progressed. No cortical encephalomalacia or chronic cerebral blood products identified. Moderate T2 heterogeneity in the deep gray matter nuclei appears stable and largely related to perivascular spaces. Vascular: Major intracranial vascular flow voids are stable with generalized intracranial artery dolichoectasia. Skull and upper cervical spine: Stable. Visualized bone marrow signal is within normal limits. Sinuses/Orbits: Stable orbits soft tissues. Paranasal sinuses are stable and clear. Trace left mastoid effusion is unchanged. Visible internal auditory structures appear normal. Other: Negative scalp soft tissues. IMPRESSION: 1.  No acute intracranial abnormality. 2. Stable noncontrast MRI appearance the brain since 2015 with advanced chronic cerebellar atrophy, and moderately advanced nonspecific signal changes in the cerebral white matter and brainstem which would most commonly reflect chronic small vessel disease. Electronically Signed   By: Genevie Ann M.D.   On: 04/26/2016 20:16    Assessment & Plan:   Uchenna was seen today for nausea.  Diagnoses and all orders for this visit:  Syncope, unspecified syncope type -  EKG 12-Lead  Essential hypertension  Hypothyroidism, unspecified type  Lower urinary tract infectious disease  Cerebellar degeneration  Syncope, likely vasovagal. EKG - No ischemic changes. LeadIII has Q's, but II is upward, F is equivocal & ST segments not elevated or depressed  I have discontinued Ms. Mahabir's ciprofloxacin. I am also having her maintain her aspirin EC,  multivitamin with minerals, Calcium Carbonate-Vitamin D (CALTRATE 600+D PO), ferrous sulfate, meclizine, levothyroxine, raloxifene, atorvastatin, traMADol, fluticasone, DULoxetine, dexlansoprazole, amLODipine, lisinopril, and omeprazole.  No orders of the defined types were placed in this encounter.    Follow-up: Return in about 6 weeks (around 07/07/2016).  Claretta Fraise, M.D.

## 2016-05-27 DIAGNOSIS — M549 Dorsalgia, unspecified: Secondary | ICD-10-CM | POA: Diagnosis not present

## 2016-05-27 DIAGNOSIS — I10 Essential (primary) hypertension: Secondary | ICD-10-CM | POA: Diagnosis not present

## 2016-05-27 DIAGNOSIS — E785 Hyperlipidemia, unspecified: Secondary | ICD-10-CM | POA: Diagnosis not present

## 2016-05-27 DIAGNOSIS — N390 Urinary tract infection, site not specified: Secondary | ICD-10-CM | POA: Diagnosis not present

## 2016-05-27 DIAGNOSIS — G894 Chronic pain syndrome: Secondary | ICD-10-CM | POA: Diagnosis not present

## 2016-05-27 DIAGNOSIS — A4150 Gram-negative sepsis, unspecified: Secondary | ICD-10-CM | POA: Diagnosis not present

## 2016-05-28 DIAGNOSIS — M549 Dorsalgia, unspecified: Secondary | ICD-10-CM | POA: Diagnosis not present

## 2016-05-28 DIAGNOSIS — G894 Chronic pain syndrome: Secondary | ICD-10-CM | POA: Diagnosis not present

## 2016-05-28 DIAGNOSIS — E785 Hyperlipidemia, unspecified: Secondary | ICD-10-CM | POA: Diagnosis not present

## 2016-05-28 DIAGNOSIS — A4150 Gram-negative sepsis, unspecified: Secondary | ICD-10-CM | POA: Diagnosis not present

## 2016-05-28 DIAGNOSIS — N390 Urinary tract infection, site not specified: Secondary | ICD-10-CM | POA: Diagnosis not present

## 2016-05-28 DIAGNOSIS — I10 Essential (primary) hypertension: Secondary | ICD-10-CM | POA: Diagnosis not present

## 2016-06-03 ENCOUNTER — Other Ambulatory Visit: Payer: Self-pay | Admitting: Family Medicine

## 2016-06-03 DIAGNOSIS — E785 Hyperlipidemia, unspecified: Secondary | ICD-10-CM | POA: Diagnosis not present

## 2016-06-03 DIAGNOSIS — N390 Urinary tract infection, site not specified: Secondary | ICD-10-CM | POA: Diagnosis not present

## 2016-06-03 DIAGNOSIS — G894 Chronic pain syndrome: Secondary | ICD-10-CM | POA: Diagnosis not present

## 2016-06-03 DIAGNOSIS — I10 Essential (primary) hypertension: Secondary | ICD-10-CM | POA: Diagnosis not present

## 2016-06-03 DIAGNOSIS — M549 Dorsalgia, unspecified: Secondary | ICD-10-CM | POA: Diagnosis not present

## 2016-06-03 DIAGNOSIS — A4150 Gram-negative sepsis, unspecified: Secondary | ICD-10-CM | POA: Diagnosis not present

## 2016-06-05 DIAGNOSIS — G894 Chronic pain syndrome: Secondary | ICD-10-CM | POA: Diagnosis not present

## 2016-06-05 DIAGNOSIS — M549 Dorsalgia, unspecified: Secondary | ICD-10-CM | POA: Diagnosis not present

## 2016-06-05 DIAGNOSIS — I10 Essential (primary) hypertension: Secondary | ICD-10-CM | POA: Diagnosis not present

## 2016-06-05 DIAGNOSIS — E785 Hyperlipidemia, unspecified: Secondary | ICD-10-CM | POA: Diagnosis not present

## 2016-06-05 DIAGNOSIS — N390 Urinary tract infection, site not specified: Secondary | ICD-10-CM | POA: Diagnosis not present

## 2016-06-05 DIAGNOSIS — A4150 Gram-negative sepsis, unspecified: Secondary | ICD-10-CM | POA: Diagnosis not present

## 2016-06-09 DIAGNOSIS — I10 Essential (primary) hypertension: Secondary | ICD-10-CM | POA: Diagnosis not present

## 2016-06-09 DIAGNOSIS — G894 Chronic pain syndrome: Secondary | ICD-10-CM | POA: Diagnosis not present

## 2016-06-09 DIAGNOSIS — M549 Dorsalgia, unspecified: Secondary | ICD-10-CM | POA: Diagnosis not present

## 2016-06-09 DIAGNOSIS — A4150 Gram-negative sepsis, unspecified: Secondary | ICD-10-CM | POA: Diagnosis not present

## 2016-06-09 DIAGNOSIS — N390 Urinary tract infection, site not specified: Secondary | ICD-10-CM | POA: Diagnosis not present

## 2016-06-09 DIAGNOSIS — E785 Hyperlipidemia, unspecified: Secondary | ICD-10-CM | POA: Diagnosis not present

## 2016-06-10 ENCOUNTER — Ambulatory Visit (INDEPENDENT_AMBULATORY_CARE_PROVIDER_SITE_OTHER): Payer: Medicare Other | Admitting: Family Medicine

## 2016-06-10 DIAGNOSIS — G894 Chronic pain syndrome: Secondary | ICD-10-CM

## 2016-06-10 DIAGNOSIS — I10 Essential (primary) hypertension: Secondary | ICD-10-CM | POA: Diagnosis not present

## 2016-06-10 DIAGNOSIS — N390 Urinary tract infection, site not specified: Secondary | ICD-10-CM | POA: Diagnosis not present

## 2016-06-10 DIAGNOSIS — A4150 Gram-negative sepsis, unspecified: Secondary | ICD-10-CM

## 2016-06-11 DIAGNOSIS — I10 Essential (primary) hypertension: Secondary | ICD-10-CM | POA: Diagnosis not present

## 2016-06-11 DIAGNOSIS — E785 Hyperlipidemia, unspecified: Secondary | ICD-10-CM | POA: Diagnosis not present

## 2016-06-11 DIAGNOSIS — N390 Urinary tract infection, site not specified: Secondary | ICD-10-CM | POA: Diagnosis not present

## 2016-06-11 DIAGNOSIS — G894 Chronic pain syndrome: Secondary | ICD-10-CM | POA: Diagnosis not present

## 2016-06-11 DIAGNOSIS — M549 Dorsalgia, unspecified: Secondary | ICD-10-CM | POA: Diagnosis not present

## 2016-06-11 DIAGNOSIS — A4150 Gram-negative sepsis, unspecified: Secondary | ICD-10-CM | POA: Diagnosis not present

## 2016-06-18 DIAGNOSIS — M549 Dorsalgia, unspecified: Secondary | ICD-10-CM | POA: Diagnosis not present

## 2016-06-18 DIAGNOSIS — E785 Hyperlipidemia, unspecified: Secondary | ICD-10-CM | POA: Diagnosis not present

## 2016-06-18 DIAGNOSIS — A4150 Gram-negative sepsis, unspecified: Secondary | ICD-10-CM | POA: Diagnosis not present

## 2016-06-18 DIAGNOSIS — I10 Essential (primary) hypertension: Secondary | ICD-10-CM | POA: Diagnosis not present

## 2016-06-18 DIAGNOSIS — G894 Chronic pain syndrome: Secondary | ICD-10-CM | POA: Diagnosis not present

## 2016-06-18 DIAGNOSIS — N390 Urinary tract infection, site not specified: Secondary | ICD-10-CM | POA: Diagnosis not present

## 2016-06-20 ENCOUNTER — Ambulatory Visit (INDEPENDENT_AMBULATORY_CARE_PROVIDER_SITE_OTHER): Payer: Medicare Other | Admitting: Pediatrics

## 2016-06-20 ENCOUNTER — Encounter: Payer: Self-pay | Admitting: Pediatrics

## 2016-06-20 VITALS — BP 174/92 | HR 90 | Temp 97.1°F | Ht 60.0 in

## 2016-06-20 DIAGNOSIS — L918 Other hypertrophic disorders of the skin: Secondary | ICD-10-CM | POA: Diagnosis not present

## 2016-06-20 DIAGNOSIS — T148XXA Other injury of unspecified body region, initial encounter: Secondary | ICD-10-CM

## 2016-06-20 DIAGNOSIS — I10 Essential (primary) hypertension: Secondary | ICD-10-CM

## 2016-06-20 DIAGNOSIS — A4150 Gram-negative sepsis, unspecified: Secondary | ICD-10-CM | POA: Diagnosis not present

## 2016-06-20 DIAGNOSIS — N390 Urinary tract infection, site not specified: Secondary | ICD-10-CM | POA: Diagnosis not present

## 2016-06-20 DIAGNOSIS — E785 Hyperlipidemia, unspecified: Secondary | ICD-10-CM | POA: Diagnosis not present

## 2016-06-20 DIAGNOSIS — G894 Chronic pain syndrome: Secondary | ICD-10-CM | POA: Diagnosis not present

## 2016-06-20 DIAGNOSIS — M549 Dorsalgia, unspecified: Secondary | ICD-10-CM | POA: Diagnosis not present

## 2016-06-20 MED ORDER — AMLODIPINE BESYLATE 10 MG PO TABS: 10.0000 mg | ORAL_TABLET | Freq: Every day | ORAL | 3 refills | Status: DC

## 2016-06-20 NOTE — Progress Notes (Signed)
  Subjective:   Patient ID: Tamara Stein, female    DOB: 10-27-1940, 75 y.o.   MRN: [id number] CC: Hypertension and Brusing (Right Hand)  HPI: Tamara Stein is a 75 y.o. female presenting for Hypertension and Brusing (Right Hand)  HTN: at home up to 150s, usually 130s, 140s at home Takes medications regularly  Bruise R hand after bumping into furniture at home Happened about a week ago Confined to motorized wheelchair now during the day Sometimes does run into things, doesn't think she can reconfigure her furniture at home to improve mobility Daughter with her today as well, agrees Generally does well with getting around and doing ADLs  Forearm skin tag that bothers her, catches on her shirt sleeves  Relevant past medical, surgical, family and social history reviewed. Allergies and medications reviewed and updated. History  Smoking Status  . Never Smoker  Smokeless Tobacco  . Never Used   ROS: Per HPI   Objective:    BP (!) 174/92   Pulse 90   Temp 97.1 F (36.2 C) (Oral)   Ht 5' (1.524 m)   Wt Readings from Last 3 Encounters:  05/19/16 212 lb (96.2 kg)  04/28/16 209 lb 4.8 oz (94.9 kg)  01/21/15 215 lb (97.5 kg)    Gen: NAD, alert, cooperative with exam, NCAT EYES: EOMI, no conjunctival injection, or no icterus CV: NRRR, normal S1/S2 Resp: CTABL, no wheezes, normal WOB Neuro: Alert and oriented Skin: R forearm with small pedunculated 17m skin tag R hand 3rd, 4th fingers with some bruising over proximal phalange  Assessment & Plan:  RKarinnawas seen today for hypertension and brusing.  Diagnoses and all orders for this visit:  Essential hypertension Elevated today Increase amlodipine Cont to check at home -     amLODipine (NORVASC) 10 MG tablet; Take 1 tablet (10 mg total) by mouth daily. For blood pressure  Skin tag Bothers her, catches on clothes Will remove today See proc note below  Bruise Discussed safe transfers, reassessing furniture  location at home to optimize wheelchair moving  PROCEDURE Skin tag removal: After risks, benefits and alternatives discussed with pt and pt decided to proceed, using scissors one R forearm tag was removed, Minimal bleeding following procedure, stopped with silver nitrate stick. Pt tolerated procedure well.    Follow up plan: Return in about 4 weeks (around 07/18/2016). CAssunta Found MD WCyrus

## 2016-06-24 DIAGNOSIS — I10 Essential (primary) hypertension: Secondary | ICD-10-CM | POA: Diagnosis not present

## 2016-06-24 DIAGNOSIS — A4150 Gram-negative sepsis, unspecified: Secondary | ICD-10-CM | POA: Diagnosis not present

## 2016-06-24 DIAGNOSIS — N390 Urinary tract infection, site not specified: Secondary | ICD-10-CM | POA: Diagnosis not present

## 2016-06-24 DIAGNOSIS — G894 Chronic pain syndrome: Secondary | ICD-10-CM | POA: Diagnosis not present

## 2016-06-24 DIAGNOSIS — M549 Dorsalgia, unspecified: Secondary | ICD-10-CM | POA: Diagnosis not present

## 2016-06-24 DIAGNOSIS — E785 Hyperlipidemia, unspecified: Secondary | ICD-10-CM | POA: Diagnosis not present

## 2016-06-26 DIAGNOSIS — N390 Urinary tract infection, site not specified: Secondary | ICD-10-CM | POA: Diagnosis not present

## 2016-06-26 DIAGNOSIS — I10 Essential (primary) hypertension: Secondary | ICD-10-CM | POA: Diagnosis not present

## 2016-06-26 DIAGNOSIS — A4150 Gram-negative sepsis, unspecified: Secondary | ICD-10-CM | POA: Diagnosis not present

## 2016-06-26 DIAGNOSIS — M549 Dorsalgia, unspecified: Secondary | ICD-10-CM | POA: Diagnosis not present

## 2016-06-26 DIAGNOSIS — G894 Chronic pain syndrome: Secondary | ICD-10-CM | POA: Diagnosis not present

## 2016-06-26 DIAGNOSIS — E785 Hyperlipidemia, unspecified: Secondary | ICD-10-CM | POA: Diagnosis not present

## 2016-07-01 ENCOUNTER — Ambulatory Visit (INDEPENDENT_AMBULATORY_CARE_PROVIDER_SITE_OTHER): Payer: Medicare Other | Admitting: Pediatrics

## 2016-07-01 ENCOUNTER — Encounter: Payer: Self-pay | Admitting: Pediatrics

## 2016-07-01 VITALS — BP 138/89 | HR 89 | Temp 96.8°F | Ht 60.0 in | Wt 202.0 lb

## 2016-07-01 DIAGNOSIS — I10 Essential (primary) hypertension: Secondary | ICD-10-CM

## 2016-07-01 DIAGNOSIS — M25562 Pain in left knee: Secondary | ICD-10-CM | POA: Diagnosis not present

## 2016-07-01 DIAGNOSIS — M25561 Pain in right knee: Secondary | ICD-10-CM | POA: Diagnosis not present

## 2016-07-01 DIAGNOSIS — N390 Urinary tract infection, site not specified: Secondary | ICD-10-CM | POA: Diagnosis not present

## 2016-07-01 DIAGNOSIS — R399 Unspecified symptoms and signs involving the genitourinary system: Secondary | ICD-10-CM

## 2016-07-01 DIAGNOSIS — G8929 Other chronic pain: Secondary | ICD-10-CM | POA: Diagnosis not present

## 2016-07-01 DIAGNOSIS — R8281 Pyuria: Secondary | ICD-10-CM

## 2016-07-01 LAB — MICROSCOPIC EXAMINATION: Renal Epithel, UA: NONE SEEN /hpf

## 2016-07-01 LAB — URINALYSIS, COMPLETE
Bilirubin, UA: NEGATIVE
Glucose, UA: NEGATIVE
KETONES UA: NEGATIVE
NITRITE UA: POSITIVE — AB
SPEC GRAV UA: 1.02 (ref 1.005–1.030)
Urobilinogen, Ur: 0.2 mg/dL (ref 0.2–1.0)
pH, UA: 5 (ref 5.0–7.5)

## 2016-07-01 MED ORDER — AMLODIPINE BESYLATE 10 MG PO TABS: 10.0000 mg | ORAL_TABLET | Freq: Every day | ORAL | 3 refills | Status: DC

## 2016-07-01 MED ORDER — CIPROFLOXACIN HCL 250 MG PO TABS: 250.0000 mg | ORAL_TABLET | Freq: Two times a day (BID) | ORAL | 0 refills | Status: DC

## 2016-07-01 NOTE — Progress Notes (Signed)
  Subjective:   Patient ID: Tamara Stein, female    DOB: Jun 13, 1941, 75 y.o.   MRN: [id number] CC: Knee Pain  HPI: Tamara Stein is a 75 y.o. female presenting for Knee Pain  Present bilateral Has had steroid injections in the past, several months ago, did help improve pain Most recent injections hurt when being given  Has had foul smelling urine for the past few days, more so than usual No dysuria, no urgency or frequency Normal appetite No fevers  Relevant past medical, surgical, family and social history reviewed. Allergies and medications reviewed and updated. History  Smoking Status  . Never Smoker  Smokeless Tobacco  . Never Used   ROS: Per HPI   Objective:    BP 138/89   Pulse 89   Temp (!) 96.8 F (36 C) (Oral)   Ht 5' (1.524 m)   Wt 202 lb (91.6 kg)   BMI 39.45 kg/m   Wt Readings from Last 3 Encounters:  07/01/16 202 lb (91.6 kg)  05/19/16 212 lb (96.2 kg)  04/28/16 209 lb 4.8 oz (94.9 kg)    Gen: NAD, alert, cooperative with exam, NCAT EYES: EOMI, no conjunctival injection, or no icterus CV: NRRR, normal S1/S2, no murmur, distal pulses 2+ b/l Resp: CTABL, no wheezes, normal WOB Abd: +BS, soft, NTND. no guarding or organomegaly Ext: No edema, warm Neuro: Alert and oriented, strength equal b/l UE and LE, coordination grossly normal MSK: able to straighten knees to apprx 75 degrees, limited by pain, bony enlargement knees b/l, large amount subcutaneous tissue knees b/l. difficult to identify knee landmarks, some pain present with palpation joint line b/l  Assessment & Plan:  [name] was seen today for knee pain.  Diagnoses and all orders for this visit:  UTI symptoms Pyuria on UA Treat with cipro, send for culture -     Urinalysis, Complete -     Urine culture  Essential hypertension Improved control, cont amlodipine, let me know if BPs elevated at home -     amLODipine (NORVASC) 10 MG tablet; Take 1 tablet (10 mg total) by mouth daily. 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

## 2016-07-03 LAB — URINE CULTURE

## 2016-07-14 ENCOUNTER — Encounter (HOSPITAL_COMMUNITY): Payer: Self-pay | Admitting: Emergency Medicine

## 2016-07-14 ENCOUNTER — Inpatient Hospital Stay (HOSPITAL_COMMUNITY)
Admission: EM | Admit: 2016-07-14 | Discharge: 2016-07-21 | DRG: 394 | Disposition: A | Discharge status: Home / Self Care | Payer: Medicare Other | Attending: Family Medicine | Admitting: Family Medicine

## 2016-07-14 DIAGNOSIS — R531 Weakness: Secondary | ICD-10-CM | POA: Diagnosis not present

## 2016-07-14 DIAGNOSIS — I10 Essential (primary) hypertension: Secondary | ICD-10-CM | POA: Diagnosis present

## 2016-07-14 DIAGNOSIS — D62 Acute posthemorrhagic anemia: Secondary | ICD-10-CM | POA: Diagnosis not present

## 2016-07-14 DIAGNOSIS — Z90710 Acquired absence of both cervix and uterus: Secondary | ICD-10-CM

## 2016-07-14 DIAGNOSIS — Z8261 Family history of arthritis: Secondary | ICD-10-CM

## 2016-07-14 DIAGNOSIS — N390 Urinary tract infection, site not specified: Secondary | ICD-10-CM | POA: Diagnosis present

## 2016-07-14 DIAGNOSIS — Z888 Allergy status to other drugs, medicaments and biological substances status: Secondary | ICD-10-CM

## 2016-07-14 DIAGNOSIS — Z833 Family history of diabetes mellitus: Secondary | ICD-10-CM

## 2016-07-14 DIAGNOSIS — R4701 Aphasia: Secondary | ICD-10-CM | POA: Diagnosis present

## 2016-07-14 DIAGNOSIS — Z7982 Long term (current) use of aspirin: Secondary | ICD-10-CM

## 2016-07-14 DIAGNOSIS — K559 Vascular disorder of intestine, unspecified: Principal | ICD-10-CM | POA: Diagnosis present

## 2016-07-14 DIAGNOSIS — E860 Dehydration: Secondary | ICD-10-CM | POA: Diagnosis not present

## 2016-07-14 DIAGNOSIS — K529 Noninfective gastroenteritis and colitis, unspecified: Secondary | ICD-10-CM

## 2016-07-14 DIAGNOSIS — Z882 Allergy status to sulfonamides status: Secondary | ICD-10-CM

## 2016-07-14 DIAGNOSIS — G319 Degenerative disease of nervous system, unspecified: Secondary | ICD-10-CM

## 2016-07-14 DIAGNOSIS — Z809 Family history of malignant neoplasm, unspecified: Secondary | ICD-10-CM

## 2016-07-14 DIAGNOSIS — Z7401 Bed confinement status: Secondary | ICD-10-CM

## 2016-07-14 DIAGNOSIS — E039 Hypothyroidism, unspecified: Secondary | ICD-10-CM | POA: Diagnosis present

## 2016-07-14 DIAGNOSIS — R109 Unspecified abdominal pain: Secondary | ICD-10-CM | POA: Diagnosis not present

## 2016-07-14 DIAGNOSIS — K219 Gastro-esophageal reflux disease without esophagitis: Secondary | ICD-10-CM | POA: Diagnosis not present

## 2016-07-14 DIAGNOSIS — B9620 Unspecified Escherichia coli [E. coli] as the cause of diseases classified elsewhere: Secondary | ICD-10-CM | POA: Diagnosis present

## 2016-07-14 DIAGNOSIS — Z993 Dependence on wheelchair: Secondary | ICD-10-CM

## 2016-07-14 DIAGNOSIS — Z880 Allergy status to penicillin: Secondary | ICD-10-CM

## 2016-07-14 DIAGNOSIS — Z79899 Other long term (current) drug therapy: Secondary | ICD-10-CM

## 2016-07-14 DIAGNOSIS — Z8249 Family history of ischemic heart disease and other diseases of the circulatory system: Secondary | ICD-10-CM

## 2016-07-14 DIAGNOSIS — Z886 Allergy status to analgesic agent status: Secondary | ICD-10-CM

## 2016-07-14 DIAGNOSIS — Z9049 Acquired absence of other specified parts of digestive tract: Secondary | ICD-10-CM

## 2016-07-14 LAB — CBC WITH DIFFERENTIAL/PLATELET
BASOS ABS: 0 10*3/uL (ref 0.0–0.1)
BASOS PCT: 0 %
EOS ABS: 0.1 10*3/uL (ref 0.0–0.7)
EOS PCT: 1 %
HCT: 33.3 % — ABNORMAL LOW (ref 36.0–46.0)
Hemoglobin: 10.6 g/dL — ABNORMAL LOW (ref 12.0–15.0)
Lymphocytes Relative: 7 %
Lymphs Abs: 0.9 10*3/uL (ref 0.7–4.0)
MCH: 29.9 pg (ref 26.0–34.0)
MCHC: 31.8 g/dL (ref 30.0–36.0)
MCV: 94.1 fL (ref 78.0–100.0)
MONO ABS: 1 10*3/uL (ref 0.1–1.0)
Monocytes Relative: 8 %
Neutro Abs: 10 10*3/uL — ABNORMAL HIGH (ref 1.7–7.7)
Neutrophils Relative %: 84 %
PLATELETS: 200 10*3/uL (ref 150–400)
RBC: 3.54 MIL/uL — ABNORMAL LOW (ref 3.87–5.11)
RDW: 14.7 % (ref 11.5–15.5)
WBC: 12 10*3/uL — ABNORMAL HIGH (ref 4.0–10.5)

## 2016-07-14 LAB — C DIFFICILE QUICK SCREEN W PCR REFLEX
C DIFFICLE (CDIFF) ANTIGEN: NEGATIVE
C Diff interpretation: NOT DETECTED
C Diff toxin: NEGATIVE

## 2016-07-14 LAB — URINALYSIS, ROUTINE W REFLEX MICROSCOPIC
GLUCOSE, UA: NEGATIVE mg/dL
HGB URINE DIPSTICK: NEGATIVE
Ketones, ur: NEGATIVE mg/dL
Nitrite: POSITIVE — AB
PROTEIN: 100 mg/dL — AB
Specific Gravity, Urine: 1.023 (ref 1.005–1.030)
pH: 5 (ref 5.0–8.0)

## 2016-07-14 LAB — COMPREHENSIVE METABOLIC PANEL
ALBUMIN: 3.9 g/dL (ref 3.5–5.0)
ALK PHOS: 92 U/L (ref 38–126)
ALT: 27 U/L (ref 14–54)
ANION GAP: 9 (ref 5–15)
AST: 30 U/L (ref 15–41)
BILIRUBIN TOTAL: 0.6 mg/dL (ref 0.3–1.2)
BUN: 36 mg/dL — AB (ref 6–20)
CALCIUM: 8.8 mg/dL — AB (ref 8.9–10.3)
CO2: 21 mmol/L — ABNORMAL LOW (ref 22–32)
CREATININE: 1.41 mg/dL — AB (ref 0.44–1.00)
Chloride: 113 mmol/L — ABNORMAL HIGH (ref 101–111)
GFR calc Af Amer: 41 mL/min — ABNORMAL LOW (ref >60)
GFR calc non Af Amer: 35 mL/min — ABNORMAL LOW (ref >60)
GLUCOSE: 146 mg/dL — AB (ref 65–99)
Potassium: 3.6 mmol/L (ref 3.5–5.1)
Sodium: 143 mmol/L (ref 135–145)
TOTAL PROTEIN: 6.6 g/dL (ref 6.5–8.1)

## 2016-07-14 LAB — URINE MICROSCOPIC-ADD ON: Squamous Epithelial / LPF: NONE SEEN

## 2016-07-14 LAB — I-STAT TROPONIN, ED: Troponin i, poc: 0 ng/mL (ref 0.00–0.08)

## 2016-07-14 MED ORDER — IOPAMIDOL (ISOVUE-300) INJECTION 61%
INTRAVENOUS | Status: AC
  Filled 2016-07-14: qty 30

## 2016-07-14 MED ORDER — SODIUM CHLORIDE 0.9 % IV BOLUS (SEPSIS)
500.0000 mL | Freq: Once | INTRAVENOUS | Status: AC
  Administered 2016-07-14: 500 mL via INTRAVENOUS

## 2016-07-14 MED ORDER — IOPAMIDOL (ISOVUE-300) INJECTION 61%: 30.0000 mL | Freq: Once | INTRAVENOUS | Status: DC | PRN

## 2016-07-14 NOTE — ED Triage Notes (Signed)
Pt comes to ed, via ems, c/o of abdominal pain and generalized weakness. Pt is wheel chair bound, and not able to ambulate. Pt comes from home, [address], [place].   Nausea, and vomiting, hx of GERD.    HTN, pt. V/s on arrival 134/78, hr 84, rr16, cbg188. Sinus rhthyme on 12 lead. Iv 20 in RAC. ( 500ns going/ 100 ns)  Allergies sulfa, antibiotics, codeine.  Alert x4.

## 2016-07-14 NOTE — ED Provider Notes (Signed)
[facility] DEPT Provider Note   CSN: [id number] Arrival date & time: 07/14/16  2047     History   Chief Complaint Chief Complaint  Patient presents with  . Abdominal Pain  . Weakness    HPI Tamara Stein is a 75 y.o. female.  The history is provided by the patient. No language interpreter was used.  Abdominal Pain    Weakness    Tamara Stein is a 75 y.o. female who presents to the Emergency Department complaining of abdominal pain.  At 6:00 this evening she developed severe, sudden onset lower abdominal pain. She went to have a bowel movement that was normal and syncopized on the commode. She had associated diaphoresis. She also reports nausea and vomiting. Since arriving to the emergency department she reports onset of sudden, profuse diarrhea. No melena or hematochezia. No fevers, chest pain, shortness breath. She was recently on antibiotics for a urinary tract infection. Past Medical History:  Diagnosis Date  . Allergy   . Anxiety   . Cataract   . Cerebellar degeneration   . Chronic kidney disease   . Chronic knee pain   . Depression   . GERD (gastroesophageal reflux disease)   . Hyperlipidemia   . Hypertension   . Thyroid disease     Patient Active Problem List   Diagnosis Date Noted  . Syncope 04/26/2016  . Lower urinary tract infectious disease 04/26/2016  . Fall   . Pain of both shoulder joints 02/29/2016  . Arthritis of knee 09/24/2015  . Constipation 06/05/2015  . Depression 06/05/2015  . Hypothyroidism 06/05/2015  . GERD (gastroesophageal reflux disease)   . Hypertension   . Hyperlipidemia   . Cerebellar degeneration     Past Surgical History:  Procedure Laterality Date  . ABDOMINAL HYSTERECTOMY    . CHOLECYSTECTOMY    . RIGHT ELBOW      OB History    No data available       Home Medications    Prior to Admission medications   Medication Sig Start Date End Date Taking? Authorizing Provider  amLODipine (NORVASC) 10 MG  tablet Take 1 tablet (10 mg total) by mouth daily. For blood pressure 07/01/16   Eustaquio Maize, MD  amLODipine (NORVASC) 5 MG tablet Take 5 mg by mouth daily. 06/12/16   Historical Provider, MD  aspirin EC 81 MG tablet Take 81 mg by mouth every morning.     Historical Provider, MD  atorvastatin (LIPITOR) 40 MG tablet Take 1 tablet by mouth at  bedtime 01/03/16   Claretta Fraise, MD  Calcium Carbonate-Vitamin D (CALTRATE 600+D PO) Take 1 tablet by mouth daily.    Historical Provider, MD  ciprofloxacin (CIPRO) 250 MG tablet Take 1 tablet (250 mg total) by mouth 2 (two) times daily. 07/01/16   Eustaquio Maize, MD  dexlansoprazole (DEXILANT) 60 MG capsule Take 1 capsule (60 mg total) by mouth daily. On an empty stomach, then do not eat for 1 hour 05/09/16   Claretta Fraise, MD  diclofenac (VOLTAREN) 50 MG EC tablet Take 50 mg by mouth 3 (three) times daily. 07/09/16   Historical Provider, MD  DULoxetine (CYMBALTA) 60 MG capsule TAKE 1 CAPSULE BY MOUTH  DAILY 05/08/16   Claretta Fraise, MD  ferrous sulfate 325 (65 FE) MG tablet Take 325 mg by mouth daily with breakfast. Reported on 02/29/2016    Historical Provider, MD  fluticasone (FLONASE) 50 MCG/ACT nasal spray Place 2 sprays into both nostrils daily. 03/11/16   Cletus Gash  [name], MD  levothyroxine (SYNTHROID, LEVOTHROID) 50 MCG tablet Take 1 tablet by mouth  daily before breakfast 01/03/16   Claretta Fraise, MD  lisinopril (PRINIVIL,ZESTRIL) 20 MG tablet Take 1 tablet (20 mg total) by mouth daily. 05/19/16   Eustaquio Maize, MD  meclizine (ANTIVERT) 25 MG tablet Take 1 tablet (25 mg total) by mouth 3 (three) times daily as needed for dizziness. 08/24/15   Claretta Fraise, MD  Multiple Vitamin (MULTIVITAMIN WITH MINERALS) TABS tablet Take 1 tablet by mouth daily.    Historical Provider, MD  omeprazole (PRILOSEC) 20 MG capsule Take 1 capsule (20 mg total) by mouth daily. 05/19/16   Eustaquio Maize, MD  omeprazole (PRILOSEC) 40 MG capsule Take 40 mg by mouth daily. 07/09/16    Historical Provider, MD  raloxifene (EVISTA) 60 MG tablet TAKE 1 TABLET BY MOUTH  DAILY 06/03/16   Claretta Fraise, MD  traMADol (ULTRAM) 50 MG tablet Take 1 tablet (50 mg total) by mouth 4 (four) times daily as needed for moderate pain. 03/03/16   Claretta Fraise, MD    Family History Family History  Problem Relation Age of Onset  . Arthritis Sister   . Hyperlipidemia Sister   . Hypertension Sister   . Cancer Brother   . Diabetes Brother   . Heart disease Brother     Social History Social History  Substance Use Topics  . Smoking status: Never Smoker  . Smokeless tobacco: Never Used  . Alcohol use No     Allergies   Sulfa antibiotics; Codeine; Erythromycin; Ibuprofen; and Penicillins   Review of Systems Review of Systems  Gastrointestinal: Positive for abdominal pain.  Neurological: Positive for weakness.  All other systems reviewed and are negative.    Physical Exam Updated Vital Signs BP 141/68 (BP Location: Right Arm)   Pulse 85   Temp 97.5 F (36.4 C) (Oral)   Resp 20   Physical Exam  Constitutional: She is oriented to person, place, and time. She appears well-developed and well-nourished.  HENT:  Head: Normocephalic and atraumatic.  Cardiovascular: Normal rate and regular rhythm.   No murmur heard. Pulmonary/Chest: Effort normal and breath sounds normal. No respiratory distress.  Abdominal: Soft. There is no rebound and no guarding.  Moderate lower abdominal tenderness  Musculoskeletal: She exhibits no edema or tenderness.  Neurological: She is alert and oriented to person, place, and time.  Skin: Skin is warm and dry.  Psychiatric: She has a normal mood and affect. Her behavior is normal.  Nursing note and vitals reviewed.    ED Treatments / Results  Labs (all labs ordered are listed, but only abnormal results are displayed) Labs Reviewed  C DIFFICILE QUICK SCREEN W PCR REFLEX  COMPREHENSIVE METABOLIC PANEL  CBC WITH DIFFERENTIAL/PLATELET    URINALYSIS, ROUTINE W REFLEX MICROSCOPIC (NOT AT [facility])  I-STAT TROPOININ, ED    EKG  EKG Interpretation None       Radiology No results found.  Procedures Procedures (including critical care time)  Medications Ordered in ED Medications  sodium chloride 0.9 % bolus 500 mL (not administered)     Initial Impression / Assessment and Plan / ED Course  I have reviewed the triage vital signs and the nursing notes.  Pertinent labs & imaging results that were available during my care of the patient were reviewed by me and considered in my medical decision making (see chart for details).  Clinical Course     Patient here for evaluation of sudden onset abdominal pain  with diarrhea. She has significant lower abdominal tenderness on examination. She has also had profuse diarrhea in the emergency department requiring assistance multiple times. UA is concerning for UTI, will treat with antibiotics. Labs with mild leukocytosis, BUN compared to priors, concern for mild dehydration. Providing IV fluids. CT scan consistent with colitis. On repeat assessment in the emergency Department her abdominal pain has significantly improved but she does have ongoing significant diarrhea. Given her mobility issues and severe diarrhea with dehydration plan to admit for observation.  Final Clinical Impressions(s) / ED Diagnoses   Final diagnoses:  Acute UTI  Colitis    New Prescriptions New Prescriptions   No medications on file     Quintella Reichert, MD 07/15/16 [phone]

## 2016-07-14 NOTE — ED Notes (Signed)
Bed: WA08 Expected date:  Expected time:  Means of arrival:  Comments: 75 yo F/ weakness, vomiting

## 2016-07-15 ENCOUNTER — Emergency Department (HOSPITAL_COMMUNITY): Payer: Medicare Other

## 2016-07-15 DIAGNOSIS — Z809 Family history of malignant neoplasm, unspecified: Secondary | ICD-10-CM | POA: Diagnosis not present

## 2016-07-15 DIAGNOSIS — I10 Essential (primary) hypertension: Secondary | ICD-10-CM | POA: Diagnosis not present

## 2016-07-15 DIAGNOSIS — E611 Iron deficiency: Secondary | ICD-10-CM | POA: Diagnosis not present

## 2016-07-15 DIAGNOSIS — J309 Allergic rhinitis, unspecified: Secondary | ICD-10-CM | POA: Diagnosis not present

## 2016-07-15 DIAGNOSIS — Z7982 Long term (current) use of aspirin: Secondary | ICD-10-CM | POA: Diagnosis not present

## 2016-07-15 DIAGNOSIS — K559 Vascular disorder of intestine, unspecified: Secondary | ICD-10-CM | POA: Diagnosis not present

## 2016-07-15 DIAGNOSIS — K219 Gastro-esophageal reflux disease without esophagitis: Secondary | ICD-10-CM | POA: Diagnosis not present

## 2016-07-15 DIAGNOSIS — Z7401 Bed confinement status: Secondary | ICD-10-CM | POA: Diagnosis not present

## 2016-07-15 DIAGNOSIS — G629 Polyneuropathy, unspecified: Secondary | ICD-10-CM | POA: Diagnosis not present

## 2016-07-15 DIAGNOSIS — R471 Dysarthria and anarthria: Secondary | ICD-10-CM | POA: Diagnosis not present

## 2016-07-15 DIAGNOSIS — Z880 Allergy status to penicillin: Secondary | ICD-10-CM | POA: Diagnosis not present

## 2016-07-15 DIAGNOSIS — D62 Acute posthemorrhagic anemia: Secondary | ICD-10-CM | POA: Diagnosis present

## 2016-07-15 DIAGNOSIS — R109 Unspecified abdominal pain: Secondary | ICD-10-CM | POA: Diagnosis present

## 2016-07-15 DIAGNOSIS — R4701 Aphasia: Secondary | ICD-10-CM | POA: Diagnosis present

## 2016-07-15 DIAGNOSIS — R1030 Lower abdominal pain, unspecified: Secondary | ICD-10-CM | POA: Diagnosis not present

## 2016-07-15 DIAGNOSIS — R933 Abnormal findings on diagnostic imaging of other parts of digestive tract: Secondary | ICD-10-CM | POA: Diagnosis not present

## 2016-07-15 DIAGNOSIS — K921 Melena: Secondary | ICD-10-CM | POA: Diagnosis not present

## 2016-07-15 DIAGNOSIS — D649 Anemia, unspecified: Secondary | ICD-10-CM | POA: Diagnosis not present

## 2016-07-15 DIAGNOSIS — G4700 Insomnia, unspecified: Secondary | ICD-10-CM | POA: Diagnosis not present

## 2016-07-15 DIAGNOSIS — E785 Hyperlipidemia, unspecified: Secondary | ICD-10-CM | POA: Diagnosis not present

## 2016-07-15 DIAGNOSIS — Z79899 Other long term (current) drug therapy: Secondary | ICD-10-CM | POA: Diagnosis not present

## 2016-07-15 DIAGNOSIS — R197 Diarrhea, unspecified: Secondary | ICD-10-CM | POA: Diagnosis not present

## 2016-07-15 DIAGNOSIS — M6281 Muscle weakness (generalized): Secondary | ICD-10-CM | POA: Diagnosis not present

## 2016-07-15 DIAGNOSIS — Z8261 Family history of arthritis: Secondary | ICD-10-CM | POA: Diagnosis not present

## 2016-07-15 DIAGNOSIS — Z833 Family history of diabetes mellitus: Secondary | ICD-10-CM | POA: Diagnosis not present

## 2016-07-15 DIAGNOSIS — E58 Dietary calcium deficiency: Secondary | ICD-10-CM | POA: Diagnosis not present

## 2016-07-15 DIAGNOSIS — Z90710 Acquired absence of both cervix and uterus: Secondary | ICD-10-CM | POA: Diagnosis not present

## 2016-07-15 DIAGNOSIS — Z8249 Family history of ischemic heart disease and other diseases of the circulatory system: Secondary | ICD-10-CM | POA: Diagnosis not present

## 2016-07-15 DIAGNOSIS — E039 Hypothyroidism, unspecified: Secondary | ICD-10-CM | POA: Diagnosis not present

## 2016-07-15 DIAGNOSIS — K529 Noninfective gastroenteritis and colitis, unspecified: Secondary | ICD-10-CM | POA: Diagnosis present

## 2016-07-15 DIAGNOSIS — H8149 Vertigo of central origin, unspecified ear: Secondary | ICD-10-CM | POA: Diagnosis not present

## 2016-07-15 DIAGNOSIS — G319 Degenerative disease of nervous system, unspecified: Secondary | ICD-10-CM | POA: Diagnosis not present

## 2016-07-15 DIAGNOSIS — M810 Age-related osteoporosis without current pathological fracture: Secondary | ICD-10-CM | POA: Diagnosis not present

## 2016-07-15 DIAGNOSIS — N390 Urinary tract infection, site not specified: Secondary | ICD-10-CM | POA: Diagnosis not present

## 2016-07-15 DIAGNOSIS — R1311 Dysphagia, oral phase: Secondary | ICD-10-CM | POA: Diagnosis not present

## 2016-07-15 DIAGNOSIS — B9620 Unspecified Escherichia coli [E. coli] as the cause of diseases classified elsewhere: Secondary | ICD-10-CM | POA: Diagnosis present

## 2016-07-15 DIAGNOSIS — Z888 Allergy status to other drugs, medicaments and biological substances status: Secondary | ICD-10-CM | POA: Diagnosis not present

## 2016-07-15 DIAGNOSIS — R1084 Generalized abdominal pain: Secondary | ICD-10-CM | POA: Diagnosis not present

## 2016-07-15 DIAGNOSIS — Z882 Allergy status to sulfonamides status: Secondary | ICD-10-CM | POA: Diagnosis not present

## 2016-07-15 DIAGNOSIS — Z886 Allergy status to analgesic agent status: Secondary | ICD-10-CM | POA: Diagnosis not present

## 2016-07-15 DIAGNOSIS — Z993 Dependence on wheelchair: Secondary | ICD-10-CM | POA: Diagnosis not present

## 2016-07-15 DIAGNOSIS — Z9049 Acquired absence of other specified parts of digestive tract: Secondary | ICD-10-CM | POA: Diagnosis not present

## 2016-07-15 LAB — CBC
HCT: 31.1 % — ABNORMAL LOW (ref 36.0–46.0)
Hemoglobin: 10.1 g/dL — ABNORMAL LOW (ref 12.0–15.0)
MCH: 30.3 pg (ref 26.0–34.0)
MCHC: 32.5 g/dL (ref 30.0–36.0)
MCV: 93.4 fL (ref 78.0–100.0)
PLATELETS: 199 10*3/uL (ref 150–400)
RBC: 3.33 MIL/uL — AB (ref 3.87–5.11)
RDW: 14.8 % (ref 11.5–15.5)
WBC: 12.8 10*3/uL — ABNORMAL HIGH (ref 4.0–10.5)

## 2016-07-15 LAB — GASTROINTESTINAL PANEL BY PCR, STOOL (REPLACES STOOL CULTURE)

## 2016-07-15 LAB — OCCULT BLOOD, POC DEVICE: Fecal Occult Bld: POSITIVE — AB

## 2016-07-15 LAB — BASIC METABOLIC PANEL
Anion gap: 9 (ref 5–15)
BUN: 33 mg/dL — AB (ref 6–20)
CO2: 23 mmol/L (ref 22–32)
Calcium: 8.7 mg/dL — ABNORMAL LOW (ref 8.9–10.3)
Chloride: 109 mmol/L (ref 101–111)
Creatinine, Ser: 1.15 mg/dL — ABNORMAL HIGH (ref 0.44–1.00)
GFR calc Af Amer: 52 mL/min — ABNORMAL LOW (ref >60)
GFR, EST NON AFRICAN AMERICAN: 45 mL/min — AB (ref >60)
GLUCOSE: 143 mg/dL — AB (ref 65–99)
POTASSIUM: 4 mmol/L (ref 3.5–5.1)
Sodium: 141 mmol/L (ref 135–145)

## 2016-07-15 LAB — HEMOGLOBIN AND HEMATOCRIT, BLOOD
HCT: 31 % — ABNORMAL LOW (ref 36.0–46.0)
Hemoglobin: 10 g/dL — ABNORMAL LOW (ref 12.0–15.0)

## 2016-07-15 MED ORDER — ACETAMINOPHEN 325 MG PO TABS
650.0000 mg | ORAL_TABLET | Freq: Four times a day (QID) | ORAL | Status: DC | PRN
  Administered 2016-07-20: 650 mg via ORAL
  Filled 2016-07-15: qty 2

## 2016-07-15 MED ORDER — DEXTROSE 5 % IV SOLN: 1.0000 g | INTRAVENOUS | Status: DC

## 2016-07-15 MED ORDER — AMLODIPINE BESYLATE 5 MG PO TABS
5.0000 mg | ORAL_TABLET | Freq: Every day | ORAL | Status: DC
  Administered 2016-07-15: 5 mg via ORAL
  Filled 2016-07-15: qty 1

## 2016-07-15 MED ORDER — TRAMADOL HCL 50 MG PO TABS
50.0000 mg | ORAL_TABLET | Freq: Four times a day (QID) | ORAL | Status: DC | PRN
Start: 2016-07-15 — End: 2016-07-21
  Administered 2016-07-15 – 2016-07-17 (×4): 50 mg via ORAL
  Filled 2016-07-15 (×5): qty 1

## 2016-07-15 MED ORDER — HYDRALAZINE HCL 20 MG/ML IJ SOLN: 10.0000 mg | Freq: Four times a day (QID) | INTRAMUSCULAR | Status: DC | PRN

## 2016-07-15 MED ORDER — ATORVASTATIN CALCIUM 40 MG PO TABS
40.0000 mg | ORAL_TABLET | Freq: Every day | ORAL | Status: DC
  Administered 2016-07-15 – 2016-07-20 (×6): 40 mg via ORAL
  Filled 2016-07-15 (×7): qty 1

## 2016-07-15 MED ORDER — PANTOPRAZOLE SODIUM 40 MG PO TBEC
40.0000 mg | DELAYED_RELEASE_TABLET | Freq: Two times a day (BID) | ORAL | Status: DC
  Administered 2016-07-15 – 2016-07-17 (×5): 40 mg via ORAL
  Filled 2016-07-15 (×6): qty 1

## 2016-07-15 MED ORDER — ONDANSETRON HCL 4 MG PO TABS: 4.0000 mg | ORAL_TABLET | Freq: Four times a day (QID) | ORAL | Status: DC | PRN

## 2016-07-15 MED ORDER — LIP MEDEX EX OINT: TOPICAL_OINTMENT | CUTANEOUS | Status: DC | PRN

## 2016-07-15 MED ORDER — MECLIZINE HCL 25 MG PO TABS: 25.0000 mg | ORAL_TABLET | Freq: Three times a day (TID) | ORAL | Status: DC | PRN

## 2016-07-15 MED ORDER — ONDANSETRON HCL 4 MG/2ML IJ SOLN: 4.0000 mg | Freq: Four times a day (QID) | INTRAMUSCULAR | Status: DC | PRN

## 2016-07-15 MED ORDER — LEVOTHYROXINE SODIUM 50 MCG PO TABS
50.0000 ug | ORAL_TABLET | Freq: Every day | ORAL | Status: DC
  Administered 2016-07-15 – 2016-07-21 (×7): 50 ug via ORAL
  Filled 2016-07-15 (×7): qty 1

## 2016-07-15 MED ORDER — DEXTROSE 5 % IV SOLN: 1.0000 g | Freq: Once | INTRAVENOUS | Status: DC

## 2016-07-15 MED ORDER — AMLODIPINE BESYLATE 10 MG PO TABS
10.0000 mg | ORAL_TABLET | Freq: Every day | ORAL | Status: DC
  Administered 2016-07-16: 10 mg via ORAL
  Filled 2016-07-15 (×2): qty 1

## 2016-07-15 MED ORDER — ACETAMINOPHEN 650 MG RE SUPP
650.0000 mg | Freq: Four times a day (QID) | RECTAL | Status: DC | PRN
Start: 2016-07-15 — End: 2016-07-21

## 2016-07-15 MED ORDER — FERROUS SULFATE 325 (65 FE) MG PO TABS
325.0000 mg | ORAL_TABLET | Freq: Every day | ORAL | Status: DC
  Administered 2016-07-16: 325 mg via ORAL
  Filled 2016-07-15: qty 1

## 2016-07-15 MED ORDER — FLUTICASONE PROPIONATE 50 MCG/ACT NA SUSP
2.0000 | Freq: Every day | NASAL | Status: DC
  Administered 2016-07-15 – 2016-07-21 (×7): 2 via NASAL
  Filled 2016-07-15: qty 16

## 2016-07-15 MED ORDER — ALBUTEROL SULFATE (2.5 MG/3ML) 0.083% IN NEBU: 2.5000 mg | INHALATION_SOLUTION | RESPIRATORY_TRACT | Status: DC | PRN

## 2016-07-15 MED ORDER — METRONIDAZOLE IN NACL 5-0.79 MG/ML-% IV SOLN
500.0000 mg | Freq: Three times a day (TID) | INTRAVENOUS | Status: DC
  Administered 2016-07-15 – 2016-07-17 (×7): 500 mg via INTRAVENOUS
  Filled 2016-07-15 (×8): qty 100

## 2016-07-15 MED ORDER — LISINOPRIL 20 MG PO TABS
20.0000 mg | ORAL_TABLET | Freq: Every day | ORAL | Status: DC
  Administered 2016-07-15 – 2016-07-21 (×7): 20 mg via ORAL
  Filled 2016-07-15 (×6): qty 1

## 2016-07-15 MED ORDER — CIPROFLOXACIN IN D5W 400 MG/200ML IV SOLN
400.0000 mg | Freq: Two times a day (BID) | INTRAVENOUS | Status: DC
  Administered 2016-07-15 – 2016-07-17 (×5): 400 mg via INTRAVENOUS
  Filled 2016-07-15 (×5): qty 200

## 2016-07-15 MED ORDER — SODIUM CHLORIDE 0.9 % IV SOLN
INTRAVENOUS | Status: DC
  Administered 2016-07-15 – 2016-07-17 (×4): via INTRAVENOUS

## 2016-07-15 MED ORDER — DULOXETINE HCL 30 MG PO CPEP
60.0000 mg | ORAL_CAPSULE | Freq: Every day | ORAL | Status: DC
  Administered 2016-07-15 – 2016-07-21 (×7): 60 mg via ORAL
  Filled 2016-07-15 (×6): qty 2

## 2016-07-15 MED ORDER — LIP MEDEX EX OINT
TOPICAL_OINTMENT | CUTANEOUS | Status: AC
  Administered 2016-07-15: 17:00:00
  Filled 2016-07-15: qty 7

## 2016-07-15 MED ORDER — AMLODIPINE BESYLATE 10 MG PO TABS: 10.0000 mg | ORAL_TABLET | Freq: Every day | ORAL | Status: DC

## 2016-07-15 MED ORDER — PANTOPRAZOLE SODIUM 40 MG PO TBEC
40.0000 mg | DELAYED_RELEASE_TABLET | Freq: Every day | ORAL | Status: DC
  Administered 2016-07-15 – 2016-07-21 (×6): 40 mg via ORAL
  Filled 2016-07-15 (×5): qty 1

## 2016-07-15 MED ORDER — FENTANYL CITRATE (PF) 100 MCG/2ML IJ SOLN: 25.0000 ug | INTRAMUSCULAR | Status: DC | PRN

## 2016-07-15 MED ORDER — DEXTROSE 5 % IV SOLN
1.0000 g | Freq: Once | INTRAVENOUS | Status: AC
  Administered 2016-07-15: 1 g via INTRAVENOUS
  Filled 2016-07-15: qty 10

## 2016-07-15 NOTE — Consult Note (Signed)
[facility] Gastroenterology Consultation Note  Referring Provider: Dr. Denton Brick [facility]) Primary Care Physician:  Claretta Fraise, MD Primary Gastroenterologist:  Dr. Mathews Robinsons [facility])  Reason for Consultation:  hematochezia  HPI: Tamara Stein is a 75 y.o. female whom we've been asked to see for hematochezia.  Patient has history of cerebellar degeneration and is essentially bedbound.  Patient admitted for one-day history of crampy lower abdominal pain and hematochezia.  Blood in stool and hematochezia have improved since admission, but not resolved.  No black stool.  No hematemesis.  No sick contacts.  Has history of large hiatal hernia with intrathoracic stomach, had colonoscopy few years ago at [facility] that was complicated by aspiration and hospitalization.     Past Medical History:  Diagnosis Date  . Allergy   . Anxiety   . Cataract   . Cerebellar degeneration   . Chronic kidney disease   . Chronic knee pain   . Depression   . GERD (gastroesophageal reflux disease)   . Hyperlipidemia   . Hypertension   . Thyroid disease     Past Surgical History:  Procedure Laterality Date  . ABDOMINAL HYSTERECTOMY    . CHOLECYSTECTOMY    . RIGHT ELBOW      Prior to Admission medications   Medication Sig Start Date End Date Taking? Authorizing Provider  amLODipine (NORVASC) 10 MG tablet Take 1 tablet (10 mg total) by mouth daily. For blood pressure 07/01/16  Yes Eustaquio Maize, MD  aspirin EC 81 MG tablet Take 81 mg by mouth every morning.    Yes Historical Provider, MD  atorvastatin (LIPITOR) 40 MG tablet Take 1 tablet by mouth at  bedtime 01/03/16  Yes Claretta Fraise, MD  Calcium Carbonate-Vitamin D (CALTRATE 600+D PO) Take 1 tablet by mouth daily.   Yes Historical Provider, MD  diclofenac (VOLTAREN) 50 MG EC tablet Take 50-100 mg by mouth 2 (two) times daily. Take 177m in the am and then 570min the pm 07/09/16  Yes Historical Provider, MD  DULoxetine (CYMBALTA) 60 MG capsule TAKE 1  CAPSULE BY MOUTH  DAILY Patient taking differently: TAKE 6057mAPSULE BY MOUTH  DAILY 05/08/16  Yes WarClaretta FraiseD  ferrous sulfate 325 (65 FE) MG tablet Take 325 mg by mouth daily with breakfast. Reported on 02/29/2016   Yes Historical Provider, MD  fluticasone (FLONASE) 50 MCG/ACT nasal spray Place 2 sprays into both nostrils daily. Patient taking differently: Place 2 sprays into both nostrils daily as needed for allergies.  03/11/16  Yes WarClaretta FraiseD  levothyroxine (SYNTHROID, LEVOTHROID) 50 MCG tablet Take 1 tablet by mouth  daily before breakfast 01/03/16  Yes WarClaretta FraiseD  lisinopril (PRINIVIL,ZESTRIL) 20 MG tablet Take 1 tablet (20 mg total) by mouth daily. 05/19/16  Yes CarEustaquio MaizeD  meclizine (ANTIVERT) 25 MG tablet Take 1 tablet (25 mg total) by mouth 3 (three) times daily as needed for dizziness. 08/24/15  Yes WarClaretta FraiseD  Multiple Vitamin (MULTIVITAMIN WITH MINERALS) TABS tablet Take 1 tablet by mouth daily.   Yes Historical Provider, MD  omeprazole (PRILOSEC) 20 MG capsule Take 1 capsule (20 mg total) by mouth daily. 05/19/16  Yes CarEustaquio MaizeD  raloxifene (EVISTA) 60 MG tablet TAKE 1 TABLET BY MOUTH  DAILY Patient taking differently: TAKE 63m50mBLET BY MOUTH  DAILY 06/03/16  Yes WarrClaretta Fraise  traMADol (ULTRAM) 50 MG tablet Take 1 tablet (50 mg total) by mouth 4 (four) times daily as needed for moderate pain.  03/03/16  Yes Claretta Fraise, MD  ciprofloxacin (CIPRO) 250 MG tablet Take 1 tablet (250 mg total) by mouth 2 (two) times daily. Patient not taking: Reported on 07/15/2016 07/01/16   Eustaquio Maize, MD  dexlansoprazole (DEXILANT) 60 MG capsule Take 1 capsule (60 mg total) by mouth daily. On an empty stomach, then do not eat for 1 hour Patient not taking: Reported on 07/15/2016 05/09/16   Claretta Fraise, MD    Current Facility-Administered Medications  Medication Dose Route Frequency Provider Last Rate Last Dose  . 0.9 %  sodium chloride infusion    Intravenous Continuous Norval Morton, MD 75 mL/hr at 07/15/16 1049    . acetaminophen (TYLENOL) tablet 650 mg  650 mg Oral Q6H PRN Norval Morton, MD       Or  . acetaminophen (TYLENOL) suppository 650 mg  650 mg Rectal Q6H PRN Rondell A Tamala Julian, MD      . albuterol (PROVENTIL) (2.5 MG/3ML) 0.083% nebulizer solution 2.5 mg  2.5 mg Nebulization Q2H PRN Norval Morton, MD      . Derrill Memo ON 07/16/2016] amLODipine (NORVASC) tablet 10 mg  10 mg Oral Daily Courage Emokpae, MD      . ciprofloxacin (CIPRO) IVPB 400 mg  400 mg Intravenous Q12H Roxan Hockey, MD   400 mg at 07/15/16 0846  . fentaNYL (SUBLIMAZE) injection 25 mcg  25 mcg Intravenous Q2H PRN Norval Morton, MD      . fluticasone (FLONASE) 50 MCG/ACT nasal spray 2 spray  2 spray Each Nare Daily Norval Morton, MD   2 spray at 07/15/16 1049  . hydrALAZINE (APRESOLINE) injection 10 mg  10 mg Intravenous Q6H PRN Courage Emokpae, MD      . iopamidol (ISOVUE-300) 61 % injection 30 mL  30 mL Oral Once PRN Quintella Reichert, MD      . levothyroxine (SYNTHROID, LEVOTHROID) tablet 50 mcg  50 mcg Oral QAC breakfast Norval Morton, MD   50 mcg at 07/15/16 0846  . metroNIDAZOLE (FLAGYL) IVPB 500 mg  500 mg Intravenous Q8H Rondell Charmayne Sheer, MD   500 mg at 07/15/16 1258  . ondansetron (ZOFRAN) tablet 4 mg  4 mg Oral Q6H PRN Norval Morton, MD       Or  . ondansetron (ZOFRAN) injection 4 mg  4 mg Intravenous Q6H PRN Norval Morton, MD      . pantoprazole (PROTONIX) EC tablet 40 mg  40 mg Oral BID Norval Morton, MD   40 mg at 07/15/16 1047    Allergies as of 07/14/2016 - Review Complete 07/14/2016  Allergen Reaction Noted  . Sulfa antibiotics Anaphylaxis 03/25/2014  . Codeine Other (See Comments) 03/25/2014  . Erythromycin Swelling 01/21/2015  . Ibuprofen Other (See Comments) 04/26/2016  . Penicillins Diarrhea 03/25/2014    Family History  Problem Relation Age of Onset  . Arthritis Sister   . Hyperlipidemia Sister   . Hypertension Sister   .  Cancer Brother   . Diabetes Brother   . Heart disease Brother     Social History   Social History  . Marital status: Widowed    Spouse name: N/A  . Number of children: N/A  . Years of education: N/A   Occupational History  . Not on file.   Social History Main Topics  . Smoking status: Never Smoker  . Smokeless tobacco: Never Used  . Alcohol use No  . Drug use: No  . Sexual activity: No  Other Topics Concern  . Not on file   Social History Narrative  . No narrative on file    Review of Systems: Positive = bold Gen: Denies any fever, chills, rigors, night sweats, anorexia, fatigue, weakness, malaise, involuntary weight loss, and sleep disorder CV: Denies chest pain, angina, palpitations, syncope, orthopnea, PND, peripheral edema, and claudication. Resp: Denies dyspnea, cough, sputum, wheezing, coughing up blood. GI: Described in detail in HPI.    GU : Denies urinary burning, blood in urine, urinary frequency, urinary hesitancy, nocturnal urination, and urinary incontinence. MS: Denies joint pain or swelling.  Denies muscle weakness, cramps, atrophy.  Derm: Denies rash, itching, oral ulcerations, hives, unhealing ulcers.  Psych: Denies depression, anxiety, memory loss, suicidal ideation, hallucinations,  and confusion. Heme: Denies bruising, bleeding, and enlarged lymph nodes. Neuro:  Denies any headaches, dizziness, paresthesias. Endo:  Denies any problems with DM, thyroid, adrenal function.  Physical Exam: Vital signs in last 24 hours: Temp:  [97.5 F (36.4 C)-99.1 F (37.3 C)] 99.1 F (37.3 C) (12/05 0500) Pulse Rate:  [80-99] 99 (12/05 0500) Resp:  [20-24] 20 (12/05 0500) BP: (135-177)/(68-106) 135/71 (12/05 0500) SpO2:  [85 %-98 %] 98 % (12/05 0500) Last BM Date: 07/15/16 General:   Alert, awake, bradykinetic, bed-bound,overweight, NAD Head:  Normocephalic and atraumatic. Eyes:  Sclera clear, no icterus.   Conjunctiva pink. Ears:  Normal auditory  acuity. Nose:  No deformity, discharge,  or lesions. Mouth:  No deformity or lesions.  Oropharynx pink & moist. Neck:  Supple; no masses or thyromegaly. Lungs:  Clear throughout to auscultation.   No wheezes, crackles, or rhonchi. No acute distress. Heart:  Regular rate and rhythm; no murmurs, clicks, rubs,  or gallops. Abdomen:  Soft, mild lower abdominal tenderness, nondistended. No masses, hepatosplenomegaly or hernias noted. Normal bowel sounds, without guarding, and without rebound.     Msk:  Symmetrical without gross deformities. Normal posture. Pulses:  Normal pulses noted. Extremities:  Without clubbing or edema. Neurologic:  Alert and  oriented x4; diffusely weak, bedbound Skin:  Intact without significant lesions or rashes. Psych:  Alert and cooperative. Normal mood and affect.   Lab Results:  Recent Labs  07/14/16 2243 07/15/16 0605  WBC 12.0* 12.8*  HGB 10.6* 10.1*  HCT 33.3* 31.1*  PLT 200 199   BMET  Recent Labs  07/14/16 2243 07/15/16 0605  NA 143 141  K 3.6 4.0  CL 113* 109  CO2 21* 23  GLUCOSE 146* 143*  BUN 36* 33*  CREATININE 1.41* 1.15*  CALCIUM 8.8* 8.7*   LFT  Recent Labs  07/14/16 2243  PROT 6.6  ALBUMIN 3.9  AST 30  ALT 27  ALKPHOS 92  BILITOT 0.6   PT/INR No results for input(s): LABPROT, INR in the last 72 hours.  Studies/Results: Ct Abdomen Pelvis Wo Contrast  Result Date: 07/15/2016 CLINICAL DATA:  Abdominal pain and weakness.  Leukocytosis. EXAM: CT ABDOMEN AND PELVIS WITHOUT CONTRAST TECHNIQUE: Multidetector CT imaging of the abdomen and pelvis was performed following the standard protocol without IV contrast. COMPARISON:  None. 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 Hepatobiliary: No focal liver abnormality is seen. Status post cholecystectomy. No biliary dilatation. Pancreas: Unremarkable. No pancreatic ductal dilatation or surrounding inflammatory changes. Spleen: Normal in size  without focal abnormality. Adrenals/Urinary Tract: Adrenal glands are unremarkable. Kidneys are normal, without renal calculi, focal lesion, or hydronephrosis. Bladder is unremarkable. Stomach/Bowel: Abnormal mural thickening and pericolonic edema involving the entire descending colon and proximal  sigmoid. Distal transverse colon is involved to a much lesser degree. Right hemicolon is normal. Leading considerations are ischemia or colitis. No bowel obstruction. No perforation. Scant volume ascites. Vascular/Lymphatic: Aortic atherosclerosis. No enlarged abdominal or pelvic lymph nodes. Reproductive: Status post hysterectomy. No adnexal masses. Other: Small fat containing umbilical hernia. Musculoskeletal: No significant skeletal lesions. Chronic appearing mild compression deformity of L4. IMPRESSION: 1. Abnormal mural thickening and edema involving the left hemicolon. Ischemia or colitis are the leading considerations. No obstruction or perforation. 2. 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 3. Aortic atherosclerosis. 4. Small fat containing umbilical hernia. 5. Chronic appearing L4 compression deformity. Electronically Signed   By: Andreas Newport M.D.   On: 07/15/2016 01:54    Impression:  1.  Abdominal pain, lower abdominal. 2.  Hematochezia.  Suspect from colitis (on CT scan), which is in turn most likely ischemic in etiology.  Infectious and inflammatory less likely.  Picture not reminiscent of upper GI tract bleeding. 3.  Anemia, acute blood loss. 4.  Abnormal CT scan, bowel thickening left colon.  Overall constellation of findings most suspicious for ischemic colitis.  Plan:  1.  Awaiting GI pathogen panel (C. Diff studies negative). 2.  Clear liquids for now, may advance tomorrow. 3.  Daily CBCs. IV fluids.  Supportive care. 4.  Follow clinically; don't see need for colonoscopy at the present time; and would need very compelling reason to repeat colonoscopy, given prior  aspiration and hospitalization after last colonoscopy a few years ago in [place]. 5.  [facility] will follow.   LOS: 0 days   Wataru Mccowen M  07/15/2016, 1:09 PM  Pager [phone] If no answer or after 5 PM call [phone]

## 2016-07-15 NOTE — Progress Notes (Addendum)
Patient Demographics:    Tamara Stein, is a 75 y.o. female, DOB - 01/27/1941, ZLD:[id number]  Admit date - 07/14/2016   Admitting Physician Norval Morton, MD  Outpatient Primary MD for the patient is Claretta Fraise, MD  LOS - 0   Chief Complaint  Patient presents with  . Abdominal Pain  . Weakness        Subjective:    Tamara Stein today has no fevers, no emesis,  No chest pain, Patient continues to have bright red blood per rectum, denies significant abdominal pain   Assessment  & Plan :    Principal Problem:   Colitis Active Problems:   GERD (gastroesophageal reflux disease)   Hypertension   Cerebellar degeneration   Hypothyroidism   UTI (urinary tract infection)   Abdominal pain   1)Lt Sided Colitis- Infectious versus ischemic, stool Hemoccult is positive, stool for C. difficile is negative, leukocytosis persist, WBC up to 12.8 from 12, no high-grade fevers, treat empirically with IV Cipro and IV Flagyl pending cultures. Continue antiemetics , hydrate, clear liquid diet for now. GI consult  (Dr Paulita Fujita) for ongoing rectal bleeding pending. Continue to check serial H&H and transfuse as clinically indicated. Hold Diclofenac and Evista  2)Possible UTI- stool looks suspicious for UTI, but urine sample may have been contaminated, IV Cipro as above pending cultures  3) chronic anemia-H&H appears to be stable, on Protonix for heme positive stools, Hgb is currently above 10, Continue to check serial H&H and transfuse as clinically indicated  4)HTN- continue amlodipine 10 mg daily,  may use IV Hydralazine 10 mg  Every 4 hours Prn for systolic blood pressure over 160 mmhg  5) cerebellar degeneration with expressive aphasia- patient with chronic mobility issues, apparently currently at her baseline. Uses wheelchair  6)Hypothyroidism- continue levothyroxine  Consults  :  Gi.. Dr  Paulita Fujita   DVT Prophylaxis  : SCDs   Lab Results  Component Value Date   PLT 199 07/15/2016    Inpatient Medications  Scheduled Meds: . amLODipine  5 mg Oral Daily  . ciprofloxacin  400 mg Intravenous Q12H  . fluticasone  2 spray Each Nare Daily  . iopamidol      . levothyroxine  50 mcg Oral QAC breakfast  . metronidazole  500 mg Intravenous Q8H  . pantoprazole  40 mg Oral BID   Continuous Infusions: . sodium chloride 75 mL/hr at 07/15/16 0438   PRN Meds:.acetaminophen **OR** acetaminophen, albuterol, fentaNYL (SUBLIMAZE) injection, iopamidol, ondansetron **OR** ondansetron (ZOFRAN) IV    Anti-infectives    Start     Dose/Rate Route Frequency Ordered Stop   07/16/16 0000  cefTRIAXone (ROCEPHIN) 1 g in dextrose 5 % 50 mL IVPB  Status:  Discontinued     1 g 100 mL/hr over 30 Minutes Intravenous Every 24 hours 07/15/16 0332 07/15/16 0756   07/15/16 0900  ciprofloxacin (CIPRO) IVPB 400 mg     400 mg 200 mL/hr over 60 Minutes Intravenous Every 12 hours 07/15/16 0756     07/15/16 0400  metroNIDAZOLE (FLAGYL) IVPB 500 mg     500 mg 100 mL/hr over 60 Minutes Intravenous Every 8 hours 07/15/16 0332     07/15/16 0215  cefTRIAXone (ROCEPHIN) 1 g in dextrose 5 % 50  mL IVPB     1 g 100 mL/hr over 30 Minutes Intravenous  Once 07/15/16 0211 07/15/16 0255   07/15/16 0130  cefTRIAXone (ROCEPHIN) 1 g in dextrose 5 % 50 mL IVPB  Status:  Discontinued     1 g 100 mL/hr over 30 Minutes Intravenous  Once 07/15/16 0124 07/15/16 0125        Objective:   Vitals:   07/15/16 0000 07/15/16 0100 07/15/16 0200 07/15/16 0500  BP: 168/84 172/87 170/83 135/71  Pulse: 89 80 95 99  Resp: 21 21 22 20   Temp:    99.1 F (37.3 C)  TempSrc:    Oral  SpO2: 92% (!) 85% 98% 98%    Wt Readings from Last 3 Encounters:  07/01/16 91.6 kg (202 lb)  05/19/16 96.2 kg (212 lb)  04/28/16 94.9 kg (209 lb 4.8 oz)     Intake/Output Summary (Last 24 hours) at 07/15/16 0800 Last data filed at 07/15/16  0600  Gross per 24 hour  Intake              350 ml  Output                0 ml  Net              350 ml     Physical Exam  Gen:- Awake Alert,  Obese, in no acute distress HEENT:- [employer].AT, No sclera icterus Neck-Supple Neck,No JVD,.  Lungs-  CTAB  CV- S1, S2 normal Abd-  +ve B.Sounds, Abd Soft, lower abdominal tenderness without rebound or guarding, increased truncal adiposity  Extremity/Skin:- No  edema,    Rectal- bright red blood per rectum,     Data Review:   Micro Results Recent Results (from the past 240 hour(s))  C difficile quick scan w PCR reflex     Status: None   Collection Time: 07/14/16 10:00 PM  Result Value Ref Range Status   C Diff antigen NEGATIVE NEGATIVE Final   C Diff toxin NEGATIVE NEGATIVE Final   C Diff interpretation No C. difficile detected.  Final    Radiology Reports Ct Abdomen Pelvis Wo Contrast  Result Date: 07/15/2016 CLINICAL DATA:  Abdominal pain and weakness.  Leukocytosis. EXAM: CT ABDOMEN AND PELVIS WITHOUT CONTRAST TECHNIQUE: Multidetector CT imaging of the abdomen and pelvis was performed following the standard protocol without IV contrast. COMPARISON:  None. 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 Hepatobiliary: No focal liver abnormality is seen. Status post cholecystectomy. No biliary dilatation. Pancreas: Unremarkable. No pancreatic ductal dilatation or surrounding inflammatory changes. Spleen: Normal in size without focal abnormality. Adrenals/Urinary Tract: Adrenal glands are unremarkable. Kidneys are normal, without renal calculi, focal lesion, or hydronephrosis. Bladder is unremarkable. Stomach/Bowel: Abnormal mural thickening and pericolonic edema involving the entire descending colon and proximal sigmoid. Distal transverse colon is involved to a much lesser degree. Right hemicolon is normal. Leading considerations are ischemia or colitis. No bowel obstruction. No perforation. Scant volume  ascites. Vascular/Lymphatic: Aortic atherosclerosis. No enlarged abdominal or pelvic lymph nodes. Reproductive: Status post hysterectomy. No adnexal masses. Other: Small fat containing umbilical hernia. Musculoskeletal: No significant skeletal lesions. Chronic appearing mild compression deformity of L4. IMPRESSION: 1. Abnormal mural thickening and edema involving the left hemicolon. Ischemia or colitis are the leading considerations. No obstruction or perforation. 2. 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 3. Aortic atherosclerosis. 4. Small fat containing umbilical hernia. 5. Chronic appearing L4 compression deformity. Electronically Signed   By:  Andreas Newport M.D.   On: 07/15/2016 01:54     CBC  Recent Labs Lab 07/14/16 2243 07/15/16 0605  WBC 12.0* 12.8*  HGB 10.6* 10.1*  HCT 33.3* 31.1*  PLT 200 199  MCV 94.1 93.4  MCH 29.9 30.3  MCHC 31.8 32.5  RDW 14.7 14.8  LYMPHSABS 0.9  --   MONOABS 1.0  --   EOSABS 0.1  --   BASOSABS 0.0  --     Chemistries   Recent Labs Lab 07/14/16 2243  NA 143  K 3.6  CL 113*  CO2 21*  GLUCOSE 146*  BUN 36*  CREATININE 1.41*  CALCIUM 8.8*  AST 30  ALT 27  ALKPHOS 92  BILITOT 0.6   ------------------------------------------------------------------------------------------------------------------ No results for input(s): CHOL, HDL, LDLCALC, TRIG, CHOLHDL, LDLDIRECT in the last 72 hours.  Lab Results  Component Value Date   HGBA1C 5.2 05/09/2016   ------------------------------------------------------------------------------------------------------------------ No results for input(s): TSH, T4TOTAL, T3FREE, THYROIDAB in the last 72 hours.  Invalid input(s): FREET3 ------------------------------------------------------------------------------------------------------------------ No results for input(s): VITAMINB12, FOLATE, FERRITIN, TIBC, IRON, RETICCTPCT in the last 72 hours.  Coagulation profile No results for  input(s): INR, PROTIME in the last 168 hours.  No results for input(s): DDIMER in the last 72 hours.  Cardiac Enzymes No results for input(s): CKMB, TROPONINI, MYOGLOBIN in the last 168 hours.  Invalid input(s): CK ------------------------------------------------------------------------------------------------------------------    Component Value Date/Time   BNP 15.9 01/21/2015 1000     Aidenn Skellenger M.D on 07/15/2016 at 8:00 AM  Between 7am to 7pm - Pager - [phone]  After 7pm go to [email or web address] - password [email or web address]  [facility] -  Office  [phone]  Dragon dictation system was used to create this note, attempts have been made to correct errors, however presence of uncorrected errors is not a reflection quality of care provided

## 2016-07-15 NOTE — H&P (Signed)
History and Physical    Tamara Stein BWG:[id number] DOB: 1941-06-04 DOA: 07/14/2016  Referring MD/NP/PA: Dr. Ralene Bathe PCP: Claretta Fraise, MD  Patient coming from: Home  Chief Complaint: Abdominal pain  HPI: Tamara Stein is a 75 y.o. female with medical history significant of HTN, HLD, hypothyroidism, cerebellar degeneration, anxiety/depression, and GERD; who presents with complaints of crampy lower abdominal pain which started at 6 pm yesterday evening.  Reports that she had been having a bowel movement when symptoms initially as started in almost passed out while she was on the commode. Patient denies anything relieving or aggravating symptoms. Associated symptoms included nausea, vomiting, diaphoresis, and loose stools thereafter. Stools had been brown to red in color. Denies any significant chest pain or shortness of breath. Patient notes that she had slurred speech and inability to walk after being diagnosed with cerebellar degeneration.   ED Course: Upon admission to emergency department patient was evaluated and seen to be afebrile, pulse 80-95, respirations 20-24, blood pressure as high as 177/106, and O2 saturation maintained. Laboratory revealed WBC 12, hemoglobin 10.6, sodium 143, potassium 3.6, chloride 113, CO2 21, BUN 36, creatinine 1.41, glucose 146. UA showed many bacteria, small bilirubin, small leukocytes, positive nitrites, no squamous epithelial cells, and 6-30 WBCs. Stool was checked and negative for C. difficile. CT imaging of the abdomen and pelvis showed some mild thickening of the left hemicolon suggestive of possible colitis. Patient was given 1 dose of ceftriaxone IV.  Review of Systems: As per HPI otherwise 10 point review of systems negative.   Past Medical History:  Diagnosis Date  . Allergy   . Anxiety   . Cataract   . Cerebellar degeneration   . Chronic kidney disease   . Chronic knee pain   . Depression   . GERD (gastroesophageal reflux disease)   .  Hyperlipidemia   . Hypertension   . Thyroid disease     Past Surgical History:  Procedure Laterality Date  . ABDOMINAL HYSTERECTOMY    . CHOLECYSTECTOMY    . RIGHT ELBOW       reports that she has never smoked. She has never used smokeless tobacco. She reports that she does not drink alcohol or use drugs.  Allergies  Allergen Reactions  . Sulfa Antibiotics Anaphylaxis    Facial swelling   . Codeine Other (See Comments)    BLACK OUTS  . Erythromycin Swelling  . Ibuprofen Other (See Comments)    Stomach pain, muscle spasm  . Penicillins Diarrhea    Has patient had a PCN reaction causing immediate rash, facial/tongue/throat swelling, SOB or lightheadedness with hypotension: No Has patient had a PCN reaction causing severe rash involving mucus membranes or skin necrosis: No Has patient had a PCN reaction that required hospitalization Unknown Has patient had a PCN reaction occurring within the last 10 years: No If all of the above answers are "NO", then may proceed with Cephalosporin use.     Family History  Problem Relation Age of Onset  . Arthritis Sister   . Hyperlipidemia Sister   . Hypertension Sister   . Cancer Brother   . Diabetes Brother   . Heart disease Brother     Prior to Admission medications   Medication Sig Start Date End Date Taking? Authorizing Provider  amLODipine (NORVASC) 10 MG tablet Take 1 tablet (10 mg total) by mouth daily. For blood pressure Patient not taking: Reported on 07/14/2016 07/01/16   Eustaquio Maize, MD  amLODipine (NORVASC) 5 MG tablet Take 5 mg  by mouth daily. 06/12/16   Historical Provider, MD  aspirin EC 81 MG tablet Take 81 mg by mouth every morning.     Historical Provider, MD  atorvastatin (LIPITOR) 40 MG tablet Take 1 tablet by mouth at  bedtime 01/03/16   Claretta Fraise, MD  Calcium Carbonate-Vitamin D (CALTRATE 600+D PO) Take 1 tablet by mouth daily.    Historical Provider, MD  ciprofloxacin (CIPRO) 250 MG tablet Take 1 tablet  (250 mg total) by mouth 2 (two) times daily. 07/01/16   Eustaquio Maize, MD  dexlansoprazole (DEXILANT) 60 MG capsule Take 1 capsule (60 mg total) by mouth daily. On an empty stomach, then do not eat for 1 hour 05/09/16   Claretta Fraise, MD  diclofenac (VOLTAREN) 50 MG EC tablet Take 50 mg by mouth 3 (three) times daily. 07/09/16   Historical Provider, MD  DULoxetine (CYMBALTA) 60 MG capsule TAKE 1 CAPSULE BY MOUTH  DAILY 05/08/16   Claretta Fraise, MD  ferrous sulfate 325 (65 FE) MG tablet Take 325 mg by mouth daily with breakfast. Reported on 02/29/2016    Historical Provider, MD  fluticasone (FLONASE) 50 MCG/ACT nasal spray Place 2 sprays into both nostrils daily. 03/11/16   Claretta Fraise, MD  levothyroxine (SYNTHROID, LEVOTHROID) 50 MCG tablet Take 1 tablet by mouth  daily before breakfast 01/03/16   Claretta Fraise, MD  lisinopril (PRINIVIL,ZESTRIL) 20 MG tablet Take 1 tablet (20 mg total) by mouth daily. 05/19/16   Eustaquio Maize, MD  meclizine (ANTIVERT) 25 MG tablet Take 1 tablet (25 mg total) by mouth 3 (three) times daily as needed for dizziness. 08/24/15   Claretta Fraise, MD  Multiple Vitamin (MULTIVITAMIN WITH MINERALS) TABS tablet Take 1 tablet by mouth daily.    Historical Provider, MD  omeprazole (PRILOSEC) 20 MG capsule Take 1 capsule (20 mg total) by mouth daily. 05/19/16   Eustaquio Maize, MD  omeprazole (PRILOSEC) 40 MG capsule Take 40 mg by mouth daily. 07/09/16   Historical Provider, MD  raloxifene (EVISTA) 60 MG tablet TAKE 1 TABLET BY MOUTH  DAILY 06/03/16   Claretta Fraise, MD  traMADol (ULTRAM) 50 MG tablet Take 1 tablet (50 mg total) by mouth 4 (four) times daily as needed for moderate pain. 03/03/16   Claretta Fraise, MD    Physical Exam:   Constitutional: Elderly female in mild discomfort, but able to follow commands Vitals:   07/14/16 2330 07/15/16 0000 07/15/16 0100 07/15/16 0200  BP: (!) 177/106 168/84 172/87 170/83  Pulse: 91 89 80 95  Resp: 24 21 21 22   Temp:      TempSrc:       SpO2: 96% 92% (!) 85% 98%   Eyes: PERRL, lids and conjunctivae normal ENMT: Mucous membranes are moist. Posterior pharynx clear of any exudate or lesions.Normal dentition.  Neck: normal, supple, no masses, no thyromegaly Respiratory: clear to auscultation bilaterally, no wheezing, no crackles. Normal respiratory effort. No accessory muscle use.  Cardiovascular: Regular rate and rhythm, +SEM murmurs / rubs / gallops. No extremity edema. 2+ pedal pulses. No carotid bruits.  Abdomen:  Significant tenderness to palpation of the lower abdomen, no masses palpated. No hepatosplenomegaly. Bowel sounds positive.  Rectal: Chaperoned by nursing staff. External hemorrhoids present. Musculoskeletal: no clubbing / cyanosis. No joint deformity upper and lower extremities. Good ROM, no contractures. Normal muscle tone.  Skin: no rashes, lesions, ulcers. No induration Neurologic: CN 2-12 grossly intact. Slurred speech Psychiatric: Normal judgment and insight. Alert and oriented x 3. Normal  mood.     Labs on Admission: I have personally reviewed following labs and imaging studies  CBC:  Recent Labs Lab 07/14/16 2243  WBC 12.0*  NEUTROABS 10.0*  HGB 10.6*  HCT 33.3*  MCV 94.1  PLT 621   Basic Metabolic Panel:  Recent Labs Lab 07/14/16 2243  NA 143  K 3.6  CL 113*  CO2 21*  GLUCOSE 146*  BUN 36*  CREATININE 1.41*  CALCIUM 8.8*   GFR: Estimated Creatinine Clearance: 34.2 mL/min (by C-G formula based on SCr of 1.41 mg/dL (H)). Liver Function Tests:  Recent Labs Lab 07/14/16 2243  AST 30  ALT 27  ALKPHOS 92  BILITOT 0.6  PROT 6.6  ALBUMIN 3.9   No results for input(s): LIPASE, AMYLASE in the last 168 hours. No results for input(s): AMMONIA in the last 168 hours. Coagulation Profile: No results for input(s): INR, PROTIME in the last 168 hours. Cardiac Enzymes: No results for input(s): CKTOTAL, CKMB, CKMBINDEX, TROPONINI in the last 168 hours. BNP (last 3 results) No results  for input(s): PROBNP in the last 8760 hours. HbA1C: No results for input(s): HGBA1C in the last 72 hours. CBG: No results for input(s): GLUCAP in the last 168 hours. Lipid Profile: No results for input(s): CHOL, HDL, LDLCALC, TRIG, CHOLHDL, LDLDIRECT in the last 72 hours. Thyroid Function Tests: No results for input(s): TSH, T4TOTAL, FREET4, T3FREE, THYROIDAB in the last 72 hours. Anemia Panel: No results for input(s): VITAMINB12, FOLATE, FERRITIN, TIBC, IRON, RETICCTPCT in the last 72 hours. Urine analysis:    Component Value Date/Time   COLORURINE AMBER (A) 07/14/2016 2330   APPEARANCEUR CLOUDY (A) 07/14/2016 2330   APPEARANCEUR Cloudy (A) 07/01/2016 1104   LABSPEC 1.023 07/14/2016 2330   PHURINE 5.0 07/14/2016 2330   GLUCOSEU NEGATIVE 07/14/2016 2330   HGBUR NEGATIVE 07/14/2016 2330   BILIRUBINUR SMALL (A) 07/14/2016 2330   BILIRUBINUR Negative 07/01/2016 1104   KETONESUR NEGATIVE 07/14/2016 2330   PROTEINUR 100 (A) 07/14/2016 2330   UROBILINOGEN negative 09/06/2015 1155   UROBILINOGEN 0.2 03/25/2014 1359   NITRITE POSITIVE (A) 07/14/2016 2330   LEUKOCYTESUR SMALL (A) 07/14/2016 2330   LEUKOCYTESUR 1+ (A) 07/01/2016 1104   Sepsis Labs: Recent Results (from the past 240 hour(s))  C difficile quick scan w PCR reflex     Status: None   Collection Time: 07/14/16 10:00 PM  Result Value Ref Range Status   C Diff antigen NEGATIVE NEGATIVE Final   C Diff toxin NEGATIVE NEGATIVE Final   C Diff interpretation No C. difficile detected.  Final     Radiological Exams on Admission: Ct Abdomen Pelvis Wo Contrast  Result Date: 07/15/2016 CLINICAL DATA:  Abdominal pain and weakness.  Leukocytosis. EXAM: CT ABDOMEN AND PELVIS WITHOUT CONTRAST TECHNIQUE: Multidetector CT imaging of the abdomen and pelvis was performed following the standard protocol without IV contrast. COMPARISON:  None. 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 Hepatobiliary: No  focal liver abnormality is seen. Status post cholecystectomy. No biliary dilatation. Pancreas: Unremarkable. No pancreatic ductal dilatation or surrounding inflammatory changes. Spleen: Normal in size without focal abnormality. Adrenals/Urinary Tract: Adrenal glands are unremarkable. Kidneys are normal, without renal calculi, focal lesion, or hydronephrosis. Bladder is unremarkable. Stomach/Bowel: Abnormal mural thickening and pericolonic edema involving the entire descending colon and proximal sigmoid. Distal transverse colon is involved to a much lesser degree. Right hemicolon is normal. Leading considerations are ischemia or colitis. No bowel obstruction. No perforation. Scant volume ascites. Vascular/Lymphatic: Aortic atherosclerosis.  No enlarged abdominal or pelvic lymph nodes. Reproductive: Status post hysterectomy. No adnexal masses. Other: Small fat containing umbilical hernia. Musculoskeletal: No significant skeletal lesions. Chronic appearing mild compression deformity of L4. IMPRESSION: 1. Abnormal mural thickening and edema involving the left hemicolon. Ischemia or colitis are the leading considerations. No obstruction or perforation. 2. 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 3. Aortic atherosclerosis. 4. Small fat containing umbilical hernia. 5. Chronic appearing L4 compression deformity. Electronically Signed   By: Andreas Newport M.D.   On: 07/15/2016 01:54    EKG: Independently reviewed. Sinus rhythm with low voltage  Assessment/Plan Acute abdominal pain secondary to Colitis: Acute. Patient presents with acute onset of lower abdominal pain and loose stools. CT abdomen revealed evidence of left sided thickening and edema suggestive of ischemia versus infectious. Patient was given IV ceftriaxone in the ED.  - Admit to a telemetry bed - Continue antibiotics of ceftriaxone IV(because of penicillin allergy) and added on metronidazole - Check stool guaiac  - Clear liquid  diet, and advance as tolerated  - IVF NS at 75 ml/hr  UTI (urinary tract infection): Acute. Patient was seen and have positive urinalysis. Given 1 dose of Rocephin in the ED. - f/u urine culture    Leukocytosis:WBC initially 12 on admission. Suspect secondary to above. - Follow-up repeat CBC in a.m.  Anemia: Hemoglobin initially 10.6 which appears to be near patient's baseline.  - Continue to monitor blood counts  Nausea and vomiting: Acute. - Zofran prn N/V  Essential hypertension - Continue amlodipine    Cerebellar Degeneration with expressive aphasia : Chronic. Patient has old chair bound and has - Continue to monitor   Hypothyroidism - Continue levothyroxine   GERD - Pharmacy substitution of Protonix   DVT prophylaxis: SCDs Code Status: Full Family Communication:  no family present at bedside Disposition Plan: TBD  Consults called: None Admission status: Observation  Norval Morton MD [facility] Pager [phone]  If 7PM-7AM, please contact night-coverage [email or web address] Password [email or web address]  07/15/2016, 2:45 AM

## 2016-07-15 NOTE — Progress Notes (Signed)
This shift pt arrived to unit room 1513 via stretcher. VS taken, pt oriented to room and calbell with no complications. Alert and oriented. General weakness, initial assessment completed, will continue to monitor. Pt guide at the bedside

## 2016-07-16 LAB — CBC
HCT: 27.3 % — ABNORMAL LOW (ref 36.0–46.0)
HEMOGLOBIN: 8.9 g/dL — AB (ref 12.0–15.0)
MCH: 29.7 pg (ref 26.0–34.0)
MCHC: 32.6 g/dL (ref 30.0–36.0)
MCV: 91 fL (ref 78.0–100.0)
PLATELETS: 173 10*3/uL (ref 150–400)
RBC: 3 MIL/uL — AB (ref 3.87–5.11)
RDW: 14.6 % (ref 11.5–15.5)
WBC: 13.1 10*3/uL — ABNORMAL HIGH (ref 4.0–10.5)

## 2016-07-16 LAB — BASIC METABOLIC PANEL
Anion gap: 6 (ref 5–15)
BUN: 22 mg/dL — AB (ref 6–20)
CHLORIDE: 109 mmol/L (ref 101–111)
CO2: 23 mmol/L (ref 22–32)
CREATININE: 1.15 mg/dL — AB (ref 0.44–1.00)
Calcium: 7.9 mg/dL — ABNORMAL LOW (ref 8.9–10.3)
GFR, EST AFRICAN AMERICAN: 52 mL/min — AB (ref >60)
GFR, EST NON AFRICAN AMERICAN: 45 mL/min — AB (ref >60)
Glucose, Bld: 111 mg/dL — ABNORMAL HIGH (ref 65–99)
POTASSIUM: 3.3 mmol/L — AB (ref 3.5–5.1)
SODIUM: 138 mmol/L (ref 135–145)

## 2016-07-16 LAB — HEMOGLOBIN AND HEMATOCRIT, BLOOD
HEMATOCRIT: 26.6 % — AB (ref 36.0–46.0)
HEMOGLOBIN: 8.6 g/dL — AB (ref 12.0–15.0)

## 2016-07-16 MED ORDER — ZOLPIDEM TARTRATE 5 MG PO TABS
5.0000 mg | ORAL_TABLET | Freq: Every evening | ORAL | Status: DC | PRN
  Administered 2016-07-18 – 2016-07-20 (×4): 5 mg via ORAL
  Filled 2016-07-16 (×4): qty 1

## 2016-07-16 MED ORDER — POTASSIUM CHLORIDE CRYS ER 20 MEQ PO TBCR
40.0000 meq | EXTENDED_RELEASE_TABLET | Freq: Once | ORAL | Status: AC
  Administered 2016-07-16: 40 meq via ORAL
  Filled 2016-07-16: qty 2

## 2016-07-16 MED ORDER — POTASSIUM CHLORIDE CRYS ER 20 MEQ PO TBCR
40.0000 meq | EXTENDED_RELEASE_TABLET | Freq: Once | ORAL | Status: AC
Start: 2016-07-16 — End: 2016-07-16
  Administered 2016-07-16: 40 meq via ORAL
  Filled 2016-07-16: qty 2

## 2016-07-16 MED ORDER — FERROUS SULFATE 325 (65 FE) MG PO TABS
325.0000 mg | ORAL_TABLET | Freq: Two times a day (BID) | ORAL | Status: DC
  Administered 2016-07-16 – 2016-07-21 (×10): 325 mg via ORAL
  Filled 2016-07-16 (×10): qty 1

## 2016-07-16 NOTE — Progress Notes (Signed)
Patient Demographics:    Tamara Stein, is a 75 y.o. female, DOB - 05-03-1941, XHF:[id number]  Admit date - 07/14/2016   Admitting Physician Norval Morton, MD  Outpatient Primary MD for the patient is Claretta Fraise, MD  LOS - 1   Chief Complaint  Patient presents with  . Abdominal Pain  . Weakness        Subjective:    [name] today has no fevers, no emesis,  No chest pain,  Episode of bloody stool   Assessment  & Plan :    Principal Problem:   Colitis Active Problems:   GERD (gastroesophageal reflux disease)   Hypertension   Cerebellar degeneration   Hypothyroidism   UTI (urinary tract infection)   Abdominal pain   1)Lt Sided Colitis- As per GI consult, ischemic colitis is favored over infectious colitis, GI pathogen test stool for C. difficile and negative, patient currently on Cipro and Flagyl empirically. Leukocytosis persist with a white count of 13,000, no fevers or chills. Hematochezia noted, no melena or hematemesis. GI (dr Paulita Fujita) would like to defer endoluminal evaluation at this time. Continue clear liquid diet  2)Acute on Chronic anemia-secondary to GI blood loss, hemoglobin is down to 8.9, giving ongoing bleeding, transfuse if hemoglobin below 8 or if patient becomes symptomatic  3)HTN-stable, continue lisinopril 20 mg daily along with amlodipine 10 mg daily,  may use IV Hydralazine 10 mg  Every 4 hours Prn for systolic blood pressure over 160 mmhg  4)E Coli UTI- continue Cipro as above in #1 pending final  sensitivity  5)Cerebellar Degeneration- patient is wheelchair and bedbound at baseline  6) hypothyroidism- continue levothyroxine  7)Dispo- unable to discharge home as hemoglobin is trending down And WBC slightly up, may discharge home if H&H is stable, and once final sensitivity of the urine cultures are available   Consults  :  Gi (Dr Paulita Fujita)   DVT  Prophylaxis  :  SCD*  Lab Results  Component Value Date   PLT 173 07/16/2016    Inpatient Medications  Scheduled Meds: . amLODipine  10 mg Oral Daily  . atorvastatin  40 mg Oral QHS  . ciprofloxacin  400 mg Intravenous Q12H  . DULoxetine  60 mg Oral Daily  . ferrous sulfate  325 mg Oral BID WC  . fluticasone  2 spray Each Nare Daily  . levothyroxine  50 mcg Oral QAC breakfast  . lisinopril  20 mg Oral Daily  . metronidazole  500 mg Intravenous Q8H  . pantoprazole  40 mg Oral BID  . pantoprazole  40 mg Oral Daily  . potassium chloride  40 mEq Oral Once   Continuous Infusions: . sodium chloride 75 mL/hr at 07/16/16 1414   PRN Meds:.acetaminophen **OR** acetaminophen, albuterol, fentaNYL (SUBLIMAZE) injection, hydrALAZINE, iopamidol, lip balm, meclizine, ondansetron **OR** ondansetron (ZOFRAN) IV, traMADol    Anti-infectives    Start     Dose/Rate Route Frequency Ordered Stop   07/16/16 0000  cefTRIAXone (ROCEPHIN) 1 g in dextrose 5 % 50 mL IVPB  Status:  Discontinued     1 g 100 mL/hr over 30 Minutes Intravenous Every 24 hours 07/15/16 0332 07/15/16 0756   07/15/16 0900  ciprofloxacin (CIPRO) IVPB 400 mg     400  mg 200 mL/hr over 60 Minutes Intravenous Every 12 hours 07/15/16 0756     07/15/16 0400  metroNIDAZOLE (FLAGYL) IVPB 500 mg     500 mg 100 mL/hr over 60 Minutes Intravenous Every 8 hours 07/15/16 0332     07/15/16 0215  cefTRIAXone (ROCEPHIN) 1 g in dextrose 5 % 50 mL IVPB     1 g 100 mL/hr over 30 Minutes Intravenous  Once 07/15/16 0211 07/15/16 0255   07/15/16 0130  cefTRIAXone (ROCEPHIN) 1 g in dextrose 5 % 50 mL IVPB  Status:  Discontinued     1 g 100 mL/hr over 30 Minutes Intravenous  Once 07/15/16 0124 07/15/16 0125        Objective:   Vitals:   07/15/16 1300 07/15/16 2016 07/16/16 0516 07/16/16 1443  BP: 127/73 (!) 147/73 126/66 (!) 115/59  Pulse: 88 (!) 101 88 87  Resp: 18 18 18  (!) 22  Temp: 97.9 F (36.6 C) 98.7 F (37.1 C) 98.2 F (36.8 C)  98.8 F (37.1 C)  TempSrc: Oral Oral Oral Oral  SpO2: 97% 99% 96% 98%    Wt Readings from Last 3 Encounters:  07/01/16 91.6 kg (202 lb)  05/19/16 96.2 kg (212 lb)  04/28/16 94.9 kg (209 lb 4.8 oz)     Intake/Output Summary (Last 24 hours) at 07/16/16 1557 Last data filed at 07/15/16 1700  Gross per 24 hour  Intake              240 ml  Output                0 ml  Net              240 ml     Physical Exam  Gen:- Awake Alert,  In no apparent distress , obese HEENT:- [place].AT, No sclera icterus Neck-Supple Neck,No JVD,.  Lungs-  CTAB  CV- S1, S2 normal Abd-  +ve B.Sounds, Abd Soft, increased truncal adiposity, lower abdominal tenderness,   worse in the left lower quadrant without rebound or guarding, no CVA area tenderness Extremity/Skin:- No  edema,   bed and wheelchair-bound at baseline Neuro- baseline speech deficits (not new)   Data Review:   Micro Results Recent Results (from the past 240 hour(s))  C difficile quick scan w PCR reflex     Status: None   Collection Time: 07/14/16 10:00 PM  Result Value Ref Range Status   C Diff antigen NEGATIVE NEGATIVE Final   C Diff toxin NEGATIVE NEGATIVE Final   C Diff interpretation No C. difficile detected.  Final  Urine culture     Status: Abnormal (Preliminary result)   Collection Time: 07/14/16 11:30 PM  Result Value Ref Range Status   Specimen Description URINE, RANDOM  Final   Special Requests NONE  Final   Culture >=100,000 COLONIES/mL ESCHERICHIA COLI (A)  Final   Report Status PENDING  Incomplete  Gastrointestinal Panel by PCR , Stool     Status: None   Collection Time: 07/15/16 10:20 AM  Result Value Ref Range Status   Campylobacter species NOT DETECTED NOT DETECTED Final   Plesimonas shigelloides NOT DETECTED NOT DETECTED Final   Salmonella species NOT DETECTED NOT DETECTED Final   Yersinia enterocolitica NOT DETECTED NOT DETECTED Final   Vibrio species NOT DETECTED NOT DETECTED Final   Vibrio cholerae NOT  DETECTED NOT DETECTED Final   Enteroaggregative E coli (EAEC) NOT DETECTED NOT DETECTED Final   Enteropathogenic E coli (EPEC) NOT DETECTED NOT DETECTED  Final   Enterotoxigenic E coli (ETEC) NOT DETECTED NOT DETECTED Final   Shiga like toxin producing E coli (STEC) NOT DETECTED NOT DETECTED Final   Shigella/Enteroinvasive E coli (EIEC) NOT DETECTED NOT DETECTED Final   Cryptosporidium NOT DETECTED NOT DETECTED Final   Cyclospora cayetanensis NOT DETECTED NOT DETECTED Final   Entamoeba histolytica NOT DETECTED NOT DETECTED Final   Giardia lamblia NOT DETECTED NOT DETECTED Final   Adenovirus F40/41 NOT DETECTED NOT DETECTED Final   Astrovirus NOT DETECTED NOT DETECTED Final   Norovirus GI/GII NOT DETECTED NOT DETECTED Final   Rotavirus A NOT DETECTED NOT DETECTED Final   Sapovirus (I, II, IV, and V) NOT DETECTED NOT DETECTED Final    Radiology Reports Ct Abdomen Pelvis Wo Contrast  Result Date: 07/15/2016 CLINICAL DATA:  Abdominal pain and weakness.  Leukocytosis. EXAM: CT ABDOMEN AND PELVIS WITHOUT CONTRAST TECHNIQUE: Multidetector CT imaging of the abdomen and pelvis was performed following the standard protocol without IV contrast. COMPARISON:  None. 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 Hepatobiliary: No focal liver abnormality is seen. Status post cholecystectomy. No biliary dilatation. Pancreas: Unremarkable. No pancreatic ductal dilatation or surrounding inflammatory changes. Spleen: Normal in size without focal abnormality. Adrenals/Urinary Tract: Adrenal glands are unremarkable. Kidneys are normal, without renal calculi, focal lesion, or hydronephrosis. Bladder is unremarkable. Stomach/Bowel: Abnormal mural thickening and pericolonic edema involving the entire descending colon and proximal sigmoid. Distal transverse colon is involved to a much lesser degree. Right hemicolon is normal. Leading considerations are ischemia or colitis. No bowel  obstruction. No perforation. Scant volume ascites. Vascular/Lymphatic: Aortic atherosclerosis. No enlarged abdominal or pelvic lymph nodes. Reproductive: Status post hysterectomy. No adnexal masses. Other: Small fat containing umbilical hernia. Musculoskeletal: No significant skeletal lesions. Chronic appearing mild compression deformity of L4. IMPRESSION: 1. Abnormal mural thickening and edema involving the left hemicolon. Ischemia or colitis are the leading considerations. No obstruction or perforation. 2. 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 3. Aortic atherosclerosis. 4. Small fat containing umbilical hernia. 5. Chronic appearing L4 compression deformity. Electronically Signed   By: Andreas Newport M.D.   On: 07/15/2016 01:54     CBC  Recent Labs Lab 07/14/16 2243 07/15/16 0605 07/15/16 1705 07/16/16 0511 07/16/16 1349  WBC 12.0* 12.8*  --  13.1*  --   HGB 10.6* 10.1* 10.0* 8.9* 8.6*  HCT 33.3* 31.1* 31.0* 27.3* 26.6*  PLT 200 199  --  173  --   MCV 94.1 93.4  --  91.0  --   MCH 29.9 30.3  --  29.7  --   MCHC 31.8 32.5  --  32.6  --   RDW 14.7 14.8  --  14.6  --   LYMPHSABS 0.9  --   --   --   --   MONOABS 1.0  --   --   --   --   EOSABS 0.1  --   --   --   --   BASOSABS 0.0  --   --   --   --     Chemistries   Recent Labs Lab 07/14/16 2243 07/15/16 0605 07/16/16 0511  NA 143 141 138  K 3.6 4.0 3.3*  CL 113* 109 109  CO2 21* 23 23  GLUCOSE 146* 143* 111*  BUN 36* 33* 22*  CREATININE 1.41* 1.15* 1.15*  CALCIUM 8.8* 8.7* 7.9*  AST 30  --   --   ALT 27  --   --  ALKPHOS 92  --   --   BILITOT 0.6  --   --    ------------------------------------------------------------------------------------------------------------------ No results for input(s): CHOL, HDL, LDLCALC, TRIG, CHOLHDL, LDLDIRECT in the last 72 hours.  Lab Results  Component Value Date   HGBA1C 5.2 05/09/2016    ------------------------------------------------------------------------------------------------------------------ No results for input(s): TSH, T4TOTAL, T3FREE, THYROIDAB in the last 72 hours.  Invalid input(s): FREET3 ------------------------------------------------------------------------------------------------------------------ No results for input(s): VITAMINB12, FOLATE, FERRITIN, TIBC, IRON, RETICCTPCT in the last 72 hours.  Coagulation profile No results for input(s): INR, PROTIME in the last 168 hours.  No results for input(s): DDIMER in the last 72 hours.  Cardiac Enzymes No results for input(s): CKMB, TROPONINI, MYOGLOBIN in the last 168 hours.  Invalid input(s): CK ------------------------------------------------------------------------------------------------------------------    Component Value Date/Time   BNP 15.9 01/21/2015 1000     Mayana Irigoyen M.D on 07/16/2016 at 3:57 PM  Between 7am to 7pm - Pager - [phone]  After 7pm go to [email or web address] - password [email or web address]  [facility] -  Office  [phone]  Dragon dictation system was used to create this note, attempts have been made to correct errors, however presence of uncorrected errors is not a reflection quality of care provided

## 2016-07-16 NOTE — Progress Notes (Signed)
Subjective: Abdominal pain improving. Few bloody stools and clots over the past 24 hours.  Objective: Vital signs in last 24 hours: Temp:  [97.9 F (36.6 C)-98.7 F (37.1 C)] 98.2 F (36.8 C) (12/06 0516) Pulse Rate:  [88-101] 88 (12/06 0516) Resp:  [18] 18 (12/06 0516) BP: (126-147)/(66-73) 126/66 (12/06 0516) SpO2:  [96 %-99 %] 96 % (12/06 0516) Weight change:  Last BM Date: 07/15/16  PE: GEN:  NAD, overweight, expressive aphasia ABD:  Protuberant, soft, non-tender  Lab Results: CBC    Component Value Date/Time   WBC 13.1 (H) 07/16/2016 0511   RBC 3.00 (L) 07/16/2016 0511   HGB 8.9 (L) 07/16/2016 0511   HCT 27.3 (L) 07/16/2016 0511   HCT 34.1 05/09/2016 1640   PLT 173 07/16/2016 0511   PLT 234 05/09/2016 1640   MCV 91.0 07/16/2016 0511   MCV 94 05/09/2016 1640   MCH 29.7 07/16/2016 0511   MCHC 32.6 07/16/2016 0511   RDW 14.6 07/16/2016 0511   RDW 15.8 (H) 05/09/2016 1640   LYMPHSABS 0.9 07/14/2016 2243   LYMPHSABS 1.7 05/09/2016 1640   MONOABS 1.0 07/14/2016 2243   EOSABS 0.1 07/14/2016 2243   EOSABS 0.4 05/09/2016 1640   BASOSABS 0.0 07/14/2016 2243   BASOSABS 0.1 05/09/2016 1640   CMP     Component Value Date/Time   NA 138 07/16/2016 0511   NA 147 (H) 05/09/2016 1640   K 3.3 (L) 07/16/2016 0511   CL 109 07/16/2016 0511   CO2 23 07/16/2016 0511   GLUCOSE 111 (H) 07/16/2016 0511   BUN 22 (H) 07/16/2016 0511   BUN 26 05/09/2016 1640   CREATININE 1.15 (H) 07/16/2016 0511   CALCIUM 7.9 (L) 07/16/2016 0511   PROT 6.6 07/14/2016 2243   PROT 6.3 05/09/2016 1640   ALBUMIN 3.9 07/14/2016 2243   ALBUMIN 3.9 05/09/2016 1640   AST 30 07/14/2016 2243   ALT 27 07/14/2016 2243   ALKPHOS 92 07/14/2016 2243   BILITOT 0.6 07/14/2016 2243   BILITOT 0.2 05/09/2016 1640   GFRNONAA 45 (L) 07/16/2016 0511   GFRAA 52 (L) 07/16/2016 0511   GI pathogen panel negative  Assessment:  1.  Abdominal pain, lower abdominal, improving. 2.  Hematochezia.  Suspect from  colitis (on CT scan), which is in turn most likely ischemic in etiology.  Infectious and inflammatory less likely.  Picture not reminiscent of upper GI tract bleeding. 3.  Anemia, acute blood loss, downtrend from yesterday. No rampant bleeding.  Most of decrease in Hgb is likely equilibrative changes. 4.  Abnormal CT scan, bowel thickening left colon.  Overall constellation of findings most suspicious for ischemic colitis.  Plan:  1.  Advance diet to full liquids. 2.  Continue antibiotics. 3.  Would try to get patient OOBTC as possible. 4.  Follow serial CBCs and continue IV fluids. 5.  [facility] will follow.   Landry Dyke 07/16/2016, 8:24 AM   Pager [phone] If no answer or after 5 PM call [phone]

## 2016-07-17 LAB — CBC
HCT: 25.9 % — ABNORMAL LOW (ref 36.0–46.0)
HCT: 28.8 % — ABNORMAL LOW (ref 36.0–46.0)
Hemoglobin: 8.2 g/dL — ABNORMAL LOW (ref 12.0–15.0)
Hemoglobin: 9.1 g/dL — ABNORMAL LOW (ref 12.0–15.0)
MCH: 29.5 pg (ref 26.0–34.0)
MCH: 29.6 pg (ref 26.0–34.0)
MCHC: 31.7 g/dL (ref 30.0–36.0)
MCHC: 31.6 g/dL (ref 30.0–36.0)
MCV: 93.2 fL (ref 78.0–100.0)
MCV: 93.8 fL (ref 78.0–100.0)
PLATELETS: 154 10*3/uL (ref 150–400)
PLATELETS: 165 10*3/uL (ref 150–400)
RBC: 2.78 MIL/uL — ABNORMAL LOW (ref 3.87–5.11)
RBC: 3.07 MIL/uL — ABNORMAL LOW (ref 3.87–5.11)
RDW: 15.1 % (ref 11.5–15.5)
RDW: 15.2 % (ref 11.5–15.5)
WBC: 8.9 10*3/uL (ref 4.0–10.5)
WBC: 8.1 10*3/uL (ref 4.0–10.5)

## 2016-07-17 LAB — COMPREHENSIVE METABOLIC PANEL
ALT: 29 U/L (ref 14–54)
ANION GAP: 8 (ref 5–15)
AST: 35 U/L (ref 15–41)
Albumin: 3.3 g/dL — ABNORMAL LOW (ref 3.5–5.0)
Alkaline Phosphatase: 62 U/L (ref 38–126)
BUN: 22 mg/dL — ABNORMAL HIGH (ref 6–20)
CHLORIDE: 110 mmol/L (ref 101–111)
CO2: 18 mmol/L — ABNORMAL LOW (ref 22–32)
Calcium: 7.8 mg/dL — ABNORMAL LOW (ref 8.9–10.3)
Creatinine, Ser: 1.51 mg/dL — ABNORMAL HIGH (ref 0.44–1.00)
GFR, EST AFRICAN AMERICAN: 38 mL/min — AB (ref >60)
GFR, EST NON AFRICAN AMERICAN: 32 mL/min — AB (ref >60)
Glucose, Bld: 131 mg/dL — ABNORMAL HIGH (ref 65–99)
POTASSIUM: 4.5 mmol/L (ref 3.5–5.1)
Sodium: 136 mmol/L (ref 135–145)
Total Bilirubin: 0.6 mg/dL (ref 0.3–1.2)
Total Protein: 5.9 g/dL — ABNORMAL LOW (ref 6.5–8.1)

## 2016-07-17 LAB — URINE CULTURE

## 2016-07-17 LAB — BASIC METABOLIC PANEL
Anion gap: 6 (ref 5–15)
BUN: 22 mg/dL — AB (ref 6–20)
CALCIUM: 7.5 mg/dL — AB (ref 8.9–10.3)
CO2: 21 mmol/L — ABNORMAL LOW (ref 22–32)
CREATININE: 1.48 mg/dL — AB (ref 0.44–1.00)
Chloride: 111 mmol/L (ref 101–111)
GFR calc Af Amer: 38 mL/min — ABNORMAL LOW (ref >60)
GFR, EST NON AFRICAN AMERICAN: 33 mL/min — AB (ref >60)
GLUCOSE: 105 mg/dL — AB (ref 65–99)
POTASSIUM: 4.4 mmol/L (ref 3.5–5.1)
SODIUM: 138 mmol/L (ref 135–145)

## 2016-07-17 LAB — TROPONIN I

## 2016-07-17 LAB — GLUCOSE, CAPILLARY: Glucose-Capillary: 128 mg/dL — ABNORMAL HIGH (ref 65–99)

## 2016-07-17 LAB — LACTIC ACID, PLASMA: LACTIC ACID, VENOUS: 0.9 mmol/L (ref 0.5–1.9)

## 2016-07-17 MED ORDER — DICLOFENAC SODIUM 1 % TD GEL
2.0000 g | Freq: Four times a day (QID) | TRANSDERMAL | Status: DC
  Administered 2016-07-17 – 2016-07-21 (×11): 2 g via TOPICAL
  Filled 2016-07-17: qty 100

## 2016-07-17 MED ORDER — HYDRALAZINE HCL 20 MG/ML IJ SOLN: 5.0000 mg | Freq: Four times a day (QID) | INTRAMUSCULAR | Status: DC | PRN

## 2016-07-17 MED ORDER — CIPROFLOXACIN HCL 500 MG PO TABS
500.0000 mg | ORAL_TABLET | Freq: Two times a day (BID) | ORAL | Status: DC
  Administered 2016-07-17 – 2016-07-21 (×8): 500 mg via ORAL
  Filled 2016-07-17 (×10): qty 1

## 2016-07-17 MED ORDER — SODIUM CHLORIDE 0.9 % IV SOLN: INTRAVENOUS | Status: DC

## 2016-07-17 MED ORDER — METRONIDAZOLE 500 MG PO TABS
500.0000 mg | ORAL_TABLET | Freq: Two times a day (BID) | ORAL | Status: DC
  Administered 2016-07-17: 500 mg via ORAL
  Filled 2016-07-17: qty 1

## 2016-07-17 NOTE — Progress Notes (Addendum)
Patient Demographics:    Tamara Stein, is a 75 y.o. female, DOB - 1941-01-05, MKL:[id number]  Admit date - 07/14/2016   Admitting Physician Norval Morton, MD  Outpatient Primary MD for the patient is Claretta Fraise, MD  LOS - 2   Chief Complaint  Patient presents with  . Abdominal Pain  . Weakness        Subjective:    Tamara Stein today has no fevers, no emesis,  No chest pain. Later called that pt was complaining of dizziness, she was lying in bed and had abdominal pain at the same time. Both of which resolved by the time of my re-eval. Vitals taken at that time stable. No chest pain, SOB or weakness of her extremities. Stat labs ordered.   Assessment  & Plan :    Principal Problem:   Colitis Active Problems:   GERD (gastroesophageal reflux disease)   Hypertension   Cerebellar degeneration   Hypothyroidism   UTI (urinary tract infection)   Abdominal pain   1) Lt Sided Colitis- As per GI consult, ischemic colitis is favored over infectious colitis, GI pathogen panel- negative. On Cipro and Flagyl empirically. Leukocytosis resolved, 8.9 from 13,000, no fevers or chills. Hematochezia noted- last BM 12/6, bloody, no melena or hematemesis. GI (dr Paulita Fujita) would like to defer endoluminal evaluation at this time.  - Change diet to soft, change antibiotics - metronidazole and cipro to PO. - Cont IVF N/s 75cc/hr for 1 more day. - Pending GI eval today, advance diet. - Daily PPI  2)Acute on Chronic anemia-secondary to GI blood loss, hemoglobin is now stable at 8.2, from 10, giving ongoing bleeding, transfuse if hemoglobin below 8 or if patient becomes symptomatic. - Cbc am  3)HTN-stable, continue lisinopril 20 mg daily. - Will hold Norvasc for with BP- 114/42, and ongoing GI bleed.  -  may use IV Hydralazine 5 mg  Every 4 hours Prn for systolic blood pressure over 160 mmhg  4)E Coli  UTI- continue Cipro. Urine culture sensitive to cipro.  5)Cerebellar Degeneration- patient is wheelchair and bedbound at baseline. Stable.  6) hypothyroidism- continue levothyroxine  7) Dizziness- ?related to pain, Lasted a few mins. Resolved. BP rechecked- 151/66 - Orthostatic vitals - Check stat HGB- stable 9.1 - Bmet- Cr- 1.5, baseline- 1.2- 1.4.  - IVF n/s 75 cc/hr - LActic Acid- stat- 0.9. - trop X1- negative.  Disposition Plan  : unable to discharge home as last BM 12/6 was bloody, though Hgb stable at 9. Likely D/c tomorrow, to home, pt says she has enough support at home. No SNF.  Consults  : GI  DVT Prophylaxis  :  Lovenox - Heparin - SCDs   Lab Results  Component Value Date   PLT 165 07/17/2016    Inpatient Medications  Scheduled Meds: . amLODipine  10 mg Oral Daily  . atorvastatin  40 mg Oral QHS  . ciprofloxacin  400 mg Intravenous Q12H  . DULoxetine  60 mg Oral Daily  . ferrous sulfate  325 mg Oral BID WC  . fluticasone  2 spray Each Nare Daily  . levothyroxine  50 mcg Oral QAC breakfast  . lisinopril  20 mg Oral Daily  . metronidazole  500 mg Intravenous Q8H  .  pantoprazole  40 mg Oral BID  . pantoprazole  40 mg Oral Daily   Continuous Infusions: . sodium chloride 75 mL/hr at 07/17/16 0728   PRN Meds:.acetaminophen **OR** acetaminophen, albuterol, fentaNYL (SUBLIMAZE) injection, hydrALAZINE, iopamidol, lip balm, meclizine, ondansetron **OR** ondansetron (ZOFRAN) IV, traMADol, zolpidem    Anti-infectives    Start     Dose/Rate Route Frequency Ordered Stop   07/16/16 0000  cefTRIAXone (ROCEPHIN) 1 g in dextrose 5 % 50 mL IVPB  Status:  Discontinued     1 g 100 mL/hr over 30 Minutes Intravenous Every 24 hours 07/15/16 0332 07/15/16 0756   07/15/16 0900  ciprofloxacin (CIPRO) IVPB 400 mg     400 mg 200 mL/hr over 60 Minutes Intravenous Every 12 hours 07/15/16 0756     07/15/16 0400  metroNIDAZOLE (FLAGYL) IVPB 500 mg     500 mg 100 mL/hr over 60  Minutes Intravenous Every 8 hours 07/15/16 0332     07/15/16 0215  cefTRIAXone (ROCEPHIN) 1 g in dextrose 5 % 50 mL IVPB     1 g 100 mL/hr over 30 Minutes Intravenous  Once 07/15/16 0211 07/15/16 0255   07/15/16 0130  cefTRIAXone (ROCEPHIN) 1 g in dextrose 5 % 50 mL IVPB  Status:  Discontinued     1 g 100 mL/hr over 30 Minutes Intravenous  Once 07/15/16 0124 07/15/16 0125        Objective:   Vitals:   07/16/16 2049 07/17/16 0510 07/17/16 1013 07/17/16 1109  BP: (!) 159/58 (!) 114/42 135/60 (!) 151/66  Pulse: 94 85 85 84  Resp: 20 20  20   Temp: 98 F (36.7 C) 98.3 F (36.8 C)  98.2 F (36.8 C)  TempSrc: Oral Oral  Oral  SpO2: 98% 98%  98%    Wt Readings from Last 3 Encounters:  07/01/16 91.6 kg (202 lb)  05/19/16 96.2 kg (212 lb)  04/28/16 94.9 kg (209 lb 4.8 oz)     Intake/Output Summary (Last 24 hours) at 07/17/16 1200 Last data filed at 07/17/16 0900  Gross per 24 hour  Intake              120 ml  Output                0 ml  Net              120 ml     Physical Exam  Gen:- Awake Alert, co-op, pleasant, hard to understand speech, normal for her. HEENT:- [place].AT, No scleral icterus Neck-Supple Neck,.  Lungs-  CTAB, no wheeze or crackles CV- S1, S2 normal, regular Abd-  +ve B.Sounds, Abd Soft, tenderness- mild epigastric and LLQ Extremity/Skin:- No  Edema, SCds, warm, dry    Data Review:   Micro Results Recent Results (from the past 240 hour(s))  C difficile quick scan w PCR reflex     Status: None   Collection Time: 07/14/16 10:00 PM  Result Value Ref Range Status   C Diff antigen NEGATIVE NEGATIVE Final   C Diff toxin NEGATIVE NEGATIVE Final   C Diff interpretation No C. difficile detected.  Final  Urine culture     Status: Abnormal   Collection Time: 07/14/16 11:30 PM  Result Value Ref Range Status   Specimen Description URINE, RANDOM  Final   Special Requests NONE  Final   Culture >=100,000 COLONIES/mL ESCHERICHIA COLI (A)  Final   Report Status  07/17/2016 FINAL  Final   Organism ID, Bacteria ESCHERICHIA COLI (A)  Final      Susceptibility   Escherichia coli - MIC*    AMPICILLIN 4 SENSITIVE Sensitive     CEFAZOLIN <=4 SENSITIVE Sensitive     CEFTRIAXONE <=1 SENSITIVE Sensitive     CIPROFLOXACIN <=0.25 SENSITIVE Sensitive     GENTAMICIN >=16 RESISTANT Resistant     IMIPENEM <=0.25 SENSITIVE Sensitive     NITROFURANTOIN <=16 SENSITIVE Sensitive     TRIMETH/SULFA >=320 RESISTANT Resistant     AMPICILLIN/SULBACTAM 4 SENSITIVE Sensitive     PIP/TAZO <=4 SENSITIVE Sensitive     Extended ESBL NEGATIVE Sensitive     * >=100,000 COLONIES/mL ESCHERICHIA COLI  Gastrointestinal Panel by PCR , Stool     Status: None   Collection Time: 07/15/16 10:20 AM  Result Value Ref Range Status   Campylobacter species NOT DETECTED NOT DETECTED Final   Plesimonas shigelloides NOT DETECTED NOT DETECTED Final   Salmonella species NOT DETECTED NOT DETECTED Final   Yersinia enterocolitica NOT DETECTED NOT DETECTED Final   Vibrio species NOT DETECTED NOT DETECTED Final   Vibrio cholerae NOT DETECTED NOT DETECTED Final   Enteroaggregative E coli (EAEC) NOT DETECTED NOT DETECTED Final   Enteropathogenic E coli (EPEC) NOT DETECTED NOT DETECTED Final   Enterotoxigenic E coli (ETEC) NOT DETECTED NOT DETECTED Final   Shiga like toxin producing E coli (STEC) NOT DETECTED NOT DETECTED Final   Shigella/Enteroinvasive E coli (EIEC) NOT DETECTED NOT DETECTED Final   Cryptosporidium NOT DETECTED NOT DETECTED Final   Cyclospora cayetanensis NOT DETECTED NOT DETECTED Final   Entamoeba histolytica NOT DETECTED NOT DETECTED Final   Giardia lamblia NOT DETECTED NOT DETECTED Final   Adenovirus F40/41 NOT DETECTED NOT DETECTED Final   Astrovirus NOT DETECTED NOT DETECTED Final   Norovirus GI/GII NOT DETECTED NOT DETECTED Final   Rotavirus A NOT DETECTED NOT DETECTED Final   Sapovirus (I, II, IV, and V) NOT DETECTED NOT DETECTED Final    Radiology Reports Ct Abdomen  Pelvis Wo Contrast  Result Date: 07/15/2016 CLINICAL DATA:  Abdominal pain and weakness.  Leukocytosis. EXAM: CT ABDOMEN AND PELVIS WITHOUT CONTRAST TECHNIQUE: Multidetector CT imaging of the abdomen and pelvis was performed following the standard protocol without IV contrast. COMPARISON:  None. 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 Hepatobiliary: No focal liver abnormality is seen. Status post cholecystectomy. No biliary dilatation. Pancreas: Unremarkable. No pancreatic ductal dilatation or surrounding inflammatory changes. Spleen: Normal in size without focal abnormality. Adrenals/Urinary Tract: Adrenal glands are unremarkable. Kidneys are normal, without renal calculi, focal lesion, or hydronephrosis. Bladder is unremarkable. Stomach/Bowel: Abnormal mural thickening and pericolonic edema involving the entire descending colon and proximal sigmoid. Distal transverse colon is involved to a much lesser degree. Right hemicolon is normal. Leading considerations are ischemia or colitis. No bowel obstruction. No perforation. Scant volume ascites. Vascular/Lymphatic: Aortic atherosclerosis. No enlarged abdominal or pelvic lymph nodes. Reproductive: Status post hysterectomy. No adnexal masses. Other: Small fat containing umbilical hernia. Musculoskeletal: No significant skeletal lesions. Chronic appearing mild compression deformity of L4. IMPRESSION: 1. Abnormal mural thickening and edema involving the left hemicolon. Ischemia or colitis are the leading considerations. No obstruction or perforation. 2. 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 3. Aortic atherosclerosis. 4. Small fat containing umbilical hernia. 5. Chronic appearing L4 compression deformity. Electronically Signed   By: Andreas Newport M.D.   On: 07/15/2016 01:54     CBC  Recent Labs Lab 07/14/16 2243 07/15/16 0605 07/15/16 1705 07/16/16 0511 07/16/16 1349 07/17/16  5003  07/17/16 1131  WBC 12.0* 12.8*  --  13.1*  --  8.9 8.1  HGB 10.6* 10.1* 10.0* 8.9* 8.6* 8.2* 9.1*  HCT 33.3* 31.1* 31.0* 27.3* 26.6* 25.9* 28.8*  PLT 200 199  --  173  --  154 165  MCV 94.1 93.4  --  91.0  --  93.2 93.8  MCH 29.9 30.3  --  29.7  --  29.5 29.6  MCHC 31.8 32.5  --  32.6  --  31.7 31.6  RDW 14.7 14.8  --  14.6  --  15.1 15.2  LYMPHSABS 0.9  --   --   --   --   --   --   MONOABS 1.0  --   --   --   --   --   --   EOSABS 0.1  --   --   --   --   --   --   BASOSABS 0.0  --   --   --   --   --   --     Chemistries   Recent Labs Lab 07/14/16 2243 07/15/16 0605 07/16/16 0511 07/17/16 0511  NA 143 141 138 138  K 3.6 4.0 3.3* 4.4  CL 113* 109 109 111  CO2 21* 23 23 21*  GLUCOSE 146* 143* 111* 105*  BUN 36* 33* 22* 22*  CREATININE 1.41* 1.15* 1.15* 1.48*  CALCIUM 8.8* 8.7* 7.9* 7.5*  AST 30  --   --   --   ALT 27  --   --   --   ALKPHOS 92  --   --   --   BILITOT 0.6  --   --   --    ------------------------------------------------------------------------------------------------------------------ No results for input(s): CHOL, HDL, LDLCALC, TRIG, CHOLHDL, LDLDIRECT in the last 72 hours.  Lab Results  Component Value Date   HGBA1C 5.2 05/09/2016   ------------------------------------------------------------------------------------------------------------------ No results for input(s): TSH, T4TOTAL, T3FREE, THYROIDAB in the last 72 hours.  Invalid input(s): FREET3 ------------------------------------------------------------------------------------------------------------------ No results for input(s): VITAMINB12, FOLATE, FERRITIN, TIBC, IRON, RETICCTPCT in the last 72 hours.  Coagulation profile No results for input(s): INR, PROTIME in the last 168 hours.  No results for input(s): DDIMER in the last 72 hours.  Cardiac Enzymes No results for input(s): CKMB, TROPONINI, MYOGLOBIN in the last 168 hours.  Invalid input(s):  CK ------------------------------------------------------------------------------------------------------------------    Component Value Date/Time   BNP 15.9 01/21/2015 1000     Adaora Mchaney E Terresa Marlett M.D on 07/17/2016 at 12:00 PM  Between 7am to 7pm - Pager - [phone]  After 7pm go to [email or web address] - password [facility]  [facility] -  Office  [phone]

## 2016-07-17 NOTE — Progress Notes (Signed)
Subjective: Some gas pain, resolved after flatus. No diarrhea. No further blood in stool.  Objective: Vital signs in last 24 hours: Temp:  [98 F (36.7 C)-98.8 F (37.1 C)] 98.2 F (36.8 C) (12/07 1109) Pulse Rate:  [84-94] 84 (12/07 1109) Resp:  [20-22] 20 (12/07 1109) BP: (114-159)/(42-66) 151/66 (12/07 1109) SpO2:  [98 %] 98 % (12/07 1109) Weight change:  Last BM Date: 07/15/16  PE: GEN:  Overweight, expressive aphasia ABD:  Soft, protuberant, non-tender  Lab Results: CBC    Component Value Date/Time   WBC 8.1 07/17/2016 1131   RBC 3.07 (L) 07/17/2016 1131   HGB 9.1 (L) 07/17/2016 1131   HCT 28.8 (L) 07/17/2016 1131   HCT 34.1 05/09/2016 1640   PLT 165 07/17/2016 1131   PLT 234 05/09/2016 1640   MCV 93.8 07/17/2016 1131   MCV 94 05/09/2016 1640   MCH 29.6 07/17/2016 1131   MCHC 31.6 07/17/2016 1131   RDW 15.2 07/17/2016 1131   RDW 15.8 (H) 05/09/2016 1640   LYMPHSABS 0.9 07/14/2016 2243   LYMPHSABS 1.7 05/09/2016 1640   MONOABS 1.0 07/14/2016 2243   EOSABS 0.1 07/14/2016 2243   EOSABS 0.4 05/09/2016 1640   BASOSABS 0.0 07/14/2016 2243   BASOSABS 0.1 05/09/2016 1640   CMP     Component Value Date/Time   NA 136 07/17/2016 1131   NA 147 (H) 05/09/2016 1640   K 4.5 07/17/2016 1131   CL 110 07/17/2016 1131   CO2 18 (L) 07/17/2016 1131   GLUCOSE 131 (H) 07/17/2016 1131   BUN 22 (H) 07/17/2016 1131   BUN 26 05/09/2016 1640   CREATININE 1.51 (H) 07/17/2016 1131   CALCIUM 7.8 (L) 07/17/2016 1131   PROT 5.9 (L) 07/17/2016 1131   PROT 6.3 05/09/2016 1640   ALBUMIN 3.3 (L) 07/17/2016 1131   ALBUMIN 3.9 05/09/2016 1640   AST 35 07/17/2016 1131   ALT 29 07/17/2016 1131   ALKPHOS 62 07/17/2016 1131   BILITOT 0.6 07/17/2016 1131   BILITOT 0.2 05/09/2016 1640   GFRNONAA 32 (L) 07/17/2016 1131   GFRAA 38 (L) 07/17/2016 1131   Assessment:  1. Abdominal pain, lower abdominal, improving. 2. Hematochezia. Suspect from colitis (on CT scan), which is in turn  most likely ischemic in etiology. Infectious and inflammatory less likely. Picture not reminiscent of upper GI tract bleeding.  No bleeding per patient over past 24 hours. 3. Anemia, acute blood loss, downtrend from yesterday. No rampant bleeding.  Most of decrease in Hgb is likely equilibrative changes. 4. Abnormal CT scan, bowel thickening left colon. Overall constellation of findings most suspicious for ischemic colitis.  Plan:  1.  10-day total course of antibiotics; ok from GI perspective to transition to oral formulation. 2.  Advance diet to soft. 3.  OOBTC as tolerated. 4.  Hopefully able to be discharged within next day or two; no further intervention or testing is anticipated from a GI perspective.   Landry Dyke 07/17/2016, 1:07 PM   Pager [phone] If no answer or after 5 PM call [phone]

## 2016-07-17 NOTE — Progress Notes (Signed)
PT would like to take ambulance home when discharged.

## 2016-07-17 NOTE — Progress Notes (Addendum)
Pt had episode of feeling "like passing out". Vitals complete and MD notified. At this time patient states she feels much better. Pt requests cooling gel for legs. Notified MD of this. Also notified that EKG is complete. Blood pressure was assessed however unable complete set of orthostatics because patient is unable to stand at baseline.

## 2016-07-18 LAB — BASIC METABOLIC PANEL
Anion gap: 7 (ref 5–15)
BUN: 19 mg/dL (ref 6–20)
CO2: 22 mmol/L (ref 22–32)
Calcium: 8 mg/dL — ABNORMAL LOW (ref 8.9–10.3)
Chloride: 112 mmol/L — ABNORMAL HIGH (ref 101–111)
Creatinine, Ser: 1.33 mg/dL — ABNORMAL HIGH (ref 0.44–1.00)
GFR calc Af Amer: 44 mL/min — ABNORMAL LOW (ref >60)
GFR, EST NON AFRICAN AMERICAN: 38 mL/min — AB (ref >60)
GLUCOSE: 112 mg/dL — AB (ref 65–99)
POTASSIUM: 3.9 mmol/L (ref 3.5–5.1)
Sodium: 141 mmol/L (ref 135–145)

## 2016-07-18 LAB — CBC
HCT: 26.1 % — ABNORMAL LOW (ref 36.0–46.0)
Hemoglobin: 8.5 g/dL — ABNORMAL LOW (ref 12.0–15.0)
MCH: 30.4 pg (ref 26.0–34.0)
MCHC: 32.6 g/dL (ref 30.0–36.0)
MCV: 93.2 fL (ref 78.0–100.0)
PLATELETS: 167 10*3/uL (ref 150–400)
RBC: 2.8 MIL/uL — AB (ref 3.87–5.11)
RDW: 15 % (ref 11.5–15.5)
WBC: 8 10*3/uL (ref 4.0–10.5)

## 2016-07-18 MED ORDER — CIPROFLOXACIN HCL 500 MG PO TABS: 500.0000 mg | ORAL_TABLET | Freq: Two times a day (BID) | ORAL | 0 refills | Status: AC

## 2016-07-18 MED ORDER — METRONIDAZOLE 500 MG PO TABS
500.0000 mg | ORAL_TABLET | Freq: Three times a day (TID) | ORAL | Status: DC
  Administered 2016-07-18 – 2016-07-21 (×10): 500 mg via ORAL
  Filled 2016-07-18 (×11): qty 1

## 2016-07-18 MED ORDER — AMLODIPINE BESYLATE 10 MG PO TABS
10.0000 mg | ORAL_TABLET | Freq: Every day | ORAL | Status: DC
  Administered 2016-07-18 – 2016-07-21 (×4): 10 mg via ORAL
  Filled 2016-07-18 (×4): qty 1

## 2016-07-18 MED ORDER — METRONIDAZOLE 500 MG PO TABS: 500.0000 mg | ORAL_TABLET | Freq: Three times a day (TID) | ORAL | 0 refills | Status: AC

## 2016-07-18 NOTE — Discharge Instructions (Addendum)
We have prescribed two antibiotics. Take ciprofloxacin daily and for 6 more days, 1 tablet 2 times a day. Take metronidazole/Flagyl to be on for 6 more days, 1 tablets 3 times a day. Please follow up with your primary care doctor, in 1-2 weeks, and gastroenterologist.  Also please do not take diclofenac or ibuprofen or advil, or naproxen, meloxicam, as this can increase the risk of bleeding.

## 2016-07-18 NOTE — Progress Notes (Signed)
Patient Demographics:    Tayden Duran, is a 75 y.o. female, DOB - 28-Nov-1940, RAQ:[id number]  Admit date - 07/14/2016   Admitting Physician Norval Morton, MD  Outpatient Primary MD for the patient is Claretta Fraise, MD  LOS - 3   Chief Complaint  Patient presents with  . Abdominal Pain  . Weakness        Subjective:   Talya Quain today has no Complaints, she is ready to go home. She has had at least 3 bowel movements without blood.    Patient has been seen and examined prior to discharge  Assessment  & Plan :    Principal Problem:   Colitis Active Problems:   GERD (gastroesophageal reflux disease)   Hypertension   Cerebellar degeneration   Hypothyroidism   UTI (urinary tract infection)   Abdominal pain  1) Lt Sided Colitis- As per GI consult, ischemic colitis is favored over infectious colitis, GI pathogen panel- negative. On Cipro and Flagyl empirically. Leukocytosis resolved, 8.9 from 13,000, no fevers or chills. Hematochezia has resolved, 3 subsequently non-bloody BM. GI (dr Paulita Fujita) deferred endoluminal evaluation at this time, recs for conservative management and Antibiotics, signed off.  - regular diet  - metronidazole and cipro continue as PO. - D/c IVF - Daily PPI - Was to go home today,pt has been adamant that she is not going to a nursing home, that she has more than enough support at home. Discharged orders placed, family says they do not want her to come home. Pt eval placed.   2)Acute on Chronicanemia-secondary to GI blood loss, hemoglobin is now stable at 8.5, from 10, giving ongoing bleeding, transfuse if hemoglobin below 8 or if patient becomes symptomatic. - Iron tabs on discharge - Avoid NSAIDs  3)HTN-stable, continue lisinopril 20 mg daily. - Resume Norvasc - may use IV Hydralazine 5 mg Every 4 hours Prn for systolic blood pressure over 160 mmhg  4)E  ColiUTI- continue Cipro. Urine culture sensitive to cipro.  5)Cerebellar Degeneration- patient is wheelchair and bedbound at baseline. Stable.  6)hypothyroidism- continue levothyroxine  7) Dizziness- chronic, intermittent, she is on meclizine at home. Episode here was related to abdominal pain, resolved the pain control. Vitals have been stable.  Disposition Plan  : Pending Pt eval, might still discharge today with home health if recommended, and family and patient are ok with plan.  Consults  :  GI   DVT Prophylaxis  :  SCds  Lab Results  Component Value Date   PLT 167 07/18/2016    Inpatient Medications  Scheduled Meds: . amLODipine  10 mg Oral Daily  . atorvastatin  40 mg Oral QHS  . ciprofloxacin  500 mg Oral BID  . diclofenac sodium  2 g Topical QID  . DULoxetine  60 mg Oral Daily  . ferrous sulfate  325 mg Oral BID WC  . fluticasone  2 spray Each Nare Daily  . levothyroxine  50 mcg Oral QAC breakfast  . lisinopril  20 mg Oral Daily  . metroNIDAZOLE  500 mg Oral Q8H  . pantoprazole  40 mg Oral Daily   Continuous Infusions: . sodium chloride 75 mL/hr at 07/17/16 0728   PRN Meds:.acetaminophen **OR** acetaminophen, albuterol, fentaNYL (SUBLIMAZE) injection, hydrALAZINE, iopamidol, lip  balm, meclizine, ondansetron **OR** ondansetron (ZOFRAN) IV, traMADol, zolpidem    Anti-infectives    Start     Dose/Rate Route Frequency Ordered Stop   07/18/16 0800  metroNIDAZOLE (FLAGYL) tablet 500 mg     500 mg Oral Every 8 hours 07/18/16 0744     07/18/16 0000  ciprofloxacin (CIPRO) 500 MG tablet     500 mg Oral 2 times daily 07/18/16 1248 07/25/16 2359   07/18/16 0000  metroNIDAZOLE (FLAGYL) 500 MG tablet     500 mg Oral Every 8 hours 07/18/16 1248 07/25/16 2359   07/17/16 2200  metroNIDAZOLE (FLAGYL) tablet 500 mg  Status:  Discontinued     500 mg Oral Every 12 hours 07/17/16 1518 07/18/16 0744   07/17/16 2000  ciprofloxacin (CIPRO) tablet 500 mg     500 mg Oral 2  times daily 07/17/16 1518     07/16/16 0000  cefTRIAXone (ROCEPHIN) 1 g in dextrose 5 % 50 mL IVPB  Status:  Discontinued     1 g 100 mL/hr over 30 Minutes Intravenous Every 24 hours 07/15/16 0332 07/15/16 0756   07/15/16 0900  ciprofloxacin (CIPRO) IVPB 400 mg  Status:  Discontinued     400 mg 200 mL/hr over 60 Minutes Intravenous Every 12 hours 07/15/16 0756 07/17/16 1518   07/15/16 0400  metroNIDAZOLE (FLAGYL) IVPB 500 mg  Status:  Discontinued     500 mg 100 mL/hr over 60 Minutes Intravenous Every 8 hours 07/15/16 0332 07/17/16 1518   07/15/16 0215  cefTRIAXone (ROCEPHIN) 1 g in dextrose 5 % 50 mL IVPB     1 g 100 mL/hr over 30 Minutes Intravenous  Once 07/15/16 0211 07/15/16 0255   07/15/16 0130  cefTRIAXone (ROCEPHIN) 1 g in dextrose 5 % 50 mL IVPB  Status:  Discontinued     1 g 100 mL/hr over 30 Minutes Intravenous  Once 07/15/16 0124 07/15/16 0125        Objective:   Vitals:   07/17/16 1013 07/17/16 1109 07/17/16 1521 07/18/16 0605  BP: 135/60 (!) 151/66 (!) 159/68 (!) 178/68  Pulse: 85 84 98 97  Resp:  20 (!) 21 20  Temp:  98.2 F (36.8 C) 97.6 F (36.4 C) 98.2 F (36.8 C)  TempSrc:  Oral Oral Oral  SpO2:  98% 98% 99%    Wt Readings from Last 3 Encounters:  07/01/16 91.6 kg (202 lb)  05/19/16 96.2 kg (212 lb)  04/28/16 94.9 kg (209 lb 4.8 oz)     Intake/Output Summary (Last 24 hours) at 07/18/16 1422 Last data filed at 07/18/16 0930  Gross per 24 hour  Intake              120 ml  Output                2 ml  Net              118 ml     Physical Exam Exam Gen:- Sleeping but arousable, oriented to time place and person HEENT:- [place].AT, moist oral mucosa Neck-Supple Neck,No JVD,  Lungs- mostly clear, equal air entry CV- S1, S2 normal, regular Abd-  +ve B.Sounds, Abd Soft, No tenderness,    Extremity/Skin:- Intact peripheral pulses, moving all extremities, strength 4+ over 5 globally   Data Review:   Micro Results Recent Results (from the past 240  hour(s))  C difficile quick scan w PCR reflex     Status: None   Collection Time: 07/14/16 10:00 PM  Result Value Ref Range Status   C Diff antigen NEGATIVE NEGATIVE Final   C Diff toxin NEGATIVE NEGATIVE Final   C Diff interpretation No C. difficile detected.  Final  Urine culture     Status: Abnormal   Collection Time: 07/14/16 11:30 PM  Result Value Ref Range Status   Specimen Description URINE, RANDOM  Final   Special Requests NONE  Final   Culture >=100,000 COLONIES/mL ESCHERICHIA COLI (A)  Final   Report Status 07/17/2016 FINAL  Final   Organism ID, Bacteria ESCHERICHIA COLI (A)  Final      Susceptibility   Escherichia coli - MIC*    AMPICILLIN 4 SENSITIVE Sensitive     CEFAZOLIN <=4 SENSITIVE Sensitive     CEFTRIAXONE <=1 SENSITIVE Sensitive     CIPROFLOXACIN <=0.25 SENSITIVE Sensitive     GENTAMICIN >=16 RESISTANT Resistant     IMIPENEM <=0.25 SENSITIVE Sensitive     NITROFURANTOIN <=16 SENSITIVE Sensitive     TRIMETH/SULFA >=320 RESISTANT Resistant     AMPICILLIN/SULBACTAM 4 SENSITIVE Sensitive     PIP/TAZO <=4 SENSITIVE Sensitive     Extended ESBL NEGATIVE Sensitive     * >=100,000 COLONIES/mL ESCHERICHIA COLI  Gastrointestinal Panel by PCR , Stool     Status: None   Collection Time: 07/15/16 10:20 AM  Result Value Ref Range Status   Campylobacter species NOT DETECTED NOT DETECTED Final   Plesimonas shigelloides NOT DETECTED NOT DETECTED Final   Salmonella species NOT DETECTED NOT DETECTED Final   Yersinia enterocolitica NOT DETECTED NOT DETECTED Final   Vibrio species NOT DETECTED NOT DETECTED Final   Vibrio cholerae NOT DETECTED NOT DETECTED Final   Enteroaggregative E coli (EAEC) NOT DETECTED NOT DETECTED Final   Enteropathogenic E coli (EPEC) NOT DETECTED NOT DETECTED Final   Enterotoxigenic E coli (ETEC) NOT DETECTED NOT DETECTED Final   Shiga like toxin producing E coli (STEC) NOT DETECTED NOT DETECTED Final   Shigella/Enteroinvasive E coli (EIEC) NOT  DETECTED NOT DETECTED Final   Cryptosporidium NOT DETECTED NOT DETECTED Final   Cyclospora cayetanensis NOT DETECTED NOT DETECTED Final   Entamoeba histolytica NOT DETECTED NOT DETECTED Final   Giardia lamblia NOT DETECTED NOT DETECTED Final   Adenovirus F40/41 NOT DETECTED NOT DETECTED Final   Astrovirus NOT DETECTED NOT DETECTED Final   Norovirus GI/GII NOT DETECTED NOT DETECTED Final   Rotavirus A NOT DETECTED NOT DETECTED Final   Sapovirus (I, II, IV, and V) NOT DETECTED NOT DETECTED Final    Radiology Reports Ct Abdomen Pelvis Wo Contrast  Result Date: 07/15/2016 CLINICAL DATA:  Abdominal pain and weakness.  Leukocytosis. EXAM: CT ABDOMEN AND PELVIS WITHOUT CONTRAST TECHNIQUE: Multidetector CT imaging of the abdomen and pelvis was performed following the standard protocol without IV contrast. COMPARISON:  None. 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 Hepatobiliary: No focal liver abnormality is seen. Status post cholecystectomy. No biliary dilatation. Pancreas: Unremarkable. No pancreatic ductal dilatation or surrounding inflammatory changes. Spleen: Normal in size without focal abnormality. Adrenals/Urinary Tract: Adrenal glands are unremarkable. Kidneys are normal, without renal calculi, focal lesion, or hydronephrosis. Bladder is unremarkable. Stomach/Bowel: Abnormal mural thickening and pericolonic edema involving the entire descending colon and proximal sigmoid. Distal transverse colon is involved to a much lesser degree. Right hemicolon is normal. Leading considerations are ischemia or colitis. No bowel obstruction. No perforation. Scant volume ascites. Vascular/Lymphatic: Aortic atherosclerosis. No enlarged abdominal or pelvic lymph nodes. Reproductive: Status post hysterectomy. No adnexal masses. Other: Small  fat containing umbilical hernia. Musculoskeletal: No significant skeletal lesions. Chronic appearing mild compression deformity of L4.  IMPRESSION: 1. Abnormal mural thickening and edema involving the left hemicolon. Ischemia or colitis are the leading considerations. No obstruction or perforation. 2. 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 3. Aortic atherosclerosis. 4. Small fat containing umbilical hernia. 5. Chronic appearing L4 compression deformity. Electronically Signed   By: Andreas Newport M.D.   On: 07/15/2016 01:54     CBC  Recent Labs Lab 07/14/16 2243 07/15/16 0605  07/16/16 0511 07/16/16 1349 07/17/16 0511 07/17/16 1131 07/18/16 0537  WBC 12.0* 12.8*  --  13.1*  --  8.9 8.1 8.0  HGB 10.6* 10.1*  < > 8.9* 8.6* 8.2* 9.1* 8.5*  HCT 33.3* 31.1*  < > 27.3* 26.6* 25.9* 28.8* 26.1*  PLT 200 199  --  173  --  154 165 167  MCV 94.1 93.4  --  91.0  --  93.2 93.8 93.2  MCH 29.9 30.3  --  29.7  --  29.5 29.6 30.4  MCHC 31.8 32.5  --  32.6  --  31.7 31.6 32.6  RDW 14.7 14.8  --  14.6  --  15.1 15.2 15.0  LYMPHSABS 0.9  --   --   --   --   --   --   --   MONOABS 1.0  --   --   --   --   --   --   --   EOSABS 0.1  --   --   --   --   --   --   --   BASOSABS 0.0  --   --   --   --   --   --   --   < > = values in this interval not displayed.  Chemistries   Recent Labs Lab 07/14/16 2243 07/15/16 0605 07/16/16 0511 07/17/16 0511 07/17/16 1131 07/18/16 0537  NA 143 141 138 138 136 141  K 3.6 4.0 3.3* 4.4 4.5 3.9  CL 113* 109 109 111 110 112*  CO2 21* 23 23 21* 18* 22  GLUCOSE 146* 143* 111* 105* 131* 112*  BUN 36* 33* 22* 22* 22* 19  CREATININE 1.41* 1.15* 1.15* 1.48* 1.51* 1.33*  CALCIUM 8.8* 8.7* 7.9* 7.5* 7.8* 8.0*  AST 30  --   --   --  35  --   ALT 27  --   --   --  29  --   ALKPHOS 92  --   --   --  62  --   BILITOT 0.6  --   --   --  0.6  --    ------------------------------------------------------------------------------------------------------------------ No results for input(s): CHOL, HDL, LDLCALC, TRIG, CHOLHDL, LDLDIRECT in the last 72 hours.  Lab Results  Component  Value Date   HGBA1C 5.2 05/09/2016   ------------------------------------------------------------------------------------------------------------------ No results for input(s): TSH, T4TOTAL, T3FREE, THYROIDAB in the last 72 hours.  Invalid input(s): FREET3 ------------------------------------------------------------------------------------------------------------------ No results for input(s): VITAMINB12, FOLATE, FERRITIN, TIBC, IRON, RETICCTPCT in the last 72 hours.  Coagulation profile No results for input(s): INR, PROTIME in the last 168 hours.  No results for input(s): DDIMER in the last 72 hours.  Cardiac Enzymes  Recent Labs Lab 07/17/16 1131  TROPONINI <0.03   ------------------------------------------------------------------------------------------------------------------    Component Value Date/Time   BNP 15.9 01/21/2015 1000     Bethena Roys M.D on 07/18/2016 at 2:22 PM  Between 7am to 7pm -  Pager - [phone]  After 7pm go to [email or web address] - password [email or web address]  [facility] -  Office  [phone]  Dragon dictation system was used to create this note, attempts have been made to correct errors, however presence of uncorrected errors is not a reflection quality of care provided

## 2016-07-18 NOTE — Progress Notes (Signed)
Subjective: Abdominal pain improving. No further blood in stool.  Objective: Vital signs in last 24 hours: Temp:  [97.6 F (36.4 C)-98.2 F (36.8 C)] 98.2 F (36.8 C) (12/08 0605) Pulse Rate:  [84-98] 97 (12/08 0605) Resp:  [20-21] 20 (12/08 0605) BP: (135-178)/(60-68) 178/68 (12/08 0605) SpO2:  [98 %-99 %] 99 % (12/08 0605) Weight change:  Last BM Date: 07/15/16  PE: GEN:  NAD, overweight, expressive aphasia. ABD:  Soft, protuberant, mild lower abdominal tenderness without peritonitis  Lab Results: CBC    Component Value Date/Time   WBC 8.0 07/18/2016 0537   RBC 2.80 (L) 07/18/2016 0537   HGB 8.5 (L) 07/18/2016 0537   HCT 26.1 (L) 07/18/2016 0537   HCT 34.1 05/09/2016 1640   PLT 167 07/18/2016 0537   PLT 234 05/09/2016 1640   MCV 93.2 07/18/2016 0537   MCV 94 05/09/2016 1640   MCH 30.4 07/18/2016 0537   MCHC 32.6 07/18/2016 0537   RDW 15.0 07/18/2016 0537   RDW 15.8 (H) 05/09/2016 1640   LYMPHSABS 0.9 07/14/2016 2243   LYMPHSABS 1.7 05/09/2016 1640   MONOABS 1.0 07/14/2016 2243   EOSABS 0.1 07/14/2016 2243   EOSABS 0.4 05/09/2016 1640   BASOSABS 0.0 07/14/2016 2243   BASOSABS 0.1 05/09/2016 1640   CMP     Component Value Date/Time   NA 141 07/18/2016 0537   NA 147 (H) 05/09/2016 1640   K 3.9 07/18/2016 0537   CL 112 (H) 07/18/2016 0537   CO2 22 07/18/2016 0537   GLUCOSE 112 (H) 07/18/2016 0537   BUN 19 07/18/2016 0537   BUN 26 05/09/2016 1640   CREATININE 1.33 (H) 07/18/2016 0537   CALCIUM 8.0 (L) 07/18/2016 0537   PROT 5.9 (L) 07/17/2016 1131   PROT 6.3 05/09/2016 1640   ALBUMIN 3.3 (L) 07/17/2016 1131   ALBUMIN 3.9 05/09/2016 1640   AST 35 07/17/2016 1131   ALT 29 07/17/2016 1131   ALKPHOS 62 07/17/2016 1131   BILITOT 0.6 07/17/2016 1131   BILITOT 0.2 05/09/2016 1640   GFRNONAA 38 (L) 07/18/2016 0537   GFRAA 44 (L) 07/18/2016 0537   Assessment:  1. Abdominal pain, lower abdominal, improving. 2. Hematochezia. Suspect from colitis (on CT  scan), which is in turn most likely ischemic in etiology. Infectious and inflammatory less likely. Picture not reminiscent of upper GI tract bleeding. No further bleeding. 3. Anemia, acute blood loss, stable trend.  No ongoing bleeding. Most of decrease in Hgb is likely equilibrative changes. 4. Abnormal CT scan, bowel thickening left colon. Overall constellation of findings most suspicious for ischemic colitis.  Plan:  1.  Soft diet. 2.  10-day course of antibiotics. 3.  Don't anticipate any further GI tract intervention. 4.  Patient can follow-up with her [facility] gastroenterologist upon hospital discharge. 5.  Will sign-off; please call with questions; thank you for the consultation.   Landry Dyke 07/18/2016, 10:09 AM   Pager [phone] If no answer or after 5 PM call [phone]

## 2016-07-18 NOTE — Discharge Summary (Deleted)
Tamara Stein, is a 75 y.o. female  DOB 05/07/1941  MRN [id number].  Admission date:  07/14/2016  Admitting Physician  Norval Morton, MD  Discharge Date:  07/18/2016   Primary MD  Claretta Fraise, MD  Recommendations for primary care physician for things to follow:  - Please follow up with PCP 1-2 weeks and gastroenterology - Any lower GI bleeding. - Hemoglobin check.  Admission Diagnosis  Colitis [K52.9] Acute UTI [N39.0]  Discharge Diagnosis  Colitis [K52.9] Acute UTI [N39.0]   Principal Problem:   Colitis Active Problems:   GERD (gastroesophageal reflux disease)   Hypertension   Cerebellar degeneration   Hypothyroidism   UTI (urinary tract infection)   Abdominal pain      Past Medical History:  Diagnosis Date  . Allergy   . Anxiety   . Cataract   . Cerebellar degeneration   . Chronic kidney disease   . Chronic knee pain   . Depression   . GERD (gastroesophageal reflux disease)   . Hyperlipidemia   . Hypertension   . Thyroid disease     Past Surgical History:  Procedure Laterality Date  . ABDOMINAL HYSTERECTOMY    . CHOLECYSTECTOMY    . RIGHT ELBOW         HPI  from the history and physical done on the day of admission:   H&P by Dr. Fuller Plan.   Chief Complaint: Abdominal pain  HPI: Tamara Stein is a 75 y.o. female with medical history significant of HTN, HLD, hypothyroidism, cerebellar degeneration, anxiety/depression, and GERD; who presents with complaints of crampy lower abdominal pain which started at 6 pm yesterday evening.  Reports that she had been having a bowel movement when symptoms initially as started in almost passed out while she was on the commode. Patient denies anything relieving or aggravating symptoms. Associated symptoms included nausea, vomiting, diaphoresis, and loose stools thereafter. Stools had been brown to red in color. Denies any  significant chest pain or shortness of breath. Patient notes that she had slurred speech and inability to walk after being diagnosed with cerebellar degeneration.   ED Course: Upon admission to emergency department patient was evaluated and seen to be afebrile, pulse 80-95, respirations 20-24, blood pressure as high as 177/106, and O2 saturation maintained. Laboratory revealed WBC 12, hemoglobin 10.6, sodium 143, potassium 3.6, chloride 113, CO2 21, BUN 36, creatinine 1.41, glucose 146. UA showed many bacteria, small bilirubin, small leukocytes, positive nitrites, no squamous epithelial cells, and 6-30 WBCs. Stool was checked and negative for C. difficile. CT imaging of the abdomen and pelvis showed some mild thickening of the left hemicolon suggestive of possible colitis. Patient was given 1 dose of ceftriaxone IV. Eyes: PERRL, lids and conjunctivae normal ENMT: Mucous membranes are moist. Posterior pharynx clear of any exudate or lesions.Normal dentition.  Neck: normal, supple, no masses, no thyromegaly Respiratory: clear to auscultation bilaterally, no wheezing, no crackles. Normal respiratory effort. No accessory muscle use.  Cardiovascular: Regular rate and rhythm, +SEM murmurs / rubs / gallops. No  extremity edema. 2+ pedal pulses. No carotid bruits.  Abdomen:  Significant tenderness to palpation of the lower abdomen, no masses palpated. No hepatosplenomegaly. Bowel sounds positive.  Rectal: Chaperoned by nursing staff. External hemorrhoids present. Musculoskeletal: no clubbing / cyanosis. No joint deformity upper and lower extremities. Good ROM, no contractures. Normal muscle tone.  Skin: no rashes, lesions, ulcers. No induration Neurologic: CN 2-12 grossly intact. Slurred speech Psychiatric: Normal judgment and insight. Alert and oriented x 3. Normal mood.     Hospital Course:   1) Lt Sided Colitis- Seen on CT . Patient was started on IV antibiotics - ceftriaxone and metronidazole -  10/5. GI consulted- ischemic colitis  favored over infectious colitis, endoluminal evaluation was deferred. GI pathogen panel- negative. Leukocytosis resolved-8 on discharge, with improvement in diarrhea and abdominal pain.  Antibiotics switched to ciprofloxacin and metronidazole- to complete 10 days- 07/24/16. On discharge patient is have no bloody stools, and was tolerating soft diet.   2)Acute on Chronicanemia-secondary to GI blood loss, hemoglobin is now stable at 8.5 on discharge, from 10 on admission,  Continue home iron tablets on discharge follow-up with PCP for hgb check.  3)HTN-stable, amlodipine initially held for soft Blood pressures and on ongoing GI bleed continue home Norvasc and lisinopril on discharge.  4)E ColiUTI- continue Cipro. Urine culture 12/4 sensitive to cipro.  5)Cerebellar Degeneration- patient is wheelchair and bedbound at baseline. Stable.  6)hypothyroidism- continue levothyroxine  7) Dizziness- chronic, stable, intermittent . Continue home meclizine.    Disposition Plan  :  discharged home today.  Discharge Condition: Fair  Follow UP- PCP, [place] gastroenterologist.  Consults obtained - GI  Diet and Activity recommendation:  As advised  Discharge Instructions   We have prescribed two antibiotics. Take ciprofloxacin daily and for 6 more days, 1 tablet 2 times a day. Take metronidazole/Flagyl to be on for 6 more days, 1 tablets 3 times a day. Please follow up with your primary care doctor, in 1-2 weeks, and gastroenterologist.  Also please do not take diclofenac or ibuprofen or advil, or naproxen, meloxicam, as this can increase the risk of bleeding.    Discharge Instructions    Call MD for:  persistant nausea and vomiting    Complete by:  As directed    Call MD for:  severe uncontrolled pain    Complete by:  As directed    Call MD for:  temperature >100.4    Complete by:  As directed    Diet - low sodium heart healthy    Complete by:   As directed    Discharge instructions    Complete by:  As directed    We have prescribed two antibiotics. Take ciprofloxacin daily and for 6 more days, 1 tablet 2 times a day. Take metronidazole/Flagyl to be on for 6 more days, 1 tablets 3 times a day. Please follow up with your primary care doctor, in 1-2 weeks.   Increase activity slowly    Complete by:  As directed         Discharge Medications       Medication List    STOP taking these medications   dexlansoprazole 60 MG capsule Commonly known as:  DEXILANT   diclofenac 50 MG EC tablet Commonly known as:  VOLTAREN     TAKE these medications   amLODipine 10 MG tablet Commonly known as:  NORVASC Take 1 tablet (10 mg total) by mouth daily. For blood pressure   aspirin EC 81 MG tablet  Take 81 mg by mouth every morning.   atorvastatin 40 MG tablet Commonly known as:  LIPITOR Take 1 tablet by mouth at  bedtime   CALTRATE 600+D PO Take 1 tablet by mouth daily.   ciprofloxacin 500 MG tablet Commonly known as:  CIPRO Take 1 tablet (500 mg total) by mouth 2 (two) times daily. What changed:  medication strength  how much to take  when to take this   DULoxetine 60 MG capsule Commonly known as:  CYMBALTA TAKE 1 CAPSULE BY MOUTH  DAILY What changed:  See the new instructions.   ferrous sulfate 325 (65 FE) MG tablet Take 325 mg by mouth daily with breakfast. Reported on 02/29/2016   fluticasone 50 MCG/ACT nasal spray Commonly known as:  FLONASE Place 2 sprays into both nostrils daily. What changed:  when to take this  reasons to take this   levothyroxine 50 MCG tablet Commonly known as:  SYNTHROID, LEVOTHROID Take 1 tablet by mouth  daily before breakfast   lisinopril 20 MG tablet Commonly known as:  PRINIVIL,ZESTRIL Take 1 tablet (20 mg total) by mouth daily.   meclizine 25 MG tablet Commonly known as:  ANTIVERT Take 1 tablet (25 mg total) by mouth 3 (three) times daily as needed for dizziness.     metroNIDAZOLE 500 MG tablet Commonly known as:  FLAGYL Take 1 tablet (500 mg total) by mouth every 8 (eight) hours.   multivitamin with minerals Tabs tablet Take 1 tablet by mouth daily.   omeprazole 20 MG capsule Commonly known as:  PRILOSEC Take 1 capsule (20 mg total) by mouth daily.   raloxifene 60 MG tablet Commonly known as:  EVISTA TAKE 1 TABLET BY MOUTH  DAILY What changed:  See the new instructions.   traMADol 50 MG tablet Commonly known as:  ULTRAM Take 1 tablet (50 mg total) by mouth 4 (four) times daily as needed for moderate pain.       Major procedures and Radiology Reports - PLEASE review detailed and final reports for all details, in brief -   CT abdomen and pelvis without contrast. Abnormal mural thickening and edema involving the left hemicolon. Ischemia or colitis are the leading considerations. No obstruction or perforation.   Ct Abdomen Pelvis Wo Contrast  Result Date: 07/15/2016 CLINICAL DATA:  Abdominal pain and weakness.  Leukocytosis. EXAM: CT ABDOMEN AND PELVIS WITHOUT CONTRAST TECHNIQUE: Multidetector CT imaging of the abdomen and pelvis was performed following the standard protocol without IV contrast. COMPARISON:  None. 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 Hepatobiliary: No focal liver abnormality is seen. Status post cholecystectomy. No biliary dilatation. Pancreas: Unremarkable. No pancreatic ductal dilatation or surrounding inflammatory changes. Spleen: Normal in size without focal abnormality. Adrenals/Urinary Tract: Adrenal glands are unremarkable. Kidneys are normal, without renal calculi, focal lesion, or hydronephrosis. Bladder is unremarkable. Stomach/Bowel: Abnormal mural thickening and pericolonic edema involving the entire descending colon and proximal sigmoid. Distal transverse colon is involved to a much lesser degree. Right hemicolon is normal. Leading considerations are ischemia or colitis. No  bowel obstruction. No perforation. Scant volume ascites. Vascular/Lymphatic: Aortic atherosclerosis. No enlarged abdominal or pelvic lymph nodes. Reproductive: Status post hysterectomy. No adnexal masses. Other: Small fat containing umbilical hernia. Musculoskeletal: No significant skeletal lesions. Chronic appearing mild compression deformity of L4. IMPRESSION: 1. Abnormal mural thickening and edema involving the left hemicolon. Ischemia or colitis are the leading considerations. No obstruction or perforation. 2. 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  3. Aortic atherosclerosis. 4. Small fat containing umbilical hernia. 5. Chronic appearing L4 compression deformity. Electronically Signed   By: Andreas Newport M.D.   On: 07/15/2016 01:54    Micro Results   Recent Results (from the past 240 hour(s))  C difficile quick scan w PCR reflex     Status: None   Collection Time: 07/14/16 10:00 PM  Result Value Ref Range Status   C Diff antigen NEGATIVE NEGATIVE Final   C Diff toxin NEGATIVE NEGATIVE Final   C Diff interpretation No C. difficile detected.  Final  Urine culture     Status: Abnormal   Collection Time: 07/14/16 11:30 PM  Result Value Ref Range Status   Specimen Description URINE, RANDOM  Final   Special Requests NONE  Final   Culture >=100,000 COLONIES/mL ESCHERICHIA COLI (A)  Final   Report Status 07/17/2016 FINAL  Final   Organism ID, Bacteria ESCHERICHIA COLI (A)  Final      Susceptibility   Escherichia coli - MIC*    AMPICILLIN 4 SENSITIVE Sensitive     CEFAZOLIN <=4 SENSITIVE Sensitive     CEFTRIAXONE <=1 SENSITIVE Sensitive     CIPROFLOXACIN <=0.25 SENSITIVE Sensitive     GENTAMICIN >=16 RESISTANT Resistant     IMIPENEM <=0.25 SENSITIVE Sensitive     NITROFURANTOIN <=16 SENSITIVE Sensitive     TRIMETH/SULFA >=320 RESISTANT Resistant     AMPICILLIN/SULBACTAM 4 SENSITIVE Sensitive     PIP/TAZO <=4 SENSITIVE Sensitive     Extended ESBL NEGATIVE Sensitive       * >=100,000 COLONIES/mL ESCHERICHIA COLI  Gastrointestinal Panel by PCR , Stool     Status: None   Collection Time: 07/15/16 10:20 AM  Result Value Ref Range Status   Campylobacter species NOT DETECTED NOT DETECTED Final   Plesimonas shigelloides NOT DETECTED NOT DETECTED Final   Salmonella species NOT DETECTED NOT DETECTED Final   Yersinia enterocolitica NOT DETECTED NOT DETECTED Final   Vibrio species NOT DETECTED NOT DETECTED Final   Vibrio cholerae NOT DETECTED NOT DETECTED Final   Enteroaggregative E coli (EAEC) NOT DETECTED NOT DETECTED Final   Enteropathogenic E coli (EPEC) NOT DETECTED NOT DETECTED Final   Enterotoxigenic E coli (ETEC) NOT DETECTED NOT DETECTED Final   Shiga like toxin producing E coli (STEC) NOT DETECTED NOT DETECTED Final   Shigella/Enteroinvasive E coli (EIEC) NOT DETECTED NOT DETECTED Final   Cryptosporidium NOT DETECTED NOT DETECTED Final   Cyclospora cayetanensis NOT DETECTED NOT DETECTED Final   Entamoeba histolytica NOT DETECTED NOT DETECTED Final   Giardia lamblia NOT DETECTED NOT DETECTED Final   Adenovirus F40/41 NOT DETECTED NOT DETECTED Final   Astrovirus NOT DETECTED NOT DETECTED Final   Norovirus GI/GII NOT DETECTED NOT DETECTED Final   Rotavirus A NOT DETECTED NOT DETECTED Final   Sapovirus (I, II, IV, and V) NOT DETECTED NOT DETECTED Final    Today   Subjective    Tamara Stein today has no Complaints, she is ready to go home. She has had at least 3 bowel movements without blood.    Patient has been seen and examined prior to discharge   Objective   Blood pressure (!) 178/68, pulse 97, temperature 98.2 F (36.8 C), temperature source Oral, resp. rate 20, SpO2 99 %.   Intake/Output Summary (Last 24 hours) at 07/18/16 1304 Last data filed at 07/18/16 0930  Gross per 24 hour  Intake              120 ml  Output                2 ml  Net              118 ml    Exam Gen:- Sleeping but arousable, oriented to time place and  person HEENT:- [place].AT, moist oral mucosa Neck-Supple Neck,No JVD,  Lungs- mostly clear, equal air entry CV- S1, S2 normal, regular Abd-  +ve B.Sounds, Abd Soft, No tenderness,    Extremity/Skin:- Intact peripheral pulses, moving all extremities, strength 4+ over 5 globally   Data Review   CBC w Diff:  Lab Results  Component Value Date   WBC 8.0 07/18/2016   HGB 8.5 (L) 07/18/2016   HCT 26.1 (L) 07/18/2016   HCT 34.1 05/09/2016   PLT 167 07/18/2016   PLT 234 05/09/2016   LYMPHOPCT 7 07/14/2016   MONOPCT 8 07/14/2016   EOSPCT 1 07/14/2016   BASOPCT 0 07/14/2016    CMP:  Lab Results  Component Value Date   NA 141 07/18/2016   NA 147 (H) 05/09/2016   K 3.9 07/18/2016   CL 112 (H) 07/18/2016   CO2 22 07/18/2016   BUN 19 07/18/2016   BUN 26 05/09/2016   CREATININE 1.33 (H) 07/18/2016   PROT 5.9 (L) 07/17/2016   PROT 6.3 05/09/2016   ALBUMIN 3.3 (L) 07/17/2016   ALBUMIN 3.9 05/09/2016   BILITOT 0.6 07/17/2016   BILITOT 0.2 05/09/2016   ALKPHOS 62 07/17/2016   AST 35 07/17/2016   ALT 29 07/17/2016  .   Total Discharge time is about 33 minutes  Bethena Roys M.D on 07/18/2016 at 1:04 PM  [facility]   Office  [phone]  Dragon dictation system was used to create this note, attempts have been made to correct errors, however presence of uncorrected errors is not a reflection quality of care provided

## 2016-07-19 DIAGNOSIS — G319 Degenerative disease of nervous system, unspecified: Secondary | ICD-10-CM

## 2016-07-19 DIAGNOSIS — I10 Essential (primary) hypertension: Secondary | ICD-10-CM

## 2016-07-19 DIAGNOSIS — K219 Gastro-esophageal reflux disease without esophagitis: Secondary | ICD-10-CM

## 2016-07-19 DIAGNOSIS — R1030 Lower abdominal pain, unspecified: Secondary | ICD-10-CM

## 2016-07-19 DIAGNOSIS — K529 Noninfective gastroenteritis and colitis, unspecified: Secondary | ICD-10-CM

## 2016-07-19 DIAGNOSIS — N3000 Acute cystitis without hematuria: Secondary | ICD-10-CM

## 2016-07-19 DIAGNOSIS — E039 Hypothyroidism, unspecified: Secondary | ICD-10-CM

## 2016-07-19 MED ORDER — TRAMADOL HCL 50 MG PO TABS: 50.0000 mg | ORAL_TABLET | Freq: Four times a day (QID) | ORAL | 0 refills | Status: DC | PRN

## 2016-07-19 NOTE — NC FL2 (Signed)
[place] LEVEL OF CARE SCREENING TOOL     IDENTIFICATION  Patient Name: Tamara Stein Birthdate: Apr 10, 1941 Sex: female Admission Date (Current Location): 07/14/2016  [facility] and [place] Number:  Herbalist and Address:  [facility],  [place] [address] [place]      Provider Number: [id number]  Attending Physician Name and Address:  Murlean Iba, MD  Relative Name and Phone Number:       Current Level of Care: Hospital Recommended Level of Care: [place] Prior Approval Number:    Date Approved/Denied:   PASRR Number:    Discharge Plan: SNF    Current Diagnoses: Patient Active Problem List   Diagnosis Date Noted  . UTI (urinary tract infection) 07/15/2016  . Abdominal pain 07/15/2016  . Colitis 07/15/2016  . Syncope 04/26/2016  . Lower urinary tract infectious disease 04/26/2016  . Fall   . Pain of both shoulder joints 02/29/2016  . Arthritis of knee 09/24/2015  . Constipation 06/05/2015  . Depression 06/05/2015  . Hypothyroidism 06/05/2015  . GERD (gastroesophageal reflux disease)   . Hypertension   . Hyperlipidemia   . Cerebellar degeneration     Orientation RESPIRATION BLADDER Height & Weight     Self, Time, Situation, Place  Normal Incontinent Weight:   Height:     BEHAVIORAL SYMPTOMS/MOOD NEUROLOGICAL BOWEL NUTRITION STATUS      Incontinent Diet (Soft)  AMBULATORY STATUS COMMUNICATION OF NEEDS Skin   Extensive Assist Verbally Normal                       Personal Care Assistance Level of Assistance  Bathing, Dressing Bathing Assistance: Limited assistance   Dressing Assistance: Limited assistance     Functional Limitations Info             SPECIAL CARE FACTORS FREQUENCY  PT (By licensed PT), OT (By licensed OT)     PT Frequency: 5 OT Frequency: 5            Contractures      Additional Factors Info  Code Status, Allergies Code Status Info:  Fullcode Allergies Info: Sulfa Antibiotics, Codeine, Erythromycin, Ibuprofen, Penicillins           Current Medications (07/19/2016):  This is the current hospital active medication list Current Facility-Administered Medications  Medication Dose Route Frequency Provider Last Rate Last Dose  . acetaminophen (TYLENOL) tablet 650 mg  650 mg Oral Q6H PRN Norval Morton, MD       Or  . acetaminophen (TYLENOL) suppository 650 mg  650 mg Rectal Q6H PRN Norval Morton, MD      . albuterol (PROVENTIL) (2.5 MG/3ML) 0.083% nebulizer solution 2.5 mg  2.5 mg Nebulization Q2H PRN Rondell A Tamala Julian, MD      . amLODipine (NORVASC) tablet 10 mg  10 mg Oral Daily Ejiroghene E Emokpae, MD   10 mg at 07/19/16 1131  . atorvastatin (LIPITOR) tablet 40 mg  40 mg Oral QHS Roxan Hockey, MD   40 mg at 07/18/16 2137  . ciprofloxacin (CIPRO) tablet 500 mg  500 mg Oral BID Ejiroghene Arlyce Dice, MD   500 mg at 07/19/16 1131  . diclofenac sodium (VOLTAREN) 1 % transdermal gel 2 g  2 g Topical QID Bethena Roys, MD   2 g at 07/19/16 1132  . DULoxetine (CYMBALTA) DR capsule 60 mg  60 mg Oral Daily Courage Emokpae, MD   60 mg at  07/19/16 1130  . fentaNYL (SUBLIMAZE) injection 25 mcg  25 mcg Intravenous Q2H PRN Norval Morton, MD      . ferrous sulfate tablet 325 mg  325 mg Oral BID WC Roxan Hockey, MD   325 mg at 07/19/16 1130  . fluticasone (FLONASE) 50 MCG/ACT nasal spray 2 spray  2 spray Each Nare Daily Norval Morton, MD   2 spray at 07/19/16 1131  . hydrALAZINE (APRESOLINE) injection 5 mg  5 mg Intravenous Q6H PRN Ejiroghene E Emokpae, MD      . iopamidol (ISOVUE-300) 61 % injection 30 mL  30 mL Oral Once PRN Quintella Reichert, MD      . levothyroxine (SYNTHROID, LEVOTHROID) tablet 50 mcg  50 mcg Oral QAC breakfast Norval Morton, MD   50 mcg at 07/19/16 1130  . lip balm (CARMEX) ointment   Topical PRN Roxan Hockey, MD      . lisinopril (PRINIVIL,ZESTRIL) tablet 20 mg  20 mg Oral Daily Roxan Hockey,  MD   20 mg at 07/19/16 1131  . meclizine (ANTIVERT) tablet 25 mg  25 mg Oral TID PRN Roxan Hockey, MD      . metroNIDAZOLE (FLAGYL) tablet 500 mg  500 mg Oral Q8H Ejiroghene E Emokpae, MD   500 mg at 07/19/16 0443  . ondansetron (ZOFRAN) tablet 4 mg  4 mg Oral Q6H PRN Norval Morton, MD       Or  . ondansetron (ZOFRAN) injection 4 mg  4 mg Intravenous Q6H PRN Rondell A Tamala Julian, MD      . pantoprazole (PROTONIX) EC tablet 40 mg  40 mg Oral Daily Roxan Hockey, MD   40 mg at 07/19/16 1132  . traMADol (ULTRAM) tablet 50 mg  50 mg Oral QID PRN Roxan Hockey, MD   50 mg at 07/17/16 1100  . zolpidem (AMBIEN) tablet 5 mg  5 mg Oral QHS PRN Gardiner Barefoot, NP   5 mg at 07/18/16 2137     Discharge Medications: Please see discharge summary for a list of discharge medications.  Relevant Imaging Results:  Relevant Lab Results:   Additional Information SSN: [id number]  Standley Brooking, LCSW

## 2016-07-19 NOTE — Discharge Summary (Addendum)
Physician Discharge Summary  Tamara Stein TMA:[id number] DOB: [birth year] DOA: 07/14/2016  PCP: Claretta Fraise, MD  Admit date: 07/14/2016 Discharge date: 07/21/2016  Admitted From: Home  Disposition:  SNF   Recommendations for Outpatient Follow-up:  1. Follow up with PCP in 2 weeks 2. Please obtain BMP/CBC in 1-2 week 3. Please take ciprofloxacin and flagy through 07/25/16 then STOP  Discharge Condition: STABLE CODE STATUS: FULL  Brief/Interim Summary: HPI: Tamara Stein is a 75 y.o. female with medical history significant of HTN, HLD, hypothyroidism, cerebellar degeneration, anxiety/depression, and GERD; who presents with complaints of crampy lower abdominal pain which started at 6 pm yesterday evening.  Reports that she had been having a bowel movement when symptoms initially as started in almost passed out while she was on the commode. Patient denies anything relieving or aggravating symptoms. Associated symptoms included nausea, vomiting, diaphoresis, and loose stools thereafter. Stools had been brown to red in color. Denies any significant chest pain or shortness of breath. Patient notes that she had slurred speech and inability to walk after being diagnosed with cerebellar degeneration.   ED Course: Upon admission to emergency department patient was evaluated and seen to be afebrile, pulse 80-95, respirations 20-24, blood pressure as high as 177/106, and O2 saturation maintained. Laboratory revealed WBC 12, hemoglobin 10.6, sodium 143, potassium 3.6, chloride 113, CO2 21, BUN 36, creatinine 1.41, glucose 146. UA showed many bacteria, small bilirubin, small leukocytes, positive nitrites, no squamous epithelial cells, and 6-30 WBCs. Stool was checked and negative for C. difficile. CT imaging of the abdomen and pelvis showed some mild thickening of the left hemicolon suggestive of possible colitis. Patient was given 1 dose of ceftriaxone IV.  [name] has  noComplaints, she is ready to go home. She has had at least 3 bowel movements without blood.   Patient has been seen and examined prior to discharge  Assessment  & Plan :    Principal Problem:   Colitis Active Problems:   GERD (gastroesophageal reflux disease)   Hypertension   Cerebellar degeneration   Hypothyroidism   UTI (urinary tract infection)   Abdominal pain  1) Lt Sided Colitis- As per GI consult, ischemic colitis is favored over infectious colitis, GI pathogen panel- negative. On Cipro and Flagyl empirically. Leukocytosis resolved, 8.9 from 13,000, no fevers or chills. Hematochezia has resolved, 3 subsequently non-bloody BM. GI (dr Paulita Fujita) deferred endoluminal evaluation at this time, recs for conservative management and Antibiotics, signed off.  - regular diet  - metronidazole and cipro continue as PO for full 10 days through 07/25/16 per GI consultant Dr. Paulita Fujita. - D/c IVF - Daily PPI - Pt changed her mind and decided to go to SNF on 07/19/16 and PT evaluated patient and fully agreed.   2)Acute on Chronicanemia-secondary to GI blood loss, hemoglobin is now stable at 8.5, from 10, giving ongoing bleeding, transfuse if hemoglobin below 8 or if patient becomes symptomatic. - Iron tabs on discharge - Avoid NSAIDs  3)HTN-stable, continue lisinopril 20 mg daily. - Resume Norvasc  4)E ColiUTI- continue Cipro. Urine culture sensitive to cipro.  5)Cerebellar Degeneration- patient is wheelchair and bedbound at baseline. Stable.  6)hypothyroidism- continue levothyroxine  7) Dizziness- chronic, intermittent, she is on meclizine at home. Episode here was related to abdominal pain, resolved the pain control. Vitals have been stable.  Disposition Plan  : SNF  Consults  :  GI  DVT Prophylaxis  :  SCds  Discharge Diagnoses:  Principal Problem:   Colitis Active Problems:  GERD (gastroesophageal reflux disease)   Hypertension   Cerebellar degeneration    Hypothyroidism   UTI (urinary tract infection)   Abdominal pain  Discharge Instructions  Discharge Instructions    Call MD for:  persistant nausea and vomiting    Complete by:  As directed    Call MD for:  severe uncontrolled pain    Complete by:  As directed    Call MD for:  temperature >100.4    Complete by:  As directed    Diet - low sodium heart healthy    Complete by:  As directed    Discharge instructions    Complete by:  As directed    We have prescribed two antibiotics. Take ciprofloxacin daily and for 6 more days, 1 tablet 2 times a day. Take metronidazole/Flagyl to be on for 6 more days, 1 tablets 3 times a day. Please follow up with your primary care doctor, in 1-2 weeks.   Increase activity slowly    Complete by:  As directed        Medication List    STOP taking these medications   dexlansoprazole 60 MG capsule Commonly known as:  DEXILANT   diclofenac 50 MG EC tablet Commonly known as:  VOLTAREN     TAKE these medications   amLODipine 10 MG tablet Commonly known as:  NORVASC Take 1 tablet (10 mg total) by mouth daily. For blood pressure   aspirin EC 81 MG tablet Take 81 mg by mouth every morning.   atorvastatin 40 MG tablet Commonly known as:  LIPITOR Take 1 tablet by mouth at  bedtime   CALTRATE 600+D PO Take 1 tablet by mouth daily.   ciprofloxacin 500 MG tablet Commonly known as:  CIPRO Take 1 tablet (500 mg total) by mouth 2 (two) times daily. What changed:  medication strength  how much to take  when to take this   diphenoxylate-atropine 2.5-0.025 MG tablet Commonly known as:  LOMOTIL Take 1 tablet by mouth 4 (four) times daily as needed for diarrhea or loose stools.   DULoxetine 60 MG capsule Commonly known as:  CYMBALTA TAKE 1 CAPSULE BY MOUTH  DAILY What changed:  See the new instructions.   ferrous sulfate 325 (65 FE) MG tablet Take 325 mg by mouth daily with breakfast. Reported on 02/29/2016   fluticasone 50 MCG/ACT nasal  spray Commonly known as:  FLONASE Place 2 sprays into both nostrils daily. What changed:  when to take this  reasons to take this   lactobacillus acidophilus & bulgar chewable tablet Chew 1 tablet by mouth 3 (three) times daily with meals.   levothyroxine 50 MCG tablet Commonly known as:  SYNTHROID, LEVOTHROID Take 1 tablet by mouth  daily before breakfast   lisinopril 20 MG tablet Commonly known as:  PRINIVIL,ZESTRIL Take 1 tablet (20 mg total) by mouth daily.   meclizine 25 MG tablet Commonly known as:  ANTIVERT Take 1 tablet (25 mg total) by mouth 3 (three) times daily as needed for dizziness.   metroNIDAZOLE 500 MG tablet Commonly known as:  FLAGYL Take 1 tablet (500 mg total) by mouth every 8 (eight) hours.   multivitamin with minerals Tabs tablet Take 1 tablet by mouth daily.   omeprazole 20 MG capsule Commonly known as:  PRILOSEC Take 1 capsule (20 mg total) by mouth daily.   raloxifene 60 MG tablet Commonly known as:  EVISTA TAKE 1 TABLET BY MOUTH  DAILY What changed:  See the new instructions.  traMADol 50 MG tablet Commonly known as:  ULTRAM Take 1 tablet (50 mg total) by mouth 4 (four) times daily as needed for moderate pain.   zolpidem 5 MG tablet Commonly known as:  AMBIEN Take 1 tablet (5 mg total) by mouth at bedtime as needed for sleep.      Follow-up Information    [name],[name], MD. Schedule an appointment as soon as possible for a visit in 2 week(s).   Specialty:  Family Medicine Contact information: [address] [place] [address] [phone]          Allergies  Allergen Reactions  . Sulfa Antibiotics Anaphylaxis    Facial swelling   . Codeine Other (See Comments)    BLACK OUTS  . Erythromycin Swelling  . Ibuprofen Other (See Comments)    Stomach pain, muscle spasm  . Penicillins Diarrhea    Has patient had a PCN reaction causing immediate rash, facial/tongue/throat swelling, SOB or lightheadedness with hypotension:  No Has patient had a PCN reaction causing severe rash involving mucus membranes or skin necrosis: No Has patient had a PCN reaction that required hospitalization Unknown Has patient had a PCN reaction occurring within the last 10 years: No If all of the above answers are "NO", then may proceed with Cephalosporin use.    Procedures/Studies: Ct Abdomen Pelvis Wo Contrast  Result Date: 07/15/2016 CLINICAL DATA:  Abdominal pain and weakness.  Leukocytosis. EXAM: CT ABDOMEN AND PELVIS WITHOUT CONTRAST TECHNIQUE: Multidetector CT imaging of the abdomen and pelvis was performed following the standard protocol without IV contrast. COMPARISON:  None. 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 Hepatobiliary: No focal liver abnormality is seen. Status post cholecystectomy. No biliary dilatation. Pancreas: Unremarkable. No pancreatic ductal dilatation or surrounding inflammatory changes. Spleen: Normal in size without focal abnormality. Adrenals/Urinary Tract: Adrenal glands are unremarkable. Kidneys are normal, without renal calculi, focal lesion, or hydronephrosis. Bladder is unremarkable. Stomach/Bowel: Abnormal mural thickening and pericolonic edema involving the entire descending colon and proximal sigmoid. Distal transverse colon is involved to a much lesser degree. Right hemicolon is normal. Leading considerations are ischemia or colitis. No bowel obstruction. No perforation. Scant volume ascites. Vascular/Lymphatic: Aortic atherosclerosis. No enlarged abdominal or pelvic lymph nodes. Reproductive: Status post hysterectomy. No adnexal masses. Other: Small fat containing umbilical hernia. Musculoskeletal: No significant skeletal lesions. Chronic appearing mild compression deformity of L4. IMPRESSION: 1. Abnormal mural thickening and edema involving the left hemicolon. Ischemia or colitis are the leading considerations. No obstruction or perforation. 2. 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 3. Aortic atherosclerosis. 4. Small fat containing umbilical hernia. 5. Chronic appearing L4 compression deformity. Electronically Signed   By: Andreas Newport M.D.   On: 07/15/2016 01:54    Subjective: Pt has been stable, sitting up in chair watching the game, agreed to go to SNF  Discharge Exam: Vitals:   07/19/16 2055 07/20/16 0538  BP: (!) 159/107 (!) 123/58  Pulse: (!) 102 95  Resp: 18 18  Temp: 98 F (36.7 C) 98.1 F (36.7 C)   Vitals:   07/19/16 0452 07/19/16 1500 07/19/16 2055 07/20/16 0538  BP: (!) 155/75 (!) 153/63 (!) 159/107 (!) 123/58  Pulse: 82 86 (!) 102 95  Resp: 18 18 18 18   Temp: 97.8 F (36.6 C) 97.7 F (36.5 C) 98 F (36.7 C) 98.1 F (36.7 C)  TempSrc: Oral Oral Oral Oral  SpO2: 100% 100% 98% 98%   Gen:- Sleeping but arousable, oriented to  time place and person HEENT:- Swift.AT,moist oral mucosa Neck-Supple Neck,No JVD, Lungs- mostly clear, equal air entry CV- S1, S2 normal, regular Abd- +ve B.Sounds, Abd Soft, Notenderness,  Extremity/Skin:- Intact peripheral pulses, moving all extremities, strength 4+over 5 globally  The results of significant diagnostics from this hospitalization (including imaging, microbiology, ancillary and laboratory) are listed below for reference.     Microbiology: Recent Results (from the past 240 hour(s))  C difficile quick scan w PCR reflex     Status: None   Collection Time: 07/14/16 10:00 PM  Result Value Ref Range Status   C Diff antigen NEGATIVE NEGATIVE Final   C Diff toxin NEGATIVE NEGATIVE Final   C Diff interpretation No C. difficile detected.  Final  Urine culture     Status: Abnormal   Collection Time: 07/14/16 11:30 PM  Result Value Ref Range Status   Specimen Description URINE, RANDOM  Final   Special Requests NONE  Final   Culture >=100,000 COLONIES/mL ESCHERICHIA COLI (A)  Final   Report Status 07/17/2016 FINAL  Final   Organism ID, Bacteria ESCHERICHIA  COLI (A)  Final      Susceptibility   Escherichia coli - MIC*    AMPICILLIN 4 SENSITIVE Sensitive     CEFAZOLIN <=4 SENSITIVE Sensitive     CEFTRIAXONE <=1 SENSITIVE Sensitive     CIPROFLOXACIN <=0.25 SENSITIVE Sensitive     GENTAMICIN >=16 RESISTANT Resistant     IMIPENEM <=0.25 SENSITIVE Sensitive     NITROFURANTOIN <=16 SENSITIVE Sensitive     TRIMETH/SULFA >=320 RESISTANT Resistant     AMPICILLIN/SULBACTAM 4 SENSITIVE Sensitive     PIP/TAZO <=4 SENSITIVE Sensitive     Extended ESBL NEGATIVE Sensitive     * >=100,000 COLONIES/mL ESCHERICHIA COLI  Gastrointestinal Panel by PCR , Stool     Status: None   Collection Time: 07/15/16 10:20 AM  Result Value Ref Range Status   Campylobacter species NOT DETECTED NOT DETECTED Final   Plesimonas shigelloides NOT DETECTED NOT DETECTED Final   Salmonella species NOT DETECTED NOT DETECTED Final   Yersinia enterocolitica NOT DETECTED NOT DETECTED Final   Vibrio species NOT DETECTED NOT DETECTED Final   Vibrio cholerae NOT DETECTED NOT DETECTED Final   Enteroaggregative E coli (EAEC) NOT DETECTED NOT DETECTED Final   Enteropathogenic E coli (EPEC) NOT DETECTED NOT DETECTED Final   Enterotoxigenic E coli (ETEC) NOT DETECTED NOT DETECTED Final   Shiga like toxin producing E coli (STEC) NOT DETECTED NOT DETECTED Final   Shigella/Enteroinvasive E coli (EIEC) NOT DETECTED NOT DETECTED Final   Cryptosporidium NOT DETECTED NOT DETECTED Final   Cyclospora cayetanensis NOT DETECTED NOT DETECTED Final   Entamoeba histolytica NOT DETECTED NOT DETECTED Final   Giardia lamblia NOT DETECTED NOT DETECTED Final   Adenovirus F40/41 NOT DETECTED NOT DETECTED Final   Astrovirus NOT DETECTED NOT DETECTED Final   Norovirus GI/GII NOT DETECTED NOT DETECTED Final   Rotavirus A NOT DETECTED NOT DETECTED Final   Sapovirus (I, II, IV, and V) NOT DETECTED NOT DETECTED Final     Labs: BNP (last 3 results) No results for input(s): BNP in the last 8760 hours. Basic  Metabolic Panel:  Recent Labs Lab 07/15/16 0605 07/16/16 0511 07/17/16 0511 07/17/16 1131 07/18/16 0537  NA 141 138 138 136 141  K 4.0 3.3* 4.4 4.5 3.9  CL 109 109 111 110 112*  CO2 23 23 21* 18* 22  GLUCOSE 143* 111* 105* 131* 112*  BUN 33* 22* 22* 22* 19  CREATININE 1.15* 1.15* 1.48* 1.51* 1.33*  CALCIUM 8.7* 7.9* 7.5* 7.8* 8.0*   Liver Function Tests:  Recent Labs Lab 07/14/16 2243 07/17/16 1131  AST 30 35  ALT 27 29  ALKPHOS 92 62  BILITOT 0.6 0.6  PROT 6.6 5.9*  ALBUMIN 3.9 3.3*   No results for input(s): LIPASE, AMYLASE in the last 168 hours. No results for input(s): AMMONIA in the last 168 hours. CBC:  Recent Labs Lab 07/14/16 2243 07/15/16 0605  07/16/16 0511 07/16/16 1349 07/17/16 0511 07/17/16 1131 07/18/16 0537  WBC 12.0* 12.8*  --  13.1*  --  8.9 8.1 8.0  NEUTROABS 10.0*  --   --   --   --   --   --   --   HGB 10.6* 10.1*  < > 8.9* 8.6* 8.2* 9.1* 8.5*  HCT 33.3* 31.1*  < > 27.3* 26.6* 25.9* 28.8* 26.1*  MCV 94.1 93.4  --  91.0  --  93.2 93.8 93.2  PLT 200 199  --  173  --  154 165 167  < > = values in this interval not displayed. Cardiac Enzymes:  Recent Labs Lab 07/17/16 1131  TROPONINI <0.03   BNP: Invalid input(s): POCBNP CBG:  Recent Labs Lab 07/17/16 1127  GLUCAP 128*   D-Dimer No results for input(s): DDIMER in the last 72 hours. Hgb A1c No results for input(s): HGBA1C in the last 72 hours. Lipid Profile No results for input(s): CHOL, HDL, LDLCALC, TRIG, CHOLHDL, LDLDIRECT in the last 72 hours. Thyroid function studies No results for input(s): TSH, T4TOTAL, T3FREE, THYROIDAB in the last 72 hours.  Invalid input(s): FREET3 Anemia work up No results for input(s): VITAMINB12, FOLATE, FERRITIN, TIBC, IRON, RETICCTPCT in the last 72 hours. Urinalysis    Component Value Date/Time   COLORURINE AMBER (A) 07/14/2016 2330   APPEARANCEUR CLOUDY (A) 07/14/2016 2330   APPEARANCEUR Cloudy (A) 07/01/2016 1104   LABSPEC 1.023  07/14/2016 2330   PHURINE 5.0 07/14/2016 2330   GLUCOSEU NEGATIVE 07/14/2016 2330   HGBUR NEGATIVE 07/14/2016 2330   BILIRUBINUR SMALL (A) 07/14/2016 2330   BILIRUBINUR Negative 07/01/2016 1104   KETONESUR NEGATIVE 07/14/2016 2330   PROTEINUR 100 (A) 07/14/2016 2330   UROBILINOGEN negative 09/06/2015 1155   UROBILINOGEN 0.2 03/25/2014 1359   NITRITE POSITIVE (A) 07/14/2016 2330   LEUKOCYTESUR SMALL (A) 07/14/2016 2330   LEUKOCYTESUR 1+ (A) 07/01/2016 1104   Sepsis Labs Invalid input(s): PROCALCITONIN,  WBC,  LACTICIDVEN Microbiology Recent Results (from the past 240 hour(s))  C difficile quick scan w PCR reflex     Status: None   Collection Time: 07/14/16 10:00 PM  Result Value Ref Range Status   C Diff antigen NEGATIVE NEGATIVE Final   C Diff toxin NEGATIVE NEGATIVE Final   C Diff interpretation No C. difficile detected.  Final  Urine culture     Status: Abnormal   Collection Time: 07/14/16 11:30 PM  Result Value Ref Range Status   Specimen Description URINE, RANDOM  Final   Special Requests NONE  Final   Culture >=100,000 COLONIES/mL ESCHERICHIA COLI (A)  Final   Report Status 07/17/2016 FINAL  Final   Organism ID, Bacteria ESCHERICHIA COLI (A)  Final      Susceptibility   Escherichia coli - MIC*    AMPICILLIN 4 SENSITIVE Sensitive     CEFAZOLIN <=4 SENSITIVE Sensitive     CEFTRIAXONE <=1 SENSITIVE Sensitive     CIPROFLOXACIN <=0.25 SENSITIVE Sensitive     GENTAMICIN >=16 RESISTANT  Resistant     IMIPENEM <=0.25 SENSITIVE Sensitive     NITROFURANTOIN <=16 SENSITIVE Sensitive     TRIMETH/SULFA >=320 RESISTANT Resistant     AMPICILLIN/SULBACTAM 4 SENSITIVE Sensitive     PIP/TAZO <=4 SENSITIVE Sensitive     Extended ESBL NEGATIVE Sensitive     * >=100,000 COLONIES/mL ESCHERICHIA COLI  Gastrointestinal Panel by PCR , Stool     Status: None   Collection Time: 07/15/16 10:20 AM  Result Value Ref Range Status   Campylobacter species NOT DETECTED NOT DETECTED Final    Plesimonas shigelloides NOT DETECTED NOT DETECTED Final   Salmonella species NOT DETECTED NOT DETECTED Final   Yersinia enterocolitica NOT DETECTED NOT DETECTED Final   Vibrio species NOT DETECTED NOT DETECTED Final   Vibrio cholerae NOT DETECTED NOT DETECTED Final   Enteroaggregative E coli (EAEC) NOT DETECTED NOT DETECTED Final   Enteropathogenic E coli (EPEC) NOT DETECTED NOT DETECTED Final   Enterotoxigenic E coli (ETEC) NOT DETECTED NOT DETECTED Final   Shiga like toxin producing E coli (STEC) NOT DETECTED NOT DETECTED Final   Shigella/Enteroinvasive E coli (EIEC) NOT DETECTED NOT DETECTED Final   Cryptosporidium NOT DETECTED NOT DETECTED Final   Cyclospora cayetanensis NOT DETECTED NOT DETECTED Final   Entamoeba histolytica NOT DETECTED NOT DETECTED Final   Giardia lamblia NOT DETECTED NOT DETECTED Final   Adenovirus F40/41 NOT DETECTED NOT DETECTED Final   Astrovirus NOT DETECTED NOT DETECTED Final   Norovirus GI/GII NOT DETECTED NOT DETECTED Final   Rotavirus A NOT DETECTED NOT DETECTED Final   Sapovirus (I, II, IV, and V) NOT DETECTED NOT DETECTED Final   Time coordinating discharge: 32 minutes  SIGNED:  Irwin Brakeman, MD  [facility] 07/20/2016, 11:03 AM Pager   If 7PM-7AM, please contact night-coverage [email or web address] Password [email or web address]

## 2016-07-19 NOTE — Clinical Social Work Placement (Addendum)
   CLINICAL SOCIAL WORK PLACEMENT  NOTE  Date:  07/19/2016  Patient Details  Name: Tamara Stein MRN: [id number] Date of Birth: [birth year]  Clinical Social Work is seeking post-discharge placement for this patient at the [place] level of care (*CSW will initial, date and re-position this form in  chart as items are completed):  Yes   Patient/family provided with [place] Work Department's list of facilities offering this level of care within the geographic area requested by the patient (or if unable, by the patient's family).  Yes   Patient/family informed of their freedom to choose among providers that offer the needed level of care, that participate in Medicare, Medicaid or managed care program needed by the patient, have an available bed and are willing to accept the patient.  Yes   Patient/family informed of [facility]'s ownership interest in [facility] and [facility], as well as of the fact that they are under no obligation to receive care at these facilities.  PASRR submitted to EDS on 07/19/16     PASRR number received on       Existing PASRR number confirmed on  07/21/2016     FL2 transmitted to all facilities in geographic area requested by pt/family on 07/19/16     FL2 transmitted to all facilities within larger geographic area on       Patient informed that his/her managed care company has contracts with or will negotiate with certain facilities, including the following:            Patient/family informed of bed offers received.  Patient chooses bed at  [facility]     Physician recommends and patient chooses bed at   [facility]   Patient to be transferred to   on  .07/21/2016  Patient to be transferred to facility by   [place]    Patient family notified on   of transfer.  Name of family member notified:        PHYSICIAN       Additional Comment:     _______________________________________________ Standley Brooking, LCSW 07/19/2016, 12:40 PM

## 2016-07-19 NOTE — Clinical Social Work Note (Signed)
Clinical Social Work Assessment  Patient Details  Name: Tamara Stein MRN: [id number] Date of Birth: 1941-06-23  Date of referral:  07/19/16               Reason for consult:  Facility Placement                Permission sought to share information with:  Chartered certified accountant granted to share information::  Yes, Verbal Permission Granted  Name::        Agency::     Relationship::     Contact Information:     Housing/Transportation Living arrangements for the past 2 months:  Single Family Home Source of Information:  Patient Patient Interpreter Needed:  None Criminal Activity/Legal Involvement Pertinent to Current Situation/Hospitalization:  No - Comment as needed Significant Relationships:  Siblings Lives with:  Self Do you feel safe going back to the place where you live?  No Need for family participation in patient care:  No (Coment)  Care giving concerns:  CSW received call from Dr. Wynetta Emery that patient is now agreeable with plan for SNF.    Social Worker assessment / plan:  CSW spoke with patient who states that she would prefer to go somewhere close to her home - patient lives in [place]. CSW informed patient that the only SNF in [place] is [facility]. CSW sent information out to [facility] SNFs - awaiting bed offers. CSW tried to leave message for [facility] to see if they are able to offer a bed, but no answer.   CSW also submitted for PASRR, kicked back to Manual Review - will have MD sign 30-day note to fax in.   Employment status:  Retired Forensic scientist:  Medicare PT Recommendations:    Information / Referral to community resources:     Patient/Family's Response to care:  Patient is now agreeable with plan for SNF, awaiting bed offers & PASRR.   Patient/Family's Understanding of and Emotional Response to Diagnosis, Current Treatment, and Prognosis:    Emotional Assessment Appearance:    Attitude/Demeanor/Rapport:     Affect (typically observed):    Orientation:  Oriented to Self, Oriented to Place, Oriented to  Time, Oriented to Situation Alcohol / Substance use:    Psych involvement (Current and /or in the community):     Discharge Needs  Concerns to be addressed:    Readmission within the last 30 days:    Current discharge risk:    Barriers to Discharge:      Standley Brooking, LCSW 07/19/2016, 12:37 PM

## 2016-07-19 NOTE — Evaluation (Signed)
Physical Therapy Evaluation Patient Details Name: Tamara Stein MRN: [id number] DOB: [birth year] Today's Date: 07/19/2016   History of Present Illness  HPI: Tamara Stein is a 75 y.o. female with medical history significant of HTN, HLD, hypothyroidism, cerebellar degeneration, anxiety/depression, and GERD; who presents with complaints of crampy lower abdominal pain which started at 6 pm yesterday evening.  Reports that she had been having a bowel movement when symptoms initially as started in almost passed out while she was on the commode. Patient denies anything relieving or aggravating symptoms. Associated symptoms included nausea, vomiting, diaphoresis, and loose stools thereafter. Stools had been brown to red in color  Clinical Impression  The patient is well known to this writer from previous admission. She was able to mobilize with 2 persons to recliner with lateral scoot. Discussed SNF benefits. Pt admitted with above diagnosis. Pt currently with functional limitations due to the deficits listed below (see PT Problem List).  Pt will benefit from skilled PT to increase their independence and safety with mobility to allow discharge to the venue listed below.        Follow Up Recommendations SNF;Supervision/Assistance - 24 hour    Equipment Recommendations  None recommended by PT    Recommendations for Other Services       Precautions / Restrictions Precautions Precautions: Fall Precaution Comments: ataxia      Mobility  Bed Mobility Overal bed mobility: Needs Assistance Bed Mobility: Rolling;Supine to Sit Rolling: Min assist   Supine to sit: Mod assist     General bed mobility comments: assist with trunk  Transfers Overall transfer level: Needs assistance   Transfers: Lateral/Scoot Transfers          Lateral/Scoot Transfers: Mod assist;+2 physical assistance;+2 safety/equipment General transfer comment: to recliner with drop arm, and use of bed pad, scooted  over to recliner .  Ambulation/Gait                Stairs            Wheelchair Mobility    Modified Rankin (Stroke Patients Only)       Balance Overall balance assessment: Needs assistance;History of Falls Sitting-balance support: Feet unsupported;Bilateral upper extremity supported Sitting balance-Leahy Scale: Fair                                       Pertinent Vitals/Pain Pain Assessment: No/denies pain    Home Living Family/patient expects to be discharged to:: Private residence Living Arrangements: Non-relatives/Friends Available Help at Discharge: Available PRN/intermittently;Personal care attendant;Family;Friend(s) Type of Home: House Home Access: Ramped entrance     Home Layout: One level Home Equipment: Tub bench;Grab bars - toilet;Grab bars - tub/shower;Wheelchair - power      Prior Function           Comments: patient is independent with scoot transfers from [facility] to toilet and bed. has  rails at toilet     Hand Dominance        Extremity/Trunk Assessment   Upper Extremity Assessment: RUE deficits/detail;LUE deficits/detail RUE Deficits / Details: ataxic,moves through rom     LUE Deficits / Details: ataxic   Lower Extremity Assessment: RLE deficits/detail;LLE deficits/detail RLE Deficits / Details: movements are atxic LLE Deficits / Details: same     Communication   Communication:  (ataxic voice)  Cognition Arousal/Alertness: Awake/alert Behavior During Therapy: WFL for tasks assessed/performed Overall Cognitive Status: Within Functional Limits for tasks  assessed                      General Comments      Exercises     Assessment/Plan    PT Assessment Patient needs continued PT services  PT Problem List Decreased activity tolerance;Decreased balance;Decreased mobility;Decreased coordination;Decreased knowledge of precautions;Decreased safety awareness;Decreased knowledge of use of DME           PT Treatment Interventions Functional mobility training;Therapeutic activities;Therapeutic exercise;Patient/family education    PT Goals (Current goals can be found in the Care Plan section)  Acute Rehab PT Goals Patient Stated Goal: to go home PT Goal Formulation: With patient Time For Goal Achievement: 08/02/16 Potential to Achieve Goals: Good    Frequency Min 3X/week   Barriers to discharge Decreased caregiver support      Co-evaluation               End of Session   Activity Tolerance: Patient tolerated treatment well Patient left: in chair;with call bell/phone within reach;with chair alarm set Nurse Communication: Mobility status         Time: 9485-4627 PT Time Calculation (min) (ACUTE ONLY): 37 min   Charges:   PT Evaluation $PT Eval Moderate Complexity: 1 Procedure PT Treatments $Therapeutic Activity: 8-22 mins   PT G Codes:        [name] 07/19/2016, 1:35 PM

## 2016-07-20 MED ORDER — DIPHENOXYLATE-ATROPINE 2.5-0.025 MG PO TABS: 1.0000 | ORAL_TABLET | Freq: Four times a day (QID) | ORAL | 0 refills | Status: DC | PRN

## 2016-07-20 MED ORDER — ZOLPIDEM TARTRATE 5 MG PO TABS: 5.0000 mg | ORAL_TABLET | Freq: Every evening | ORAL | 0 refills | Status: DC | PRN

## 2016-07-20 MED ORDER — LACTINEX PO CHEW: 1.0000 | CHEWABLE_TABLET | Freq: Three times a day (TID) | ORAL | 0 refills | Status: DC

## 2016-07-20 NOTE — Progress Notes (Signed)
07/20/2016. 11:04 AM  Progress Note  Pt seen and examined.  Pt waiting on SNF bed.  Pt stable for discharge to SNF.    Vitals:   07/19/16 2055 07/20/16 0538  BP: (!) 159/107 (!) 123/58  Pulse: (!) 102 95  Resp: 18 18  Temp: 98 F (36.7 C) 98.1 F (36.7 C)   Results for orders placed or performed during the hospital encounter of 07/14/16  C difficile quick scan w PCR reflex  Result Value Ref Range   C Diff antigen NEGATIVE NEGATIVE   C Diff toxin NEGATIVE NEGATIVE   C Diff interpretation No C. difficile detected.   Urine culture  Result Value Ref Range   Specimen Description URINE, RANDOM    Special Requests NONE    Culture >=100,000 COLONIES/mL ESCHERICHIA COLI (A)    Report Status 07/17/2016 FINAL    Organism ID, Bacteria ESCHERICHIA COLI (A)       Susceptibility   Escherichia coli - MIC*    AMPICILLIN 4 SENSITIVE Sensitive     CEFAZOLIN <=4 SENSITIVE Sensitive     CEFTRIAXONE <=1 SENSITIVE Sensitive     CIPROFLOXACIN <=0.25 SENSITIVE Sensitive     GENTAMICIN >=16 RESISTANT Resistant     IMIPENEM <=0.25 SENSITIVE Sensitive     NITROFURANTOIN <=16 SENSITIVE Sensitive     TRIMETH/SULFA >=320 RESISTANT Resistant     AMPICILLIN/SULBACTAM 4 SENSITIVE Sensitive     PIP/TAZO <=4 SENSITIVE Sensitive     Extended ESBL NEGATIVE Sensitive     * >=100,000 COLONIES/mL ESCHERICHIA COLI  Gastrointestinal Panel by PCR , Stool  Result Value Ref Range   Campylobacter species NOT DETECTED NOT DETECTED   Plesimonas shigelloides NOT DETECTED NOT DETECTED   Salmonella species NOT DETECTED NOT DETECTED   Yersinia enterocolitica NOT DETECTED NOT DETECTED   Vibrio species NOT DETECTED NOT DETECTED   Vibrio cholerae NOT DETECTED NOT DETECTED   Enteroaggregative E coli (EAEC) NOT DETECTED NOT DETECTED   Enteropathogenic E coli (EPEC) NOT DETECTED NOT DETECTED   Enterotoxigenic E coli (ETEC) NOT DETECTED NOT DETECTED   Shiga like toxin producing E coli (STEC) NOT DETECTED NOT DETECTED   Shigella/Enteroinvasive E coli (EIEC) NOT DETECTED NOT DETECTED   Cryptosporidium NOT DETECTED NOT DETECTED   Cyclospora cayetanensis NOT DETECTED NOT DETECTED   Entamoeba histolytica NOT DETECTED NOT DETECTED   Giardia lamblia NOT DETECTED NOT DETECTED   Adenovirus F40/41 NOT DETECTED NOT DETECTED   Astrovirus NOT DETECTED NOT DETECTED   Norovirus GI/GII NOT DETECTED NOT DETECTED   Rotavirus A NOT DETECTED NOT DETECTED   Sapovirus (I, II, IV, and V) NOT DETECTED NOT DETECTED  Comprehensive metabolic panel  Result Value Ref Range   Sodium 143 135 - 145 mmol/L   Potassium 3.6 3.5 - 5.1 mmol/L   Chloride 113 (H) 101 - 111 mmol/L   CO2 21 (L) 22 - 32 mmol/L   Glucose, Bld 146 (H) 65 - 99 mg/dL   BUN 36 (H) 6 - 20 mg/dL   Creatinine, Ser 1.41 (H) 0.44 - 1.00 mg/dL   Calcium 8.8 (L) 8.9 - 10.3 mg/dL   Total Protein 6.6 6.5 - 8.1 g/dL   Albumin 3.9 3.5 - 5.0 g/dL   AST 30 15 - 41 U/L   ALT 27 14 - 54 U/L   Alkaline Phosphatase 92 38 - 126 U/L   Total Bilirubin 0.6 0.3 - 1.2 mg/dL   GFR calc non Af Amer 35 (L) >60 mL/min   GFR calc Af Wyvonnia Lora  41 (L) >60 mL/min   Anion gap 9 5 - 15  CBC with Differential  Result Value Ref Range   WBC 12.0 (H) 4.0 - 10.5 K/uL   RBC 3.54 (L) 3.87 - 5.11 MIL/uL   Hemoglobin 10.6 (L) 12.0 - 15.0 g/dL   HCT 33.3 (L) 36.0 - 46.0 %   MCV 94.1 78.0 - 100.0 fL   MCH 29.9 26.0 - 34.0 pg   MCHC 31.8 30.0 - 36.0 g/dL   RDW 14.7 11.5 - 15.5 %   Platelets 200 150 - 400 K/uL   Neutrophils Relative % 84 %   Neutro Abs 10.0 (H) 1.7 - 7.7 K/uL   Lymphocytes Relative 7 %   Lymphs Abs 0.9 0.7 - 4.0 K/uL   Monocytes Relative 8 %   Monocytes Absolute 1.0 0.1 - 1.0 K/uL   Eosinophils Relative 1 %   Eosinophils Absolute 0.1 0.0 - 0.7 K/uL   Basophils Relative 0 %   Basophils Absolute 0.0 0.0 - 0.1 K/uL  Urinalysis, Routine w reflex microscopic  Result Value Ref Range   Color, Urine AMBER (A) YELLOW   APPearance CLOUDY (A) CLEAR   Specific Gravity, Urine 1.023  1.005 - 1.030   pH 5.0 5.0 - 8.0   Glucose, UA NEGATIVE NEGATIVE mg/dL   Hgb urine dipstick NEGATIVE NEGATIVE   Bilirubin Urine SMALL (A) NEGATIVE   Ketones, ur NEGATIVE NEGATIVE mg/dL   Protein, ur 100 (A) NEGATIVE mg/dL   Nitrite POSITIVE (A) NEGATIVE   Leukocytes, UA SMALL (A) NEGATIVE  Urine microscopic-add on  Result Value Ref Range   Squamous Epithelial / LPF NONE SEEN NONE SEEN   WBC, UA 6-30 0 - 5 WBC/hpf   RBC / HPF 0-5 0 - 5 RBC/hpf   Bacteria, UA MANY (A) NONE SEEN   Casts HYALINE CASTS (A) NEGATIVE  CBC  Result Value Ref Range   WBC 12.8 (H) 4.0 - 10.5 K/uL   RBC 3.33 (L) 3.87 - 5.11 MIL/uL   Hemoglobin 10.1 (L) 12.0 - 15.0 g/dL   HCT 31.1 (L) 36.0 - 46.0 %   MCV 93.4 78.0 - 100.0 fL   MCH 30.3 26.0 - 34.0 pg   MCHC 32.5 30.0 - 36.0 g/dL   RDW 14.8 11.5 - 15.5 %   Platelets 199 150 - 400 K/uL  Basic metabolic panel  Result Value Ref Range   Sodium 141 135 - 145 mmol/L   Potassium 4.0 3.5 - 5.1 mmol/L   Chloride 109 101 - 111 mmol/L   CO2 23 22 - 32 mmol/L   Glucose, Bld 143 (H) 65 - 99 mg/dL   BUN 33 (H) 6 - 20 mg/dL   Creatinine, Ser 1.15 (H) 0.44 - 1.00 mg/dL   Calcium 8.7 (L) 8.9 - 10.3 mg/dL   GFR calc non Af Amer 45 (L) >60 mL/min   GFR calc Af Amer 52 (L) >60 mL/min   Anion gap 9 5 - 15  Hemoglobin and hematocrit, blood  Result Value Ref Range   Hemoglobin 10.0 (L) 12.0 - 15.0 g/dL   HCT 31.0 (L) 36.0 - 46.0 %  CBC  Result Value Ref Range   WBC 13.1 (H) 4.0 - 10.5 K/uL   RBC 3.00 (L) 3.87 - 5.11 MIL/uL   Hemoglobin 8.9 (L) 12.0 - 15.0 g/dL   HCT 27.3 (L) 36.0 - 46.0 %   MCV 91.0 78.0 - 100.0 fL   MCH 29.7 26.0 - 34.0 pg   MCHC 32.6 30.0 -  36.0 g/dL   RDW 14.6 11.5 - 15.5 %   Platelets 173 150 - 400 K/uL  Basic metabolic panel  Result Value Ref Range   Sodium 138 135 - 145 mmol/L   Potassium 3.3 (L) 3.5 - 5.1 mmol/L   Chloride 109 101 - 111 mmol/L   CO2 23 22 - 32 mmol/L   Glucose, Bld 111 (H) 65 - 99 mg/dL   BUN 22 (H) 6 - 20 mg/dL    Creatinine, Ser 1.15 (H) 0.44 - 1.00 mg/dL   Calcium 7.9 (L) 8.9 - 10.3 mg/dL   GFR calc non Af Amer 45 (L) >60 mL/min   GFR calc Af Amer 52 (L) >60 mL/min   Anion gap 6 5 - 15  Hemoglobin and hematocrit, blood  Result Value Ref Range   Hemoglobin 8.6 (L) 12.0 - 15.0 g/dL   HCT 26.6 (L) 36.0 - 46.0 %  CBC  Result Value Ref Range   WBC 8.9 4.0 - 10.5 K/uL   RBC 2.78 (L) 3.87 - 5.11 MIL/uL   Hemoglobin 8.2 (L) 12.0 - 15.0 g/dL   HCT 25.9 (L) 36.0 - 46.0 %   MCV 93.2 78.0 - 100.0 fL   MCH 29.5 26.0 - 34.0 pg   MCHC 31.7 30.0 - 36.0 g/dL   RDW 15.1 11.5 - 15.5 %   Platelets 154 150 - 400 K/uL  Basic metabolic panel  Result Value Ref Range   Sodium 138 135 - 145 mmol/L   Potassium 4.4 3.5 - 5.1 mmol/L   Chloride 111 101 - 111 mmol/L   CO2 21 (L) 22 - 32 mmol/L   Glucose, Bld 105 (H) 65 - 99 mg/dL   BUN 22 (H) 6 - 20 mg/dL   Creatinine, Ser 1.48 (H) 0.44 - 1.00 mg/dL   Calcium 7.5 (L) 8.9 - 10.3 mg/dL   GFR calc non Af Amer 33 (L) >60 mL/min   GFR calc Af Amer 38 (L) >60 mL/min   Anion gap 6 5 - 15  CBC  Result Value Ref Range   WBC 8.1 4.0 - 10.5 K/uL   RBC 3.07 (L) 3.87 - 5.11 MIL/uL   Hemoglobin 9.1 (L) 12.0 - 15.0 g/dL   HCT 28.8 (L) 36.0 - 46.0 %   MCV 93.8 78.0 - 100.0 fL   MCH 29.6 26.0 - 34.0 pg   MCHC 31.6 30.0 - 36.0 g/dL   RDW 15.2 11.5 - 15.5 %   Platelets 165 150 - 400 K/uL  Comprehensive metabolic panel  Result Value Ref Range   Sodium 136 135 - 145 mmol/L   Potassium 4.5 3.5 - 5.1 mmol/L   Chloride 110 101 - 111 mmol/L   CO2 18 (L) 22 - 32 mmol/L   Glucose, Bld 131 (H) 65 - 99 mg/dL   BUN 22 (H) 6 - 20 mg/dL   Creatinine, Ser 1.51 (H) 0.44 - 1.00 mg/dL   Calcium 7.8 (L) 8.9 - 10.3 mg/dL   Total Protein 5.9 (L) 6.5 - 8.1 g/dL   Albumin 3.3 (L) 3.5 - 5.0 g/dL   AST 35 15 - 41 U/L   ALT 29 14 - 54 U/L   Alkaline Phosphatase 62 38 - 126 U/L   Total Bilirubin 0.6 0.3 - 1.2 mg/dL   GFR calc non Af Amer 32 (L) >60 mL/min   GFR calc Af Amer 38 (L) >60  mL/min   Anion gap 8 5 - 15  Troponin I  Result Value Ref Range  Troponin I <0.03 <0.03 ng/mL  Lactic acid, plasma  Result Value Ref Range   Lactic Acid, Venous 0.9 0.5 - 1.9 mmol/L  Glucose, capillary  Result Value Ref Range   Glucose-Capillary 128 (H) 65 - 99 mg/dL  CBC  Result Value Ref Range   WBC 8.0 4.0 - 10.5 K/uL   RBC 2.80 (L) 3.87 - 5.11 MIL/uL   Hemoglobin 8.5 (L) 12.0 - 15.0 g/dL   HCT 26.1 (L) 36.0 - 46.0 %   MCV 93.2 78.0 - 100.0 fL   MCH 30.4 26.0 - 34.0 pg   MCHC 32.6 30.0 - 36.0 g/dL   RDW 15.0 11.5 - 15.5 %   Platelets 167 150 - 400 K/uL  Basic metabolic panel  Result Value Ref Range   Sodium 141 135 - 145 mmol/L   Potassium 3.9 3.5 - 5.1 mmol/L   Chloride 112 (H) 101 - 111 mmol/L   CO2 22 22 - 32 mmol/L   Glucose, Bld 112 (H) 65 - 99 mg/dL   BUN 19 6 - 20 mg/dL   Creatinine, Ser 1.33 (H) 0.44 - 1.00 mg/dL   Calcium 8.0 (L) 8.9 - 10.3 mg/dL   GFR calc non Af Amer 38 (L) >60 mL/min   GFR calc Af Amer 44 (L) >60 mL/min   Anion gap 7 5 - 15  I-stat troponin, ED  Result Value Ref Range   Troponin i, poc 0.00 0.00 - 0.08 ng/mL   Comment 3          Occult blood, poc device  Result Value Ref Range   Fecal Occult Bld POSITIVE (A) NEGATIVE    Gen: awake, alert, no distress Heent: NCAT, MMM Lungs: BBS clear CV: normal s1, s2 sounds Abd: soft, nd/nt, no masses  Murvin Natal, MD [facility]

## 2016-07-20 NOTE — Clinical Social Work Note (Signed)
LCSW spoke with [facility] who attempted to provide bed today for patient but could not without pasaar number.  Patient should have a bed in the morning if pasaar number comes in.  Additional paperwork has been provided in [facility] MUST system.  LCSW met with patient to let her know that [facility] would have a bed for her tomorrow providing that pasaar number comes in.  Dede Query, LCSW [place] Worker - Weekend Coverage cell #: [phone]

## 2016-07-20 NOTE — Care Management Important Message (Signed)
Important Message  Patient Details  Name: Tamara Stein MRN: [id number] Date of Birth: 05-Feb-1941   Medicare Important Message Given:  Yes    Erenest Rasher, RN 07/20/2016, 10:18 AM

## 2016-07-20 NOTE — Clinical Social Work Note (Signed)
LCSW faxed additional paperwork requested by [place] MUST in order to obtain pasaar number.  Awaiting this number to be able to discharge to SNF.  Marland KitchenDede Query, LCSW [facility] Clinical Social Worker - Weekend Coverage cell #: [phone]

## 2016-07-20 NOTE — Care Management Note (Signed)
Case Management Note  Patient Details  Name: Tamara Stein MRN: [id number] Date of Birth: 12-31-1940  Subjective/Objective:      Colitis, UTI, HTN           Action/Plan: Discharge Planning: AVS reviewed: CSW following for SNF placement. Chart reviewed.   PCP  Claretta Fraise MD   Expected Discharge Date:  07/20/2016           Expected Discharge Plan:  Skilled Nursing Facility  In-House Referral:  Clinical Social Work  Discharge planning Services  CM Consult  Post Acute Care Choice:  NA Choice offered to:  NA  DME Arranged:  N/A DME Agency:  NA  HH Arranged:  NA HH Agency:  NA  Status of Service:  Completed, signed off  If discussed at [place] of Stay Meetings, dates discussed:    Additional Comments:  Erenest Rasher, RN 07/20/2016, 10:19 AM

## 2016-07-21 DIAGNOSIS — E58 Dietary calcium deficiency: Secondary | ICD-10-CM | POA: Diagnosis not present

## 2016-07-21 DIAGNOSIS — E782 Mixed hyperlipidemia: Secondary | ICD-10-CM | POA: Diagnosis not present

## 2016-07-21 DIAGNOSIS — D649 Anemia, unspecified: Secondary | ICD-10-CM | POA: Diagnosis not present

## 2016-07-21 DIAGNOSIS — R109 Unspecified abdominal pain: Secondary | ICD-10-CM | POA: Diagnosis not present

## 2016-07-21 DIAGNOSIS — R1311 Dysphagia, oral phase: Secondary | ICD-10-CM | POA: Diagnosis not present

## 2016-07-21 DIAGNOSIS — G4700 Insomnia, unspecified: Secondary | ICD-10-CM | POA: Diagnosis not present

## 2016-07-21 DIAGNOSIS — G319 Degenerative disease of nervous system, unspecified: Secondary | ICD-10-CM | POA: Diagnosis not present

## 2016-07-21 DIAGNOSIS — N390 Urinary tract infection, site not specified: Secondary | ICD-10-CM | POA: Diagnosis not present

## 2016-07-21 DIAGNOSIS — E039 Hypothyroidism, unspecified: Secondary | ICD-10-CM | POA: Diagnosis not present

## 2016-07-21 DIAGNOSIS — J309 Allergic rhinitis, unspecified: Secondary | ICD-10-CM | POA: Diagnosis not present

## 2016-07-21 DIAGNOSIS — Z79899 Other long term (current) drug therapy: Secondary | ICD-10-CM | POA: Diagnosis not present

## 2016-07-21 DIAGNOSIS — E785 Hyperlipidemia, unspecified: Secondary | ICD-10-CM | POA: Diagnosis not present

## 2016-07-21 DIAGNOSIS — H8149 Vertigo of central origin, unspecified ear: Secondary | ICD-10-CM | POA: Diagnosis not present

## 2016-07-21 DIAGNOSIS — E611 Iron deficiency: Secondary | ICD-10-CM | POA: Diagnosis not present

## 2016-07-21 DIAGNOSIS — E7800 Pure hypercholesterolemia, unspecified: Secondary | ICD-10-CM | POA: Diagnosis not present

## 2016-07-21 DIAGNOSIS — K529 Noninfective gastroenteritis and colitis, unspecified: Secondary | ICD-10-CM | POA: Diagnosis not present

## 2016-07-21 DIAGNOSIS — R197 Diarrhea, unspecified: Secondary | ICD-10-CM | POA: Diagnosis not present

## 2016-07-21 DIAGNOSIS — M6281 Muscle weakness (generalized): Secondary | ICD-10-CM | POA: Diagnosis not present

## 2016-07-21 DIAGNOSIS — M810 Age-related osteoporosis without current pathological fracture: Secondary | ICD-10-CM | POA: Diagnosis not present

## 2016-07-21 DIAGNOSIS — I10 Essential (primary) hypertension: Secondary | ICD-10-CM | POA: Diagnosis not present

## 2016-07-21 DIAGNOSIS — R471 Dysarthria and anarthria: Secondary | ICD-10-CM | POA: Diagnosis not present

## 2016-07-21 DIAGNOSIS — K219 Gastro-esophageal reflux disease without esophagitis: Secondary | ICD-10-CM | POA: Diagnosis not present

## 2016-07-21 DIAGNOSIS — G629 Polyneuropathy, unspecified: Secondary | ICD-10-CM | POA: Diagnosis not present

## 2016-07-21 NOTE — Progress Notes (Signed)
Report called to [place] at [facility].

## 2016-07-21 NOTE — Progress Notes (Signed)
LCSWA confirmed bed at [facility].  LCSWA spoke to admissions [facility] and provided d/c summary and PASSR. Patient caretaker Donnald Garre reports she will Transport patient to facility.  Patient Packet, report number on pt. Chart. RN informed.  No other needs identified at this time.   Kathrin Greathouse, Latanya Presser, MSW Clinical Social Worker 5E and Psychiatric Service Line [phone] 07/21/2016  10:33 AM

## 2016-07-25 DIAGNOSIS — N181 Chronic kidney disease, stage 1: Secondary | ICD-10-CM | POA: Diagnosis not present

## 2016-07-25 DIAGNOSIS — E039 Hypothyroidism, unspecified: Secondary | ICD-10-CM | POA: Diagnosis not present

## 2016-07-25 DIAGNOSIS — K529 Noninfective gastroenteritis and colitis, unspecified: Secondary | ICD-10-CM | POA: Diagnosis not present

## 2016-07-25 DIAGNOSIS — D631 Anemia in chronic kidney disease: Secondary | ICD-10-CM | POA: Diagnosis not present

## 2016-07-28 ENCOUNTER — Telehealth: Payer: Self-pay | Admitting: Family Medicine

## 2016-07-28 DIAGNOSIS — G319 Degenerative disease of nervous system, unspecified: Secondary | ICD-10-CM | POA: Diagnosis not present

## 2016-07-28 DIAGNOSIS — F419 Anxiety disorder, unspecified: Secondary | ICD-10-CM | POA: Diagnosis not present

## 2016-07-28 DIAGNOSIS — F329 Major depressive disorder, single episode, unspecified: Secondary | ICD-10-CM | POA: Diagnosis not present

## 2016-07-28 DIAGNOSIS — Z7982 Long term (current) use of aspirin: Secondary | ICD-10-CM | POA: Diagnosis not present

## 2016-07-28 DIAGNOSIS — K5150 Left sided colitis without complications: Secondary | ICD-10-CM | POA: Diagnosis not present

## 2016-07-28 DIAGNOSIS — D500 Iron deficiency anemia secondary to blood loss (chronic): Secondary | ICD-10-CM | POA: Diagnosis not present

## 2016-07-28 DIAGNOSIS — I129 Hypertensive chronic kidney disease with stage 1 through stage 4 chronic kidney disease, or unspecified chronic kidney disease: Secondary | ICD-10-CM | POA: Diagnosis not present

## 2016-07-28 DIAGNOSIS — R4701 Aphasia: Secondary | ICD-10-CM | POA: Diagnosis not present

## 2016-07-28 DIAGNOSIS — Z742 Need for assistance at home and no other household member able to render care: Secondary | ICD-10-CM | POA: Diagnosis not present

## 2016-07-28 DIAGNOSIS — N189 Chronic kidney disease, unspecified: Secondary | ICD-10-CM | POA: Diagnosis not present

## 2016-07-28 MED ORDER — FUROSEMIDE 20 MG PO TABS: 20.0000 mg | ORAL_TABLET | Freq: Every day | ORAL | 1 refills | Status: DC

## 2016-07-28 NOTE — Telephone Encounter (Signed)
Please contact the patient furosemide has been sent to her pharmacy

## 2016-07-28 NOTE — Telephone Encounter (Signed)
Home health is requesting fluid pill for lower leg edema to be called in, there is not one on her med list.  Left mssg on pt's voicemail to make appt this week for follow up & for the edema.

## 2016-07-29 ENCOUNTER — Telehealth: Payer: Self-pay | Admitting: Pediatrics

## 2016-07-29 NOTE — Telephone Encounter (Signed)
TC from pt with c/o bilateral edema lower legs Pt did take fluid pill last PM Swelling is some better this AM but still swollen Pt was on scooter all day yesterday Denies redness or pain Pt instructed to keep legs elevated today Continue fluid pill Call if swelling worsens or develops any other sxs Keep appt on Friday

## 2016-07-30 DIAGNOSIS — G319 Degenerative disease of nervous system, unspecified: Secondary | ICD-10-CM | POA: Diagnosis not present

## 2016-07-30 DIAGNOSIS — K5150 Left sided colitis without complications: Secondary | ICD-10-CM | POA: Diagnosis not present

## 2016-07-30 DIAGNOSIS — F329 Major depressive disorder, single episode, unspecified: Secondary | ICD-10-CM | POA: Diagnosis not present

## 2016-07-30 DIAGNOSIS — I129 Hypertensive chronic kidney disease with stage 1 through stage 4 chronic kidney disease, or unspecified chronic kidney disease: Secondary | ICD-10-CM | POA: Diagnosis not present

## 2016-07-30 DIAGNOSIS — R4701 Aphasia: Secondary | ICD-10-CM | POA: Diagnosis not present

## 2016-07-30 DIAGNOSIS — D500 Iron deficiency anemia secondary to blood loss (chronic): Secondary | ICD-10-CM | POA: Diagnosis not present

## 2016-07-31 ENCOUNTER — Encounter (INDEPENDENT_AMBULATORY_CARE_PROVIDER_SITE_OTHER): Payer: Medicare Other

## 2016-07-31 ENCOUNTER — Ambulatory Visit (INDEPENDENT_AMBULATORY_CARE_PROVIDER_SITE_OTHER): Payer: Medicare Other

## 2016-07-31 ENCOUNTER — Other Ambulatory Visit: Payer: Self-pay | Admitting: Orthopedic Surgery

## 2016-07-31 ENCOUNTER — Ambulatory Visit: Payer: Medicare Other | Admitting: Pediatrics

## 2016-07-31 DIAGNOSIS — M25562 Pain in left knee: Secondary | ICD-10-CM | POA: Diagnosis not present

## 2016-07-31 DIAGNOSIS — R52 Pain, unspecified: Secondary | ICD-10-CM

## 2016-07-31 DIAGNOSIS — M1712 Unilateral primary osteoarthritis, left knee: Secondary | ICD-10-CM | POA: Diagnosis not present

## 2016-07-31 DIAGNOSIS — F329 Major depressive disorder, single episode, unspecified: Secondary | ICD-10-CM | POA: Diagnosis not present

## 2016-07-31 DIAGNOSIS — R4701 Aphasia: Secondary | ICD-10-CM | POA: Diagnosis not present

## 2016-07-31 DIAGNOSIS — G319 Degenerative disease of nervous system, unspecified: Secondary | ICD-10-CM | POA: Diagnosis not present

## 2016-07-31 DIAGNOSIS — D500 Iron deficiency anemia secondary to blood loss (chronic): Secondary | ICD-10-CM | POA: Diagnosis not present

## 2016-07-31 DIAGNOSIS — M1711 Unilateral primary osteoarthritis, right knee: Secondary | ICD-10-CM | POA: Diagnosis not present

## 2016-07-31 DIAGNOSIS — I129 Hypertensive chronic kidney disease with stage 1 through stage 4 chronic kidney disease, or unspecified chronic kidney disease: Secondary | ICD-10-CM | POA: Diagnosis not present

## 2016-07-31 DIAGNOSIS — K5150 Left sided colitis without complications: Secondary | ICD-10-CM | POA: Diagnosis not present

## 2016-07-31 DIAGNOSIS — M170 Bilateral primary osteoarthritis of knee: Secondary | ICD-10-CM | POA: Diagnosis not present

## 2016-08-01 ENCOUNTER — Ambulatory Visit (INDEPENDENT_AMBULATORY_CARE_PROVIDER_SITE_OTHER): Payer: Medicare Other | Admitting: Pediatrics

## 2016-08-01 ENCOUNTER — Encounter: Payer: Self-pay | Admitting: Pediatrics

## 2016-08-01 VITALS — BP 159/80 | HR 88 | Temp 98.1°F | Ht 60.0 in

## 2016-08-01 DIAGNOSIS — E039 Hypothyroidism, unspecified: Secondary | ICD-10-CM | POA: Diagnosis not present

## 2016-08-01 DIAGNOSIS — I10 Essential (primary) hypertension: Secondary | ICD-10-CM

## 2016-08-01 DIAGNOSIS — R609 Edema, unspecified: Secondary | ICD-10-CM

## 2016-08-01 DIAGNOSIS — D62 Acute posthemorrhagic anemia: Secondary | ICD-10-CM

## 2016-08-01 DIAGNOSIS — G319 Degenerative disease of nervous system, unspecified: Secondary | ICD-10-CM

## 2016-08-01 DIAGNOSIS — N179 Acute kidney failure, unspecified: Secondary | ICD-10-CM

## 2016-08-01 DIAGNOSIS — M1710 Unilateral primary osteoarthritis, unspecified knee: Secondary | ICD-10-CM | POA: Diagnosis not present

## 2016-08-01 LAB — URINALYSIS, COMPLETE
Bilirubin, UA: NEGATIVE
Glucose, UA: NEGATIVE
Ketones, UA: NEGATIVE
Nitrite, UA: NEGATIVE
PH UA: 5 (ref 5.0–7.5)
Specific Gravity, UA: 1.02 (ref 1.005–1.030)
Urobilinogen, Ur: 0.2 mg/dL (ref 0.2–1.0)

## 2016-08-01 LAB — MICROSCOPIC EXAMINATION
Epithelial Cells (non renal): >10 /hpf — AB (ref 0–10)
RENAL EPITHEL UA: NONE SEEN /HPF

## 2016-08-01 NOTE — Patient Instructions (Signed)
Take lasix daily, 57m OK to take 426monce a day in the morning if still having swelling Let me know if not getting better

## 2016-08-01 NOTE — Progress Notes (Signed)
  Subjective:   Patient ID: Tamara Stein, female    DOB: 14-Mar-1941, 75 y.o.   MRN: [id number] CC: Hospitalization Follow-up (Fall) and Foot Swelling (Bilateral)  HPI: Tamara Stein is a 75 y.o. female presenting for Hospitalization Follow-up (Fall) and Foot Swelling (Bilateral)  Recent admission for UTI and colitis, likely ischemic colitis. Had several bloody stools whil ein the hospital, resolved by time of discharge WBC improved by time of discharge Was tolerating food Continued on anitbiotics cipro and flagyl for UTI coverage and possible infectious colitis  Seen yesterday by [facility] ortho for knee pain Got steroid knee injections, feeling better  Leg swelling started apprx 4 days ago after getting home Equal both legs Slightly better today after starting lasix a coupl eof days ago Cant prop legs up because of arthritis, cant straighten legs completely  No more UTI symptoms No fevers Appetite has been fine Feeling well now No change in focal neuro symptoms  Relevant past medical, surgical, family and social history reviewed. Allergies and medications reviewed and updated. History  Smoking Status  . Never Smoker  Smokeless Tobacco  . Never Used   ROS: Per HPI   Objective:    BP (!) 159/80   Pulse 88   Temp 98.1 F (36.7 C) (Oral)   Ht 5' (1.524 m)   Wt Readings from Last 3 Encounters:  07/01/16 202 lb (91.6 kg)  05/19/16 212 lb (96.2 kg)  04/28/16 209 lb 4.8 oz (94.9 kg)    Gen: NAD, alert, cooperative with exam, NCAT EYES: EOMI, no conjunctival injection, or no icterus CV: NRRR, normal S1/S2 Resp: CTABL, no wheezes, normal WOB Abd: +BS, soft, NTND. no guarding or organomegaly Ext: non-pitting edema present b/l feet and lower legs, warm Neuro: Alert and oriented Assessment & Plan:  Tamara Stein was seen today for hospitalization follow-up and foot swelling.  Diagnoses and all orders for this visit:  AKI (acute kidney injury) ([place]) Follow up Cr -      BMP8+EGFR  Acute blood loss anemia No more bloody stools Recheck Hg -     CBC with Differential/Platelet  Essential hypertension Elevated today, check at home, bring BP numbers to next visit  Cerebellar degeneration Stable symtpoms  Arthritis of knee S/p knee injections, improved  Hypothyroidism, unspecified type Cont synthroid  Peripheral edema Cont lasix daily May increase to 50m in the morning if swelling not improving Return for repeat labs 2 weeks Not able to elevate legs, bed bound/wheel chair bound, not able to straighten legs  Follow up plan: Return in about 2 weeks (around 08/15/2016). CAssunta Found MD [place]

## 2016-08-02 LAB — BMP8+EGFR
BUN/Creatinine Ratio: 15 (ref 12–28)
BUN: 16 mg/dL (ref 8–27)
CALCIUM: 9.2 mg/dL (ref 8.7–10.3)
CO2: 35 mmol/L — AB (ref 18–29)
CREATININE: 1.04 mg/dL — AB (ref 0.57–1.00)
Chloride: 105 mmol/L (ref 96–106)
GFR, EST AFRICAN AMERICAN: 60 mL/min/{1.73_m2} (ref >59)
GFR, EST NON AFRICAN AMERICAN: 52 mL/min/{1.73_m2} — AB (ref >59)
Glucose: 104 mg/dL — ABNORMAL HIGH (ref 65–99)
Potassium: 4.2 mmol/L (ref 3.5–5.2)
SODIUM: 148 mmol/L — AB (ref 134–144)

## 2016-08-02 LAB — CBC WITH DIFFERENTIAL/PLATELET
BASOS: 1 %
Basophils Absolute: 0.1 10*3/uL (ref 0.0–0.2)
EOS (ABSOLUTE): 0 10*3/uL (ref 0.0–0.4)
EOS: 1 %
HEMATOCRIT: 30.4 % — AB (ref 34.0–46.6)
HEMOGLOBIN: 10 g/dL — AB (ref 11.1–15.9)
Immature Grans (Abs): 0 10*3/uL (ref 0.0–0.1)
Immature Granulocytes: 0 %
LYMPHS ABS: 0.8 10*3/uL (ref 0.7–3.1)
Lymphs: 14 %
MCH: 29.3 pg (ref 26.6–33.0)
MCHC: 32.9 g/dL (ref 31.5–35.7)
MCV: 89 fL (ref 79–97)
MONOCYTES: 15 %
Monocytes Absolute: 0.9 10*3/uL (ref 0.1–0.9)
Neutrophils Absolute: 4.4 10*3/uL (ref 1.4–7.0)
Neutrophils: 69 %
Platelets: 264 10*3/uL (ref 150–379)
RBC: 3.41 x10E6/uL — AB (ref 3.77–5.28)
RDW: 16.5 % — ABNORMAL HIGH (ref 12.3–15.4)
WBC: 6.2 10*3/uL (ref 3.4–10.8)

## 2016-08-05 DIAGNOSIS — I129 Hypertensive chronic kidney disease with stage 1 through stage 4 chronic kidney disease, or unspecified chronic kidney disease: Secondary | ICD-10-CM | POA: Diagnosis not present

## 2016-08-05 DIAGNOSIS — D500 Iron deficiency anemia secondary to blood loss (chronic): Secondary | ICD-10-CM | POA: Diagnosis not present

## 2016-08-05 DIAGNOSIS — G319 Degenerative disease of nervous system, unspecified: Secondary | ICD-10-CM | POA: Diagnosis not present

## 2016-08-05 DIAGNOSIS — K5150 Left sided colitis without complications: Secondary | ICD-10-CM | POA: Diagnosis not present

## 2016-08-05 DIAGNOSIS — F329 Major depressive disorder, single episode, unspecified: Secondary | ICD-10-CM | POA: Diagnosis not present

## 2016-08-05 DIAGNOSIS — R4701 Aphasia: Secondary | ICD-10-CM | POA: Diagnosis not present

## 2016-08-06 DIAGNOSIS — D500 Iron deficiency anemia secondary to blood loss (chronic): Secondary | ICD-10-CM | POA: Diagnosis not present

## 2016-08-06 DIAGNOSIS — F329 Major depressive disorder, single episode, unspecified: Secondary | ICD-10-CM | POA: Diagnosis not present

## 2016-08-06 DIAGNOSIS — K5150 Left sided colitis without complications: Secondary | ICD-10-CM | POA: Diagnosis not present

## 2016-08-06 DIAGNOSIS — G319 Degenerative disease of nervous system, unspecified: Secondary | ICD-10-CM | POA: Diagnosis not present

## 2016-08-06 DIAGNOSIS — I129 Hypertensive chronic kidney disease with stage 1 through stage 4 chronic kidney disease, or unspecified chronic kidney disease: Secondary | ICD-10-CM | POA: Diagnosis not present

## 2016-08-06 DIAGNOSIS — R4701 Aphasia: Secondary | ICD-10-CM | POA: Diagnosis not present

## 2016-08-07 DIAGNOSIS — F329 Major depressive disorder, single episode, unspecified: Secondary | ICD-10-CM | POA: Diagnosis not present

## 2016-08-07 DIAGNOSIS — K5150 Left sided colitis without complications: Secondary | ICD-10-CM | POA: Diagnosis not present

## 2016-08-07 DIAGNOSIS — R4701 Aphasia: Secondary | ICD-10-CM | POA: Diagnosis not present

## 2016-08-07 DIAGNOSIS — I129 Hypertensive chronic kidney disease with stage 1 through stage 4 chronic kidney disease, or unspecified chronic kidney disease: Secondary | ICD-10-CM | POA: Diagnosis not present

## 2016-08-07 DIAGNOSIS — D500 Iron deficiency anemia secondary to blood loss (chronic): Secondary | ICD-10-CM | POA: Diagnosis not present

## 2016-08-07 DIAGNOSIS — G319 Degenerative disease of nervous system, unspecified: Secondary | ICD-10-CM | POA: Diagnosis not present

## 2016-08-08 DIAGNOSIS — K5150 Left sided colitis without complications: Secondary | ICD-10-CM | POA: Diagnosis not present

## 2016-08-08 DIAGNOSIS — D500 Iron deficiency anemia secondary to blood loss (chronic): Secondary | ICD-10-CM | POA: Diagnosis not present

## 2016-08-08 DIAGNOSIS — I129 Hypertensive chronic kidney disease with stage 1 through stage 4 chronic kidney disease, or unspecified chronic kidney disease: Secondary | ICD-10-CM | POA: Diagnosis not present

## 2016-08-08 DIAGNOSIS — G319 Degenerative disease of nervous system, unspecified: Secondary | ICD-10-CM | POA: Diagnosis not present

## 2016-08-08 DIAGNOSIS — F329 Major depressive disorder, single episode, unspecified: Secondary | ICD-10-CM | POA: Diagnosis not present

## 2016-08-08 DIAGNOSIS — R4701 Aphasia: Secondary | ICD-10-CM | POA: Diagnosis not present

## 2016-08-12 DIAGNOSIS — F329 Major depressive disorder, single episode, unspecified: Secondary | ICD-10-CM | POA: Diagnosis not present

## 2016-08-12 DIAGNOSIS — R4701 Aphasia: Secondary | ICD-10-CM | POA: Diagnosis not present

## 2016-08-12 DIAGNOSIS — G319 Degenerative disease of nervous system, unspecified: Secondary | ICD-10-CM | POA: Diagnosis not present

## 2016-08-12 DIAGNOSIS — I129 Hypertensive chronic kidney disease with stage 1 through stage 4 chronic kidney disease, or unspecified chronic kidney disease: Secondary | ICD-10-CM | POA: Diagnosis not present

## 2016-08-12 DIAGNOSIS — D500 Iron deficiency anemia secondary to blood loss (chronic): Secondary | ICD-10-CM | POA: Diagnosis not present

## 2016-08-12 DIAGNOSIS — K5150 Left sided colitis without complications: Secondary | ICD-10-CM | POA: Diagnosis not present

## 2016-08-13 DIAGNOSIS — F329 Major depressive disorder, single episode, unspecified: Secondary | ICD-10-CM | POA: Diagnosis not present

## 2016-08-13 DIAGNOSIS — G319 Degenerative disease of nervous system, unspecified: Secondary | ICD-10-CM | POA: Diagnosis not present

## 2016-08-13 DIAGNOSIS — I129 Hypertensive chronic kidney disease with stage 1 through stage 4 chronic kidney disease, or unspecified chronic kidney disease: Secondary | ICD-10-CM | POA: Diagnosis not present

## 2016-08-13 DIAGNOSIS — R4701 Aphasia: Secondary | ICD-10-CM | POA: Diagnosis not present

## 2016-08-13 DIAGNOSIS — K5150 Left sided colitis without complications: Secondary | ICD-10-CM | POA: Diagnosis not present

## 2016-08-13 DIAGNOSIS — D500 Iron deficiency anemia secondary to blood loss (chronic): Secondary | ICD-10-CM | POA: Diagnosis not present

## 2016-08-14 DIAGNOSIS — I129 Hypertensive chronic kidney disease with stage 1 through stage 4 chronic kidney disease, or unspecified chronic kidney disease: Secondary | ICD-10-CM | POA: Diagnosis not present

## 2016-08-14 DIAGNOSIS — G319 Degenerative disease of nervous system, unspecified: Secondary | ICD-10-CM | POA: Diagnosis not present

## 2016-08-14 DIAGNOSIS — D500 Iron deficiency anemia secondary to blood loss (chronic): Secondary | ICD-10-CM | POA: Diagnosis not present

## 2016-08-14 DIAGNOSIS — F329 Major depressive disorder, single episode, unspecified: Secondary | ICD-10-CM | POA: Diagnosis not present

## 2016-08-14 DIAGNOSIS — R4701 Aphasia: Secondary | ICD-10-CM | POA: Diagnosis not present

## 2016-08-14 DIAGNOSIS — K5150 Left sided colitis without complications: Secondary | ICD-10-CM | POA: Diagnosis not present

## 2016-08-15 ENCOUNTER — Telehealth: Payer: Self-pay | Admitting: *Deleted

## 2016-08-15 DIAGNOSIS — K5150 Left sided colitis without complications: Secondary | ICD-10-CM | POA: Diagnosis not present

## 2016-08-15 DIAGNOSIS — R4701 Aphasia: Secondary | ICD-10-CM | POA: Diagnosis not present

## 2016-08-15 DIAGNOSIS — I10 Essential (primary) hypertension: Secondary | ICD-10-CM

## 2016-08-15 DIAGNOSIS — F329 Major depressive disorder, single episode, unspecified: Secondary | ICD-10-CM | POA: Diagnosis not present

## 2016-08-15 DIAGNOSIS — D500 Iron deficiency anemia secondary to blood loss (chronic): Secondary | ICD-10-CM | POA: Diagnosis not present

## 2016-08-15 DIAGNOSIS — I129 Hypertensive chronic kidney disease with stage 1 through stage 4 chronic kidney disease, or unspecified chronic kidney disease: Secondary | ICD-10-CM | POA: Diagnosis not present

## 2016-08-15 DIAGNOSIS — G319 Degenerative disease of nervous system, unspecified: Secondary | ICD-10-CM | POA: Diagnosis not present

## 2016-08-15 MED ORDER — LISINOPRIL 30 MG PO TABS: 30.0000 mg | ORAL_TABLET | Freq: Every day | ORAL | 1 refills | Status: DC

## 2016-08-15 NOTE — Telephone Encounter (Signed)
Pt is currently taking Amlodipine 50m daily and Lisinopril 236mdaily

## 2016-08-15 NOTE — Telephone Encounter (Signed)
Tamara Stein with [facility] called to report pt's BP today was 158/88 Pt is not symptomatic Denies CP or SOB Please advise

## 2016-08-15 NOTE — Telephone Encounter (Signed)
Is she taking amlodipine 77m, and lisinopril 253mdaily? Start that if not, can increase lisinopril to 3026maily will need new Rx went in if already on above.

## 2016-08-15 NOTE — Telephone Encounter (Signed)
[name] caregiver notified of recommendation New RX sent into pharmacy per Dr Evette Doffing

## 2016-08-16 ENCOUNTER — Emergency Department (HOSPITAL_COMMUNITY)
Admission: EM | Admit: 2016-08-16 | Discharge: 2016-08-16 | Disposition: A | Discharge status: Home / Self Care | Payer: Medicare Other | Attending: Emergency Medicine | Admitting: Emergency Medicine

## 2016-08-16 ENCOUNTER — Encounter (HOSPITAL_COMMUNITY): Payer: Self-pay

## 2016-08-16 ENCOUNTER — Emergency Department (HOSPITAL_COMMUNITY): Payer: Medicare Other

## 2016-08-16 DIAGNOSIS — N189 Chronic kidney disease, unspecified: Secondary | ICD-10-CM | POA: Insufficient documentation

## 2016-08-16 DIAGNOSIS — I129 Hypertensive chronic kidney disease with stage 1 through stage 4 chronic kidney disease, or unspecified chronic kidney disease: Secondary | ICD-10-CM | POA: Insufficient documentation

## 2016-08-16 DIAGNOSIS — Z7982 Long term (current) use of aspirin: Secondary | ICD-10-CM | POA: Insufficient documentation

## 2016-08-16 DIAGNOSIS — R112 Nausea with vomiting, unspecified: Secondary | ICD-10-CM | POA: Diagnosis not present

## 2016-08-16 DIAGNOSIS — Z743 Need for continuous supervision: Secondary | ICD-10-CM | POA: Diagnosis not present

## 2016-08-16 DIAGNOSIS — R279 Unspecified lack of coordination: Secondary | ICD-10-CM | POA: Diagnosis not present

## 2016-08-16 DIAGNOSIS — Z79899 Other long term (current) drug therapy: Secondary | ICD-10-CM | POA: Diagnosis not present

## 2016-08-16 DIAGNOSIS — R197 Diarrhea, unspecified: Secondary | ICD-10-CM | POA: Diagnosis not present

## 2016-08-16 DIAGNOSIS — R404 Transient alteration of awareness: Secondary | ICD-10-CM | POA: Diagnosis not present

## 2016-08-16 DIAGNOSIS — R531 Weakness: Secondary | ICD-10-CM | POA: Diagnosis not present

## 2016-08-16 DIAGNOSIS — E039 Hypothyroidism, unspecified: Secondary | ICD-10-CM | POA: Insufficient documentation

## 2016-08-16 LAB — HEPATIC FUNCTION PANEL
ALBUMIN: 3.7 g/dL (ref 3.5–5.0)
ALK PHOS: 53 U/L (ref 38–126)
ALT: 36 U/L (ref 14–54)
AST: 27 U/L (ref 15–41)
Bilirubin, Direct: 0.1 mg/dL (ref 0.1–0.5)
Indirect Bilirubin: 0.2 mg/dL — ABNORMAL LOW (ref 0.3–0.9)
TOTAL PROTEIN: 6.6 g/dL (ref 6.5–8.1)
Total Bilirubin: 0.3 mg/dL (ref 0.3–1.2)

## 2016-08-16 LAB — CBC WITH DIFFERENTIAL/PLATELET
BASOS ABS: 0.1 10*3/uL (ref 0.0–0.1)
Basophils Relative: 0 %
Eosinophils Absolute: 0.3 10*3/uL (ref 0.0–0.7)
Eosinophils Relative: 2 %
HCT: 35.2 % — ABNORMAL LOW (ref 36.0–46.0)
HEMOGLOBIN: 11.3 g/dL — AB (ref 12.0–15.0)
LYMPHS PCT: 8 %
Lymphs Abs: 1.1 10*3/uL (ref 0.7–4.0)
MCH: 30.5 pg (ref 26.0–34.0)
MCHC: 32.1 g/dL (ref 30.0–36.0)
MCV: 94.9 fL (ref 78.0–100.0)
MONO ABS: 1.1 10*3/uL — AB (ref 0.1–1.0)
Monocytes Relative: 8 %
NEUTROS ABS: 10.7 10*3/uL — AB (ref 1.7–7.7)
NEUTROS PCT: 82 %
Platelets: 233 10*3/uL (ref 150–400)
RBC: 3.71 MIL/uL — AB (ref 3.87–5.11)
RDW: 16.1 % — ABNORMAL HIGH (ref 11.5–15.5)
WBC: 13.1 10*3/uL — ABNORMAL HIGH (ref 4.0–10.5)

## 2016-08-16 LAB — URINALYSIS, ROUTINE W REFLEX MICROSCOPIC
BILIRUBIN URINE: NEGATIVE
Glucose, UA: NEGATIVE mg/dL
Hgb urine dipstick: NEGATIVE
KETONES UR: 5 mg/dL — AB
NITRITE: NEGATIVE
Protein, ur: 100 mg/dL — AB
SPECIFIC GRAVITY, URINE: 1.019 (ref 1.005–1.030)
pH: 5 (ref 5.0–8.0)

## 2016-08-16 LAB — BASIC METABOLIC PANEL
Anion gap: 8 (ref 5–15)
BUN: 28 mg/dL — ABNORMAL HIGH (ref 6–20)
CHLORIDE: 107 mmol/L (ref 101–111)
CO2: 26 mmol/L (ref 22–32)
CREATININE: 1.15 mg/dL — AB (ref 0.44–1.00)
Calcium: 9.2 mg/dL (ref 8.9–10.3)
GFR calc non Af Amer: 45 mL/min — ABNORMAL LOW (ref >60)
GFR, EST AFRICAN AMERICAN: 52 mL/min — AB (ref >60)
Glucose, Bld: 118 mg/dL — ABNORMAL HIGH (ref 65–99)
Potassium: 4 mmol/L (ref 3.5–5.1)
Sodium: 141 mmol/L (ref 135–145)

## 2016-08-16 MED ORDER — SODIUM CHLORIDE 0.9 % IV BOLUS (SEPSIS)
1000.0000 mL | Freq: Once | INTRAVENOUS | Status: AC
  Administered 2016-08-16: 1000 mL via INTRAVENOUS

## 2016-08-16 MED ORDER — CIPROFLOXACIN HCL 250 MG PO TABS
500.0000 mg | ORAL_TABLET | Freq: Once | ORAL | Status: AC
  Administered 2016-08-16: 500 mg via ORAL
  Filled 2016-08-16: qty 2

## 2016-08-16 MED ORDER — CIPROFLOXACIN HCL 500 MG PO TABS: 500.0000 mg | ORAL_TABLET | Freq: Two times a day (BID) | ORAL | 0 refills | Status: DC

## 2016-08-16 MED ORDER — ONDANSETRON 4 MG PO TBDP: ORAL_TABLET | ORAL | 0 refills | Status: DC

## 2016-08-16 NOTE — Discharge Instructions (Signed)
Drink plenty of fluids.  Follow up with Dr. Gala Romney the stomach specialist this week.

## 2016-08-16 NOTE — ED Provider Notes (Signed)
[facility] DEPT Provider Note   CSN: [id number] Arrival date & time: 08/16/16  1504     History   Chief Complaint Chief Complaint  Patient presents with  . Diarrhea    HPI Tamara Stein is a 76 y.o. female.  Patient complains of some diarrhea and vomiting. Abdominal cramping. Patient had colitis recently in the hospital but they suspected noninfectious colitis.   The history is provided by the patient.  Diarrhea   This is a recurrent problem. The current episode started 6 to 12 hours ago. The problem occurs 2 to 4 times per day. The problem has not changed since onset.The stool consistency is described as watery. There has been no fever. Pertinent negatives include no abdominal pain, no chills, no headaches and no cough.    Past Medical History:  Diagnosis Date  . Allergy   . Anxiety   . Cataract   . Cerebellar degeneration   . Chronic kidney disease   . Chronic knee pain   . Depression   . GERD (gastroesophageal reflux disease)   . Hyperlipidemia   . Hypertension   . Thyroid disease     Patient Active Problem List   Diagnosis Date Noted  . UTI (urinary tract infection) 07/15/2016  . Abdominal pain 07/15/2016  . Colitis 07/15/2016  . Syncope 04/26/2016  . Lower urinary tract infectious disease 04/26/2016  . Fall   . Pain of both shoulder joints 02/29/2016  . Arthritis of knee 09/24/2015  . Constipation 06/05/2015  . Depression 06/05/2015  . Hypothyroidism 06/05/2015  . GERD (gastroesophageal reflux disease)   . Hypertension   . Hyperlipidemia   . Cerebellar degeneration     Past Surgical History:  Procedure Laterality Date  . ABDOMINAL HYSTERECTOMY    . CHOLECYSTECTOMY    . RIGHT ELBOW      OB History    No data available       Home Medications    Prior to Admission medications   Medication Sig Start Date End Date Taking? Authorizing Provider  amLODipine (NORVASC) 10 MG tablet Take 1 tablet (10 mg total) by mouth daily. For blood  pressure 07/01/16   Eustaquio Maize, MD  aspirin EC 81 MG tablet Take 81 mg by mouth every morning.     Historical Provider, MD  atorvastatin (LIPITOR) 40 MG tablet Take 1 tablet by mouth at  bedtime 01/03/16   Claretta Fraise, MD  Calcium Carbonate-Vitamin D (CALTRATE 600+D PO) Take 1 tablet by mouth daily.    Historical Provider, MD  diphenoxylate-atropine (LOMOTIL) 2.5-0.025 MG tablet Take 1 tablet by mouth 4 (four) times daily as needed for diarrhea or loose stools. 07/20/16   Clanford Marisa Hua, MD  DULoxetine (CYMBALTA) 60 MG capsule TAKE 1 CAPSULE BY MOUTH  DAILY Patient taking differently: TAKE 37m CAPSULE BY MOUTH  DAILY 05/08/16   WClaretta Fraise MD  ferrous sulfate 325 (65 FE) MG tablet Take 325 mg by mouth daily with breakfast. Reported on 02/29/2016    Historical Provider, MD  fluticasone (FLONASE) 50 MCG/ACT nasal spray Place 2 sprays into both nostrils daily. Patient taking differently: Place 2 sprays into both nostrils daily as needed for allergies.  03/11/16   WClaretta Fraise MD  furosemide (LASIX) 20 MG tablet Take 1 tablet (20 mg total) by mouth daily. As needed for swelling 07/28/16   WClaretta Fraise MD  lactobacillus acidophilus & bulgar (LACTINEX) chewable tablet Chew 1 tablet by mouth 3 (three) times daily with meals. 07/20/16   Clanford  Marisa Hua, MD  levothyroxine (SYNTHROID, LEVOTHROID) 50 MCG tablet Take 1 tablet by mouth  daily before breakfast 01/03/16   Claretta Fraise, MD  lisinopril (PRINIVIL,ZESTRIL) 20 MG tablet Take 1 tablet (20 mg total) by mouth daily. 05/19/16   Eustaquio Maize, MD  lisinopril (PRINIVIL,ZESTRIL) 30 MG tablet Take 1 tablet (30 mg total) by mouth daily. 08/15/16   Eustaquio Maize, MD  meclizine (ANTIVERT) 25 MG tablet Take 1 tablet (25 mg total) by mouth 3 (three) times daily as needed for dizziness. 08/24/15   Claretta Fraise, MD  Multiple Vitamin (MULTIVITAMIN WITH MINERALS) TABS tablet Take 1 tablet by mouth daily.    Historical Provider, MD  omeprazole  (PRILOSEC) 20 MG capsule Take 1 capsule (20 mg total) by mouth daily. 05/19/16   Eustaquio Maize, MD  ondansetron (ZOFRAN ODT) 4 MG disintegrating tablet 22m ODT q4 hours prn nausea/vomit 08/16/16   JMilton Ferguson MD  raloxifene (EVISTA) 60 MG tablet TAKE 1 TABLET BY MOUTH  DAILY Patient taking differently: TAKE 672mTABLET BY MOUTH  DAILY 06/03/16   WaClaretta FraiseMD  traMADol (ULTRAM) 50 MG tablet Take 1 tablet (50 mg total) by mouth 4 (four) times daily as needed for moderate pain. 07/19/16   Clanford L Marisa HuaMD  zolpidem (AMBIEN) 5 MG tablet Take 1 tablet (5 mg total) by mouth at bedtime as needed for sleep. 07/20/16   Clanford L Marisa HuaMD    Family History Family History  Problem Relation Age of Onset  . Arthritis Sister   . Hyperlipidemia Sister   . Hypertension Sister   . Cancer Brother   . Diabetes Brother   . Heart disease Brother     Social History Social History  Substance Use Topics  . Smoking status: Never Smoker  . Smokeless tobacco: Never Used  . Alcohol use No     Allergies   Sulfa antibiotics; Codeine; Erythromycin; Ibuprofen; and Penicillins   Review of Systems Review of Systems  Constitutional: Negative for appetite change, chills and fatigue.  HENT: Negative for congestion, ear discharge and sinus pressure.   Eyes: Negative for discharge.  Respiratory: Negative for cough.   Cardiovascular: Negative for chest pain.  Gastrointestinal: Positive for diarrhea and nausea. Negative for abdominal pain.  Genitourinary: Negative for frequency and hematuria.  Musculoskeletal: Negative for back pain.  Skin: Negative for rash.  Neurological: Negative for seizures and headaches.  Psychiatric/Behavioral: Negative for hallucinations.     Physical Exam Updated Vital Signs BP 144/81   Pulse 87   Temp 97.7 F (36.5 C) (Oral)   Resp 20   Ht 5' 2"  (1.575 m)   Wt 202 lb (91.6 kg)   SpO2 100%   BMI 36.95 kg/m   Physical Exam  Constitutional: She is oriented  to person, place, and time. She appears well-developed.  HENT:  Head: Normocephalic.  Eyes: Conjunctivae and EOM are normal. No scleral icterus.  Neck: Neck supple. No thyromegaly present.  Cardiovascular: Normal rate and regular rhythm.  Exam reveals no gallop and no friction rub.   No murmur heard. Pulmonary/Chest: No stridor. She has no wheezes. She has no rales. She exhibits no tenderness.  Abdominal: She exhibits no distension. There is no tenderness. There is no rebound.  Musculoskeletal: Normal range of motion. She exhibits no edema.  Lymphadenopathy:    She has no cervical adenopathy.  Neurological: She is oriented to person, place, and time. She exhibits normal muscle tone. Coordination normal.  Skin: No rash noted.  No erythema.  Psychiatric: She has a normal mood and affect. Her behavior is normal.     ED Treatments / Results  Labs (all labs ordered are listed, but only abnormal results are displayed) Labs Reviewed  CBC WITH DIFFERENTIAL/PLATELET - Abnormal; Notable for the following:       Result Value   WBC 13.1 (*)    RBC 3.71 (*)    Hemoglobin 11.3 (*)    HCT 35.2 (*)    RDW 16.1 (*)    Neutro Abs 10.7 (*)    Monocytes Absolute 1.1 (*)    All other components within normal limits  BASIC METABOLIC PANEL - Abnormal; Notable for the following:    Glucose, Bld 118 (*)    BUN 28 (*)    Creatinine, Ser 1.15 (*)    GFR calc non Af Amer 45 (*)    GFR calc Af Amer 52 (*)    All other components within normal limits  URINALYSIS, ROUTINE W REFLEX MICROSCOPIC - Abnormal; Notable for the following:    Color, Urine AMBER (*)    APPearance CLOUDY (*)    Ketones, ur 5 (*)    Protein, ur 100 (*)    Leukocytes, UA SMALL (*)    Bacteria, UA RARE (*)    All other components within normal limits  HEPATIC FUNCTION PANEL - Abnormal; Notable for the following:    Indirect Bilirubin 0.2 (*)    All other components within normal limits    EKG  EKG Interpretation None        Radiology Dg Abd Acute W/chest  Result Date: 08/16/2016 CLINICAL DATA:  76 year old female with history of diarrhea. EXAM: DG ABDOMEN ACUTE W/ 1V CHEST COMPARISON:  Chest x-ray 04/17/2016. FINDINGS: Lung volumes are normal. No consolidative airspace disease. No pleural effusions. No pneumothorax. No pulmonary nodule or mass noted. Large hiatal hernia. Pulmonary vasculature and the cardiomediastinal silhouette are otherwise within normal limits. Aortic atherosclerosis. Mild elevation of left hemidiaphragm. Gas and stool are seen scattered throughout the colon extending to the level of the distal rectum. No pathologic distension of small bowel is noted. No gross evidence of pneumoperitoneum. IMPRESSION: 1.  Nonobstructive bowel gas pattern. 2. No pneumoperitoneum. 3. No radiographic evidence of acute cardiopulmonary disease. 4. Aortic atherosclerosis. Electronically Signed   By: Vinnie Langton M.D.   On: 08/16/2016 16:37    Procedures Procedures (including critical care time)  Medications Ordered in ED Medications  sodium chloride 0.9 % bolus 1,000 mL (1,000 mLs Intravenous New Bag/Given 08/16/16 1551)     Initial Impression / Assessment and Plan / ED Course  I have reviewed the triage vital signs and the nursing notes.  Pertinent labs & imaging results that were available during my care of the patient were reviewed by me and considered in my medical decision making (see chart for details).  Clinical Course     Patient had one bowel movement here which was performed. Patient felt better with IV fluids. Patient has urine that is suggesting UTI. We were unable to do a C. difficile study because the stool was formed. Patient will be sent home with Cipro to cover for UTI and has been referred to GI for follow-up on the colitis she had in the hospital  Final Clinical Impressions(s) / ED Diagnoses   Final diagnoses:  Nausea vomiting and diarrhea    New Prescriptions New Prescriptions    ONDANSETRON (ZOFRAN ODT) 4 MG DISINTEGRATING TABLET    18m ODT q4 hours  prn nausea/vomit     Milton Ferguson, MD 08/16/16 1921

## 2016-08-16 NOTE — ED Notes (Signed)
Lab called to inform nurse that they are unable to test stool for c.diff due to it being a formed sample.  MD notified.

## 2016-08-16 NOTE — ED Notes (Signed)
Pt waiting on ambulance transport back home.

## 2016-08-16 NOTE — ED Triage Notes (Addendum)
EMs reports pt was admitted at [facility] for an intestinal infection and diarrhea.  Reports symptoms returned today.  Denies any pain or vomiting but reports nausea.   Pt incontinent of diarrhea.  EMS reports pt denied sob but room air o2 sat was 86%.  Sat increased to 98% on 3L.  BP 162/96 and HR 86.  Pt alert and oriented.

## 2016-08-19 DIAGNOSIS — K5150 Left sided colitis without complications: Secondary | ICD-10-CM | POA: Diagnosis not present

## 2016-08-19 DIAGNOSIS — D500 Iron deficiency anemia secondary to blood loss (chronic): Secondary | ICD-10-CM | POA: Diagnosis not present

## 2016-08-19 DIAGNOSIS — R4701 Aphasia: Secondary | ICD-10-CM | POA: Diagnosis not present

## 2016-08-19 DIAGNOSIS — I129 Hypertensive chronic kidney disease with stage 1 through stage 4 chronic kidney disease, or unspecified chronic kidney disease: Secondary | ICD-10-CM | POA: Diagnosis not present

## 2016-08-19 DIAGNOSIS — F329 Major depressive disorder, single episode, unspecified: Secondary | ICD-10-CM | POA: Diagnosis not present

## 2016-08-19 DIAGNOSIS — G319 Degenerative disease of nervous system, unspecified: Secondary | ICD-10-CM | POA: Diagnosis not present

## 2016-08-19 LAB — URINE CULTURE

## 2016-08-20 DIAGNOSIS — K5150 Left sided colitis without complications: Secondary | ICD-10-CM | POA: Diagnosis not present

## 2016-08-20 DIAGNOSIS — D500 Iron deficiency anemia secondary to blood loss (chronic): Secondary | ICD-10-CM | POA: Diagnosis not present

## 2016-08-20 DIAGNOSIS — F329 Major depressive disorder, single episode, unspecified: Secondary | ICD-10-CM | POA: Diagnosis not present

## 2016-08-20 DIAGNOSIS — G319 Degenerative disease of nervous system, unspecified: Secondary | ICD-10-CM | POA: Diagnosis not present

## 2016-08-20 DIAGNOSIS — R4701 Aphasia: Secondary | ICD-10-CM | POA: Diagnosis not present

## 2016-08-20 DIAGNOSIS — I129 Hypertensive chronic kidney disease with stage 1 through stage 4 chronic kidney disease, or unspecified chronic kidney disease: Secondary | ICD-10-CM | POA: Diagnosis not present

## 2016-08-26 ENCOUNTER — Encounter: Payer: Self-pay | Admitting: Pediatrics

## 2016-08-26 ENCOUNTER — Ambulatory Visit (INDEPENDENT_AMBULATORY_CARE_PROVIDER_SITE_OTHER): Payer: Medicare Other | Admitting: Pediatrics

## 2016-08-26 VITALS — BP 123/77 | HR 89 | Temp 96.8°F | Ht 62.0 in

## 2016-08-26 DIAGNOSIS — G319 Degenerative disease of nervous system, unspecified: Secondary | ICD-10-CM

## 2016-08-26 DIAGNOSIS — D500 Iron deficiency anemia secondary to blood loss (chronic): Secondary | ICD-10-CM | POA: Diagnosis not present

## 2016-08-26 DIAGNOSIS — F329 Major depressive disorder, single episode, unspecified: Secondary | ICD-10-CM | POA: Diagnosis not present

## 2016-08-26 DIAGNOSIS — I129 Hypertensive chronic kidney disease with stage 1 through stage 4 chronic kidney disease, or unspecified chronic kidney disease: Secondary | ICD-10-CM | POA: Diagnosis not present

## 2016-08-26 DIAGNOSIS — H547 Unspecified visual loss: Secondary | ICD-10-CM | POA: Diagnosis not present

## 2016-08-26 DIAGNOSIS — K5900 Constipation, unspecified: Secondary | ICD-10-CM | POA: Diagnosis not present

## 2016-08-26 DIAGNOSIS — I10 Essential (primary) hypertension: Secondary | ICD-10-CM | POA: Diagnosis not present

## 2016-08-26 DIAGNOSIS — R4701 Aphasia: Secondary | ICD-10-CM | POA: Diagnosis not present

## 2016-08-26 DIAGNOSIS — K5150 Left sided colitis without complications: Secondary | ICD-10-CM | POA: Diagnosis not present

## 2016-08-26 MED ORDER — DOCUSATE SODIUM 100 MG PO CAPS: 100.0000 mg | ORAL_CAPSULE | Freq: Two times a day (BID) | ORAL | 0 refills | Status: DC

## 2016-08-26 NOTE — Progress Notes (Signed)
  Subjective:   Patient ID: Tamara Stein, female    DOB: 03-14-1941, 76 y.o.   MRN: [id number] CC: Hospitalization Follow-up (Colitis)  HPI: Tamara Stein is a 76 y.o. female presenting for Hospitalization Follow-up (Colitis)  Seen in ED 10 days ago for nausea and votming Has since resolved Recently treated for colitis before that Small bowel movement yesterday, but otherwise no stooling Has been about a week since normal passing of stool Feels constipated Done with antibiotics Was taking miralax before colitis/diarrhea episode Has not restarted yet  Poor appetite for the last week Had 5 teeth removed 5 days ago Took two tramadol for the tooth removal Not able to chew normally  No belly pain past two weeks Passing gas No fevers  Relevant past medical, surgical, family and social history reviewed. Allergies and medications reviewed and updated. History  Smoking Status  . Never Smoker  Smokeless Tobacco  . Never Used   ROS: Per HPI   Objective:    BP 123/77   Pulse 89   Temp (!) 96.8 F (36 C)   Ht 5' 2"  (1.575 m)   Wt Readings from Last 3 Encounters:  08/16/16 202 lb (91.6 kg)  07/01/16 202 lb (91.6 kg)  05/19/16 212 lb (96.2 kg)    Gen: NAD, alert, cooperative with exam, NCAT, in wheel chair EYES: EOMI, no conjunctival injection, or no icterus CV: NRRR, normal S1/S2, distal pulses 2+ b/l Resp: CTABL, no wheezes, normal WOB Abd: +BS, soft, NTND. no guarding or organomegaly Ext: No edema, warm Neuro: Alert and oriented, dyarthria unchanged  Assessment & Plan:  Raneen was seen today for hospitalization follow-up.  Diagnoses and all orders for this visit:  Vision problem Decreased vision over past few months pt thinks No eye pain -     Ambulatory referral to Ophthalmology  Constipation, unspecified constipation type restart miralax TID, increase fluid intake -     docusate sodium (COLACE) 100 MG capsule; Take 1 capsule (100 mg total) by mouth 2  (two) times daily.  Essential hypertension Well controlled now, cont meds  Cerebellar degeneration Affecting vision, sometimes having double vision Still able to do ADLs, has not had eye exam in a while  Follow up plan: Return in about 3 months (around 11/24/2016). Assunta Found, MD [place]

## 2016-08-26 NOTE — Patient Instructions (Signed)
Increase daily fluid intake miralax at least three times a day Can try colace twice a day for stool softener If no stool in 24 hours, try senna or bisacodyl   I put in referral for eye doctor

## 2016-08-27 ENCOUNTER — Other Ambulatory Visit: Payer: Self-pay | Admitting: Family Medicine

## 2016-09-02 DIAGNOSIS — I129 Hypertensive chronic kidney disease with stage 1 through stage 4 chronic kidney disease, or unspecified chronic kidney disease: Secondary | ICD-10-CM | POA: Diagnosis not present

## 2016-09-02 DIAGNOSIS — D500 Iron deficiency anemia secondary to blood loss (chronic): Secondary | ICD-10-CM | POA: Diagnosis not present

## 2016-09-02 DIAGNOSIS — F329 Major depressive disorder, single episode, unspecified: Secondary | ICD-10-CM | POA: Diagnosis not present

## 2016-09-02 DIAGNOSIS — R4701 Aphasia: Secondary | ICD-10-CM | POA: Diagnosis not present

## 2016-09-02 DIAGNOSIS — K5150 Left sided colitis without complications: Secondary | ICD-10-CM | POA: Diagnosis not present

## 2016-09-02 DIAGNOSIS — G319 Degenerative disease of nervous system, unspecified: Secondary | ICD-10-CM | POA: Diagnosis not present

## 2016-09-05 DIAGNOSIS — Z961 Presence of intraocular lens: Secondary | ICD-10-CM | POA: Diagnosis not present

## 2016-09-08 DIAGNOSIS — G319 Degenerative disease of nervous system, unspecified: Secondary | ICD-10-CM | POA: Diagnosis not present

## 2016-09-08 DIAGNOSIS — D500 Iron deficiency anemia secondary to blood loss (chronic): Secondary | ICD-10-CM | POA: Diagnosis not present

## 2016-09-08 DIAGNOSIS — R4701 Aphasia: Secondary | ICD-10-CM | POA: Diagnosis not present

## 2016-09-08 DIAGNOSIS — I129 Hypertensive chronic kidney disease with stage 1 through stage 4 chronic kidney disease, or unspecified chronic kidney disease: Secondary | ICD-10-CM | POA: Diagnosis not present

## 2016-09-08 DIAGNOSIS — K5150 Left sided colitis without complications: Secondary | ICD-10-CM | POA: Diagnosis not present

## 2016-09-08 DIAGNOSIS — F329 Major depressive disorder, single episode, unspecified: Secondary | ICD-10-CM | POA: Diagnosis not present

## 2016-09-11 DIAGNOSIS — F329 Major depressive disorder, single episode, unspecified: Secondary | ICD-10-CM | POA: Diagnosis not present

## 2016-09-11 DIAGNOSIS — I129 Hypertensive chronic kidney disease with stage 1 through stage 4 chronic kidney disease, or unspecified chronic kidney disease: Secondary | ICD-10-CM | POA: Diagnosis not present

## 2016-09-11 DIAGNOSIS — D500 Iron deficiency anemia secondary to blood loss (chronic): Secondary | ICD-10-CM | POA: Diagnosis not present

## 2016-09-11 DIAGNOSIS — K5150 Left sided colitis without complications: Secondary | ICD-10-CM | POA: Diagnosis not present

## 2016-09-11 DIAGNOSIS — G319 Degenerative disease of nervous system, unspecified: Secondary | ICD-10-CM | POA: Diagnosis not present

## 2016-09-11 DIAGNOSIS — R4701 Aphasia: Secondary | ICD-10-CM | POA: Diagnosis not present

## 2016-09-25 DIAGNOSIS — I129 Hypertensive chronic kidney disease with stage 1 through stage 4 chronic kidney disease, or unspecified chronic kidney disease: Secondary | ICD-10-CM | POA: Diagnosis not present

## 2016-09-25 DIAGNOSIS — G319 Degenerative disease of nervous system, unspecified: Secondary | ICD-10-CM | POA: Diagnosis not present

## 2016-09-25 DIAGNOSIS — D500 Iron deficiency anemia secondary to blood loss (chronic): Secondary | ICD-10-CM | POA: Diagnosis not present

## 2016-09-25 DIAGNOSIS — K5150 Left sided colitis without complications: Secondary | ICD-10-CM | POA: Diagnosis not present

## 2016-09-25 DIAGNOSIS — R4701 Aphasia: Secondary | ICD-10-CM | POA: Diagnosis not present

## 2016-09-25 DIAGNOSIS — F329 Major depressive disorder, single episode, unspecified: Secondary | ICD-10-CM | POA: Diagnosis not present

## 2016-10-16 ENCOUNTER — Telehealth: Payer: Self-pay | Admitting: Family Medicine

## 2016-10-16 ENCOUNTER — Other Ambulatory Visit (INDEPENDENT_AMBULATORY_CARE_PROVIDER_SITE_OTHER): Payer: Medicare Other

## 2016-10-16 DIAGNOSIS — Z87440 Personal history of urinary (tract) infections: Secondary | ICD-10-CM

## 2016-10-16 DIAGNOSIS — R350 Frequency of micturition: Secondary | ICD-10-CM | POA: Diagnosis not present

## 2016-10-16 DIAGNOSIS — R8290 Unspecified abnormal findings in urine: Secondary | ICD-10-CM | POA: Diagnosis not present

## 2016-10-16 LAB — URINALYSIS, ROUTINE W REFLEX MICROSCOPIC
Bilirubin, UA: NEGATIVE
GLUCOSE, UA: NEGATIVE
Ketones, UA: NEGATIVE
Nitrite, UA: POSITIVE — AB
PH UA: 5 (ref 5.0–7.5)
RBC, UA: NEGATIVE
Specific Gravity, UA: 1.02 (ref 1.005–1.030)
Urobilinogen, Ur: 0.2 mg/dL (ref 0.2–1.0)

## 2016-10-16 LAB — MICROSCOPIC EXAMINATION: Epithelial Cells (non renal): >10 /hpf — AB (ref 0–10)

## 2016-10-16 NOTE — Telephone Encounter (Signed)
What symptoms do you have?urine has odor  How long have you been sick? 2-3 days  Have you been seen for this problem? No, wants to know if she can bring in urine specimen  If your provider decides to give you a prescription, which pharmacy would you like for it to be sent to? [facility] [place]   Patient informed that this information will be sent to the clinical staff for review and that they should receive a follow up call.

## 2016-10-16 NOTE — Telephone Encounter (Signed)
Pt would like to bring in urine for UA and culture Order entered in Epic

## 2016-10-17 ENCOUNTER — Other Ambulatory Visit: Payer: Self-pay | Admitting: Family Medicine

## 2016-10-17 ENCOUNTER — Other Ambulatory Visit: Payer: Self-pay

## 2016-10-17 ENCOUNTER — Telehealth: Payer: Self-pay

## 2016-10-17 MED ORDER — NITROFURANTOIN MONOHYD MACRO 100 MG PO CAPS: 100.0000 mg | ORAL_CAPSULE | Freq: Two times a day (BID) | ORAL | 0 refills | Status: DC

## 2016-10-17 NOTE — Telephone Encounter (Signed)
Patient left a urine yesterday and is calling for results.  Her aide leaves at 12:00 pm today and she is wanting to get medication if needed before then, will not have anyway to pick up medication over the weekend.

## 2016-10-20 LAB — URINE CULTURE

## 2016-11-12 ENCOUNTER — Encounter: Payer: Self-pay | Admitting: Pediatrics

## 2016-11-12 ENCOUNTER — Ambulatory Visit (INDEPENDENT_AMBULATORY_CARE_PROVIDER_SITE_OTHER): Payer: Medicare Other | Admitting: Pediatrics

## 2016-11-12 VITALS — BP 133/84 | HR 76 | Temp 97.0°F | Ht 62.0 in

## 2016-11-12 DIAGNOSIS — F439 Reaction to severe stress, unspecified: Secondary | ICD-10-CM

## 2016-11-12 DIAGNOSIS — R51 Headache: Secondary | ICD-10-CM

## 2016-11-12 DIAGNOSIS — I10 Essential (primary) hypertension: Secondary | ICD-10-CM | POA: Diagnosis not present

## 2016-11-12 DIAGNOSIS — R519 Headache, unspecified: Secondary | ICD-10-CM

## 2016-11-12 DIAGNOSIS — R399 Unspecified symptoms and signs involving the genitourinary system: Secondary | ICD-10-CM

## 2016-11-12 LAB — URINALYSIS, COMPLETE
Bilirubin, UA: NEGATIVE
GLUCOSE, UA: NEGATIVE
KETONES UA: NEGATIVE
Leukocytes, UA: NEGATIVE
NITRITE UA: NEGATIVE
RBC UA: NEGATIVE
SPEC GRAV UA: 1.015 (ref 1.005–1.030)
UUROB: 0.2 mg/dL (ref 0.2–1.0)
pH, UA: 5 (ref 5.0–7.5)

## 2016-11-12 LAB — MICROSCOPIC EXAMINATION: Renal Epithel, UA: NONE SEEN /hpf

## 2016-11-12 NOTE — Progress Notes (Signed)
  Subjective:   Patient ID: Tamara Stein, female    DOB: 09-16-1940, 76 y.o.   MRN: [id number] CC: Dizziness and Fatigue  HPI: Tamara Stein is a 76 y.o. female presenting for Dizziness and Fatigue  Drinking water or diet lemonade 1-2 glasses in a day, sometimes one coke 1 cup of coffee sometimes No other fluids No fever Urine has been strong smelling for a few days Is constipated  HTN: Taking meds regularly Feeling headache/dizzy at home sometimes Takes tylenol about 2 times a week for headaches Rarely has to take tramadol for headache, not every week Previously getting from outside office Is not sure where in her head it hurts when it hurts  Sister with cancer, now in a lot of pain Pt has been stressed because of that  Relevant past medical, surgical, family and social history reviewed. Allergies and medications reviewed and updated. History  Smoking Status  . Never Smoker  Smokeless Tobacco  . Never Used   ROS: Per HPI   Objective:    BP 133/84   Pulse 76   Temp 97 F (36.1 C) (Oral)   Ht 5' 2"  (1.575 m)   Wt Readings from Last 3 Encounters:  08/16/16 202 lb (91.6 kg)  07/01/16 202 lb (91.6 kg)  05/19/16 212 lb (96.2 kg)    Gen: NAD, alert, cooperative with exam, NCAT EYES: EOMI, no conjunctival injection, or no icterus ENT:  OP without erythema, dry lips LYMPH: no cervical LAD CV: NRRR, normal S1/S2, distal pulses 2+ b/l Resp: CTABL, no wheezes, normal WOB Abd: +BS, soft, NTND. No cva tenderness Ext: No pitting edema, warm Neuro: Alert and oriented, speech slightly dysarthric per baseline, in wheelchair Psych: normal affect  Assessment & Plan:  Tamara Stein was seen today for dizziness and fatigue.  Diagnoses and all orders for this visit:  UTI symptoms No symptoms, strong smelling urine, minimal water intake, urinating apprx once a day Increase water intake UA with small amount protein, repeat next visit f/u culture -     Urinalysis,  Complete -     Urine culture  Essential hypertension Initially elevated, improved with recheck Follow at home Cont current meds  Stress at home Sister with stage 4 cancer Pt upset that she is in pain, has follow up with her oncologist upcoming Discussed stress relief, talking with sister, pt has been trying hard to hide all feelings from sister  Headaches/lightheadedness Lots of stress recently Not able to describe headache pain location, says more like a funny feeling Will have pt check BPs at home regularly and with feeling  Follow up plan: Return in about 4 weeks (around 12/10/2016). Assunta Found, MD [place]

## 2016-11-12 NOTE — Patient Instructions (Signed)
Check BP at home in the mornings and when you get the headache/lightheaded feeling

## 2016-11-14 LAB — URINE CULTURE

## 2016-12-04 ENCOUNTER — Other Ambulatory Visit: Payer: Self-pay | Admitting: Family Medicine

## 2016-12-04 NOTE — Telephone Encounter (Signed)
I do not see Diclofenac on med list. Please review

## 2016-12-09 ENCOUNTER — Other Ambulatory Visit: Payer: Self-pay | Admitting: *Deleted

## 2016-12-09 MED ORDER — DULOXETINE HCL 60 MG PO CPEP: 60.0000 mg | ORAL_CAPSULE | Freq: Every day | ORAL | 0 refills | Status: DC

## 2016-12-09 MED ORDER — ATORVASTATIN CALCIUM 40 MG PO TABS: 40.0000 mg | ORAL_TABLET | Freq: Every day | ORAL | 0 refills | Status: DC

## 2016-12-09 MED ORDER — OMEPRAZOLE 40 MG PO CPDR: 40.0000 mg | DELAYED_RELEASE_CAPSULE | Freq: Every day | ORAL | 0 refills | Status: DC

## 2016-12-09 MED ORDER — RALOXIFENE HCL 60 MG PO TABS: 60.0000 mg | ORAL_TABLET | Freq: Every day | ORAL | 0 refills | Status: DC

## 2016-12-29 ENCOUNTER — Other Ambulatory Visit: Payer: Medicare Other

## 2016-12-29 ENCOUNTER — Ambulatory Visit (INDEPENDENT_AMBULATORY_CARE_PROVIDER_SITE_OTHER): Payer: Medicare Other

## 2016-12-29 ENCOUNTER — Other Ambulatory Visit (INDEPENDENT_AMBULATORY_CARE_PROVIDER_SITE_OTHER): Payer: Medicare Other

## 2016-12-29 ENCOUNTER — Telehealth: Payer: Self-pay | Admitting: Pediatrics

## 2016-12-29 ENCOUNTER — Other Ambulatory Visit: Payer: Self-pay | Admitting: Orthopedic Surgery

## 2016-12-29 ENCOUNTER — Ambulatory Visit (INDEPENDENT_AMBULATORY_CARE_PROVIDER_SITE_OTHER): Payer: Medicare Other | Admitting: Pediatrics

## 2016-12-29 ENCOUNTER — Encounter: Payer: Self-pay | Admitting: Pediatrics

## 2016-12-29 VITALS — BP 119/67 | HR 86 | Temp 97.2°F | Ht 62.0 in

## 2016-12-29 DIAGNOSIS — R399 Unspecified symptoms and signs involving the genitourinary system: Secondary | ICD-10-CM | POA: Diagnosis not present

## 2016-12-29 DIAGNOSIS — R52 Pain, unspecified: Secondary | ICD-10-CM

## 2016-12-29 DIAGNOSIS — M1990 Unspecified osteoarthritis, unspecified site: Secondary | ICD-10-CM

## 2016-12-29 DIAGNOSIS — M25562 Pain in left knee: Secondary | ICD-10-CM

## 2016-12-29 LAB — URINALYSIS, COMPLETE
Bilirubin, UA: NEGATIVE
Glucose, UA: NEGATIVE
Nitrite, UA: POSITIVE — AB
SPEC GRAV UA: 1.025 (ref 1.005–1.030)
Urobilinogen, Ur: 0.2 mg/dL (ref 0.2–1.0)
pH, UA: 5 (ref 5.0–7.5)

## 2016-12-29 LAB — MICROSCOPIC EXAMINATION: Renal Epithel, UA: NONE SEEN /hpf

## 2016-12-29 MED ORDER — DICLOFENAC SODIUM 50 MG PO TBEC: 50.0000 mg | DELAYED_RELEASE_TABLET | Freq: Three times a day (TID) | ORAL | 1 refills | Status: DC

## 2016-12-29 NOTE — Telephone Encounter (Signed)
Returned patient's phone call and [name] states that patient has a foul urine odor and some confusion today and would like to leave a urine sample when in for Xray

## 2016-12-29 NOTE — Telephone Encounter (Signed)
Called Tamara Stein to let her know that Dr. Evette Doffing would like to see patient while she is at the office having Xray done.  Patient states that she does not want to be seen.

## 2016-12-29 NOTE — Progress Notes (Signed)
  Subjective:   Patient ID: Tamara Stein, female    DOB: 1941/02/14, 76 y.o.   MRN: [id number] CC: Foul Urine Odor  HPI: Tamara Stein is a 76 y.o. female presenting for Foul Urine Odor  Started a few days ago Has not been drinking much fluid Daughter recently returned home, says pt's urine has been much stronger smelling, darker  Appetite has been ok No abd pain  Pt without dysuria, fevers Says she does not get those symptoms usually with UTIs  Sister has been sick with cancer, pt has been very worried about her Thinking has been clear, has not been confused  Bothered by arthritis in back, shoulders Takes diclofenac daily, helps  Relevant past medical, surgical, family and social history reviewed. Allergies and medications reviewed and updated. History  Smoking Status  . Never Smoker  Smokeless Tobacco  . Never Used   ROS: Per HPI   Objective:    BP 119/67   Pulse 86   Temp 97.2 F (36.2 C) (Oral)   Ht 5' 2"  (1.575 m)   Wt Readings from Last 3 Encounters:  08/16/16 202 lb (91.6 kg)  07/01/16 202 lb (91.6 kg)  05/19/16 212 lb (96.2 kg)    Gen: NAD, alert, cooperative with exam, NCAT, in wheelchair EYES: EOMI, no conjunctival injection, or no icterus CV: NRRR, normal S1/S2, no murmur, distal pulses 2+ b/l Resp: CTABL, no wheezes, normal WOB Abd: +BS, soft, NTND. Ext: No edema, warm Neuro: Alert and oriented MSK: normal muscle bulk  Assessment & Plan:  Tamara Stein was seen today for foul urine odor and altered mental status.  Diagnoses and all orders for this visit:  UTI symptoms Dark urine, minimal fluid intake bc she avoids using bathroom, no dysuria, abd pain or fevers Will send for culture before treating, UA slightly positive -     Urinalysis, Complete -     Urine culture  Arthritis Stable, cont below -     diclofenac (VOLTAREN) 50 MG EC tablet; Take 1 tablet (50 mg total) by mouth 3 (three) times daily.  Other orders -     Microscopic  Examination   Follow up plan: 2 mo, sooner if needed Assunta Found, MD [place]

## 2017-01-01 ENCOUNTER — Other Ambulatory Visit: Payer: Self-pay | Admitting: Pediatrics

## 2017-01-01 DIAGNOSIS — M1711 Unilateral primary osteoarthritis, right knee: Secondary | ICD-10-CM | POA: Diagnosis not present

## 2017-01-01 DIAGNOSIS — G8929 Other chronic pain: Secondary | ICD-10-CM | POA: Diagnosis not present

## 2017-01-01 DIAGNOSIS — M25561 Pain in right knee: Secondary | ICD-10-CM | POA: Diagnosis not present

## 2017-01-01 DIAGNOSIS — N3090 Cystitis, unspecified without hematuria: Secondary | ICD-10-CM

## 2017-01-01 DIAGNOSIS — M1712 Unilateral primary osteoarthritis, left knee: Secondary | ICD-10-CM | POA: Diagnosis not present

## 2017-01-01 DIAGNOSIS — M25562 Pain in left knee: Secondary | ICD-10-CM | POA: Diagnosis not present

## 2017-01-01 MED ORDER — NITROFURANTOIN MONOHYD MACRO 100 MG PO CAPS: 100.0000 mg | ORAL_CAPSULE | Freq: Two times a day (BID) | ORAL | 0 refills | Status: AC

## 2017-01-01 NOTE — Progress Notes (Signed)
Treat cystitis with nitrofurnatoin. Will f/u final culture

## 2017-01-03 LAB — URINE CULTURE

## 2017-01-07 ENCOUNTER — Other Ambulatory Visit: Payer: Self-pay | Admitting: Pediatrics

## 2017-01-07 MED ORDER — CIPROFLOXACIN HCL 250 MG PO TABS: 250.0000 mg | ORAL_TABLET | Freq: Two times a day (BID) | ORAL | 0 refills | Status: DC

## 2017-01-28 ENCOUNTER — Telehealth: Payer: Self-pay | Admitting: Family Medicine

## 2017-01-28 NOTE — Telephone Encounter (Signed)
Patient aware that usually Home Health does not come in to home to just give a bath.  Will send message to referral department.

## 2017-01-29 NOTE — Telephone Encounter (Signed)
Sorry, due to her insurance it would be private pay only. If she chooses this, then [facility] in [place] is who we would use

## 2017-02-06 ENCOUNTER — Telehealth: Payer: Self-pay | Admitting: Pediatrics

## 2017-02-06 NOTE — Telephone Encounter (Signed)
Pt with h/o cerebellar degeneration Mobility is very difficult Requested letter from physican to excuse her from jury duty, letter written. Given to [employer] who is on her release of information.

## 2017-02-25 ENCOUNTER — Other Ambulatory Visit: Payer: Self-pay | Admitting: Family Medicine

## 2017-02-25 ENCOUNTER — Other Ambulatory Visit: Payer: Self-pay | Admitting: Pediatrics

## 2017-03-05 DIAGNOSIS — M170 Bilateral primary osteoarthritis of knee: Secondary | ICD-10-CM | POA: Diagnosis not present

## 2017-03-05 DIAGNOSIS — M1712 Unilateral primary osteoarthritis, left knee: Secondary | ICD-10-CM | POA: Diagnosis not present

## 2017-03-05 DIAGNOSIS — G8929 Other chronic pain: Secondary | ICD-10-CM | POA: Diagnosis not present

## 2017-03-05 DIAGNOSIS — M1711 Unilateral primary osteoarthritis, right knee: Secondary | ICD-10-CM | POA: Diagnosis not present

## 2017-03-05 DIAGNOSIS — M25561 Pain in right knee: Secondary | ICD-10-CM | POA: Diagnosis not present

## 2017-04-01 DIAGNOSIS — H7292 Unspecified perforation of tympanic membrane, left ear: Secondary | ICD-10-CM | POA: Diagnosis not present

## 2017-04-01 DIAGNOSIS — H6122 Impacted cerumen, left ear: Secondary | ICD-10-CM | POA: Diagnosis not present

## 2017-04-15 DIAGNOSIS — Z1231 Encounter for screening mammogram for malignant neoplasm of breast: Secondary | ICD-10-CM | POA: Diagnosis not present

## 2017-04-20 ENCOUNTER — Ambulatory Visit (INDEPENDENT_AMBULATORY_CARE_PROVIDER_SITE_OTHER): Payer: Medicare Other | Admitting: Pediatrics

## 2017-04-20 ENCOUNTER — Encounter: Payer: Medicare Other | Admitting: Pediatrics

## 2017-04-20 VITALS — BP 120/80 | HR 90 | Temp 97.6°F | Ht 62.0 in

## 2017-04-20 DIAGNOSIS — K5900 Constipation, unspecified: Secondary | ICD-10-CM | POA: Diagnosis not present

## 2017-04-20 DIAGNOSIS — E785 Hyperlipidemia, unspecified: Secondary | ICD-10-CM

## 2017-04-20 DIAGNOSIS — R8290 Unspecified abnormal findings in urine: Secondary | ICD-10-CM

## 2017-04-20 DIAGNOSIS — I10 Essential (primary) hypertension: Secondary | ICD-10-CM | POA: Diagnosis not present

## 2017-04-20 DIAGNOSIS — D649 Anemia, unspecified: Secondary | ICD-10-CM | POA: Diagnosis not present

## 2017-04-20 DIAGNOSIS — E039 Hypothyroidism, unspecified: Secondary | ICD-10-CM | POA: Diagnosis not present

## 2017-04-20 DIAGNOSIS — K219 Gastro-esophageal reflux disease without esophagitis: Secondary | ICD-10-CM

## 2017-04-20 DIAGNOSIS — F419 Anxiety disorder, unspecified: Secondary | ICD-10-CM

## 2017-04-20 DIAGNOSIS — G9340 Encephalopathy, unspecified: Secondary | ICD-10-CM | POA: Insufficient documentation

## 2017-04-20 DIAGNOSIS — J3089 Other allergic rhinitis: Secondary | ICD-10-CM | POA: Diagnosis not present

## 2017-04-20 DIAGNOSIS — M1990 Unspecified osteoarthritis, unspecified site: Secondary | ICD-10-CM | POA: Diagnosis not present

## 2017-04-20 DIAGNOSIS — R32 Unspecified urinary incontinence: Secondary | ICD-10-CM | POA: Insufficient documentation

## 2017-04-20 LAB — URINALYSIS, COMPLETE
BILIRUBIN UA: NEGATIVE
GLUCOSE, UA: NEGATIVE
LEUKOCYTES UA: NEGATIVE
Nitrite, UA: NEGATIVE
RBC UA: NEGATIVE
Urobilinogen, Ur: 0.2 mg/dL (ref 0.2–1.0)
pH, UA: 5.5 (ref 5.0–7.5)

## 2017-04-20 LAB — MICROSCOPIC EXAMINATION
Epithelial Cells (non renal): >10 /hpf — AB (ref 0–10)
RBC, UA: NONE SEEN /hpf (ref 0–?)

## 2017-04-20 MED ORDER — OMEPRAZOLE 40 MG PO CPDR: 40.0000 mg | DELAYED_RELEASE_CAPSULE | Freq: Every day | ORAL | 1 refills | Status: DC

## 2017-04-20 MED ORDER — FLUTICASONE PROPIONATE 50 MCG/ACT NA SUSP: 2.0000 | Freq: Every day | NASAL | 5 refills | Status: DC | PRN

## 2017-04-20 MED ORDER — DOCUSATE SODIUM 100 MG PO CAPS: 100.0000 mg | ORAL_CAPSULE | Freq: Two times a day (BID) | ORAL | 0 refills | Status: DC

## 2017-04-20 MED ORDER — ATORVASTATIN CALCIUM 40 MG PO TABS: 40.0000 mg | ORAL_TABLET | Freq: Every day | ORAL | 1 refills | Status: DC

## 2017-04-20 MED ORDER — RALOXIFENE HCL 60 MG PO TABS: 60.0000 mg | ORAL_TABLET | Freq: Every day | ORAL | 1 refills | Status: DC

## 2017-04-20 MED ORDER — LEVOTHYROXINE SODIUM 50 MCG PO TABS: ORAL_TABLET | ORAL | 1 refills | Status: DC

## 2017-04-20 MED ORDER — DULOXETINE HCL 60 MG PO CPEP: 60.0000 mg | ORAL_CAPSULE | Freq: Every day | ORAL | 0 refills | Status: DC

## 2017-04-20 MED ORDER — DICLOFENAC SODIUM 50 MG PO TBEC: 50.0000 mg | DELAYED_RELEASE_TABLET | Freq: Three times a day (TID) | ORAL | 1 refills | Status: DC

## 2017-04-20 MED ORDER — LISINOPRIL 30 MG PO TABS: 30.0000 mg | ORAL_TABLET | Freq: Every day | ORAL | 1 refills | Status: DC

## 2017-04-20 NOTE — Progress Notes (Signed)
Subjective:   Patient ID: Tamara Stein, female    DOB: Jan 16, 1941, 76 y.o.   MRN: [id number] CC: f/u multiple med problems  HPI: Tamara Stein is a 76 y.o. female presenting for f/u multiple med problems  Stomach hurts off and on Not every day Sometimes at night Sometimes during the day With discomfort will have a sudden need to have bowel movement, loose when she goes Feels better after she passes stool Doesn't know if certain foods seem to make it worse Has happened twice in the past couple of weeks Has been ongoing for some months Had cholecystectomy in her 84s  Taking miralax for constipation, not every day Drinking minimal fluids during the day Has a sitter with her during the day Does have a method for using depends to empty bladder when she isnt able to get herself up to toilet when she is on her own  Having more dizziness off and on, ongoing for years Thinks getting worse past few months Has h/o cerebellar ataxia, now wheelchair bound Following with neurology  Relevant past medical, surgical, family and social history reviewed. Allergies and medications reviewed and updated. History  Smoking Status  . Never Smoker  Smokeless Tobacco  . Never Used   ROS: Per HPI   Objective:    BP 120/80   Pulse 90   Temp 97.6 F (36.4 C) (Oral)   Ht _0  (1.575 m)   Wt Readings from Last 3 Encounters:  08/16/16 202 lb (91.6 kg)  07/01/16 202 lb (91.6 kg)  05/19/16 212 lb (96.2 kg)    Gen: NAD, alert, cooperative with exam, NCAT EYES: EOMI, no conjunctival injection, or no icterus ENT:  TMs pearly gray b/l, OP without erythema LYMPH: no cervical LAD CV: NRRR, normal S1/S2, no murmur, distal pulses 2+ b/l Resp: CTABL, no wheezes, normal WOB Abd: +BS, soft, NTND.  Ext: No pitting edema, warm Neuro: Alert and oriented MSK: normal muscle bulk  Assessment & Plan:  Tamara Stein was seen today for follow up multiple med problems  Diagnoses and all orders for this  visit:  Abnormal urine odor UA without Leu, urine very concetrated Drinking minimal fluids Discussed need to increase fluid intake -     Urinalysis, Complete  Arthritis Taking below TID prn, stable -     diclofenac (VOLTAREN) 50 MG EC tablet; Take 1 tablet (50 mg total) by mouth 3 (three) times daily.  Constipation, unspecified constipation type Alternating constipation and loose stools Increase fiber, hydration Stop iron, cont stool softeners Avoid greasy foods -     docusate sodium (COLACE) 100 MG capsule; Take 1 capsule (100 mg total) by mouth 2 (two) times daily.  Gastroesophageal reflux disease without esophagitis Symptoms if she misses below, cont -     omeprazole (PRILOSEC) 40 MG capsule; Take 1 capsule (40 mg total) by mouth daily.  Essential hypertension with goal blood pressure less than 140/90 Stable, cont med -     lisinopril (PRINIVIL,ZESTRIL) 30 MG tablet; Take 1 tablet (30 mg total) by mouth daily. -     BMP8+EGFR  Hyperlipidemia, unspecified hyperlipidemia type Stable, cont med -     atorvastatin (LIPITOR) 40 MG tablet; Take 1 tablet (40 mg total) by mouth at bedtime.  Hypothyroidism, unspecified type Stable, cont levothyroxine  Anxiety Stable, improved Sister getting treatment for cancer but has been doing well Cont below -     DULoxetine (CYMBALTA) 60 MG capsule; Take 1 capsule (60 mg total) by mouth daily.  Allergic rhinitis  due to other allergic trigger, unspecified seasonality Stable, cont below  Anemia, unspecified type Cont iron -rich food diet Trial off of iron, not taking daily now and causing constipation -     CBC with Differential  Follow up plan: Return in about 3 months (around 07/20/2017). Assunta Found, MD [place]

## 2017-04-21 ENCOUNTER — Telehealth: Payer: Self-pay | Admitting: Family Medicine

## 2017-04-21 DIAGNOSIS — R928 Other abnormal and inconclusive findings on diagnostic imaging of breast: Secondary | ICD-10-CM | POA: Diagnosis not present

## 2017-04-21 DIAGNOSIS — E039 Hypothyroidism, unspecified: Secondary | ICD-10-CM

## 2017-04-21 DIAGNOSIS — J3089 Other allergic rhinitis: Secondary | ICD-10-CM

## 2017-04-21 DIAGNOSIS — N6489 Other specified disorders of breast: Secondary | ICD-10-CM | POA: Diagnosis not present

## 2017-04-21 LAB — BMP8+EGFR
BUN/Creatinine Ratio: 24 (ref 12–28)
BUN: 32 mg/dL — ABNORMAL HIGH (ref 8–27)
CALCIUM: 9.8 mg/dL (ref 8.7–10.3)
CO2: 20 mmol/L (ref 20–29)
CREATININE: 1.31 mg/dL — AB (ref 0.57–1.00)
Chloride: 105 mmol/L (ref 96–106)
GFR calc Af Amer: 45 mL/min/{1.73_m2} — ABNORMAL LOW (ref >59)
GFR, EST NON AFRICAN AMERICAN: 39 mL/min/{1.73_m2} — AB (ref >59)
Glucose: 115 mg/dL — ABNORMAL HIGH (ref 65–99)
Potassium: 4.8 mmol/L (ref 3.5–5.2)
Sodium: 141 mmol/L (ref 134–144)

## 2017-04-21 LAB — CBC WITH DIFFERENTIAL/PLATELET
BASOS: 1 %
Basophils Absolute: 0.1 10*3/uL (ref 0.0–0.2)
EOS (ABSOLUTE): 0.2 10*3/uL (ref 0.0–0.4)
Eos: 4 %
Hematocrit: 33.2 % — ABNORMAL LOW (ref 34.0–46.6)
Hemoglobin: 10.7 g/dL — ABNORMAL LOW (ref 11.1–15.9)
IMMATURE GRANULOCYTES: 0 %
Immature Grans (Abs): 0 10*3/uL (ref 0.0–0.1)
LYMPHS: 28 %
Lymphocytes Absolute: 1.6 10*3/uL (ref 0.7–3.1)
MCH: 30.2 pg (ref 26.6–33.0)
MCHC: 32.2 g/dL (ref 31.5–35.7)
MCV: 94 fL (ref 79–97)
Monocytes Absolute: 0.8 10*3/uL (ref 0.1–0.9)
Monocytes: 13 %
NEUTROS PCT: 54 %
Neutrophils Absolute: 3.1 10*3/uL (ref 1.4–7.0)
PLATELETS: 263 10*3/uL (ref 150–379)
RBC: 3.54 x10E6/uL — AB (ref 3.77–5.28)
RDW: 15 % (ref 12.3–15.4)
WBC: 5.7 10*3/uL (ref 3.4–10.8)

## 2017-04-21 MED ORDER — LEVOTHYROXINE SODIUM 50 MCG PO TABS: ORAL_TABLET | ORAL | 1 refills | Status: DC

## 2017-04-21 MED ORDER — FLUTICASONE PROPIONATE 50 MCG/ACT NA SUSP: 2.0000 | Freq: Every day | NASAL | 5 refills | Status: DC | PRN

## 2017-04-21 NOTE — Telephone Encounter (Signed)
Medication resent to [facility]

## 2017-04-22 ENCOUNTER — Encounter: Payer: Self-pay | Admitting: *Deleted

## 2017-05-04 ENCOUNTER — Other Ambulatory Visit: Payer: Self-pay | Admitting: Family Medicine

## 2017-05-04 ENCOUNTER — Telehealth: Payer: Self-pay | Admitting: Family Medicine

## 2017-05-04 ENCOUNTER — Other Ambulatory Visit: Payer: Medicare Other

## 2017-05-04 DIAGNOSIS — R300 Dysuria: Secondary | ICD-10-CM | POA: Diagnosis not present

## 2017-05-04 LAB — URINALYSIS, COMPLETE
Bilirubin, UA: NEGATIVE
GLUCOSE, UA: NEGATIVE
Ketones, UA: NEGATIVE
Nitrite, UA: NEGATIVE
RBC, UA: NEGATIVE
Specific Gravity, UA: 1.015 (ref 1.005–1.030)
Urobilinogen, Ur: 0.2 mg/dL (ref 0.2–1.0)
pH, UA: 5.5 (ref 5.0–7.5)

## 2017-05-04 LAB — MICROSCOPIC EXAMINATION: Renal Epithel, UA: NONE SEEN /hpf

## 2017-05-04 NOTE — Telephone Encounter (Signed)
Test ordered [facility]

## 2017-05-04 NOTE — Telephone Encounter (Signed)
Emergency contact [name] notified and she will bring specimen by

## 2017-05-06 ENCOUNTER — Other Ambulatory Visit: Payer: Self-pay | Admitting: Family Medicine

## 2017-05-06 MED ORDER — CIPROFLOXACIN HCL 250 MG PO TABS: 250.0000 mg | ORAL_TABLET | Freq: Two times a day (BID) | ORAL | 0 refills | Status: DC

## 2017-05-07 LAB — URINE CULTURE

## 2017-05-08 NOTE — Progress Notes (Signed)
Patient aware.

## 2017-05-18 ENCOUNTER — Ambulatory Visit (INDEPENDENT_AMBULATORY_CARE_PROVIDER_SITE_OTHER): Payer: Medicare Other | Admitting: *Deleted

## 2017-05-18 DIAGNOSIS — Z23 Encounter for immunization: Secondary | ICD-10-CM | POA: Diagnosis not present

## 2017-06-08 ENCOUNTER — Other Ambulatory Visit: Payer: Self-pay | Admitting: Pediatrics

## 2017-06-08 ENCOUNTER — Other Ambulatory Visit: Payer: Self-pay | Admitting: Family Medicine

## 2017-06-08 DIAGNOSIS — K219 Gastro-esophageal reflux disease without esophagitis: Secondary | ICD-10-CM

## 2017-06-08 DIAGNOSIS — M1990 Unspecified osteoarthritis, unspecified site: Secondary | ICD-10-CM

## 2017-06-08 DIAGNOSIS — E785 Hyperlipidemia, unspecified: Secondary | ICD-10-CM

## 2017-06-08 DIAGNOSIS — M19011 Primary osteoarthritis, right shoulder: Secondary | ICD-10-CM | POA: Diagnosis not present

## 2017-06-08 DIAGNOSIS — I10 Essential (primary) hypertension: Secondary | ICD-10-CM

## 2017-06-08 DIAGNOSIS — M19012 Primary osteoarthritis, left shoulder: Secondary | ICD-10-CM | POA: Diagnosis not present

## 2017-06-08 DIAGNOSIS — F419 Anxiety disorder, unspecified: Secondary | ICD-10-CM

## 2017-07-05 DIAGNOSIS — R300 Dysuria: Secondary | ICD-10-CM | POA: Diagnosis not present

## 2017-07-05 DIAGNOSIS — N3000 Acute cystitis without hematuria: Secondary | ICD-10-CM | POA: Diagnosis not present

## 2017-08-10 DIAGNOSIS — M1711 Unilateral primary osteoarthritis, right knee: Secondary | ICD-10-CM | POA: Diagnosis not present

## 2017-08-10 DIAGNOSIS — M170 Bilateral primary osteoarthritis of knee: Secondary | ICD-10-CM | POA: Diagnosis not present

## 2017-08-10 DIAGNOSIS — M1712 Unilateral primary osteoarthritis, left knee: Secondary | ICD-10-CM | POA: Diagnosis not present

## 2017-08-20 ENCOUNTER — Ambulatory Visit: Payer: Medicare Other | Admitting: Pediatrics

## 2017-08-25 ENCOUNTER — Encounter: Payer: Self-pay | Admitting: Pediatrics

## 2017-08-25 ENCOUNTER — Ambulatory Visit (INDEPENDENT_AMBULATORY_CARE_PROVIDER_SITE_OTHER): Payer: Medicare Other | Admitting: Pediatrics

## 2017-08-25 VITALS — BP 139/84 | HR 83 | Temp 96.9°F | Ht 62.0 in

## 2017-08-25 DIAGNOSIS — I10 Essential (primary) hypertension: Secondary | ICD-10-CM

## 2017-08-25 DIAGNOSIS — R609 Edema, unspecified: Secondary | ICD-10-CM

## 2017-08-25 DIAGNOSIS — K3189 Other diseases of stomach and duodenum: Secondary | ICD-10-CM | POA: Diagnosis not present

## 2017-08-25 MED ORDER — FUROSEMIDE 20 MG PO TABS: 20.0000 mg | ORAL_TABLET | Freq: Every day | ORAL | 1 refills | Status: DC | PRN

## 2017-08-25 MED ORDER — DICYCLOMINE HCL 10 MG PO CAPS: 10.0000 mg | ORAL_CAPSULE | Freq: Three times a day (TID) | ORAL | 1 refills | Status: DC | PRN

## 2017-08-25 NOTE — Progress Notes (Addendum)
  Subjective:   Patient ID: Tamara Stein, female    DOB: 29-Apr-1941, 77 y.o.   MRN: [id number] CC: Edema (legs)  HPI: [name] is a 77 y.o. female presenting for Edema (legs)  Here today with her caretaker who is with her during the day  For a couple weeks has had increased swelling in her lower legs Swelling is much better today. Continues to drink minimal fluids, she says about a 12 ounce glass a day Does take Lasix as needed, has not taken for the last 2 weeks with this increased swelling however. She is not sure what help to make the swelling go down. Denies eating any increased salt over the last few weeks  Has had a couple episodes of "explosive diarrhea" in the last week. Seems unrelated to eating Has cramping before the episode then needs to find a bathroom immediately Cramping abdominal healing goes away after passing stool. As patient is confined to a wheelchair this is difficult No change in her eating habits, appetite is been fine No fevers In between episodes of loose stool she does have small hard tools.  Does not think she is going every day.  Does not take anything for constipation right now.  Patient is confined to her power wheelchair  Relevant past medical, surgical, family and social history reviewed. Allergies and medications reviewed and updated. Social History   Tobacco Use  Smoking Status Never Smoker  Smokeless Tobacco Never Used   ROS: Per HPI   Objective:    BP 139/84   Pulse 83   Temp (!) 96.9 F (36.1 C) (Oral)   Ht _0  (1.575 m)   BMI 36.95 kg/m   Wt Readings from Last 3 Encounters:  08/16/16 202 lb (91.6 kg)  07/01/16 202 lb (91.6 kg)  05/19/16 212 lb (96.2 kg)    Gen: NAD, alert, cooperative with exam, NCAT EYES: EOMI, no conjunctival injection, or no icterus ENT:  OP without erythema LYMPH: no cervical LAD CV: NRRR, normal S1/S2, no murmur, distal pulses 2+ b/l Resp: CTABL, no wheezes, normal WOB Abd: +BS, soft,  obese, NTND. Ext: No pitting edema, warm Neuro: Alert and oriented  Assessment & Plan:  Avalene was seen today for edema, now improved.  Diagnoses and all orders for this visit:  Swelling Refilled Lasix, take as needed for increased swelling in lower legs.  If does not improve within a few days, patient needs to be seen -     furosemide (LASIX) 20 MG tablet; Take 1 tablet (20 mg total) by mouth daily as needed. As needed for swelling  Stomach spasm Will do trial of below -     dicyclomine (BENTYL) 10 MG capsule; Take 1 capsule (10 mg total) by mouth 3 (three) times daily as needed for spasms.  Essential hypertension Continue current med, will repeat blood work. -     CBC with Differential/Platelet -     CMP14+EGFR  Patient is confined to her power wheelchair   Follow up plan: Return in about 3 months (around 11/23/2017). Assunta Found, MD [place]

## 2017-08-26 LAB — CMP14+EGFR
A/G RATIO: 1.6 (ref 1.2–2.2)
ALK PHOS: 71 IU/L (ref 39–117)
ALT: 38 IU/L — ABNORMAL HIGH (ref 0–32)
AST: 21 IU/L (ref 0–40)
Albumin: 4.1 g/dL (ref 3.5–4.8)
BUN/Creatinine Ratio: 25 (ref 12–28)
BUN: 32 mg/dL — ABNORMAL HIGH (ref 8–27)
Bilirubin Total: 0.2 mg/dL (ref 0.0–1.2)
CO2: 21 mmol/L (ref 20–29)
Calcium: 9.4 mg/dL (ref 8.7–10.3)
Chloride: 106 mmol/L (ref 96–106)
Creatinine, Ser: 1.26 mg/dL — ABNORMAL HIGH (ref 0.57–1.00)
GFR calc Af Amer: 47 mL/min/{1.73_m2} — ABNORMAL LOW (ref >59)
GFR calc non Af Amer: 41 mL/min/{1.73_m2} — ABNORMAL LOW (ref >59)
GLOBULIN, TOTAL: 2.5 g/dL (ref 1.5–4.5)
Glucose: 89 mg/dL (ref 65–99)
POTASSIUM: 4.8 mmol/L (ref 3.5–5.2)
SODIUM: 144 mmol/L (ref 134–144)
Total Protein: 6.6 g/dL (ref 6.0–8.5)

## 2017-08-26 LAB — CBC WITH DIFFERENTIAL/PLATELET
BASOS: 1 %
Basophils Absolute: 0 10*3/uL (ref 0.0–0.2)
EOS (ABSOLUTE): 0.3 10*3/uL (ref 0.0–0.4)
EOS: 3 %
HEMATOCRIT: 38.3 % (ref 34.0–46.6)
Hemoglobin: 12 g/dL (ref 11.1–15.9)
Immature Grans (Abs): 0 10*3/uL (ref 0.0–0.1)
Immature Granulocytes: 1 %
LYMPHS ABS: 2.1 10*3/uL (ref 0.7–3.1)
Lymphs: 27 %
MCH: 29.8 pg (ref 26.6–33.0)
MCHC: 31.3 g/dL — AB (ref 31.5–35.7)
MCV: 95 fL (ref 79–97)
MONOS ABS: 1 10*3/uL — AB (ref 0.1–0.9)
Monocytes: 13 %
Neutrophils Absolute: 4.4 10*3/uL (ref 1.4–7.0)
Neutrophils: 55 %
Platelets: 265 10*3/uL (ref 150–379)
RBC: 4.03 x10E6/uL (ref 3.77–5.28)
RDW: 15.1 % (ref 12.3–15.4)
WBC: 7.9 10*3/uL (ref 3.4–10.8)

## 2017-09-15 ENCOUNTER — Other Ambulatory Visit: Payer: Self-pay | Admitting: Pediatrics

## 2017-09-15 DIAGNOSIS — K219 Gastro-esophageal reflux disease without esophagitis: Secondary | ICD-10-CM

## 2017-09-15 DIAGNOSIS — M1990 Unspecified osteoarthritis, unspecified site: Secondary | ICD-10-CM

## 2017-09-15 DIAGNOSIS — E785 Hyperlipidemia, unspecified: Secondary | ICD-10-CM

## 2017-09-15 DIAGNOSIS — F419 Anxiety disorder, unspecified: Secondary | ICD-10-CM

## 2017-09-15 DIAGNOSIS — I10 Essential (primary) hypertension: Secondary | ICD-10-CM

## 2017-09-15 DIAGNOSIS — E039 Hypothyroidism, unspecified: Secondary | ICD-10-CM

## 2017-09-17 ENCOUNTER — Other Ambulatory Visit: Payer: Self-pay

## 2017-09-17 DIAGNOSIS — E785 Hyperlipidemia, unspecified: Secondary | ICD-10-CM

## 2017-09-17 DIAGNOSIS — M1990 Unspecified osteoarthritis, unspecified site: Secondary | ICD-10-CM

## 2017-09-17 DIAGNOSIS — E039 Hypothyroidism, unspecified: Secondary | ICD-10-CM

## 2017-09-17 MED ORDER — ATORVASTATIN CALCIUM 40 MG PO TABS: 40.0000 mg | ORAL_TABLET | Freq: Every day | ORAL | 0 refills | Status: DC

## 2017-09-17 MED ORDER — LEVOTHYROXINE SODIUM 50 MCG PO TABS: ORAL_TABLET | ORAL | 0 refills | Status: DC

## 2017-09-17 MED ORDER — DICLOFENAC SODIUM 50 MG PO TBEC: 50.0000 mg | DELAYED_RELEASE_TABLET | Freq: Three times a day (TID) | ORAL | 0 refills | Status: DC

## 2017-09-17 NOTE — Telephone Encounter (Signed)
Last seen 08/25/17  Dr Evette Doffing  Last lipid 06/19/15  Last thyroid 7.21.17

## 2017-09-24 ENCOUNTER — Telehealth: Payer: Self-pay | Admitting: Pediatrics

## 2017-09-24 NOTE — Telephone Encounter (Signed)
Needs to be seen if fevers, SOB, trouble breathing. Can take tylenol regularly, use flonase, salt water nose sprays for symptomatic relief.

## 2017-09-24 NOTE — Telephone Encounter (Signed)
pls advise. Do u want her to see someone else

## 2017-09-24 NOTE — Telephone Encounter (Signed)
What symptoms do you have? Head cold Was coming in to see [facility] but Evette Doffing is off.  How long have you been sick? Since yesterday  Have you been seen for this problem? NO  If your provider decides to give you a prescription, which pharmacy would you like for it to be sent to? [facility] in [place]   Patient informed that this information will be sent to the clinical staff for review and that they should receive a follow up call.

## 2017-09-30 ENCOUNTER — Ambulatory Visit (INDEPENDENT_AMBULATORY_CARE_PROVIDER_SITE_OTHER): Payer: Medicare Other | Admitting: Pediatrics

## 2017-09-30 ENCOUNTER — Encounter: Payer: Self-pay | Admitting: Pediatrics

## 2017-09-30 VITALS — BP 153/78 | HR 87 | Temp 96.5°F | Ht 62.0 in

## 2017-09-30 DIAGNOSIS — R0981 Nasal congestion: Secondary | ICD-10-CM | POA: Diagnosis not present

## 2017-09-30 DIAGNOSIS — J0100 Acute maxillary sinusitis, unspecified: Secondary | ICD-10-CM | POA: Diagnosis not present

## 2017-09-30 LAB — VERITOR FLU A/B WAIVED
INFLUENZA B: NEGATIVE
Influenza A: NEGATIVE

## 2017-09-30 MED ORDER — DOXYCYCLINE HYCLATE 100 MG PO TABS: 100.0000 mg | ORAL_TABLET | Freq: Two times a day (BID) | ORAL | 0 refills | Status: DC

## 2017-09-30 NOTE — Progress Notes (Signed)
  Subjective:   Patient ID: Tamara Stein, female    DOB: 1941/04/18, 77 y.o.   MRN: [id number] CC: Nasal Congestion (achy)  HPI: Tamara Stein is a 77 y.o. female presenting for Nasal Congestion (achy)  Sick for the past week. Nasal congestion bothering her the most.   No fevers she knows of, does get hot and cold at times. Nose stopped ears stopped. Decreased appetite.   Has taken some delsym at home, aide thinks it helped some. Pt wasn't sure. Has been taking alkaseltxer plus, has raised BP.   Relevant past medical, surgical, family and social history reviewed. Allergies and medications reviewed and updated. Social History   Tobacco Use  Smoking Status Never Smoker  Smokeless Tobacco Never Used   ROS: Per HPI   Objective:    BP (!) 153/78 (BP Location: Left Arm, Cuff Size: Large)   Pulse 87   Temp (!) 96.5 F (35.8 C) (Oral)   Ht 5' 2"  (1.575 m)   BMI 36.95 kg/m   Wt Readings from Last 3 Encounters:  08/16/16 202 lb (91.6 kg)  07/01/16 202 lb (91.6 kg)  05/19/16 212 lb (96.2 kg)    Gen: NAD, alert, in wheelchair, slightly hoarse, NCAT, congested EYES: EOMI, no conjunctival injection, or no icterus ENT:  TMs pearly gray b/l, OP without erythema LYMPH: no cervical LAD CV: NRRR, normal S1/S2 Resp: CTABL, no wheezes, normal WOB Abd: +BS, soft, NTND.  Ext: No edema, warm Neuro: Alert and oriented MSK: normal muscle bulk  Assessment & Plan:  [name] was seen today for nasal congestion.  Diagnoses and all orders for this visit:  Nasal congestion Flu neg -     Veritor Flu A/B Waived  Acute non-recurrent maxillary sinusitis Start below if no improvement next few days with sinus rinses, flonase -     doxycycline (VIBRA-TABS) 100 MG tablet; Take 1 tablet (100 mg total) by mouth 2 (two) times daily.   Follow up plan: Return if symptoms worsen or fail to improve. Assunta Found, MD [place]

## 2017-09-30 NOTE — Patient Instructions (Signed)
Start antibiotic if feeling worse over the weekend  Use delsym, honey, water, lozenges, tylenol as needed  Use nose sprays such as flonase and salt water nose spray for congestion

## 2017-10-05 NOTE — Telephone Encounter (Signed)
Patient seen 02/20

## 2017-10-08 ENCOUNTER — Telehealth: Payer: Self-pay | Admitting: Pediatrics

## 2017-10-08 NOTE — Telephone Encounter (Signed)
She wanted appt, can not get a ride till Wednesday. Appt made

## 2017-10-14 ENCOUNTER — Ambulatory Visit (INDEPENDENT_AMBULATORY_CARE_PROVIDER_SITE_OTHER): Payer: Medicare Other | Admitting: Pediatrics

## 2017-10-14 ENCOUNTER — Encounter: Payer: Self-pay | Admitting: Pediatrics

## 2017-10-14 VITALS — BP 131/86 | HR 87 | Temp 97.0°F | Ht 62.0 in

## 2017-10-14 DIAGNOSIS — M79605 Pain in left leg: Secondary | ICD-10-CM

## 2017-10-14 DIAGNOSIS — G4733 Obstructive sleep apnea (adult) (pediatric): Secondary | ICD-10-CM

## 2017-10-14 DIAGNOSIS — M79604 Pain in right leg: Secondary | ICD-10-CM | POA: Diagnosis not present

## 2017-10-14 DIAGNOSIS — K3189 Other diseases of stomach and duodenum: Secondary | ICD-10-CM

## 2017-10-14 MED ORDER — DICYCLOMINE HCL 10 MG PO CAPS: 10.0000 mg | ORAL_CAPSULE | Freq: Three times a day (TID) | ORAL | 1 refills | Status: DC | PRN

## 2017-10-14 NOTE — Progress Notes (Signed)
  Subjective:   Patient ID: Tamara Stein, female    DOB: 08-Feb-1941, 77 y.o.   MRN: [id number] CC: Nausea; Diarrhea; and Generalized Body Aches  HPI: Tamara Stein is a 77 y.o. female presenting for Nausea; Diarrhea; and Generalized Body Aches  Two days ago felt chilled, was having spasms of stomach pain. Had two days four days ago of having a couple of "explosive" stools. Last stool this morning, normal. No diarrhea. Feels slightly nauseous at times, no vomiting.  No blood in diarrhea.  Appetite has been okay  Slept all day yesterday. Felt good yesterday evening. No shoulder  pain, no aching. Feeling well still this morning.   Legs feel tight, swollen, hurt at times. No particular place.   Obstructive sleep apnea: Had initial test, positive for OSA.  Never went back for CPAP fitting and sleep study.  Here today with her aide.  Says she is set up and then canceled multiple times.  She does have some snoring.  She does feel sleepy during the day.  Sometimes she does have a headache in the morning.  No recent fevers.  No dysuria.  No stomach pain now.  No bladder spasms.  Relevant past medical, surgical, family and social history reviewed. Allergies and medications reviewed and updated. Social History   Tobacco Use  Smoking Status Never Smoker  Smokeless Tobacco Never Used   ROS: Per HPI   Objective:    BP 131/86   Pulse 87   Temp (!) 97 F (36.1 C) (Oral)   Ht 5' 2"  (1.575 m)   BMI 36.95 kg/m   Wt Readings from Last 3 Encounters:  08/16/16 202 lb (91.6 kg)  07/01/16 202 lb (91.6 kg)  05/19/16 212 lb (96.2 kg)    Gen: NAD, alert, cooperative with exam, NCAT EYES: EOMI, no conjunctival injection, or no icterus ENT:  TMs pearly gray b/l, OP without erythema LYMPH: no cervical LAD CV: NRRR, normal S1/S2, no murmur, distal pulses 2+ b/l Resp: CTABL, no wheezes, normal WOB Abd: +BS, soft, NTND. no guarding or organomegaly Ext: No pitting edema, warm Neuro: Alert and  oriented, in wheelchair MSK: normal muscle bulk  Assessment & Plan:  Etheleen was seen today for multiple medical problems  Diagnoses and all orders for this visit:  OSA (obstructive sleep apnea) Encouraged follow-up so she can be fit for the CPAP machine.  Patient says she will think about it.  Stomach spasm Reasonable to try below.  Patient has not yet started.  Ongoing symptoms would recommend seeing GI. -     dicyclomine (BENTYL) 10 MG capsule; Take 1 capsule (10 mg total) by mouth 3 (three) times daily as needed for spasms.  Morning okay 3 patient did not come so does not  Pain in both lower extremities Not one place in her legs that hurt.  Not joints.  Not skin.  No redness or rash.  Patient with generalized swelling in legs but no pitting edema.  Unchanged from baseline.    Follow up plan: Return in about 3 months (around 01/14/2018). Assunta Found, MD [place]

## 2017-10-20 ENCOUNTER — Ambulatory Visit: Payer: Medicare Other | Admitting: Family Medicine

## 2017-10-20 ENCOUNTER — Other Ambulatory Visit: Payer: Self-pay | Admitting: Pediatrics

## 2017-10-20 DIAGNOSIS — F419 Anxiety disorder, unspecified: Secondary | ICD-10-CM

## 2017-10-20 DIAGNOSIS — J3089 Other allergic rhinitis: Secondary | ICD-10-CM

## 2017-10-20 DIAGNOSIS — I10 Essential (primary) hypertension: Secondary | ICD-10-CM

## 2017-10-20 DIAGNOSIS — M1990 Unspecified osteoarthritis, unspecified site: Secondary | ICD-10-CM

## 2017-10-20 DIAGNOSIS — E039 Hypothyroidism, unspecified: Secondary | ICD-10-CM

## 2017-10-20 DIAGNOSIS — E785 Hyperlipidemia, unspecified: Secondary | ICD-10-CM

## 2017-10-20 DIAGNOSIS — K219 Gastro-esophageal reflux disease without esophagitis: Secondary | ICD-10-CM

## 2017-10-20 MED ORDER — AMLODIPINE BESYLATE 10 MG PO TABS
10.0000 mg | ORAL_TABLET | Freq: Every day | ORAL | 1 refills | Status: DC
Start: 2017-10-20 — End: 2018-04-28

## 2017-10-20 MED ORDER — LISINOPRIL 30 MG PO TABS: 30.0000 mg | ORAL_TABLET | Freq: Every day | ORAL | 0 refills | Status: DC

## 2017-10-20 MED ORDER — DULOXETINE HCL 60 MG PO CPEP: 60.0000 mg | ORAL_CAPSULE | Freq: Every day | ORAL | 0 refills | Status: DC

## 2017-10-20 MED ORDER — DICLOFENAC SODIUM 50 MG PO TBEC: 50.0000 mg | DELAYED_RELEASE_TABLET | Freq: Three times a day (TID) | ORAL | 0 refills | Status: DC

## 2017-10-20 MED ORDER — FLUTICASONE PROPIONATE 50 MCG/ACT NA SUSP: 2.0000 | Freq: Every day | NASAL | 1 refills | Status: DC | PRN

## 2017-10-20 MED ORDER — OMEPRAZOLE 40 MG PO CPDR: 40.0000 mg | DELAYED_RELEASE_CAPSULE | Freq: Every day | ORAL | 0 refills | Status: DC

## 2017-10-20 MED ORDER — ATORVASTATIN CALCIUM 40 MG PO TABS: 40.0000 mg | ORAL_TABLET | Freq: Every day | ORAL | 0 refills | Status: DC

## 2017-10-20 MED ORDER — LEVOTHYROXINE SODIUM 50 MCG PO TABS: ORAL_TABLET | ORAL | 0 refills | Status: DC

## 2017-10-20 MED ORDER — RALOXIFENE HCL 60 MG PO TABS: 60.0000 mg | ORAL_TABLET | Freq: Every day | ORAL | 0 refills | Status: DC

## 2017-10-20 NOTE — Telephone Encounter (Signed)
Pt aware refills sent to [facility] mail order

## 2017-10-20 NOTE — Telephone Encounter (Signed)
What is the name of the medication? Raloxifene 60 mg, Diclofenac 50 mg, Lisinopril 30 mg, Atorvastatin 40 mg, Omeprazole 40 mg, Duloxetine 60 mg, Amlodipine 10 mg, Levothyroxine 0.05 mg, Fluticasone 50 MCG  Have you contacted your pharmacy to request a refill? Yes- New pharmacy  Which pharmacy would you like this sent to? [facility] - Mail order   Patient notified that their request is being sent to the clinical staff for review and that they should receive a call once it is complete. If they do not receive a call within 24 hours they can check with their pharmacy or our office.

## 2017-10-29 ENCOUNTER — Other Ambulatory Visit: Payer: Medicare Other

## 2017-10-29 DIAGNOSIS — M1712 Unilateral primary osteoarthritis, left knee: Secondary | ICD-10-CM | POA: Diagnosis not present

## 2017-10-29 DIAGNOSIS — M7542 Impingement syndrome of left shoulder: Secondary | ICD-10-CM | POA: Diagnosis not present

## 2017-12-09 ENCOUNTER — Encounter: Payer: Self-pay | Admitting: Pediatrics

## 2017-12-09 ENCOUNTER — Ambulatory Visit (INDEPENDENT_AMBULATORY_CARE_PROVIDER_SITE_OTHER): Payer: Medicare Other | Admitting: Pediatrics

## 2017-12-09 VITALS — BP 132/76 | HR 83 | Temp 97.8°F | Ht 62.0 in

## 2017-12-09 DIAGNOSIS — R6883 Chills (without fever): Secondary | ICD-10-CM | POA: Diagnosis not present

## 2017-12-09 NOTE — Patient Instructions (Signed)
Tamara Crutch, MD  [address]  [place], [place] [address]  [phone]

## 2017-12-09 NOTE — Progress Notes (Signed)
  Subjective:   Patient ID: Tamara Stein, female    DOB: 15-Jan-1941, 77 y.o.   MRN: [id number] CC: Fatigue and Chills  HPI: Tamara Stein is a 77 y.o. female   Fatigue, chills, feeling not herself has been ongoing off and on for a few weeks. Urine has had an odor, tends to stay dehydrated because getting to the bathroom is hard for her bc she is confined to wheelchair. Here today with her aid.   Appetite she says is down from usual, has been eating regular meals.  Not skipping any meals.  Not feeling nauseous with eating.  No abdominal pain.  No dysuria.  Does not think she is been having any fevers.  Relevant past medical, surgical, family and social history reviewed. Allergies and medications reviewed and updated. Social History   Tobacco Use  Smoking Status Never Smoker  Smokeless Tobacco Never Used   ROS: Per HPI   Objective:    BP 132/76   Pulse 83   Temp 97.8 F (36.6 C) (Oral)   Ht 5' 2"  (1.575 m)   BMI 36.95 kg/m   Wt Readings from Last 3 Encounters:  08/16/16 202 lb (91.6 kg)  07/01/16 202 lb (91.6 kg)  05/19/16 212 lb (96.2 kg)    Gen: NAD, alert, cooperative with exam, NCAT EYES: EOMI, no conjunctival injection, or no icterus ENT:   OP without erythema LYMPH: no cervical LAD CV: NRRR, normal S1/S2 Resp: CTABL, no wheezes, normal WOB Abd: +BS, soft, NTND. no guarding or organomegaly Ext: No pitting edema, warm Neuro: Alert and oriented, wheelchair-bound  Assessment & Plan:  Tamara Stein was seen today for fatigue and chills.  Diagnoses and all orders for this visit:  Chills Describes decreased appetite but has been eating regular amounts for meals.  No nausea or abdominal pain.  Symptoms nonfocal.  Will get blood work.  History of urinary tract infections.  Will test for UTI.  Intermittent symptoms. -     Urinalysis, Complete -     Urine Culture -     CMP14+EGFR -     CBC with Differential  Other orders -     Microscopic Examination   Follow  up plan: As needed. Tamara Found, MD [place]

## 2017-12-10 LAB — MICROSCOPIC EXAMINATION: CASTS: NONE SEEN /LPF

## 2017-12-10 LAB — CMP14+EGFR
ALBUMIN: 3.7 g/dL (ref 3.5–4.8)
ALT: 19 IU/L (ref 0–32)
AST: 18 IU/L (ref 0–40)
Albumin/Globulin Ratio: 1.5 (ref 1.2–2.2)
Alkaline Phosphatase: 62 IU/L (ref 39–117)
BUN/Creatinine Ratio: 24 (ref 12–28)
BUN: 29 mg/dL — ABNORMAL HIGH (ref 8–27)
Bilirubin Total: 0.3 mg/dL (ref 0.0–1.2)
CALCIUM: 9.3 mg/dL (ref 8.7–10.3)
CO2: 20 mmol/L (ref 20–29)
CREATININE: 1.23 mg/dL — AB (ref 0.57–1.00)
Chloride: 108 mmol/L — ABNORMAL HIGH (ref 96–106)
GFR calc Af Amer: 49 mL/min/{1.73_m2} — ABNORMAL LOW (ref >59)
GFR, EST NON AFRICAN AMERICAN: 42 mL/min/{1.73_m2} — AB (ref >59)
GLOBULIN, TOTAL: 2.4 g/dL (ref 1.5–4.5)
Glucose: 93 mg/dL (ref 65–99)
Potassium: 4.5 mmol/L (ref 3.5–5.2)
SODIUM: 142 mmol/L (ref 134–144)
TOTAL PROTEIN: 6.1 g/dL (ref 6.0–8.5)

## 2017-12-10 LAB — URINALYSIS, COMPLETE
Bilirubin, UA: NEGATIVE
Glucose, UA: NEGATIVE
KETONES UA: NEGATIVE
Nitrite, UA: NEGATIVE
PH UA: 5 (ref 5.0–7.5)
RBC, UA: NEGATIVE
Specific Gravity, UA: 1.018 (ref 1.005–1.030)
Urobilinogen, Ur: 0.2 mg/dL (ref 0.2–1.0)

## 2017-12-10 LAB — CBC WITH DIFFERENTIAL/PLATELET
Basophils Absolute: 0.1 10*3/uL (ref 0.0–0.2)
Basos: 2 %
EOS (ABSOLUTE): 0.4 10*3/uL (ref 0.0–0.4)
Eos: 7 %
HEMOGLOBIN: 10.8 g/dL — AB (ref 11.1–15.9)
Hematocrit: 33.4 % — ABNORMAL LOW (ref 34.0–46.6)
IMMATURE GRANS (ABS): 0 10*3/uL (ref 0.0–0.1)
IMMATURE GRANULOCYTES: 0 %
LYMPHS: 30 %
Lymphocytes Absolute: 1.6 10*3/uL (ref 0.7–3.1)
MCH: 30.6 pg (ref 26.6–33.0)
MCHC: 32.3 g/dL (ref 31.5–35.7)
MCV: 95 fL (ref 79–97)
MONOCYTES: 10 %
Monocytes Absolute: 0.5 10*3/uL (ref 0.1–0.9)
NEUTROS PCT: 51 %
Neutrophils Absolute: 2.7 10*3/uL (ref 1.4–7.0)
PLATELETS: 203 10*3/uL (ref 150–379)
RBC: 3.53 x10E6/uL — ABNORMAL LOW (ref 3.77–5.28)
RDW: 14.4 % (ref 12.3–15.4)
WBC: 5.2 10*3/uL (ref 3.4–10.8)

## 2017-12-11 ENCOUNTER — Telehealth: Payer: Self-pay | Admitting: Pediatrics

## 2017-12-11 NOTE — Telephone Encounter (Signed)
DR Evette Doffing - the pt is calling about 5/1 results.    Pt aware that you are off today.

## 2017-12-12 ENCOUNTER — Other Ambulatory Visit: Payer: Self-pay | Admitting: Pediatrics

## 2017-12-12 ENCOUNTER — Encounter: Payer: Self-pay | Admitting: Pediatrics

## 2017-12-12 DIAGNOSIS — N3000 Acute cystitis without hematuria: Secondary | ICD-10-CM

## 2017-12-12 LAB — URINE CULTURE

## 2017-12-12 MED ORDER — CEPHALEXIN 500 MG PO CAPS: 500.0000 mg | ORAL_CAPSULE | Freq: Two times a day (BID) | ORAL | 0 refills | Status: DC

## 2017-12-12 NOTE — Telephone Encounter (Signed)
Urine culture not yet fully resulted.  Does show some bacterial growth.  I went ahead and sent in Keflex.  On Monday can you double check that the bacteria that grows is sensitive to Keflex?  Or bring to covering provider's attention.  See result note.

## 2017-12-14 NOTE — Telephone Encounter (Signed)
Aware that antibiotic sent in will cover infection

## 2017-12-18 ENCOUNTER — Ambulatory Visit (INDEPENDENT_AMBULATORY_CARE_PROVIDER_SITE_OTHER): Payer: Medicare Other | Admitting: Family Medicine

## 2017-12-18 ENCOUNTER — Encounter: Payer: Self-pay | Admitting: Family Medicine

## 2017-12-18 VITALS — BP 127/76 | HR 83 | Temp 97.4°F | Ht 62.0 in

## 2017-12-18 DIAGNOSIS — M25512 Pain in left shoulder: Secondary | ICD-10-CM

## 2017-12-18 DIAGNOSIS — G8929 Other chronic pain: Secondary | ICD-10-CM | POA: Diagnosis not present

## 2017-12-18 DIAGNOSIS — M25511 Pain in right shoulder: Secondary | ICD-10-CM | POA: Diagnosis not present

## 2017-12-18 NOTE — Progress Notes (Signed)
Subjective: CC: Right shoulder injury PCP: Tamara Maize, MD CVE:[id number] Tamara Stein is a 77 y.o. female, who is accompanied by [name] today's visit.  She is presenting to clinic today for:  1.  Bilateral shoulder pain Patient reports that she has been having bilateral shoulder pain for about 3 weeks.  She describes the pain as throbbing in nature.  She notes that she has difficulty with raising upper extremities secondary to pain.  She gets regular corticosteroid injections with Dr. Gladstone Lighter.  She is not due for another one until June.  She denies any preceding injury to the shoulders but does note that she frequently has to use her upper extremities to pull herself in and out of the bathtub.  She reports that the shoulder pain is primarily in the anterior portions of the shoulder.  She has been using Biofreeze applied to the affected areas as well as taking Tylenol and Voltaren.  ROS: Per HPI  Allergies  Allergen Reactions  . Sulfa Antibiotics Anaphylaxis    Facial swelling   . Codeine Other (See Comments)    BLACK OUTS  . Erythromycin Swelling  . Ibuprofen Other (See Comments)    Stomach pain, muscle spasm, GI bleeding  . Penicillins Diarrhea    Has patient had a PCN reaction causing immediate rash, facial/tongue/throat swelling, SOB or lightheadedness with hypotension: No Has patient had a PCN reaction causing severe rash involving mucus membranes or skin necrosis: No Has patient had a PCN reaction that required hospitalization Unknown Has patient had a PCN reaction occurring within the last 10 years: No If all of the above answers are "NO", then may proceed with Cephalosporin use.    Past Medical History:  Diagnosis Date  . Allergy   . Anxiety   . Cataract   . Cerebellar degeneration   . Chronic kidney disease   . Chronic knee pain   . Depression   . GERD (gastroesophageal reflux disease)   . Hyperlipidemia   . Hypertension   . Thyroid disease     Current Outpatient  Medications:  .  amLODipine (NORVASC) 10 MG tablet, Take 1 tablet (10 mg total) by mouth daily. for blood pressure, Disp: 90 tablet, Rfl: 1 .  aspirin EC 81 MG tablet, Take 81 mg by mouth every morning. , Disp: , Rfl:  .  atorvastatin (LIPITOR) 40 MG tablet, Take 1 tablet (40 mg total) by mouth at bedtime., Disp: 90 tablet, Rfl: 0 .  Calcium Carbonate-Vitamin D (CALTRATE 600+D PO), Take 1 tablet by mouth daily., Disp: , Rfl:  .  cephALEXin (KEFLEX) 500 MG capsule, Take 1 capsule (500 mg total) by mouth 2 (two) times daily., Disp: 10 capsule, Rfl: 0 .  cetirizine (ZYRTEC) 10 MG chewable tablet, Chew 10 mg by mouth daily., Disp: , Rfl:  .  diclofenac (VOLTAREN) 50 MG EC tablet, Take 1 tablet (50 mg total) by mouth 3 (three) times daily., Disp: 270 tablet, Rfl: 0 .  dicyclomine (BENTYL) 10 MG capsule, Take 1 capsule (10 mg total) by mouth 3 (three) times daily as needed for spasms., Disp: 90 capsule, Rfl: 1 .  docusate sodium (COLACE) 100 MG capsule, Take 1 capsule (100 mg total) by mouth 2 (two) times daily., Disp: 60 capsule, Rfl: 0 .  DULoxetine (CYMBALTA) 60 MG capsule, Take 1 capsule (60 mg total) by mouth daily., Disp: 90 capsule, Rfl: 0 .  ferrous sulfate 325 (65 FE) MG tablet, Take 325 mg by mouth daily with breakfast. Reported on 02/29/2016,  Disp: , Rfl:  .  fluticasone (FLONASE) 50 MCG/ACT nasal spray, Place 2 sprays into both nostrils daily as needed for allergies., Disp: 48 g, Rfl: 1 .  furosemide (LASIX) 20 MG tablet, Take 1 tablet (20 mg total) by mouth daily as needed. As needed for swelling, Disp: 90 tablet, Rfl: 1 .  levothyroxine (SYNTHROID, LEVOTHROID) 50 MCG tablet, TAKE 1 TABLET BY MOUTH  DAILY BEFORE BREAKFAST, Disp: 90 tablet, Rfl: 0 .  lisinopril (PRINIVIL,ZESTRIL) 30 MG tablet, Take 1 tablet (30 mg total) by mouth daily., Disp: 90 tablet, Rfl: 0 .  Multiple Vitamin (MULTIVITAMIN WITH MINERALS) TABS tablet, Take 1 tablet by mouth daily., Disp: , Rfl:  .  omeprazole (PRILOSEC) 40  MG capsule, Take 1 capsule (40 mg total) by mouth daily., Disp: 90 capsule, Rfl: 0 .  raloxifene (EVISTA) 60 MG tablet, Take 1 tablet (60 mg total) by mouth daily., Disp: 90 tablet, Rfl: 0 Social History   Socioeconomic History  . Marital status: Widowed    Spouse name: Not on file  . Number of children: Not on file  . Years of education: Not on file  . Highest education level: Not on file  Occupational History  . Not on file  Social Needs  . Financial resource strain: Not on file  . Food insecurity:    Worry: Not on file    Inability: Not on file  . Transportation needs:    Medical: Not on file    Non-medical: Not on file  Tobacco Use  . Smoking status: Never Smoker  . Smokeless tobacco: Never Used  Substance and Sexual Activity  . Alcohol use: No  . Drug use: No  . Sexual activity: Never  Lifestyle  . Physical activity:    Days per week: Not on file    Minutes per session: Not on file  . Stress: Not on file  Relationships  . Social connections:    Talks on phone: Not on file    Gets together: Not on file    Attends religious service: Not on file    Active member of club or organization: Not on file    Attends meetings of clubs or organizations: Not on file    Relationship status: Not on file  . Intimate partner violence:    Fear of current or ex partner: Not on file    Emotionally abused: Not on file    Physically abused: Not on file    Forced sexual activity: Not on file  Other Topics Concern  . Not on file  Social History Narrative  . Not on file   Family History  Problem Relation Age of Onset  . Arthritis Sister   . Hyperlipidemia Sister   . Hypertension Sister   . Cancer Brother   . Diabetes Brother   . Heart disease Brother     Objective: Office vital signs reviewed. BP 127/76   Pulse 83   Temp (!) 97.4 F (36.3 C) (Oral)   Ht 5' 2"  (1.575 m)   BMI 36.95 kg/m   Physical Examination:  General: Awake, alert, obese, chronically ill appearing,  No acute distress; motorized wheelchair for ambulation MSK: Atrophy of the muscles in the posterior shoulders appreciated bilaterally.  Patient has reduced active range of motion in abduction and flexion by about 45 degrees bilaterally.  There are no palpable bony abnormalities.  There is no tenderness to palpation to the shoulder.  No gross joint swelling or deformity. Patient has a positive empty  can test on the right. Neuro: Light touch sensation grossly intact.  Right upper extremity strength 4-/5.  Left upper extremity strength 4+/5.    Assessment/ Plan: 77 y.o. female   1. Chronic pain of both shoulders Baseline pain likely secondary to arthritis.  However, given her physical exam today, I am concerned about possible rotator cuff pathology on the right.  We discussed that this would be best evaluated by orthopedics.  Could consider evaluating under ultrasound before proceeding with MRI.  Unsure if patient would be a very good surgical candidate.  Will place a call to her orthopedist on Monday to see if we can get an earlier appointment for her.  For now, recommended scheduling Tylenol and continue with topical analgesics.  Home exercise program provided to reduce risk of frozen shoulder.  Reasons for return evaluation discussed.  Patient voiced good understanding will follow-up as needed.   Janora Norlander, DO [place] [phone]

## 2017-12-21 ENCOUNTER — Other Ambulatory Visit: Payer: Self-pay

## 2017-12-21 DIAGNOSIS — G8929 Other chronic pain: Secondary | ICD-10-CM

## 2017-12-21 DIAGNOSIS — M25512 Pain in left shoulder: Principal | ICD-10-CM

## 2017-12-21 DIAGNOSIS — M25511 Pain in right shoulder: Principal | ICD-10-CM

## 2017-12-24 DIAGNOSIS — M19012 Primary osteoarthritis, left shoulder: Secondary | ICD-10-CM | POA: Diagnosis not present

## 2017-12-24 DIAGNOSIS — M19011 Primary osteoarthritis, right shoulder: Secondary | ICD-10-CM | POA: Diagnosis not present

## 2017-12-24 DIAGNOSIS — M7542 Impingement syndrome of left shoulder: Secondary | ICD-10-CM | POA: Diagnosis not present

## 2018-01-18 ENCOUNTER — Ambulatory Visit: Payer: Medicare Other | Admitting: Pediatrics

## 2018-01-18 ENCOUNTER — Encounter: Payer: Self-pay | Admitting: Family Medicine

## 2018-01-18 ENCOUNTER — Ambulatory Visit (INDEPENDENT_AMBULATORY_CARE_PROVIDER_SITE_OTHER): Payer: Medicare Other | Admitting: Family Medicine

## 2018-01-18 VITALS — BP 131/78 | HR 86 | Temp 97.1°F | Ht 61.0 in

## 2018-01-18 DIAGNOSIS — M170 Bilateral primary osteoarthritis of knee: Secondary | ICD-10-CM | POA: Diagnosis not present

## 2018-01-18 DIAGNOSIS — R109 Unspecified abdominal pain: Secondary | ICD-10-CM | POA: Diagnosis not present

## 2018-01-18 DIAGNOSIS — N3000 Acute cystitis without hematuria: Secondary | ICD-10-CM

## 2018-01-18 LAB — URINALYSIS, COMPLETE
Bilirubin, UA: NEGATIVE
Glucose, UA: NEGATIVE
KETONES UA: NEGATIVE
NITRITE UA: POSITIVE — AB
RBC, UA: NEGATIVE
SPEC GRAV UA: 1.02 (ref 1.005–1.030)
Urobilinogen, Ur: 0.2 mg/dL (ref 0.2–1.0)
pH, UA: 5 (ref 5.0–7.5)

## 2018-01-18 LAB — MICROSCOPIC EXAMINATION

## 2018-01-18 MED ORDER — CEPHALEXIN 250 MG PO CAPS
250.0000 mg | ORAL_CAPSULE | Freq: Four times a day (QID) | ORAL | 0 refills | Status: AC
Start: 2018-01-18 — End: 2018-01-25

## 2018-01-18 NOTE — Progress Notes (Signed)
Subjective: CC: UTI PCP: Eustaquio Maize, MD Tamara Stein is a 77 y.o. female presenting to clinic today for:  1. Urinary symptoms Patient reports a 2 day h/o right flank pain and malaise .  Denies urinary frequency, urgency, hematuria, fevers, chills, abdominal pain, nausea, vomiting, vaginal discharge.  Patient has used nothing for symptoms.  Patient denies a h/o frequent or recurrent UTIs.  She has had about 3 urinary tract infections in the last year.   ROS: Per HPI  Allergies  Allergen Reactions  . Sulfa Antibiotics Anaphylaxis    Facial swelling   . Codeine Other (See Comments)    BLACK OUTS  . Erythromycin Swelling  . Ibuprofen Other (See Comments)    Stomach pain, muscle spasm, GI bleeding  . Penicillins Diarrhea    Has patient had a PCN reaction causing immediate rash, facial/tongue/throat swelling, SOB or lightheadedness with hypotension: No Has patient had a PCN reaction causing severe rash involving mucus membranes or skin necrosis: No Has patient had a PCN reaction that required hospitalization Unknown Has patient had a PCN reaction occurring within the last 10 years: No If all of the above answers are "NO", then may proceed with Cephalosporin use.    Past Medical History:  Diagnosis Date  . Allergy   . Anxiety   . Cataract   . Cerebellar degeneration   . Chronic kidney disease   . Chronic knee pain   . Depression   . GERD (gastroesophageal reflux disease)   . Hyperlipidemia   . Hypertension   . Thyroid disease     Current Outpatient Medications:  .  amLODipine (NORVASC) 10 MG tablet, Take 1 tablet (10 mg total) by mouth daily. for blood pressure, Disp: 90 tablet, Rfl: 1 .  aspirin EC 81 MG tablet, Take 81 mg by mouth every morning. , Disp: , Rfl:  .  atorvastatin (LIPITOR) 40 MG tablet, Take 1 tablet (40 mg total) by mouth at bedtime., Disp: 90 tablet, Rfl: 0 .  Calcium Carbonate-Vitamin D (CALTRATE 600+D PO), Take 1 tablet by mouth daily.,  Disp: , Rfl:  .  cetirizine (ZYRTEC) 10 MG chewable tablet, Chew 10 mg by mouth daily., Disp: , Rfl:  .  diclofenac (VOLTAREN) 50 MG EC tablet, Take 1 tablet (50 mg total) by mouth 3 (three) times daily., Disp: 270 tablet, Rfl: 0 .  dicyclomine (BENTYL) 10 MG capsule, Take 1 capsule (10 mg total) by mouth 3 (three) times daily as needed for spasms., Disp: 90 capsule, Rfl: 1 .  docusate sodium (COLACE) 100 MG capsule, Take 1 capsule (100 mg total) by mouth 2 (two) times daily., Disp: 60 capsule, Rfl: 0 .  DULoxetine (CYMBALTA) 60 MG capsule, Take 1 capsule (60 mg total) by mouth daily., Disp: 90 capsule, Rfl: 0 .  ferrous sulfate 325 (65 FE) MG tablet, Take 325 mg by mouth daily with breakfast. Reported on 02/29/2016, Disp: , Rfl:  .  fluticasone (FLONASE) 50 MCG/ACT nasal spray, Place 2 sprays into both nostrils daily as needed for allergies., Disp: 48 g, Rfl: 1 .  furosemide (LASIX) 20 MG tablet, Take 1 tablet (20 mg total) by mouth daily as needed. As needed for swelling, Disp: 90 tablet, Rfl: 1 .  levothyroxine (SYNTHROID, LEVOTHROID) 50 MCG tablet, TAKE 1 TABLET BY MOUTH  DAILY BEFORE BREAKFAST, Disp: 90 tablet, Rfl: 0 .  lisinopril (PRINIVIL,ZESTRIL) 30 MG tablet, Take 1 tablet (30 mg total) by mouth daily., Disp: 90 tablet, Rfl: 0 .  Multiple Vitamin (  MULTIVITAMIN WITH MINERALS) TABS tablet, Take 1 tablet by mouth daily., Disp: , Rfl:  .  omeprazole (PRILOSEC) 40 MG capsule, Take 1 capsule (40 mg total) by mouth daily., Disp: 90 capsule, Rfl: 0 .  raloxifene (EVISTA) 60 MG tablet, Take 1 tablet (60 mg total) by mouth daily., Disp: 90 tablet, Rfl: 0 Social History   Socioeconomic History  . Marital status: Widowed    Spouse name: Not on file  . Number of children: Not on file  . Years of education: Not on file  . Highest education level: Not on file  Occupational History  . Not on file  Social Needs  . Financial resource strain: Not on file  . Food insecurity:    Worry: Not on file     Inability: Not on file  . Transportation needs:    Medical: Not on file    Non-medical: Not on file  Tobacco Use  . Smoking status: Never Smoker  . Smokeless tobacco: Never Used  Substance and Sexual Activity  . Alcohol use: No  . Drug use: No  . Sexual activity: Never  Lifestyle  . Physical activity:    Days per week: Not on file    Minutes per session: Not on file  . Stress: Not on file  Relationships  . Social connections:    Talks on phone: Not on file    Gets together: Not on file    Attends religious service: Not on file    Active member of club or organization: Not on file    Attends meetings of clubs or organizations: Not on file    Relationship status: Not on file  . Intimate partner violence:    Fear of current or ex partner: Not on file    Emotionally abused: Not on file    Physically abused: Not on file    Forced sexual activity: Not on file  Other Topics Concern  . Not on file  Social History Narrative  . Not on file   Family History  Problem Relation Age of Onset  . Arthritis Sister   . Hyperlipidemia Sister   . Hypertension Sister   . Cancer Brother   . Diabetes Brother   . Heart disease Brother     Objective: Office vital signs reviewed. BP 131/78   Pulse 86   Temp (!) 97.1 F (36.2 C) (Oral)   Ht 5' 1"  (1.549 m)   BMI 38.17 kg/m   Physical Examination:  General: Awake, alert, obese, No acute distress GU: No suprapubic tenderness to palpation. MSK: Mild right-sided lower back tenderness to palpation.  No overt CVA tenderness to palpation.  Assessment/ Plan: 77 y.o. female   1. Acute cystitis without hematuria Urinalysis with 1+ protein, 1+ leukocytes and nitrite positive.  Urine microscopy with 11-30 white blood cells and many bacteria.  She does not have overt CVA tenderness to palpation but I am concerned about this potentially being an early pyelonephritis.  For this reason, I have put her on Keflex 250 mg 4 times daily for the next 7  days.  I reviewed her last urine culture from May 2019 which demonstrated E. coli and Proteus, both which were sensitive to cephalosporins.  Additionally, she has been prescribed cephalosporins in the past and tolerated them without difficulty.  Urine culture ordered.  Will contact patient with results.  Home care instructions reviewed.  Push oral fluids.  Reasons for emergent evaluation emergency department discussed.  Patient was good understanding will follow-up  as needed. - Urinalysis, Complete - Urine Culture   Orders Placed This Encounter  Procedures  . Urine Culture  . Urinalysis, Complete   Meds ordered this encounter  Medications  . cephALEXin (KEFLEX) 250 MG capsule    Sig: Take 1 capsule (250 mg total) by mouth 4 (four) times daily for 7 days.    Dispense:  28 capsule    Refill:  [place], DO [place] [phone]

## 2018-01-18 NOTE — Patient Instructions (Signed)
I prescribed you cephalexin to take 4 times a day for the next 7 days to cover for any penetration of the urinary tract infection to your kidney.  I have also ordered a urine culture and will contact you with results.  I did review your previous urine culture from May which grew both E. coli and Proteus that were both sensitive to the current antibiotic I am prescribing you.  If your symptoms worsen, you develop fevers, nausea, vomiting, inability to stay hydrated, please seek immediate medical attention the emergency department.   Urinary Tract Infection, Adult A urinary tract infection (UTI) is an infection of any part of the urinary tract. The urinary tract includes the:  Kidneys.  Ureters.  Bladder.  Urethra.  These organs make, store, and get rid of pee (urine) in the body. Follow these instructions at home:  Take over-the-counter and prescription medicines only as told by your doctor.  If you were prescribed an antibiotic medicine, take it as told by your doctor. Do not stop taking the antibiotic even if you start to feel better.  Avoid the following drinks: ? Alcohol. ? Caffeine. ? Tea. ? Carbonated drinks.  Drink enough fluid to keep your pee clear or pale yellow.  Keep all follow-up visits as told by your doctor. This is important.  Make sure to: ? Empty your bladder often and completely. Do not to hold pee for long periods of time. ? Empty your bladder before and after sex. ? Wipe from front to back after a bowel movement if you are female. Use each tissue one time when you wipe. Contact a doctor if:  You have back pain.  You have a fever.  You feel sick to your stomach (nauseous).  You throw up (vomit).  Your symptoms do not get better after 3 days.  Your symptoms go away and then come back. Get help right away if:  You have very bad back pain.  You have very bad lower belly (abdominal) pain.  You are throwing up and cannot keep down any medicines or  water. This information is not intended to replace advice given to you by your health care provider. Make sure you discuss any questions you have with your health care provider. Document Released: 01/14/2008 Document Revised: 01/03/2016 Document Reviewed: 06/18/2015 Elsevier Interactive Patient Education  [employer].

## 2018-01-20 ENCOUNTER — Other Ambulatory Visit: Payer: Self-pay | Admitting: Pediatrics

## 2018-01-20 DIAGNOSIS — F419 Anxiety disorder, unspecified: Secondary | ICD-10-CM

## 2018-01-20 DIAGNOSIS — E039 Hypothyroidism, unspecified: Secondary | ICD-10-CM

## 2018-01-20 DIAGNOSIS — K219 Gastro-esophageal reflux disease without esophagitis: Secondary | ICD-10-CM

## 2018-01-20 DIAGNOSIS — I10 Essential (primary) hypertension: Secondary | ICD-10-CM

## 2018-01-20 DIAGNOSIS — M1990 Unspecified osteoarthritis, unspecified site: Secondary | ICD-10-CM

## 2018-01-21 LAB — URINE CULTURE

## 2018-01-21 MED ORDER — DICLOFENAC SODIUM 50 MG PO TBEC: 50.0000 mg | DELAYED_RELEASE_TABLET | Freq: Three times a day (TID) | ORAL | 0 refills | Status: DC

## 2018-02-17 ENCOUNTER — Encounter: Payer: Self-pay | Admitting: Family

## 2018-02-17 ENCOUNTER — Ambulatory Visit (INDEPENDENT_AMBULATORY_CARE_PROVIDER_SITE_OTHER): Payer: Medicare Other | Admitting: Family

## 2018-02-17 VITALS — BP 126/72 | HR 81 | Temp 97.4°F

## 2018-02-17 DIAGNOSIS — N3001 Acute cystitis with hematuria: Secondary | ICD-10-CM

## 2018-02-17 DIAGNOSIS — R309 Painful micturition, unspecified: Secondary | ICD-10-CM | POA: Diagnosis not present

## 2018-02-17 LAB — MICROSCOPIC EXAMINATION: RENAL EPITHEL UA: NONE SEEN /HPF

## 2018-02-17 LAB — URINALYSIS, COMPLETE
Bilirubin, UA: NEGATIVE
Glucose, UA: NEGATIVE
Ketones, UA: NEGATIVE
Nitrite, UA: POSITIVE — AB
PH UA: 5 (ref 5.0–7.5)
Specific Gravity, UA: 1.02 (ref 1.005–1.030)
Urobilinogen, Ur: 0.2 mg/dL (ref 0.2–1.0)

## 2018-02-17 MED ORDER — NITROFURANTOIN MONOHYD MACRO 100 MG PO CAPS: 100.0000 mg | ORAL_CAPSULE | Freq: Two times a day (BID) | ORAL | 0 refills | Status: DC

## 2018-02-17 NOTE — Progress Notes (Signed)
   Subjective:    Patient ID: Tamara Stein, female    DOB: 18-Dec-1940, 77 y.o.   MRN: [id number]  No chief complaint on file.   Dysuria   The current episode started yesterday. The problem occurs every urination. The problem has been waxing and waning. The quality of the pain is described as burning. The pain is mild. Associated symptoms include frequency and urgency. Pertinent negatives include no flank pain, hematuria, hesitancy, nausea or vomiting. She has tried increased fluids for the symptoms. The treatment provided mild relief.      Review of Systems  Gastrointestinal: Negative for nausea and vomiting.  Genitourinary: Positive for dysuria, frequency and urgency. Negative for flank pain, hematuria and hesitancy.  All other systems reviewed and are negative.      Objective:   Physical Exam  Constitutional: She is oriented to person, place, and time. She appears well-developed and well-nourished. No distress.  HENT:  Head: Normocephalic and atraumatic.  Eyes: Pupils are equal, round, and reactive to light.  Neck: Normal range of motion. Neck supple. No thyromegaly present.  Cardiovascular: Normal rate, regular rhythm, normal heart sounds and intact distal pulses.  No murmur heard. Pulmonary/Chest: Effort normal and breath sounds normal. No respiratory distress. She has no wheezes.  Abdominal: Soft. Bowel sounds are normal. She exhibits no distension. There is no tenderness.  Musculoskeletal: She exhibits edema (trace BLE). She exhibits no tenderness.  Pt in wheelchair  Neurological: She is alert and oriented to person, place, and time. She has normal reflexes. No cranial nerve deficit.  Skin: Skin is warm and dry.  Psychiatric: She has a normal mood and affect. Her behavior is normal. Judgment and thought content normal.  Vitals reviewed.     BP 126/72   Pulse 81   Temp (!) 97.4 F (36.3 C) (Oral)      Assessment & Plan:  Tyjanae was seen today for  dysuria.  Diagnoses and all orders for this visit:  Pain passing urine -     Urinalysis, Complete  Acute cystitis with hematuria -     Urine Culture -     nitrofurantoin, macrocrystal-monohydrate, (MACROBID) 100 MG capsule; Take 1 capsule (100 mg total) by mouth 2 (two) times daily.   Force fluids RTO prn Culture pending   Evelina Dun, FNP

## 2018-02-17 NOTE — Patient Instructions (Signed)

## 2018-02-19 ENCOUNTER — Other Ambulatory Visit: Payer: Self-pay | Admitting: Pediatrics

## 2018-02-19 ENCOUNTER — Telehealth: Payer: Self-pay | Admitting: Pediatrics

## 2018-02-19 DIAGNOSIS — N3090 Cystitis, unspecified without hematuria: Secondary | ICD-10-CM

## 2018-02-19 LAB — URINE CULTURE

## 2018-02-19 MED ORDER — DOXYCYCLINE HYCLATE 100 MG PO TABS: 100.0000 mg | ORAL_TABLET | Freq: Two times a day (BID) | ORAL | 0 refills | Status: DC

## 2018-02-19 NOTE — Telephone Encounter (Signed)
called and talked with patient.

## 2018-02-19 NOTE — Telephone Encounter (Signed)
Patient called and stated that [facility] called stated that patient needs colonoscopy.  Patient states that she does not think she needs one but wanted your opinion

## 2018-02-19 NOTE — Telephone Encounter (Signed)
Returned call.  Someone called her telling her she was due for 5-year repeat colonoscopy.  Patient with history of cerebellar degeneration, is wheelchair-bound.  Last colonoscopy was in 2014.  Per chart review she did have a small polyp, 0.3 mm, was tubular adenoma.  She had complications immediately following the colonoscopy, had to be intubated, was hospitalized treated for pneumonia, possible aspiration.   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  Hemoglobin at time of colonoscopy was 9.7.  2 months ago hemoglobin was 10.8.  Also with CKD stage III.    Patient does not want to have another colonoscopy, she is not having any symptoms right now.  I think this is reasonable given the risk of additional procedure.  Separate problem, she was seen 2 days ago for dysuria.  She was treated with nitrofurantoin.  She is still having dysuria.  Reviewed recent urine culture, resistant to nitrofurantoin.  Stop the nitrofurantoin.  Sent in doxycycline.  She does not remember having any previous reactions to this medicine.

## 2018-02-19 NOTE — Addendum Note (Signed)
Addended by: Eustaquio Maize on: 02/19/2018 11:22 AM   Modules accepted: Orders

## 2018-02-23 ENCOUNTER — Telehealth: Payer: Self-pay | Admitting: Pediatrics

## 2018-02-23 ENCOUNTER — Other Ambulatory Visit: Payer: Self-pay | Admitting: Family

## 2018-02-23 MED ORDER — CIPROFLOXACIN HCL 500 MG PO TABS: 500.0000 mg | ORAL_TABLET | Freq: Two times a day (BID) | ORAL | 0 refills | Status: DC

## 2018-02-23 NOTE — Telephone Encounter (Signed)
Patient notified to continue doxy per lab result

## 2018-03-05 ENCOUNTER — Encounter (HOSPITAL_COMMUNITY): Payer: Self-pay

## 2018-03-05 ENCOUNTER — Emergency Department (HOSPITAL_COMMUNITY)
Admission: EM | Admit: 2018-03-05 | Discharge: 2018-03-05 | Disposition: A | Discharge status: Home / Self Care | Payer: Medicare Other | Attending: Emergency Medicine | Admitting: Emergency Medicine

## 2018-03-05 DIAGNOSIS — R531 Weakness: Secondary | ICD-10-CM | POA: Diagnosis not present

## 2018-03-05 DIAGNOSIS — Z79899 Other long term (current) drug therapy: Secondary | ICD-10-CM | POA: Diagnosis not present

## 2018-03-05 DIAGNOSIS — N183 Chronic kidney disease, stage 3 (moderate): Secondary | ICD-10-CM | POA: Insufficient documentation

## 2018-03-05 DIAGNOSIS — R112 Nausea with vomiting, unspecified: Secondary | ICD-10-CM | POA: Insufficient documentation

## 2018-03-05 DIAGNOSIS — R109 Unspecified abdominal pain: Secondary | ICD-10-CM | POA: Insufficient documentation

## 2018-03-05 DIAGNOSIS — R197 Diarrhea, unspecified: Secondary | ICD-10-CM | POA: Insufficient documentation

## 2018-03-05 DIAGNOSIS — I129 Hypertensive chronic kidney disease with stage 1 through stage 4 chronic kidney disease, or unspecified chronic kidney disease: Secondary | ICD-10-CM | POA: Insufficient documentation

## 2018-03-05 DIAGNOSIS — Z7982 Long term (current) use of aspirin: Secondary | ICD-10-CM | POA: Diagnosis not present

## 2018-03-05 DIAGNOSIS — R404 Transient alteration of awareness: Secondary | ICD-10-CM | POA: Diagnosis not present

## 2018-03-05 DIAGNOSIS — E039 Hypothyroidism, unspecified: Secondary | ICD-10-CM | POA: Diagnosis not present

## 2018-03-05 DIAGNOSIS — R279 Unspecified lack of coordination: Secondary | ICD-10-CM | POA: Diagnosis not present

## 2018-03-05 DIAGNOSIS — Z7401 Bed confinement status: Secondary | ICD-10-CM | POA: Diagnosis not present

## 2018-03-05 LAB — COMPREHENSIVE METABOLIC PANEL
ALBUMIN: 3.4 g/dL — AB (ref 3.5–5.0)
ALK PHOS: 53 U/L (ref 38–126)
ALT: 25 U/L (ref 0–44)
ANION GAP: 5 (ref 5–15)
AST: 22 U/L (ref 15–41)
BILIRUBIN TOTAL: 0.5 mg/dL (ref 0.3–1.2)
BUN: 32 mg/dL — ABNORMAL HIGH (ref 8–23)
CHLORIDE: 112 mmol/L — AB (ref 98–111)
CO2: 25 mmol/L (ref 22–32)
Calcium: 8.8 mg/dL — ABNORMAL LOW (ref 8.9–10.3)
Creatinine, Ser: 1.37 mg/dL — ABNORMAL HIGH (ref 0.44–1.00)
GFR calc Af Amer: 42 mL/min — ABNORMAL LOW (ref >60)
GFR calc non Af Amer: 36 mL/min — ABNORMAL LOW (ref >60)
GLUCOSE: 131 mg/dL — AB (ref 70–99)
Potassium: 4 mmol/L (ref 3.5–5.1)
SODIUM: 142 mmol/L (ref 135–145)
Total Protein: 6.3 g/dL — ABNORMAL LOW (ref 6.5–8.1)

## 2018-03-05 LAB — CBC
HCT: 35.4 % — ABNORMAL LOW (ref 36.0–46.0)
HEMOGLOBIN: 11.2 g/dL — AB (ref 12.0–15.0)
MCH: 30.9 pg (ref 26.0–34.0)
MCHC: 31.6 g/dL (ref 30.0–36.0)
MCV: 97.8 fL (ref 78.0–100.0)
Platelets: 205 10*3/uL (ref 150–400)
RBC: 3.62 MIL/uL — AB (ref 3.87–5.11)
RDW: 14.7 % (ref 11.5–15.5)
WBC: 8.3 10*3/uL (ref 4.0–10.5)

## 2018-03-05 LAB — URINALYSIS, ROUTINE W REFLEX MICROSCOPIC
Bilirubin Urine: NEGATIVE
Glucose, UA: NEGATIVE mg/dL
Hgb urine dipstick: NEGATIVE
KETONES UR: NEGATIVE mg/dL
Leukocytes, UA: NEGATIVE
Nitrite: POSITIVE — AB
PROTEIN: NEGATIVE mg/dL
Specific Gravity, Urine: 1.009 (ref 1.005–1.030)
pH: 5 (ref 5.0–8.0)

## 2018-03-05 LAB — LIPASE, BLOOD: Lipase: 37 U/L (ref 11–51)

## 2018-03-05 MED ORDER — SODIUM CHLORIDE 0.9 % IV BOLUS
1000.0000 mL | Freq: Once | INTRAVENOUS | Status: AC
  Administered 2018-03-05: 1000 mL via INTRAVENOUS

## 2018-03-05 MED ORDER — ONDANSETRON HCL 4 MG PO TABS: 4.0000 mg | ORAL_TABLET | Freq: Four times a day (QID) | ORAL | 0 refills | Status: DC

## 2018-03-05 MED ORDER — ONDANSETRON HCL 4 MG/2ML IJ SOLN
4.0000 mg | Freq: Once | INTRAMUSCULAR | Status: AC
  Administered 2018-03-05: 4 mg via INTRAVENOUS
  Filled 2018-03-05: qty 2

## 2018-03-05 NOTE — ED Triage Notes (Signed)
Pt brought in by EMS due to diarrhea and abdominal pain. Pt states she was fine this am. Diarrhea and pain started approx an hour ago. Pt reports multiple loose stools. Pt reports she completed antibiotics for urinary tract infection recently

## 2018-03-05 NOTE — ED Notes (Signed)
[facility] called at this time for transport.

## 2018-03-05 NOTE — ED Provider Notes (Signed)
[facility] EMERGENCY DEPARTMENT Provider Note   CSN: [id number] Arrival date & time: 03/05/18  1420     History   Chief Complaint Chief Complaint  Patient presents with  . Abdominal Pain  . Diarrhea    HPI Tamara Stein is a 77 y.o. female.  Level 5 caveat for acuity of condition.  Abrupt onset of nausea, vomiting, diarrhea and abdominal cramping just prior to emergency visit.  Patient is bedridden and lives with her sister and brother-in-law.  She was feeling normal this morning.  She just completed antibiotics for urinary tract infection.  No blood or mucus in her stool.  No fever, sweats, chills, chest pain, dyspnea, dysuria.  Severity is moderate.     Past Medical History:  Diagnosis Date  . Allergy   . Anxiety   . Cataract   . Cerebellar degeneration   . Chronic kidney disease   . Chronic knee pain   . Depression   . GERD (gastroesophageal reflux disease)   . Hyperlipidemia   . Hypertension   . Thyroid disease     Patient Active Problem List   Diagnosis Date Noted  . Internal impingement of left shoulder 12/24/2017  . Anxiety 04/20/2017  . 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 Incontinence of urine 04/20/2017  . UTI (urinary tract infection) 07/15/2016  . Abdominal pain 07/15/2016  . Colitis 07/15/2016  . Syncope and collapse 04/26/2016  . Lower urinary tract infectious disease 04/26/2016  . Fall   . Daytime somnolence 03/10/2016  . Diplopia 03/10/2016  . Insomnia 03/10/2016  . Sleep apnea in adult 03/10/2016  . Imbalance 03/07/2016  . Migraine 03/07/2016  . Tremor 03/07/2016  . Pain of both shoulder joints 02/29/2016  . Arthritis 09/24/2015  . Constipation 06/05/2015  . Depression 06/05/2015  . Hypothyroid 06/05/2015  . 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 GERD (gastroesophageal reflux disease)   . Essential hypertension with goal blood pressure less than 140/90   . Hyperlipidemia   . Cerebellar degeneration   . CKD (chronic kidney  disease) stage 3, GFR 30-59 ml/min (HCC) 12/02/2013    Past Surgical History:  Procedure Laterality Date  . ABDOMINAL HYSTERECTOMY    . CHOLECYSTECTOMY    . MOUTH SURGERY    . RIGHT ELBOW       OB History   None      Home Medications    Prior to Admission medications   Medication Sig Start Date End Date Taking? Authorizing Provider  amLODipine (NORVASC) 10 MG tablet Take 1 tablet (10 mg total) by mouth daily. for blood pressure 10/20/17  Yes Eustaquio Maize, MD  aspirin EC 81 MG tablet Take 81 mg by mouth every morning.    Yes [provider]  atorvastatin (LIPITOR) 40 MG tablet Take 1 tablet (40 mg total) by mouth at bedtime. 10/20/17  Yes Eustaquio Maize, MD  Calcium Carbonate-Vitamin D (CALTRATE 600+D PO) Take 1 tablet by mouth daily.   Yes [provider]  cefixime (SUPRAX) 400 MG CAPS capsule Take 400 mg by mouth daily.   Yes [provider]  cetirizine (ZYRTEC) 10 MG chewable tablet Chew 10 mg by mouth daily.   Yes [provider]  diclofenac (VOLTAREN) 50 MG EC tablet Take 1 tablet (50 mg total) by mouth 3 (three) times daily. 01/21/18  Yes Eustaquio Maize, MD  doxycycline (VIBRA-TABS) 100 MG tablet Take 1 tablet (100 mg total) by mouth 2 (two) times daily. 02/19/18  Yes Eustaquio Maize, MD  DULoxetine (CYMBALTA) 60 MG capsule TAKE 1 CAPSULE BY MOUTH DAILY 01/21/18  Yes Eustaquio Maize, MD  ferrous sulfate 325 (65 FE) MG tablet Take 325 mg by mouth daily with breakfast. Reported on 02/29/2016   Yes [provider]  fluticasone (FLONASE) 50 MCG/ACT nasal spray Place 2 sprays into both nostrils daily as needed for allergies. 10/20/17  Yes Eustaquio Maize, MD  levothyroxine (SYNTHROID, LEVOTHROID) 50 MCG tablet TAKE 1 TABLET BY MOUTH DAILY BEFORE BREAKFAST 01/21/18  Yes Eustaquio Maize, MD  lisinopril (PRINIVIL,ZESTRIL) 30 MG tablet TAKE 1 TABLET BY MOUTH DAILY 01/21/18  Yes Eustaquio Maize, MD  meclizine (ANTIVERT) 25 MG tablet Take  25 mg by mouth 3 (three) times daily as needed for dizziness.   Yes [provider]  Multiple Vitamin (MULTIVITAMIN WITH MINERALS) TABS tablet Take 1 tablet by mouth daily.   Yes [provider]  omeprazole (PRILOSEC) 40 MG capsule TAKE 1 CAPSULE BY MOUTH DAILY 01/21/18  Yes Eustaquio Maize, MD  raloxifene (EVISTA) 60 MG tablet TAKE 1 TABLET BY MOUTH DAILY 01/21/18  Yes Eustaquio Maize, MD  traMADol (ULTRAM) 50 MG tablet Take 50 mg by mouth every 4 (four) hours as needed for moderate pain.   Yes [provider]  dicyclomine (BENTYL) 10 MG capsule Take 1 capsule (10 mg total) by mouth 3 (three) times daily as needed for spasms. 10/14/17   Eustaquio Maize, MD  docusate sodium (COLACE) 100 MG capsule Take 1 capsule (100 mg total) by mouth 2 (two) times daily. 04/20/17   Eustaquio Maize, MD  furosemide (LASIX) 20 MG tablet Take 1 tablet (20 mg total) by mouth daily as needed. As needed for swelling 08/25/17   Eustaquio Maize, MD  ondansetron (ZOFRAN) 4 MG tablet Take 1 tablet (4 mg total) by mouth every 6 (six) hours. 03/05/18   Nat Christen, MD    Family History Family History  Problem Relation Age of Onset  . Arthritis Sister   . Hyperlipidemia Sister   . Hypertension Sister   . Cancer Brother   . Diabetes Brother   . Heart disease Brother     Social History Social History   Tobacco Use  . Smoking status: Never Smoker  . Smokeless tobacco: Never Used  Substance Use Topics  . Alcohol use: No  . Drug use: No     Allergies   Sulfa antibiotics; Codeine; Erythromycin; Ibuprofen; and Penicillins   Review of Systems Review of Systems  Unable to perform ROS: Acuity of condition     Physical Exam Updated Vital Signs BP (!) 149/81   Pulse 99   Temp (!) 97.5 F (36.4 C) (Oral)   Resp 20   Wt 91.6 kg (202 lb)   SpO2 98%   BMI 38.17 kg/m   Physical Exam  Constitutional: She is oriented to person, place, and time.  Pale, no active vomiting or  diarrhea.  HENT:  Head: Normocephalic and atraumatic.  Eyes: Conjunctivae are normal.  Neck: Neck supple.  Cardiovascular: Normal rate and regular rhythm.  Pulmonary/Chest: Effort normal and breath sounds normal.  Abdominal: Soft. Bowel sounds are normal.  Musculoskeletal: Normal range of motion.  Neurological: She is alert and oriented to person, place, and time.  Skin: Skin is warm and dry.  Psychiatric: She has a normal mood and affect. Her behavior is normal.  Nursing note and vitals reviewed.    ED Treatments / Results  Labs (all labs ordered are listed,  but only abnormal results are displayed) Labs Reviewed  COMPREHENSIVE METABOLIC PANEL - Abnormal; Notable for the following components:      Result Value   Chloride 112 (*)    Glucose, Bld 131 (*)    BUN 32 (*)    Creatinine, Ser 1.37 (*)    Calcium 8.8 (*)    Total Protein 6.3 (*)    Albumin 3.4 (*)    GFR calc non Af Amer 36 (*)    GFR calc Af Amer 42 (*)    All other components within normal limits  CBC - Abnormal; Notable for the following components:   RBC 3.62 (*)    Hemoglobin 11.2 (*)    HCT 35.4 (*)    All other components within normal limits  URINALYSIS, ROUTINE W REFLEX MICROSCOPIC - Abnormal; Notable for the following components:   APPearance HAZY (*)    Nitrite POSITIVE (*)    Bacteria, UA RARE (*)    All other components within normal limits  LIPASE, BLOOD    EKG None  Radiology No results found.  Procedures Procedures (including critical care time)  Medications Ordered in ED Medications  ondansetron (ZOFRAN) injection 4 mg (4 mg Intravenous Given 03/05/18 1617)  sodium chloride 0.9 % bolus 1,000 mL (0 mLs Intravenous Stopped 03/05/18 1732)  sodium chloride 0.9 % bolus 1,000 mL (0 mLs Intravenous Stopped 03/05/18 1905)     Initial Impression / Assessment and Plan / ED Course  I have reviewed the triage vital signs and the nursing notes.  Pertinent labs & imaging results that were  available during my care of the patient were reviewed by me and considered in my medical decision making (see chart for details).     Patient presents with abrupt onset of nausea, vomiting, diarrhea.  Her vital signs are stable.  She feels much better after IV fluids, IV Zofran.  No acute abdomen at discharge.  Discharge medications Zofran 4 mg.  Final Clinical Impressions(s) / ED Diagnoses   Final diagnoses:  Nausea vomiting and diarrhea    ED Discharge Orders        Ordered    ondansetron (ZOFRAN) 4 MG tablet  Every 6 hours     03/05/18 1932       Nat Christen, MD 03/05/18 2012

## 2018-03-05 NOTE — Discharge Instructions (Addendum)
Blood work was good.  Prescription for nausea medicine.  Increase fluids.

## 2018-03-31 ENCOUNTER — Ambulatory Visit (INDEPENDENT_AMBULATORY_CARE_PROVIDER_SITE_OTHER): Payer: Medicare Other | Admitting: Pediatrics

## 2018-03-31 ENCOUNTER — Encounter: Payer: Self-pay | Admitting: Pediatrics

## 2018-03-31 VITALS — BP 124/82 | HR 89 | Temp 98.0°F | Ht 61.0 in

## 2018-03-31 DIAGNOSIS — E039 Hypothyroidism, unspecified: Secondary | ICD-10-CM

## 2018-03-31 DIAGNOSIS — R109 Unspecified abdominal pain: Secondary | ICD-10-CM | POA: Diagnosis not present

## 2018-03-31 DIAGNOSIS — R197 Diarrhea, unspecified: Secondary | ICD-10-CM | POA: Diagnosis not present

## 2018-03-31 DIAGNOSIS — K5900 Constipation, unspecified: Secondary | ICD-10-CM

## 2018-03-31 NOTE — Progress Notes (Signed)
  Subjective:   Patient ID: Tamara Stein, female    DOB: 13-Nov-1940, 77 y.o.   MRN: [id number] CC: diarrhea and constipation HPI: Tamara Stein is a 77 y.o. female   Alternates between diarrhea and constipation every few days.  She continues to have intermittent episodes of sudden ""explosive" loose stools out of the blue that she is not able to control.  She is not able to say how often these happen, she does not think every week.  She does not think it happens right after eating, sometimes can be a couple hours after eating.  She is not sure if it comes on on the days that she takes MiraLAX or not.  Other day she goes several days in between having bowel she has to take the MiraLAX in order to start having stools.  Stomach hurts right before the diarrhea comes.  She does not typically have abdominal pain.  Appetite is been fine.  She does not eat a lot of fiber in her diet.  Not drinking a lot of fluid daily.  Relevant past medical, surgical, family and social history reviewed. Allergies and medications reviewed and updated. Social History   Tobacco Use  Smoking Status Never Smoker  Smokeless Tobacco Never Used   ROS: Per HPI   Objective:    BP 124/82   Pulse 89   Temp 98 F (36.7 C) (Oral)   Ht 5' 1"  (1.549 m)   BMI 38.17 kg/m   Wt Readings from Last 3 Encounters:  03/05/18 202 lb (91.6 kg)  08/16/16 202 lb (91.6 kg)  07/01/16 202 lb (91.6 kg)   Gen: NAD, alert, cooperative with exam, NCAT.  In wheelchair. EYES: EOMI, no conjunctival injection, or no icterus ENT:   OP without erythema LYMPH: no cervical LAD CV: NRRR, normal S1/S2, no murmur, distal pulses 2+ b/l Resp: CTABL, no wheezes, normal WOB Abd: +BS, soft, NTND. no guarding or organomegaly Ext: No edema, warm Neuro: Alert and oriented MSK: normal muscle bulk  Assessment & Plan:   77 year old female here with intermittent diarrhea, constipation abdominal pain.  Patient is going to keep track of her  stooling and takes MiraLAX.  If no stool within 24 hours, okay to try the MiraLAX.  Increase fiber in diet.  Handout given.  Any fevers, blood in stool, worsening symptoms needs to be seen.  May need to get back into see GI.  Diagnoses and all orders for this visit:  Abdominal pain, unspecified abdominal location  Constipation, unspecified constipation type  Diarrhea, unspecified type  Hypothyroidism, unspecified type -     TSH   Follow up plan: 4 weeks Tamara Found, MD [place]

## 2018-03-31 NOTE — Patient Instructions (Addendum)
Increase fiber in daily diet  Keep track of days you have abdominal pain/loose stools and when you take the miralax   High-Fiber Diet Fiber, also called dietary fiber, is a type of carbohydrate found in fruits, vegetables, whole grains, and beans. A high-fiber diet can have many health benefits. Your health care provider may recommend a high-fiber diet to help:  Prevent constipation. Fiber can make your bowel movements more regular.  Lower your cholesterol.  Relieve hemorrhoids, uncomplicated diverticulosis, or irritable bowel syndrome.  Prevent overeating as part of a weight-loss plan.  Prevent heart disease, type 2 diabetes, and certain cancers.  What is my plan? The recommended daily intake of fiber includes:  38 grams for men under age 16.  3 grams for men over age 55.  81 grams for women under age 75.  32 grams for women over age 88.  You can get the recommended daily intake of dietary fiber by eating a variety of fruits, vegetables, grains, and beans. Your health care provider may also recommend a fiber supplement if it is not possible to get enough fiber through your diet. What do I need to know about a high-fiber diet?  Fiber supplements have not been widely studied for their effectiveness, so it is better to get fiber through food sources.  Always check the fiber content on thenutrition facts label of any prepackaged food. Look for foods that contain at least 5 grams of fiber per serving.  Ask your dietitian if you have questions about specific foods that are related to your condition, especially if those foods are not listed in the following section.  Increase your daily fiber consumption gradually. Increasing your intake of dietary fiber too quickly may cause bloating, cramping, or gas.  Drink plenty of water. Water helps you to digest fiber. What foods can I eat? Grains Whole-grain breads. Multigrain cereal. Oats and oatmeal. Brown rice. Barley. Bulgur wheat.  [place]. Bran muffins. Popcorn. Rye wafer crackers. Vegetables Sweet potatoes. Spinach. Kale. Artichokes. Cabbage. Broccoli. Green peas. Carrots. Squash. Fruits Berries. Pears. Apples. Oranges. Avocados. Prunes and raisins. Dried figs. Meats and Other Protein Sources [place], kidney, pinto, and soy beans. Split peas. Lentils. Nuts and seeds. Dairy Fiber-fortified yogurt. Beverages Fiber-fortified soy milk. Fiber-fortified orange juice. Other Fiber bars. The items listed above may not be a complete list of recommended foods or beverages. Contact your dietitian for more options. What foods are not recommended? Grains White bread. Pasta made with refined flour. White rice. Vegetables Fried potatoes. Canned vegetables. Well-cooked vegetables. Fruits Fruit juice. Cooked, strained fruit. Meats and Other Protein Sources Fatty cuts of meat. Fried Sales executive or fried fish. Dairy Milk. Yogurt. Cream cheese. Sour cream. Beverages Soft drinks. Other Cakes and pastries. Butter and oils. The items listed above may not be a complete list of foods and beverages to avoid. Contact your dietitian for more information. What are some tips for including high-fiber foods in my diet?  Eat a wide variety of high-fiber foods.  Make sure that half of all grains consumed each day are whole grains.  Replace breads and cereals made from refined flour or white flour with whole-grain breads and cereals.  Replace white rice with brown rice, bulgur wheat, or millet.  Start the day with a breakfast that is high in fiber, such as a cereal that contains at least 5 grams of fiber per serving.  Use beans in place of meat in soups, salads, or pasta.  Eat high-fiber snacks, such as berries, raw vegetables, nuts,  or popcorn. This information is not intended to replace advice given to you by your health care provider. Make sure you discuss any questions you have with your health care provider. Document Released:  07/28/2005 Document Revised: 01/03/2016 Document Reviewed: 01/10/2014 Elsevier Interactive Patient Education  [employer].

## 2018-04-01 LAB — TSH: TSH: 4.37 u[IU]/mL (ref 0.450–4.500)

## 2018-04-03 ENCOUNTER — Encounter: Payer: Self-pay | Admitting: Pediatrics

## 2018-04-06 DIAGNOSIS — M25512 Pain in left shoulder: Secondary | ICD-10-CM | POA: Diagnosis not present

## 2018-04-06 DIAGNOSIS — M25511 Pain in right shoulder: Secondary | ICD-10-CM | POA: Diagnosis not present

## 2018-04-20 DIAGNOSIS — Z1231 Encounter for screening mammogram for malignant neoplasm of breast: Secondary | ICD-10-CM | POA: Diagnosis not present

## 2018-04-20 LAB — HM MAMMOGRAPHY

## 2018-04-28 ENCOUNTER — Other Ambulatory Visit: Payer: Self-pay | Admitting: Pediatrics

## 2018-04-28 DIAGNOSIS — F419 Anxiety disorder, unspecified: Secondary | ICD-10-CM

## 2018-04-28 DIAGNOSIS — K219 Gastro-esophageal reflux disease without esophagitis: Secondary | ICD-10-CM

## 2018-04-28 DIAGNOSIS — E039 Hypothyroidism, unspecified: Secondary | ICD-10-CM

## 2018-04-28 DIAGNOSIS — I10 Essential (primary) hypertension: Secondary | ICD-10-CM

## 2018-05-12 ENCOUNTER — Telehealth: Payer: Self-pay | Admitting: Pediatrics

## 2018-05-12 NOTE — Telephone Encounter (Signed)
Please advise 

## 2018-05-12 NOTE — Telephone Encounter (Signed)
NTBS, Can put her in NP orientation appt spot or add on. There's a 1030 tomorrow open too.

## 2018-05-12 NOTE — Telephone Encounter (Signed)
Left message for patient to call back  

## 2018-05-13 ENCOUNTER — Encounter: Payer: Self-pay | Admitting: Pediatrics

## 2018-05-13 ENCOUNTER — Ambulatory Visit (INDEPENDENT_AMBULATORY_CARE_PROVIDER_SITE_OTHER): Payer: Medicare Other | Admitting: Pediatrics

## 2018-05-13 VITALS — BP 119/70 | HR 81 | Temp 97.1°F | Ht 61.0 in

## 2018-05-13 DIAGNOSIS — R300 Dysuria: Secondary | ICD-10-CM

## 2018-05-13 DIAGNOSIS — N183 Chronic kidney disease, stage 3 unspecified: Secondary | ICD-10-CM

## 2018-05-13 DIAGNOSIS — Z23 Encounter for immunization: Secondary | ICD-10-CM | POA: Diagnosis not present

## 2018-05-13 DIAGNOSIS — E785 Hyperlipidemia, unspecified: Secondary | ICD-10-CM

## 2018-05-13 NOTE — Telephone Encounter (Signed)
Pt has appt 05/13/18 with dr Evette Doffing

## 2018-05-13 NOTE — Progress Notes (Signed)
  Subjective:   Patient ID: Tamara Stein, female    DOB: 1941-04-20, 77 y.o.   MRN: [id number] CC: Burning with urination  HPI: Tamara Stein is a 77 y.o. female   Started having symptoms about a week ago.  Only with burning in the morning.  No dysuria throughout the day otherwise.  Has not been drinking much fluids recently, but did start back yesterday into today.  Did not have any burning this morning.  Feeling her normal self.  No fevers.  No abdominal pain.  Occasionally still has diarrhea and stomach cramping.  This tends to happen when she is not regularly eating fiber or eating fruits and vegetables as recommended by GI in the past.  When her diet is better, she does not have as much trouble with her stomach per patient and her caregiver.  She is trying to do better about drinking water and eating appropriately.  Hyperlipidemia: Taking atorvastatin daily.  No side effects.  Relevant past medical, surgical, family and social history reviewed. Allergies and medications reviewed and updated. Social History   Tobacco Use  Smoking Status Never Smoker  Smokeless Tobacco Never Used   ROS: Per HPI   Objective:    BP 119/70   Pulse 81   Temp (!) 97.1 F (36.2 C) (Oral)   Ht 5' 1" (1.549 m)   BMI 38.17 kg/m   Wt Readings from Last 3 Encounters:  03/05/18 202 lb (91.6 kg)  08/16/16 202 lb (91.6 kg)  07/01/16 202 lb (91.6 kg)    Gen: NAD, alert, cooperative with exam, NCAT, in wheelchair EYES: EOMI, no conjunctival injection, or no icterus ENT:   OP without erythema CV: NRRR, normal S1/S2, no murmur, distal pulses 2+ b/l Resp: CTABL, no wheezes, normal WOB Abd: +BS, soft, NTND. no guarding or organomegaly Ext: No edema, warm Neuro: Alert and oriented MSK: normal muscle bulk  Assessment & Plan:  Tamara Stein was seen today for burning with urination.  Diagnoses and all orders for this visit:  Hyperlipidemia, unspecified hyperlipidemia type Stable, on atorvastatin -      LDL Cholesterol, Direct -     Cholesterol, total -     HDL cholesterol  Stage 3 chronic kidney disease ([place]) Continue to monitor -     BMP8+EGFR  Dysuria Will bring urine sample back to clinic from home, not able to leave here because of mobility constraints -     Urinalysis, Complete -     Urine Culture  Encounter for immunization -     Flu vaccine HIGH DOSE PF   Follow up plan: 3 months, sooner if needed Assunta Found, MD [place]

## 2018-05-14 LAB — BMP8+EGFR
BUN / CREAT RATIO: 25 (ref 12–28)
BUN: 35 mg/dL — AB (ref 8–27)
CO2: 22 mmol/L (ref 20–29)
CREATININE: 1.4 mg/dL — AB (ref 0.57–1.00)
Calcium: 8.7 mg/dL (ref 8.7–10.3)
Chloride: 105 mmol/L (ref 96–106)
GFR, EST AFRICAN AMERICAN: 42 mL/min/{1.73_m2} — AB (ref >59)
GFR, EST NON AFRICAN AMERICAN: 36 mL/min/{1.73_m2} — AB (ref >59)
Glucose: 100 mg/dL — ABNORMAL HIGH (ref 65–99)
Potassium: 4.2 mmol/L (ref 3.5–5.2)
Sodium: 141 mmol/L (ref 134–144)

## 2018-05-14 LAB — HDL CHOLESTEROL: HDL: 51 mg/dL (ref >39)

## 2018-05-14 LAB — LDL CHOLESTEROL, DIRECT: LDL Direct: 90 mg/dL (ref 0–99)

## 2018-05-14 LAB — CHOLESTEROL, TOTAL: Cholesterol, Total: 148 mg/dL (ref 100–199)

## 2018-05-17 ENCOUNTER — Other Ambulatory Visit: Payer: Medicare Other

## 2018-05-17 DIAGNOSIS — R300 Dysuria: Secondary | ICD-10-CM | POA: Diagnosis not present

## 2018-05-17 LAB — URINALYSIS, COMPLETE
Bilirubin, UA: NEGATIVE
Glucose, UA: NEGATIVE
KETONES UA: NEGATIVE
NITRITE UA: POSITIVE — AB
Protein, UA: NEGATIVE
RBC UA: NEGATIVE
SPEC GRAV UA: 1.015 (ref 1.005–1.030)
Urobilinogen, Ur: 0.2 mg/dL (ref 0.2–1.0)
pH, UA: 5 (ref 5.0–7.5)

## 2018-05-17 LAB — MICROSCOPIC EXAMINATION

## 2018-05-19 ENCOUNTER — Telehealth: Payer: Self-pay | Admitting: Pediatrics

## 2018-05-19 NOTE — Telephone Encounter (Signed)
See result note, should know more this afternoon then will send in abx

## 2018-05-19 NOTE — Telephone Encounter (Signed)
Aware of results. 

## 2018-05-20 ENCOUNTER — Other Ambulatory Visit: Payer: Self-pay | Admitting: Pediatrics

## 2018-05-20 ENCOUNTER — Telehealth: Payer: Self-pay | Admitting: *Deleted

## 2018-05-20 DIAGNOSIS — N3090 Cystitis, unspecified without hematuria: Secondary | ICD-10-CM

## 2018-05-20 LAB — URINE CULTURE

## 2018-05-20 MED ORDER — CIPROFLOXACIN HCL 250 MG PO TABS
250.0000 mg | ORAL_TABLET | Freq: Two times a day (BID) | ORAL | 0 refills | Status: DC
Start: 2018-05-20 — End: 2018-07-12

## 2018-05-20 NOTE — Telephone Encounter (Signed)
Called to inform patient that cipro was sent in for patient, but still waiting on culture sensitivity.  If antibiotic needs to change will call to inform patient. Left message for patient to call back with any questions or concerns

## 2018-06-15 ENCOUNTER — Ambulatory Visit (INDEPENDENT_AMBULATORY_CARE_PROVIDER_SITE_OTHER): Payer: Medicare Other | Admitting: Family Medicine

## 2018-06-15 ENCOUNTER — Encounter: Payer: Self-pay | Admitting: Family Medicine

## 2018-06-15 ENCOUNTER — Ambulatory Visit (INDEPENDENT_AMBULATORY_CARE_PROVIDER_SITE_OTHER): Payer: Medicare Other

## 2018-06-15 VITALS — BP 98/65 | HR 89 | Temp 97.0°F

## 2018-06-15 DIAGNOSIS — S40012A Contusion of left shoulder, initial encounter: Secondary | ICD-10-CM | POA: Diagnosis not present

## 2018-06-15 DIAGNOSIS — M25511 Pain in right shoulder: Secondary | ICD-10-CM | POA: Diagnosis not present

## 2018-06-15 DIAGNOSIS — M25512 Pain in left shoulder: Secondary | ICD-10-CM

## 2018-06-15 MED ORDER — TRAMADOL HCL 50 MG PO TABS: 25.0000 mg | ORAL_TABLET | Freq: Two times a day (BID) | ORAL | 0 refills | Status: DC | PRN

## 2018-06-15 NOTE — Patient Instructions (Signed)
I sent in a small quantity of tramadol for you to use if needed for severe breakthrough pain that is not relieved by your diclofenac.  You may take 1/2-1 full tablet every 12 hours if needed.  We discussed that this medication may cause sedation and increased risk of falls.  There is also potential interaction with your Cymbalta that can result in something called serotonin syndrome.  Since you have tolerated this medicine in the past, I feel that this is unlikely to occur but have listed the signs and symptoms of serotonin syndrome below.  Follow-up with Dr. Gladstone Lighter as scheduled.  Serotonin Syndrome Serotonin is a brain chemical that regulates the nervous system, which includes the brain, spinal cord, and nerves. Serotonin appears to play a role in all types of behavior, including appetite, emotions, movement, thinking, and response to stress. Excessively high levels of serotonin in the body can cause serotonin syndrome, which is a very dangerous condition. What are the causes? This condition can be caused by taking medicines or drugs that increase the level of serotonin in your body. These include:  Antidepressant medicines.  Migraine medicines.  Certain pain medicines.  Certain recreational drugs, including ecstasy, LSD, cocaine, and amphetamines.  Over-the-counter cough or cold medicines that contain dextromethorphan.  Certain herbal supplements, including St. John's wort, ginseng, and nutmeg.  This condition usually occurs when you take these medicines or drugs in combination, but it can also happen with a high dose of a single medicine or drug. What increases the risk? This condition is more likely to develop in:  People who have recently increased the dosage of medicine that increases the serotonin level.  People who just started taking medicine that increases the serotonin level.  What are the signs or symptoms? Symptoms of this condition usually happens within several hours of a  medicine change. Symptoms include:  Headache.  Muscle twitching or stiffness.  Diarrhea.  Confusion.  Restlessness or agitation.  Shivering or goose bumps.  Loss of muscle coordination.  Rapid heart rate.  Sweating.  Severe cases of serotonin syndromecan cause:  Irregular heartbeat.  Seizures.  Loss of consciousness.  High fever.  How is this diagnosed? This condition is diagnosed with a medical history and physical exam. You will be asked aboutyour symptoms and your use of medicines and recreational drugs. Your health care provider may also order lab work or additional tests to rule out other causes of your symptoms. How is this treated? The treatment for this condition depends on the severity of your symptoms. For mild cases, stopping the medicine that caused your condition is usually all that is needed. For moderate to severe cases, hospitalization is required to monitor you and to prevent further muscle damage. Follow these instructions at home:  Take over-the-counter and prescription medicines only as told by your health care provider. This is important.  Check with your health care provider before you start taking any new prescriptions, over-the-counter medicines, herbs, or supplements.  Avoid combining any medicines that can cause this condition to occur.  Keep all follow-up visits as told by your health care provider.This is important.  Maintain a healthy lifestyle. ? Eat healthy foods. ? Get plenty of sleep. ? Exercise regularly. ? Do not drink alcohol. ? Do not use recreational drugs. Contact a health care provider if:  Medicines do not seem to be helping.  Your symptoms do not improve or they get worse.  You have trouble taking care of yourself. Get help right away if:  You have worsening confusion, severe headache, chest pain, high fever, seizures, or loss of consciousness.  You have serious thoughts about hurting yourself or others.  You  experience serious side effects of medicine, such as swelling of your face, lips, tongue, or throat. This information is not intended to replace advice given to you by your health care provider. Make sure you discuss any questions you have with your health care provider. Document Released: 09/04/2004 Document Revised: 03/22/2016 Document Reviewed: 08/10/2014 Elsevier Interactive Patient Education  [employer].

## 2018-06-15 NOTE — Progress Notes (Signed)
Subjective: CC: Chronic shoulder pain PCP: Tamara Maize, MD KZS:[id number] [name] is a 77 y.o. female presenting to clinic today for:  1. Chronic shoulder pain Patient with longstanding history of bilateral shoulder pain.  She has history of impingement and is currently under the care of Dr. Gladstone Stein, who administers interval shoulder injections.  Last office visit was in August.  She is accompanied today by her daughter, who notes that she noticed a healing bruise along the left shoulder.  Patient reports that her left shoulder pain has worsened and seems to be worsened by elevation of the left upper extremity.  She denies any sensation changes.  She has an appointment with her orthopedist on Monday but wanted to be checked out prior to that time.  She does not recall trauma or any reasons why she would have a bruise on the left shoulder.  She uses Voltaren daily for arthritis.  She has not had any refills of tramadol in several years.   ROS: Per HPI  Allergies  Allergen Reactions  . Sulfa Antibiotics Anaphylaxis    Facial swelling   . Codeine Other (See Comments)    BLACK OUTS  . Erythromycin Swelling  . Ibuprofen Other (See Comments)    Stomach pain, muscle spasm, GI bleeding  . Penicillins Diarrhea    Has patient had a PCN reaction causing immediate rash, facial/tongue/throat swelling, SOB or lightheadedness with hypotension: No Has patient had a PCN reaction causing severe rash involving mucus membranes or skin necrosis: No Has patient had a PCN reaction that required hospitalization Unknown Has patient had a PCN reaction occurring within the last 10 years: No If all of the above answers are "NO", then may proceed with Cephalosporin use.    Past Medical History:  Diagnosis Date  . Allergy   . Anxiety   . Cataract   . 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 Chronic kidney disease   . Chronic knee pain   . Depression   . GERD (gastroesophageal reflux disease)   .  Hyperlipidemia   . Hypertension   . Thyroid disease     Current Outpatient Medications:  .  amLODipine (NORVASC) 10 MG tablet, TAKE 1 TABLET BY MOUTH DAILY FOR BLOOD PRESSURE, Disp: 90 tablet, Rfl: 1 .  aspirin EC 81 MG tablet, Take 81 mg by mouth every morning. , Disp: , Rfl:  .  atorvastatin (LIPITOR) 40 MG tablet, Take 1 tablet (40 mg total) by mouth at bedtime., Disp: 90 tablet, Rfl: 0 .  Calcium Carbonate-Vitamin D (CALTRATE 600+D PO), Take 1 tablet by mouth daily., Disp: , Rfl:  .  cetirizine (ZYRTEC) 10 MG chewable tablet, Chew 10 mg by mouth daily., Disp: , Rfl:  .  ciprofloxacin (CIPRO) 250 MG tablet, Take 1 tablet (250 mg total) by mouth 2 (two) times daily., Disp: 6 tablet, Rfl: 0 .  diclofenac (VOLTAREN) 50 MG EC tablet, TAKE 1 TABLET BY MOUTH (50MG TOTAL) THREE TIMES DAILY, Disp: 270 tablet, Rfl: 3 .  dicyclomine (BENTYL) 10 MG capsule, Take 1 capsule (10 mg total) by mouth 3 (three) times daily as needed for spasms., Disp: 90 capsule, Rfl: 1 .  docusate sodium (COLACE) 100 MG capsule, Take 1 capsule (100 mg total) by mouth 2 (two) times daily., Disp: 60 capsule, Rfl: 0 .  DULoxetine (CYMBALTA) 60 MG capsule, TAKE 1 CAPSULE BY MOUTH DAILY, Disp: 90 capsule, Rfl: 0 .  ferrous sulfate 325 (65 FE) MG tablet, Take 325 mg by mouth daily with  breakfast. Reported on 02/29/2016, Disp: , Rfl:  .  fluticasone (FLONASE) 50 MCG/ACT nasal spray, Place 2 sprays into both nostrils daily as needed for allergies., Disp: 48 g, Rfl: 1 .  furosemide (LASIX) 20 MG tablet, Take 1 tablet (20 mg total) by mouth daily as needed. As needed for swelling, Disp: 90 tablet, Rfl: 1 .  levothyroxine (SYNTHROID, LEVOTHROID) 50 MCG tablet, TAKE 1 TABLET BY MOUTH DAILY BEFORE BREAKFAST, Disp: 90 tablet, Rfl: 1 .  lisinopril (PRINIVIL,ZESTRIL) 30 MG tablet, TAKE 1 TABLET BY MOUTH DAILY, Disp: 90 tablet, Rfl: 1 .  meclizine (ANTIVERT) 25 MG tablet, Take 25 mg by mouth 3 (three) times daily as needed for dizziness.,  Disp: , Rfl:  .  Multiple Vitamin (MULTIVITAMIN WITH MINERALS) TABS tablet, Take 1 tablet by mouth daily., Disp: , Rfl:  .  omeprazole (PRILOSEC) 40 MG capsule, TAKE 1 CAPSULE BY MOUTH DAILY, Disp: 90 capsule, Rfl: 1 .  ondansetron (ZOFRAN) 4 MG tablet, Take 1 tablet (4 mg total) by mouth every 6 (six) hours., Disp: 12 tablet, Rfl: 0 .  raloxifene (EVISTA) 60 MG tablet, TAKE 1 TABLET BY MOUTH DAILY, Disp: 90 tablet, Rfl: 1 .  traMADol (ULTRAM) 50 MG tablet, Take 50 mg by mouth every 4 (four) hours as needed for moderate pain., Disp: , Rfl:  Social History   Socioeconomic History  . Marital status: Widowed    Spouse name: Not on file  . Number of children: Not on file  . Years of education: Not on file  . Highest education level: Not on file  Occupational History  . Not on file  Social Needs  . Financial resource strain: Not hard at all  . Food insecurity:    Worry: Never true    Inability: Never true  . Transportation needs:    Medical: Not on file    Non-medical: Not on file  Tobacco Use  . Smoking status: Never Smoker  . Smokeless tobacco: Never Used  Substance and Sexual Activity  . Alcohol use: No  . Drug use: No  . Sexual activity: Never  Lifestyle  . Physical activity:    Days per week: Not on file    Minutes per session: Not on file  . Stress: Not on file  Relationships  . Social connections:    Talks on phone: Not on file    Gets together: Not on file    Attends religious service: Not on file    Active member of club or organization: Not on file    Attends meetings of clubs or organizations: Not on file    Relationship status: Not on file  . Intimate partner violence:    Fear of current or ex partner: Not on file    Emotionally abused: Not on file    Physically abused: Not on file    Forced sexual activity: Not on file  Other Topics Concern  . Not on file  Social History Narrative  . Not on file   Family History  Problem Relation Age of Onset  .  Arthritis Sister   . Hyperlipidemia Sister   . Hypertension Sister   . Cancer Brother   . Diabetes Brother   . Heart disease Brother     Objective: Office vital signs reviewed. BP 98/65   Pulse 89   Temp (!) 97 F (36.1 C) (Oral)   Physical Examination:  General: Awake, alert, obese, No acute distress MSK:  Left shoulder: Patient has limited active range of  motion in flexion and abduction secondary to pain.  She has tenderness to palpation to the anterior and posterior shoulder.  No palpable bony abnormalities. Skin: 2-1/2 mm well-circumscribed healing bruise that is yellow in color along the left anterior shoulder. Neuro: UE light touch sensation grossly in tact  Dg Shoulder Left  Result Date: 06/15/2018 CLINICAL DATA:  Bruising and pain of left shoulder. EXAM: LEFT SHOULDER - 2+ VIEW COMPARISON:  Chest x-ray August 16, 2016 FINDINGS: There is no evidence of fracture or dislocation. Degenerative joint changes of the left shoulder are noted. Soft tissues are unremarkable. IMPRESSION: No acute fracture or dislocation. 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 Electronically Signed   By: Abelardo Diesel M.D.   On: 06/15/2018 16:28   Assessment/ Plan: 77 y.o. female   1. Acute pain of left shoulder Physical exam was remarkable for tenderness out of proportion to exam to the anterior and posterior shoulder.  X-rays were obtained which demonstrated no acute fracture dislocation but did show degenerative changes.  I have recommended that she keep her appointment with orthopedics.  Because she is in severe pain a small quantity of tramadol has been prescribed.  The national narcotic database was reviewed and there were no red flags.  We discussed that there is a possible drug drug interaction with her Cymbalta which could result in a serotonin syndrome.  Signs and symptoms of serotonin syndrome were reviewed with the patient.  A handout was provided.  I have advised her that the  medication can cause excessive sedation as well and recommended discontinuing use if she has any adverse side effects.  Both she and her daughter voiced good understanding and will follow-up with orthopedics as scheduled. - DG Shoulder Left; Future  2. Pain of both shoulder joints Chronic.  Follow-up with Ortho.   Orders Placed This Encounter  Procedures  . DG Shoulder Left    Standing Status:   Future    Number of Occurrences:   1    Standing Expiration Date:   08/16/2019    Order Specific Question:   Reason for Exam (SYMPTOM  OR DIAGNOSIS REQUIRED)    Answer:   pain    Order Specific Question:   Preferred imaging location?    Answer:   Internal    Order Specific Question:   Radiology Contrast Protocol - do NOT remove file path    Answer:   [email or web address]   Meds ordered this encounter  Medications  . traMADol (ULTRAM) 50 MG tablet    Sig: Take 0.5-1 tablets (25-50 mg total) by mouth every 12 (twelve) hours as needed for severe pain.    Dispense:  10 tablet    Refill:  0      Tamara Moulding, DO [place] [phone]

## 2018-06-21 DIAGNOSIS — M19012 Primary osteoarthritis, left shoulder: Secondary | ICD-10-CM | POA: Diagnosis not present

## 2018-06-21 DIAGNOSIS — M19011 Primary osteoarthritis, right shoulder: Secondary | ICD-10-CM | POA: Diagnosis not present

## 2018-07-12 ENCOUNTER — Encounter: Payer: Self-pay | Admitting: Family

## 2018-07-12 ENCOUNTER — Ambulatory Visit (INDEPENDENT_AMBULATORY_CARE_PROVIDER_SITE_OTHER): Payer: Medicare Other | Admitting: Family

## 2018-07-12 ENCOUNTER — Telehealth: Payer: Self-pay | Admitting: *Deleted

## 2018-07-12 VITALS — BP 122/76 | HR 84 | Temp 96.9°F | Ht 61.0 in

## 2018-07-12 DIAGNOSIS — B9689 Other specified bacterial agents as the cause of diseases classified elsewhere: Secondary | ICD-10-CM | POA: Diagnosis not present

## 2018-07-12 DIAGNOSIS — J208 Acute bronchitis due to other specified organisms: Secondary | ICD-10-CM | POA: Diagnosis not present

## 2018-07-12 MED ORDER — DOXYCYCLINE HYCLATE 100 MG PO TABS: 100.0000 mg | ORAL_TABLET | Freq: Two times a day (BID) | ORAL | 0 refills | Status: DC

## 2018-07-12 MED ORDER — LEVOFLOXACIN 500 MG PO TABS: 500.0000 mg | ORAL_TABLET | Freq: Every day | ORAL | 0 refills | Status: DC

## 2018-07-12 NOTE — Telephone Encounter (Signed)
Levofloxacin Prescription sent to pharmacy

## 2018-07-12 NOTE — Patient Instructions (Signed)

## 2018-07-12 NOTE — Progress Notes (Signed)
   Subjective:    Patient ID: Tamara Stein, female    DOB: [birth year], 77 y.o.   MRN: [id number]  Chief Complaint  Patient presents with  . head and chest congestion  . Cough    Cough  This is a new problem. The current episode started 1 to 4 weeks ago. The problem has been waxing and waning (was improving on Friday, but Saturday was feeling greatly worse). The problem occurs every few minutes. The cough is productive of sputum. Associated symptoms include chills, a fever, headaches, nasal congestion, a sore throat, shortness of breath and wheezing. Pertinent negatives include no ear congestion or ear pain. She has tried rest and OTC cough suppressant for the symptoms. The treatment provided mild relief.      Review of Systems  Constitutional: Positive for chills and fever.  HENT: Positive for sore throat. Negative for ear pain.   Respiratory: Positive for cough, shortness of breath and wheezing.   Neurological: Positive for headaches.  All other systems reviewed and are negative.      Objective:   Physical Exam  Constitutional: She is oriented to person, place, and time. She appears well-developed and well-nourished. No distress.  HENT:  Head: Normocephalic and atraumatic.  Right Ear: External ear normal.  Left Ear: External ear normal.  Nose: Mucosal edema present.  Mouth/Throat: Posterior oropharyngeal erythema present.  Eyes: Pupils are equal, round, and reactive to light.  Neck: Normal range of motion. Neck supple. No thyromegaly present.  Cardiovascular: Normal rate, regular rhythm, normal heart sounds and intact distal pulses.  No murmur heard. Pulmonary/Chest: Effort normal. No respiratory distress. She has decreased breath sounds in the left middle field. She has no wheezes.      Abdominal: Soft. Bowel sounds are normal. She exhibits no distension. There is no tenderness.  Musculoskeletal:  Wheelchair bound  Neurological: She is alert and oriented to person,  place, and time. She has normal reflexes. No cranial nerve deficit.  Skin: Skin is warm and dry.  Psychiatric: She has a normal mood and affect. Her behavior is normal. Judgment and thought content normal.  Vitals reviewed.    BP 122/76   Pulse 84   Temp (!) 96.9 F (36.1 C) (Oral)   Ht 5' 1"  (1.549 m)   BMI 38.17 kg/m      Assessment & Plan:  Tamara Stein comes in today with chief complaint of head and chest congestion and Cough   Diagnosis and orders addressed:  1. Acute bacterial bronchitis - Take meds as prescribed - Use a cool mist humidifier  -Use saline nose sprays frequently -Force fluids -For any cough or congestion  Use plain Mucinex- regular strength or max strength is fine -For fever or aces or pains- take tylenol or ibuprofen. -Throat lozenges if help -RTO if symptoms worsen or do not improve - doxycycline (VIBRA-TABS) 100 MG tablet; Take 1 tablet (100 mg total) by mouth 2 (two) times daily.  Dispense: 20 tablet; Refill: 0   Evelina Dun, FNP

## 2018-07-12 NOTE — Telephone Encounter (Signed)
Patient is at [place] and the doxycycline is $60.00 she can not afford can you send in another rx.

## 2018-07-13 ENCOUNTER — Telehealth: Payer: Self-pay | Admitting: Pediatrics

## 2018-07-13 NOTE — Telephone Encounter (Signed)
Tamara Stein states pt can't take levaquin as it kept pt up all night and she was having trouble breathing. Pt isn't having any SOB now. Family requested we add levaquin to the allergies in her chart which I did and they decided to take the Doxy even thought it cost $60.

## 2018-08-05 ENCOUNTER — Ambulatory Visit (INDEPENDENT_AMBULATORY_CARE_PROVIDER_SITE_OTHER): Payer: Medicare Other | Admitting: Nurse Practitioner

## 2018-08-05 ENCOUNTER — Encounter: Payer: Self-pay | Admitting: Nurse Practitioner

## 2018-08-05 VITALS — BP 134/75 | HR 80 | Temp 97.7°F | Ht 61.0 in

## 2018-08-05 DIAGNOSIS — J069 Acute upper respiratory infection, unspecified: Secondary | ICD-10-CM | POA: Diagnosis not present

## 2018-08-05 MED ORDER — CEFDINIR 300 MG PO CAPS: 300.0000 mg | ORAL_CAPSULE | Freq: Two times a day (BID) | ORAL | 0 refills | Status: DC

## 2018-08-05 NOTE — Progress Notes (Signed)
Subjective:    Patient ID: Tamara Stein, female    DOB: 07/29/1941, 77 y.o.   MRN: [id number]   Chief Complaint: Cough; Nasal Congestion; and Headache   HPI Patient comes in c/o cough and congestion. Started about 2-3 weeks ago. Will get better then come right back. Bad headache today. Lots of facial pressure. She is on flonase and saline mist. She has used delsym for cough which doeshelp cough.   Review of Systems  Constitutional: Negative for chills and fever.  HENT: Positive for congestion, rhinorrhea, sinus pressure and sinus pain. Negative for ear pain, sore throat and trouble swallowing.   Respiratory: Positive for cough. Negative for shortness of breath.   Cardiovascular: Negative.   Neurological: Positive for headaches.  Psychiatric/Behavioral: Negative.   All other systems reviewed and are negative.      Objective:   Physical Exam Constitutional:      Appearance: She is well-developed. She is obese.  HENT:     Right Ear: Hearing, tympanic membrane, ear canal and external ear normal.     Left Ear: Hearing, ear canal and external ear normal. Tympanic membrane is perforated.     Nose: Mucosal edema, congestion and rhinorrhea present.     Right Turbinates: Pale.     Mouth/Throat:     Lips: Pink.     Mouth: Mucous membranes are moist.     Pharynx: Oropharynx is clear.  Neck:     Musculoskeletal: Normal range of motion and neck supple.  Cardiovascular:     Rate and Rhythm: Normal rate and regular rhythm.  Pulmonary:     Effort: Pulmonary effort is normal.     Breath sounds: Normal breath sounds.  Lymphadenopathy:     Cervical: No cervical adenopathy.  Skin:    General: Skin is warm and dry.  Neurological:     General: No focal deficit present.     Mental Status: She is alert and oriented to person, place, and time.    BP 134/75   Pulse 80   Temp 97.7 F (36.5 C) (Oral)   Ht 5' 1"  (1.549 m)   BMI 38.17 kg/m      Assessment & Plan:  Tamara Stein in today with chief complaint of Cough; Nasal Congestion; and Headache   1. Upper respiratory infection with cough and congestion 1. Take meds as prescribed 2. Use a cool mist humidifier especially during the winter months and when heat has been humid. 3. Use saline nose sprays frequently 4. Saline irrigations of the nose can be very helpful if done frequently.  * 4X daily for 1 week*  * Use of a nettie pot can be helpful with this. Follow directions with this* 5. Drink plenty of fluids 6. Keep thermostat turn down low 7.For any cough or congestion  Use plain Mucinex- regular strength or max strength is fine   * Children- consult with Pharmacist for dosing 8. For fever or aces or pains- take tylenol or ibuprofen appropriate for age and weight.  * for fevers greater than 101 orally you may alternate ibuprofen and tylenol every  3 hours.   Meds ordered this encounter  Medications  . cefdinir (OMNICEF) 300 MG capsule    Sig: Take 1 capsule (300 mg total) by mouth 2 (two) times daily. 1 po BID    Dispense:  20 capsule    Refill:  0    Order Specific Question:   Supervising Provider    Answer:   Assunta Found  L [[id number]]   [place], FNP

## 2018-08-05 NOTE — Patient Instructions (Signed)

## 2018-08-06 ENCOUNTER — Ambulatory Visit: Payer: Medicare Other | Admitting: Pediatrics

## 2018-08-17 ENCOUNTER — Telehealth: Payer: Self-pay | Admitting: Pediatrics

## 2018-08-18 ENCOUNTER — Encounter: Payer: Self-pay | Admitting: Family Medicine

## 2018-08-18 ENCOUNTER — Ambulatory Visit (INDEPENDENT_AMBULATORY_CARE_PROVIDER_SITE_OTHER): Payer: Medicare Other | Admitting: Family Medicine

## 2018-08-18 DIAGNOSIS — N3000 Acute cystitis without hematuria: Secondary | ICD-10-CM

## 2018-08-18 DIAGNOSIS — R8290 Unspecified abnormal findings in urine: Secondary | ICD-10-CM | POA: Diagnosis not present

## 2018-08-18 LAB — URINALYSIS, COMPLETE
BILIRUBIN UA: NEGATIVE
Glucose, UA: NEGATIVE
KETONES UA: NEGATIVE
NITRITE UA: POSITIVE — AB
RBC UA: NEGATIVE
SPEC GRAV UA: 1.025 (ref 1.005–1.030)
UUROB: 0.2 mg/dL (ref 0.2–1.0)
pH, UA: 5.5 (ref 5.0–7.5)

## 2018-08-18 LAB — MICROSCOPIC EXAMINATION

## 2018-08-18 MED ORDER — CEFTRIAXONE SODIUM 1 G IJ SOLR
1.0000 g | Freq: Once | INTRAMUSCULAR | Status: AC
  Administered 2018-08-18: 1 g via INTRAMUSCULAR

## 2018-08-18 NOTE — Progress Notes (Signed)
BP (!) 149/86   Pulse 82   Temp (!) 96.6 F (35.9 C) (Oral)    Subjective:    Patient ID: Tamara Stein, female    DOB: 21-Apr-1941, 78 y.o.   MRN: [id number]  HPI: Tamara Stein is a 78 y.o. female presenting on 08/18/2018 for urine odor (x 4 days) and Fatigue   HPI Urinary odor and fatigue and confusion Patient is coming in with urinary odor and fatigue and confusion is been going on for the past 4 days and has been increasing.  She says she gets like this sometimes when she is had previous urinary tract infections which has happened frequently.  On the last culture it showed she grew E. coli which was resistant to a few different antibiotics but was sensitive to third-generation cephalosporins.  She denies any abdominal pain that she is noticed or nausea or vomiting.  She does complain of some flank pain and then the odor and the frequency.  Relevant past medical, surgical, family and social history reviewed and updated as indicated. Interim medical history since our last visit reviewed. Allergies and medications reviewed and updated.  Review of Systems  Constitutional: Negative for chills and fever.  HENT: Negative for congestion, ear discharge and ear pain.   Eyes: Negative for visual disturbance.  Respiratory: Negative for chest tightness and shortness of breath.   Cardiovascular: Negative for chest pain and leg swelling.  Gastrointestinal: Negative for abdominal pain.  Genitourinary: Positive for flank pain, frequency and urgency. Negative for difficulty urinating and dysuria.  Musculoskeletal: Negative for back pain and gait problem.  Skin: Negative for rash.  Neurological: Negative for light-headedness and headaches.  Psychiatric/Behavioral: Negative for agitation and behavioral problems.  All other systems reviewed and are negative.   Per HPI unless specifically indicated above        Objective:    BP (!) 149/86   Pulse 82   Temp (!) 96.6 F (35.9 C)  (Oral)   Wt Readings from Last 3 Encounters:  03/05/18 202 lb (91.6 kg)  08/16/16 202 lb (91.6 kg)  07/01/16 202 lb (91.6 kg)    Physical Exam Vitals signs and nursing note reviewed.  Constitutional:      General: She is not in acute distress.    Appearance: She is well-developed. She is not diaphoretic.  Eyes:     Conjunctiva/sclera: Conjunctivae normal.  Cardiovascular:     Rate and Rhythm: Normal rate and regular rhythm.     Heart sounds: Normal heart sounds. No murmur.  Pulmonary:     Effort: Pulmonary effort is normal. No respiratory distress.     Breath sounds: Normal breath sounds. No wheezing.  Abdominal:     General: Abdomen is flat. Bowel sounds are normal. There is no distension.     Tenderness: There is abdominal tenderness (Mild suprapubic tenderness). There is no right CVA tenderness, left CVA tenderness, guarding or rebound.  Musculoskeletal: Normal range of motion.        General: No tenderness.  Skin:    General: Skin is warm and dry.     Findings: No rash.  Neurological:     Mental Status: She is alert and oriented to person, place, and time.     Coordination: Coordination normal.  Psychiatric:        Behavior: Behavior normal.     Urinalysis: 6-10 WBCs, 3-10 RBCs, 0-10 epithelial cells, many bacteria, positive nitrite, 2+ protein    Assessment & Plan:   Problem List Items  Addressed This Visit      Genitourinary   UTI (urinary tract infection) - Primary   Relevant Medications   cefTRIAXone (ROCEPHIN) injection 1 g (Start on 08/18/2018 11:45 AM)   Other Relevant Orders   Urinalysis, Complete      Will treat with Rocephin, patient has previously tolerated Ceftin ear but she prefers the shot today because she wants to get rid of it quickly. Follow up plan: Return if symptoms worsen or fail to improve.  Counseling provided for all of the vaccine components Orders Placed This Encounter  Procedures  . Urinalysis, Complete    Caryl Pina,  MD [facility] 08/18/2018, 11:41 AM

## 2018-08-18 NOTE — Telephone Encounter (Signed)
Pt came in for appt

## 2018-08-18 NOTE — Addendum Note (Signed)
Addended by: Nigel Berthold C on: 08/18/2018 04:07 PM   Modules accepted: Orders

## 2018-08-21 LAB — URINE CULTURE

## 2018-08-26 ENCOUNTER — Telehealth: Payer: Self-pay | Admitting: Pediatrics

## 2018-08-26 ENCOUNTER — Telehealth: Payer: Self-pay | Admitting: Family Medicine

## 2018-08-26 MED ORDER — NITROFURANTOIN MONOHYD MACRO 100 MG PO CAPS: 100.0000 mg | ORAL_CAPSULE | Freq: Two times a day (BID) | ORAL | 0 refills | Status: DC

## 2018-08-26 NOTE — Telephone Encounter (Signed)
Please let the patient know that I sent Macrobid on it because her urine culture did grow a bacteria that was resistant to other antibiotics but sensitive to Macrobid.

## 2018-08-26 NOTE — Telephone Encounter (Signed)
Pt aware, Tamara Stein will pick up med today

## 2018-08-27 ENCOUNTER — Telehealth: Payer: Self-pay | Admitting: Pediatrics

## 2018-08-27 NOTE — Telephone Encounter (Signed)
Pt called and aware she has only been on meds for a day - needs to give it more time.

## 2018-08-31 ENCOUNTER — Telehealth: Payer: Self-pay | Admitting: Pediatrics

## 2018-08-31 NOTE — Telephone Encounter (Signed)
Patient thinks she may have UTI but unable to come in today,scheduled her an appointment for tomorrow, 09/01/2018, at 3:55 pm with Dr. Warrick Parisian.

## 2018-08-31 NOTE — Telephone Encounter (Signed)
Left message to call back  

## 2018-08-31 NOTE — Telephone Encounter (Signed)
Patient thinks she may have a UTI but is unable to come in today to be seen.  Scheduled her an appointment tomorrow at 3:55 pm with Dr. Warrick Parisian.

## 2018-09-01 ENCOUNTER — Encounter: Payer: Self-pay | Admitting: Family Medicine

## 2018-09-01 ENCOUNTER — Ambulatory Visit (INDEPENDENT_AMBULATORY_CARE_PROVIDER_SITE_OTHER): Payer: Medicare Other | Admitting: Family Medicine

## 2018-09-01 VITALS — BP 119/79 | HR 92 | Temp 97.2°F

## 2018-09-01 DIAGNOSIS — N390 Urinary tract infection, site not specified: Secondary | ICD-10-CM

## 2018-09-01 LAB — URINALYSIS, COMPLETE
Bilirubin, UA: NEGATIVE
Glucose, UA: NEGATIVE
Nitrite, UA: NEGATIVE
RBC UA: NEGATIVE
Specific Gravity, UA: 1.02 (ref 1.005–1.030)
Urobilinogen, Ur: 0.2 mg/dL (ref 0.2–1.0)
pH, UA: 6.5 (ref 5.0–7.5)

## 2018-09-01 LAB — MICROSCOPIC EXAMINATION: Renal Epithel, UA: NONE SEEN /hpf

## 2018-09-01 NOTE — Progress Notes (Signed)
   BP 119/79   Pulse 92   Temp (!) 97.2 F (36.2 C) (Oral)    Subjective:    Patient ID: Tamara Stein, female    DOB: 12/05/1940, 78 y.o.   MRN: [id number]  HPI: Tamara Stein is a 78 y.o. female presenting on 09/01/2018 for recent UTI (1/8 and would like her urine re checked to make sure infection is gone.)   HPI Patient is coming in because she has had recurrent UTIs would like to get it rechecked.  She says her symptoms are gone and she did finish the antibiotic completely but just wants to come in and get it rechecked today.  She denies any abdominal pain or flank pain today.  She denies any fevers or chills.  She denies any frequency or blood or burning.  Relevant past medical, surgical, family and social history reviewed and updated as indicated. Interim medical history since our last visit reviewed. Allergies and medications reviewed and updated.  Review of Systems  Constitutional: Negative for chills and fever.  Eyes: Negative for visual disturbance.  Respiratory: Negative for chest tightness and shortness of breath.   Cardiovascular: Negative for chest pain and leg swelling.  Gastrointestinal: Negative for abdominal pain.  Genitourinary: Negative for difficulty urinating, dysuria, flank pain, frequency and urgency.  Musculoskeletal: Negative for back pain and gait problem.  Skin: Negative for rash.  Neurological: Negative for light-headedness and headaches.  Psychiatric/Behavioral: Negative for agitation and behavioral problems.  All other systems reviewed and are negative.   Per HPI unless specifically indicated above       Objective:    BP 119/79   Pulse 92   Temp (!) 97.2 F (36.2 C) (Oral)   Wt Readings from Last 3 Encounters:  03/05/18 202 lb (91.6 kg)  08/16/16 202 lb (91.6 kg)  07/01/16 202 lb (91.6 kg)    Physical Exam Vitals signs and nursing note reviewed.  Constitutional:      General: She is not in acute distress.    Appearance: She is  well-developed. She is not diaphoretic.  Eyes:     Conjunctiva/sclera: Conjunctivae normal.  Cardiovascular:     Rate and Rhythm: Normal rate and regular rhythm.     Heart sounds: Normal heart sounds. No murmur.  Pulmonary:     Effort: Pulmonary effort is normal. No respiratory distress.     Breath sounds: Normal breath sounds. No wheezing or rales.  Neurological:     Mental Status: She is alert and oriented to person, place, and time.     Coordination: Coordination normal.  Psychiatric:        Behavior: Behavior normal.     Urinalysis: 6-10 WBCs, 0-2 RBCs, 0-10 epithelial cells, present mucus, few bacteria, trace ketones, 1+ proteins, trace leukocytes, nitrite negative    Assessment & Plan:   Problem List Items Addressed This Visit    None    Visit Diagnoses    Recurrent UTI    -  Primary   Relevant Orders   Urinalysis, Complete   Urine Culture    Patient is not having symptoms today, urine looks improved from before, will run urine culture   Follow up plan: Return if symptoms worsen or fail to improve.  Counseling provided for all of the vaccine components Orders Placed This Encounter  Procedures  . Urine Culture  . Urinalysis, Complete    Caryl Pina, MD [facility] 09/01/2018, 4:15 PM

## 2018-09-02 LAB — URINE CULTURE

## 2018-09-03 ENCOUNTER — Telehealth: Payer: Self-pay | Admitting: Family Medicine

## 2018-09-03 NOTE — Telephone Encounter (Signed)
Pt is wanting to speak to nurse about other results about ecoli and something about a urologist.  Pt saw dettinger 1/22

## 2018-09-03 NOTE — Telephone Encounter (Signed)
Pt notified of urine culture results Verbalizes understanding

## 2018-09-06 ENCOUNTER — Ambulatory Visit (INDEPENDENT_AMBULATORY_CARE_PROVIDER_SITE_OTHER): Payer: Medicare Other | Admitting: Family Medicine

## 2018-09-06 ENCOUNTER — Encounter: Payer: Self-pay | Admitting: Family Medicine

## 2018-09-06 ENCOUNTER — Ambulatory Visit: Payer: Medicare Other | Admitting: Family

## 2018-09-06 ENCOUNTER — Ambulatory Visit: Payer: Medicare Other | Admitting: Pediatrics

## 2018-09-06 VITALS — BP 133/73 | HR 101 | Temp 97.1°F | Ht 62.0 in | Wt 192.0 lb

## 2018-09-06 DIAGNOSIS — G9340 Encephalopathy, unspecified: Secondary | ICD-10-CM | POA: Diagnosis not present

## 2018-09-06 DIAGNOSIS — M1990 Unspecified osteoarthritis, unspecified site: Secondary | ICD-10-CM | POA: Diagnosis not present

## 2018-09-06 DIAGNOSIS — G319 Degenerative disease of nervous system, unspecified: Secondary | ICD-10-CM | POA: Diagnosis not present

## 2018-09-06 DIAGNOSIS — Z993 Dependence on wheelchair: Secondary | ICD-10-CM | POA: Diagnosis not present

## 2018-09-06 DIAGNOSIS — G119 Hereditary ataxia, unspecified: Secondary | ICD-10-CM

## 2018-09-06 DIAGNOSIS — M7542 Impingement syndrome of left shoulder: Secondary | ICD-10-CM | POA: Diagnosis not present

## 2018-09-06 NOTE — Patient Instructions (Signed)
I will work on Financial controller covered.  Your urine culture did not demonstrate significant bacterial growth.

## 2018-09-06 NOTE — Progress Notes (Signed)
Subjective: CC: Face to Face for hoveround mobility device PCP: Dettinger, Tamara Kaufmann, MD ERD:EYCXKGY Stein is a 78 y.o. female presenting to clinic today for:  1. 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  She knows that this is secondary to cerebellar degeneration.  She was evaluated by neurology many years ago.  She has been totally dependent on an electronic wheelchair for some time now.  She notes difficulty with range of motion of bilateral upper extremities.  She is able to perform self transfers but notes that she has rails that she is able to pull up and slide herself out of her wheelchair.  She is able to self toilet but requires her Hoveround to get to and from the toilet.  She is unable to use a cane or walker because she cannot stand independently.  She is unable to prepare her own meals but can self-feed.  Her current Hoveround is over 72 years old and she is needing replacement.  No history or current pressure sores.  ROS: Per HPI  Allergies  Allergen Reactions  . Sulfa Antibiotics Anaphylaxis    Facial swelling   . Codeine Other (See Comments)    BLACK OUTS  . Doxycycline Nausea Only  . Erythromycin Swelling  . Ibuprofen Other (See Comments)    Stomach pain, muscle spasm, GI bleeding  . Levaquin [Levofloxacin] Other (See Comments)    Insomnia/trouble breathing  . Penicillins Diarrhea    Has patient had a PCN reaction causing immediate rash, facial/tongue/throat swelling, SOB or lightheadedness with hypotension: No Has patient had a PCN reaction causing severe rash involving mucus membranes or skin necrosis: No Has patient had a PCN reaction that required hospitalization Unknown Has patient had a PCN reaction occurring within the last 10 years: No If all of the above answers are "NO", then may proceed with Cephalosporin use.    Past Medical History:  Diagnosis Date  . Allergy   . Anxiety   . Cataract   .  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 Chronic kidney disease   . Chronic knee pain   . Depression   . GERD (gastroesophageal reflux disease)   . Hyperlipidemia   . Hypertension   . Thyroid disease     Current Outpatient Medications:  .  amLODipine (NORVASC) 10 MG tablet, TAKE 1 TABLET BY MOUTH DAILY FOR BLOOD PRESSURE, Disp: 90 tablet, Rfl: 1 .  aspirin EC 81 MG tablet, Take 81 mg by mouth every morning. , Disp: , Rfl:  .  atorvastatin (LIPITOR) 40 MG tablet, Take 1 tablet (40 mg total) by mouth at bedtime., Disp: 90 tablet, Rfl: 0 .  Calcium Carbonate-Vitamin D (CALTRATE 600+D PO), Take 1 tablet by mouth daily., Disp: , Rfl:  .  cetirizine (ZYRTEC) 10 MG chewable tablet, Chew 10 mg by mouth daily., Disp: , Rfl:  .  diclofenac (VOLTAREN) 50 MG EC tablet, TAKE 1 TABLET BY MOUTH (50MG TOTAL) THREE TIMES DAILY, Disp: 270 tablet, Rfl: 3 .  dicyclomine (BENTYL) 10 MG capsule, Take 1 capsule (10 mg total) by mouth 3 (three) times daily as needed for spasms., Disp: 90 capsule, Rfl: 1 .  docusate sodium (COLACE) 100 MG capsule, Take 1 capsule (100 mg total) by mouth 2 (two) times daily., Disp: 60 capsule, Rfl: 0 .  DULoxetine (CYMBALTA) 60 MG capsule, TAKE 1 CAPSULE BY MOUTH DAILY, Disp: 90 capsule, Rfl: 0 .  ferrous sulfate 325 (65 FE) MG tablet, Take 325 mg by  mouth daily with breakfast. Reported on 02/29/2016, Disp: , Rfl:  .  fluticasone (FLONASE) 50 MCG/ACT nasal spray, Place 2 sprays into both nostrils daily as needed for allergies., Disp: 48 g, Rfl: 1 .  furosemide (LASIX) 20 MG tablet, Take 1 tablet (20 mg total) by mouth daily as needed. As needed for swelling, Disp: 90 tablet, Rfl: 1 .  levothyroxine (SYNTHROID, LEVOTHROID) 50 MCG tablet, TAKE 1 TABLET BY MOUTH DAILY BEFORE BREAKFAST, Disp: 90 tablet, Rfl: 1 .  lisinopril (PRINIVIL,ZESTRIL) 30 MG tablet, TAKE 1 TABLET BY MOUTH DAILY, Disp: 90 tablet, Rfl: 1 .  meclizine (ANTIVERT) 25 MG tablet, Take 25 mg by mouth 3 (three) times daily as  needed for dizziness., Disp: , Rfl:  .  Multiple Vitamin (MULTIVITAMIN WITH MINERALS) TABS tablet, Take 1 tablet by mouth daily., Disp: , Rfl:  .  omeprazole (PRILOSEC) 40 MG capsule, TAKE 1 CAPSULE BY MOUTH DAILY, Disp: 90 capsule, Rfl: 1 .  ondansetron (ZOFRAN) 4 MG tablet, Take 1 tablet (4 mg total) by mouth every 6 (six) hours., Disp: 12 tablet, Rfl: 0 .  raloxifene (EVISTA) 60 MG tablet, TAKE 1 TABLET BY MOUTH DAILY, Disp: 90 tablet, Rfl: 1 .  traMADol (ULTRAM) 50 MG tablet, Take 0.5-1 tablets (25-50 mg total) by mouth every 12 (twelve) hours as needed for severe pain., Disp: 10 tablet, Rfl: 0 Social History   Socioeconomic History  . Marital status: Widowed    Spouse name: Not on file  . Number of children: Not on file  . Years of education: Not on file  . Highest education level: Not on file  Occupational History  . Not on file  Social Needs  . Financial resource strain: Not hard at all  . Food insecurity:    Worry: Never true    Inability: Never true  . Transportation needs:    Medical: Not on file    Non-medical: Not on file  Tobacco Use  . Smoking status: Never Smoker  . Smokeless tobacco: Never Used  Substance and Sexual Activity  . Alcohol use: No  . Drug use: No  . Sexual activity: Never  Lifestyle  . Physical activity:    Days per week: Not on file    Minutes per session: Not on file  . Stress: Not on file  Relationships  . Social connections:    Talks on phone: Not on file    Gets together: Not on file    Attends religious service: Not on file    Active member of club or organization: Not on file    Attends meetings of clubs or organizations: Not on file    Relationship status: Not on file  . Intimate partner violence:    Fear of current or ex partner: Not on file    Emotionally abused: Not on file    Physically abused: Not on file    Forced sexual activity: Not on file  Other Topics Concern  . Not on file  Social History Narrative  . Not on file    Family History  Problem Relation Age of Onset  . Arthritis Sister   . Hyperlipidemia Sister   . Hypertension Sister   . Cancer Brother   . Diabetes Brother   . Heart disease Brother     Objective: Office vital signs reviewed. BP 133/73   Pulse (!) 101   Temp (!) 97.1 F (36.2 C) (Oral)   Ht 5' 2"  (1.575 m)   Wt 192 lb (87.1 kg)  SpO2 99%   BMI 35.12 kg/m   Physical Examination:  General: Awake, alert, obese, No acute distress HEENT: Normal, sclera white, MMM Cardio: regular rate and rhythm, S1S2 heard, no murmurs appreciated Pulm: clear to auscultation bilaterally, no wheezes, rhonchi or rales; normal work of breathing on room air Extremities: warm, well perfused, No edema, cyanosis or clubbing; +2 pulses bilaterally MSK: Gait not assessed secondary to patient's inability to stand.  She is dependent upon power wheelchair for mobility.  She has poor tone in both upper and lower extremities.  Upper extremities: 4/5 strength.  Light touch sensation grossly intact.  Active range of motion limited in abduction and and internal rotation.  Lower extremities: 4/5 strength.  Light touch sensation grossly intact Skin: She has a small excoriation that is healing on the dorsal aspect of the right hand. Neuro: Abnormal cerebellar testing.  Abnormal finger-to-nose test.  Abnormal heel-to-shin test.  Speech somewhat difficult to understand.  Assessment/ Plan: Tamara Stein is a 78 y.o. female here for a face for face visit for renewal of her power wheelchair.  Patient is unable to use cane or walker for ambulation secondary to significant cerebellar disease impacting her ability to stand and ambulate.  She is unable to use a manual wheelchair secondary to chronic arthritic changes and shoulder impingement.  Postural stability is lacking and I feel that a scooter would likely not be safe.  She would benefit from continued use of her power wheelchair, as she has maintained safety and  is able to operate this machine without difficulty.  She certainly uses a regularly to maintain ability to perform ADLs including transfers from bed, to toilet and other areas of the home.  Patient is willing and motivated to continue use of her power wheelchair in the home.  1. 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  Continue regular follow up with neurologist in [place].  2. Wheelchair dependent  3. 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 Arthritis Continue current regimen.    6. Internal impingement of left shoulder Continue seeing orthopedist as scheduled.   No orders of the defined types were placed in this encounter.  No orders of the defined types were placed in this encounter.  Total time spent with patient 25 minutes.  Greater than 50% of encounter spent in coordination of care/counseling.   Janora Norlander, DO [place] [phone]

## 2018-09-07 ENCOUNTER — Telehealth: Payer: Self-pay | Admitting: Family Medicine

## 2018-09-07 DIAGNOSIS — M25561 Pain in right knee: Secondary | ICD-10-CM | POA: Diagnosis not present

## 2018-09-07 DIAGNOSIS — M25562 Pain in left knee: Secondary | ICD-10-CM | POA: Diagnosis not present

## 2018-09-07 NOTE — Telephone Encounter (Signed)
Forms refaxed

## 2018-09-30 DIAGNOSIS — Z029 Encounter for administrative examinations, unspecified: Secondary | ICD-10-CM

## 2018-10-13 DIAGNOSIS — R29898 Other symptoms and signs involving the musculoskeletal system: Secondary | ICD-10-CM | POA: Diagnosis not present

## 2018-10-13 DIAGNOSIS — G319 Degenerative disease of nervous system, unspecified: Secondary | ICD-10-CM | POA: Diagnosis not present

## 2018-10-13 DIAGNOSIS — G43709 Chronic migraine without aura, not intractable, without status migrainosus: Secondary | ICD-10-CM | POA: Diagnosis not present

## 2018-10-13 DIAGNOSIS — R202 Paresthesia of skin: Secondary | ICD-10-CM | POA: Diagnosis not present

## 2018-10-13 DIAGNOSIS — R200 Anesthesia of skin: Secondary | ICD-10-CM | POA: Diagnosis not present

## 2018-10-16 ENCOUNTER — Encounter: Payer: Self-pay | Admitting: Family Medicine

## 2018-10-16 ENCOUNTER — Ambulatory Visit (INDEPENDENT_AMBULATORY_CARE_PROVIDER_SITE_OTHER): Payer: Medicare Other | Admitting: Family Medicine

## 2018-10-16 VITALS — BP 129/71 | HR 85 | Temp 97.6°F | Ht 62.0 in

## 2018-10-16 DIAGNOSIS — R350 Frequency of micturition: Secondary | ICD-10-CM

## 2018-10-16 DIAGNOSIS — N3001 Acute cystitis with hematuria: Secondary | ICD-10-CM

## 2018-10-16 LAB — URINALYSIS, COMPLETE
Bilirubin, UA: NEGATIVE
Glucose, UA: NEGATIVE
Ketones, UA: NEGATIVE
Nitrite, UA: POSITIVE — AB
Protein, UA: NEGATIVE
RBC, UA: NEGATIVE
Specific Gravity, UA: 1.015 (ref 1.005–1.030)
Urobilinogen, Ur: 0.2 mg/dL (ref 0.2–1.0)
pH, UA: 5.5 (ref 5.0–7.5)

## 2018-10-16 LAB — MICROSCOPIC EXAMINATION

## 2018-10-16 MED ORDER — CEFDINIR 300 MG PO CAPS: 300.0000 mg | ORAL_CAPSULE | Freq: Two times a day (BID) | ORAL | 0 refills | Status: AC

## 2018-10-16 MED ORDER — CEFTRIAXONE SODIUM 1 G IJ SOLR
1.0000 g | Freq: Once | INTRAMUSCULAR | Status: AC
  Administered 2018-10-16: 1 g via INTRAMUSCULAR

## 2018-10-16 NOTE — Progress Notes (Signed)
Subjective: CC: Urinary frequency/incontinence PCP: Janora Norlander, DO VQQ:[id number] Tamara Stein is a 78 y.o. female presenting to clinic today for:  1.  Urinary frequency/incontinence Patient reports a couple day history of increased frequency where she is having multiple accidents per day despite having reduced her fluid intake over the last 2 days.  She denies any hematuria but does note some pelvic pressure.  No fevers, nausea, vomiting or new back pain.  She has a history of recurrent UTIs.   ROS: Per HPI  Allergies  Allergen Reactions  . Sulfa Antibiotics Anaphylaxis    Facial swelling   . Codeine Other (See Comments)    BLACK OUTS  . Doxycycline Nausea Only  . Erythromycin Swelling  . Ibuprofen Other (See Comments)    Stomach pain, muscle spasm, GI bleeding  . Levaquin [Levofloxacin] Other (See Comments)    Insomnia/trouble breathing  . Penicillins Diarrhea    Has patient had a PCN reaction causing immediate rash, facial/tongue/throat swelling, SOB or lightheadedness with hypotension: No Has patient had a PCN reaction causing severe rash involving mucus membranes or skin necrosis: No Has patient had a PCN reaction that required hospitalization Unknown Has patient had a PCN reaction occurring within the last 10 years: No If all of the above answers are "NO", then may proceed with Cephalosporin use.    Past Medical History:  Diagnosis Date  . Allergy   . Anxiety   . Cataract   . 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 Chronic kidney disease   . Chronic knee pain   . Depression   . GERD (gastroesophageal reflux disease)   . Hyperlipidemia   . Hypertension   . Thyroid disease     Current Outpatient Medications:  .  amLODipine (NORVASC) 10 MG tablet, TAKE 1 TABLET BY MOUTH DAILY FOR BLOOD PRESSURE, Disp: 90 tablet, Rfl: 1 .  aspirin EC 81 MG tablet, Take 81 mg by mouth every morning. , Disp: , Rfl:  .  atorvastatin (LIPITOR) 40 MG tablet, Take 1 tablet (40 mg total)  by mouth at bedtime., Disp: 90 tablet, Rfl: 0 .  Calcium Carbonate-Vitamin D (CALTRATE 600+D PO), Take 1 tablet by mouth daily., Disp: , Rfl:  .  cetirizine (ZYRTEC) 10 MG chewable tablet, Chew 10 mg by mouth daily., Disp: , Rfl:  .  diclofenac (VOLTAREN) 50 MG EC tablet, TAKE 1 TABLET BY MOUTH (50MG TOTAL) THREE TIMES DAILY, Disp: 270 tablet, Rfl: 3 .  dicyclomine (BENTYL) 10 MG capsule, Take 1 capsule (10 mg total) by mouth 3 (three) times daily as needed for spasms., Disp: 90 capsule, Rfl: 1 .  docusate sodium (COLACE) 100 MG capsule, Take 1 capsule (100 mg total) by mouth 2 (two) times daily., Disp: 60 capsule, Rfl: 0 .  DULoxetine (CYMBALTA) 60 MG capsule, TAKE 1 CAPSULE BY MOUTH DAILY, Disp: 90 capsule, Rfl: 0 .  ferrous sulfate 325 (65 FE) MG tablet, Take 325 mg by mouth daily with breakfast. Reported on 02/29/2016, Disp: , Rfl:  .  fluticasone (FLONASE) 50 MCG/ACT nasal spray, Place 2 sprays into both nostrils daily as needed for allergies., Disp: 48 g, Rfl: 1 .  furosemide (LASIX) 20 MG tablet, Take 1 tablet (20 mg total) by mouth daily as needed. As needed for swelling, Disp: 90 tablet, Rfl: 1 .  levothyroxine (SYNTHROID, LEVOTHROID) 50 MCG tablet, TAKE 1 TABLET BY MOUTH DAILY BEFORE BREAKFAST, Disp: 90 tablet, Rfl: 1 .  lisinopril (PRINIVIL,ZESTRIL) 30 MG tablet, TAKE 1 TABLET BY MOUTH DAILY,  Disp: 90 tablet, Rfl: 1 .  meclizine (ANTIVERT) 25 MG tablet, Take 25 mg by mouth 3 (three) times daily as needed for dizziness., Disp: , Rfl:  .  Multiple Vitamin (MULTIVITAMIN WITH MINERALS) TABS tablet, Take 1 tablet by mouth daily., Disp: , Rfl:  .  omeprazole (PRILOSEC) 40 MG capsule, TAKE 1 CAPSULE BY MOUTH DAILY, Disp: 90 capsule, Rfl: 1 .  ondansetron (ZOFRAN) 4 MG tablet, Take 1 tablet (4 mg total) by mouth every 6 (six) hours., Disp: 12 tablet, Rfl: 0 .  raloxifene (EVISTA) 60 MG tablet, TAKE 1 TABLET BY MOUTH DAILY, Disp: 90 tablet, Rfl: 1 .  traMADol (ULTRAM) 50 MG tablet, Take 0.5-1  tablets (25-50 mg total) by mouth every 12 (twelve) hours as needed for severe pain., Disp: 10 tablet, Rfl: 0 Social History   Socioeconomic History  . Marital status: Widowed    Spouse name: Not on file  . Number of children: Not on file  . Years of education: Not on file  . Highest education level: Not on file  Occupational History  . Not on file  Social Needs  . Financial resource strain: Not hard at all  . Food insecurity:    Worry: Never true    Inability: Never true  . Transportation needs:    Medical: Not on file    Non-medical: Not on file  Tobacco Use  . Smoking status: Never Smoker  . Smokeless tobacco: Never Used  Substance and Sexual Activity  . Alcohol use: No  . Drug use: No  . Sexual activity: Never  Lifestyle  . Physical activity:    Days per week: Not on file    Minutes per session: Not on file  . Stress: Not on file  Relationships  . Social connections:    Talks on phone: Not on file    Gets together: Not on file    Attends religious service: Not on file    Active member of club or organization: Not on file    Attends meetings of clubs or organizations: Not on file    Relationship status: Not on file  . Intimate partner violence:    Fear of current or ex partner: Not on file    Emotionally abused: Not on file    Physically abused: Not on file    Forced sexual activity: Not on file  Other Topics Concern  . Not on file  Social History Narrative  . Not on file   Family History  Problem Relation Age of Onset  . Arthritis Sister   . Hyperlipidemia Sister   . Hypertension Sister   . Cancer Brother   . Diabetes Brother   . Heart disease Brother     Objective: Office vital signs reviewed. BP 129/71   Pulse 85   Temp 97.6 F (36.4 C) (Oral)   Ht 5' 2"  (1.575 m)   BMI 35.12 kg/m   Physical Examination:  General: Awake, alert, chronically ill appearing. No acute distress GU: +suprapubic TTP.  Assessment/ Plan: 78 y.o. female   1.  Acute cystitis with hematuria Patient is afebrile nontoxic-appearing.  Her physical exam was notable for suprapubic tenderness to palpation.  Urinalysis with nitrate positive and leukocyte.  Her urine microscopy with 11-30 white blood cells, many bacteria.  This is been sent for urine culture.  She was given a dose of Rocephin here in office and instructed to start the [place] this evening and start twice daily dosing tomorrow.  Home care instructions  reviewed with the patient.  Increase clear fluids such as water.  Avoid sugary drinks and caffeine.  Follow-up PRN - cefTRIAXone (ROCEPHIN) injection 1 g - cefdinir (OMNICEF) 300 MG capsule; Take 1 capsule (300 mg total) by mouth 2 (two) times daily for 5 days. 1 po BID  Dispense: 10 capsule; Refill: 0 - Urine Culture  2. Urinary frequency - Urinalysis, Complete - cefdinir (OMNICEF) 300 MG capsule; Take 1 capsule (300 mg total) by mouth 2 (two) times daily for 5 days. 1 po BID  Dispense: 10 capsule; Refill: 0   Orders Placed This Encounter  Procedures  . Urine Culture  . Urinalysis, Complete   Meds ordered this encounter  Medications  . cefTRIAXone (ROCEPHIN) injection 1 g  . cefdinir (OMNICEF) 300 MG capsule    Sig: Take 1 capsule (300 mg total) by mouth 2 (two) times daily for 5 days. 1 po BID    Dispense:  10 capsule    Refill:  [place], DO [place] [phone]

## 2018-10-16 NOTE — Patient Instructions (Signed)
You got a shot of Rocephin today. Start the antibiotic pill TONIGHT with dinner.  You can start taking it TWO TIMES a day TOMORROW.   Urinary Tract Infection, Adult A urinary tract infection (UTI) is an infection of any part of the urinary tract. The urinary tract includes:  The kidneys.  The ureters.  The bladder.  The urethra. These organs make, store, and get rid of pee (urine) in the body. What are the causes? This is caused by germs (bacteria) in your genital area. These germs grow and cause swelling (inflammation) of your urinary tract. What increases the risk? You are more likely to develop this condition if:  You have a small, thin tube (catheter) to drain pee.  You cannot control when you pee or poop (incontinence).  You are female, and: ? You use these methods to prevent pregnancy: ? A medicine that kills sperm (spermicide). ? A device that blocks sperm (diaphragm). ? You have low levels of a female hormone (estrogen). ? You are pregnant.  You have genes that add to your risk.  You are sexually active.  You take antibiotic medicines.  You have trouble peeing because of: ? A prostate that is bigger than normal, if you are female. ? A blockage in the part of your body that drains pee from the bladder (urethra). ? A kidney stone. ? A nerve condition that affects your bladder (neurogenic bladder). ? Not getting enough to drink. ? Not peeing often enough.  You have other conditions, such as: ? Diabetes. ? A weak disease-fighting system (immune system). ? Sickle cell disease. ? Gout. ? Injury of the spine. What are the signs or symptoms? Symptoms of this condition include:  Needing to pee right away (urgently).  Peeing often.  Peeing small amounts often.  Pain or burning when peeing.  Blood in the pee.  Pee that smells bad or not like normal.  Trouble peeing.  Pee that is cloudy.  Fluid coming from the vagina, if you are female.  Pain in the  belly or lower back. Other symptoms include:  Throwing up (vomiting).  No urge to eat.  Feeling mixed up (confused).  Being tired and grouchy (irritable).  A fever.  Watery poop (diarrhea). How is this treated? This condition may be treated with:  Antibiotic medicine.  Other medicines.  Drinking enough water. Follow these instructions at home:  Medicines  Take over-the-counter and prescription medicines only as told by your doctor.  If you were prescribed an antibiotic medicine, take it as told by your doctor. Do not stop taking it even if you start to feel better. General instructions  Make sure you: ? Pee until your bladder is empty. ? Do not hold pee for a long time. ? Empty your bladder after sex. ? Wipe from front to back after pooping if you are a female. Use each tissue one time when you wipe.  Drink enough fluid to keep your pee pale yellow.  Keep all follow-up visits as told by your doctor. This is important. Contact a doctor if:  You do not get better after 1-2 days.  Your symptoms go away and then come back. Get help right away if:  You have very bad back pain.  You have very bad pain in your lower belly.  You have a fever.  You are sick to your stomach (nauseous).  You are throwing up. Summary  A urinary tract infection (UTI) is an infection of any part of the urinary  tract.  This condition is caused by germs in your genital area.  There are many risk factors for a UTI. These include having a small, thin tube to drain pee and not being able to control when you pee or poop.  Treatment includes antibiotic medicines for germs.  Drink enough fluid to keep your pee pale yellow. This information is not intended to replace advice given to you by your health care provider. Make sure you discuss any questions you have with your health care provider. Document Released: 01/14/2008 Document Revised: 02/04/2018 Document Reviewed:  02/04/2018 Elsevier Interactive Patient Education  2019 [employer].

## 2018-10-18 ENCOUNTER — Ambulatory Visit: Payer: Medicare Other | Admitting: Physician Assistant

## 2018-10-19 LAB — URINE CULTURE

## 2018-10-22 ENCOUNTER — Telehealth: Payer: Self-pay | Admitting: Family Medicine

## 2018-10-22 ENCOUNTER — Other Ambulatory Visit: Payer: Self-pay

## 2018-10-22 DIAGNOSIS — K219 Gastro-esophageal reflux disease without esophagitis: Secondary | ICD-10-CM

## 2018-10-22 DIAGNOSIS — I10 Essential (primary) hypertension: Secondary | ICD-10-CM

## 2018-10-22 DIAGNOSIS — E039 Hypothyroidism, unspecified: Secondary | ICD-10-CM

## 2018-10-22 MED ORDER — OMEPRAZOLE 40 MG PO CPDR: 40.0000 mg | DELAYED_RELEASE_CAPSULE | Freq: Every day | ORAL | 1 refills | Status: DC

## 2018-10-22 MED ORDER — RALOXIFENE HCL 60 MG PO TABS: 60.0000 mg | ORAL_TABLET | Freq: Every day | ORAL | 1 refills | Status: DC

## 2018-10-22 MED ORDER — DICLOFENAC SODIUM 50 MG PO TBEC: DELAYED_RELEASE_TABLET | ORAL | 1 refills | Status: DC

## 2018-10-22 MED ORDER — LEVOTHYROXINE SODIUM 50 MCG PO TABS: ORAL_TABLET | ORAL | 1 refills | Status: DC

## 2018-10-22 MED ORDER — AMLODIPINE BESYLATE 10 MG PO TABS: 10.0000 mg | ORAL_TABLET | Freq: Every day | ORAL | 1 refills | Status: DC

## 2018-10-22 MED ORDER — LISINOPRIL 30 MG PO TABS: 30.0000 mg | ORAL_TABLET | Freq: Every day | ORAL | 1 refills | Status: DC

## 2018-10-22 NOTE — Telephone Encounter (Signed)
What is the name of the medication? Amlodipine 10 mg, Atorvastatin 40 mg, Diclofenac 50 mg, duloxentine 60 mg, Levothyroxine 50 mg, Lisinopril 30 mg, Omeprazole 40 mg, Raloxifene 60 mg  Have you contacted your pharmacy to request a refill? NO  Which pharmacy would you like this sent to? [facility] Medicare RX F# [phone] Member ID [id number]   Patient notified that their request is being sent to the clinical staff for review and that they should receive a call once it is complete. If they do not receive a call within 24 hours they can check with their pharmacy or our office.

## 2018-10-26 ENCOUNTER — Telehealth: Payer: Self-pay | Admitting: Family Medicine

## 2018-10-26 DIAGNOSIS — N3001 Acute cystitis with hematuria: Secondary | ICD-10-CM

## 2018-10-26 DIAGNOSIS — R350 Frequency of micturition: Secondary | ICD-10-CM

## 2018-10-26 NOTE — Telephone Encounter (Signed)
Patient aware and order placed

## 2018-10-26 NOTE — Telephone Encounter (Signed)
Yes.  If UTI, will plan to refer to urology for recurrent UTI.  Please also place urine culture order.

## 2018-10-27 ENCOUNTER — Other Ambulatory Visit: Payer: Self-pay

## 2018-10-27 ENCOUNTER — Other Ambulatory Visit: Payer: Medicare Other

## 2018-10-27 DIAGNOSIS — M19012 Primary osteoarthritis, left shoulder: Secondary | ICD-10-CM | POA: Diagnosis not present

## 2018-10-27 DIAGNOSIS — R350 Frequency of micturition: Secondary | ICD-10-CM

## 2018-10-27 DIAGNOSIS — N3001 Acute cystitis with hematuria: Secondary | ICD-10-CM | POA: Diagnosis not present

## 2018-10-27 DIAGNOSIS — M19011 Primary osteoarthritis, right shoulder: Secondary | ICD-10-CM | POA: Diagnosis not present

## 2018-10-27 LAB — URINALYSIS, COMPLETE
Bilirubin, UA: NEGATIVE
Glucose, UA: NEGATIVE
Ketones, UA: NEGATIVE
NITRITE UA: NEGATIVE
Protein, UA: NEGATIVE
RBC, UA: NEGATIVE
Specific Gravity, UA: 1.02 (ref 1.005–1.030)
UUROB: 0.2 mg/dL (ref 0.2–1.0)
pH, UA: 5.5 (ref 5.0–7.5)

## 2018-10-27 LAB — MICROSCOPIC EXAMINATION

## 2018-10-29 ENCOUNTER — Other Ambulatory Visit: Payer: Self-pay | Admitting: Family Medicine

## 2018-10-29 ENCOUNTER — Telehealth: Payer: Self-pay | Admitting: Family Medicine

## 2018-10-29 LAB — URINE CULTURE

## 2018-10-29 MED ORDER — CEPHALEXIN 500 MG PO CAPS: 500.0000 mg | ORAL_CAPSULE | Freq: Two times a day (BID) | ORAL | 0 refills | Status: DC

## 2018-10-29 NOTE — Telephone Encounter (Signed)
Patient aware antibiotic will be extended out 5 more days

## 2018-11-01 ENCOUNTER — Other Ambulatory Visit: Payer: Self-pay | Admitting: Family Medicine

## 2018-11-01 ENCOUNTER — Telehealth: Payer: Self-pay | Admitting: Family Medicine

## 2018-11-01 MED ORDER — CEFDINIR 300 MG PO CAPS: 300.0000 mg | ORAL_CAPSULE | Freq: Two times a day (BID) | ORAL | 0 refills | Status: AC

## 2018-11-01 NOTE — Telephone Encounter (Signed)
Patient has not taken any Keflex since 9pm 3/22.  No hallucinations at the moment.  No issues with Omnicef that she knows of

## 2018-11-01 NOTE — Telephone Encounter (Signed)
Replaced w/ 5 additional days of omnicef.

## 2018-11-01 NOTE — Telephone Encounter (Signed)
Patient aware.

## 2018-11-01 NOTE — Telephone Encounter (Signed)
If she is having issues with the Keflex, stop taking the medication. Is she still having hallucinations? Did she do ok with Omnicef?  This is what we sent in previously.

## 2018-11-05 ENCOUNTER — Other Ambulatory Visit: Payer: Self-pay | Admitting: *Deleted

## 2018-11-05 ENCOUNTER — Telehealth: Payer: Self-pay | Admitting: *Deleted

## 2018-11-05 DIAGNOSIS — E039 Hypothyroidism, unspecified: Secondary | ICD-10-CM

## 2018-11-05 DIAGNOSIS — E785 Hyperlipidemia, unspecified: Secondary | ICD-10-CM

## 2018-11-05 DIAGNOSIS — K219 Gastro-esophageal reflux disease without esophagitis: Secondary | ICD-10-CM

## 2018-11-05 DIAGNOSIS — F419 Anxiety disorder, unspecified: Secondary | ICD-10-CM

## 2018-11-05 DIAGNOSIS — I10 Essential (primary) hypertension: Secondary | ICD-10-CM

## 2018-11-05 MED ORDER — ATORVASTATIN CALCIUM 40 MG PO TABS: 40.0000 mg | ORAL_TABLET | Freq: Every day | ORAL | 0 refills | Status: DC

## 2018-11-05 MED ORDER — DULOXETINE HCL 60 MG PO CPEP: 60.0000 mg | ORAL_CAPSULE | Freq: Every day | ORAL | 0 refills | Status: DC

## 2018-11-05 MED ORDER — OMEPRAZOLE 40 MG PO CPDR: 40.0000 mg | DELAYED_RELEASE_CAPSULE | Freq: Every day | ORAL | 1 refills | Status: DC

## 2018-11-05 MED ORDER — AMLODIPINE BESYLATE 10 MG PO TABS: 10.0000 mg | ORAL_TABLET | Freq: Every day | ORAL | 1 refills | Status: DC

## 2018-11-05 MED ORDER — DICLOFENAC SODIUM 50 MG PO TBEC: DELAYED_RELEASE_TABLET | ORAL | 1 refills | Status: DC

## 2018-11-05 MED ORDER — LEVOTHYROXINE SODIUM 50 MCG PO TABS: ORAL_TABLET | ORAL | 1 refills | Status: DC

## 2018-11-05 MED ORDER — RALOXIFENE HCL 60 MG PO TABS: 60.0000 mg | ORAL_TABLET | Freq: Every day | ORAL | 1 refills | Status: DC

## 2018-11-05 MED ORDER — LISINOPRIL 30 MG PO TABS: 30.0000 mg | ORAL_TABLET | Freq: Every day | ORAL | 1 refills | Status: DC

## 2018-11-05 NOTE — Telephone Encounter (Signed)
Pt aware refills sent

## 2018-11-14 ENCOUNTER — Encounter (HOSPITAL_COMMUNITY): Payer: Self-pay | Admitting: *Deleted

## 2018-11-14 ENCOUNTER — Emergency Department (HOSPITAL_COMMUNITY)
Admission: EM | Admit: 2018-11-14 | Discharge: 2018-11-15 | Disposition: A | Discharge status: Home / Self Care | Payer: Medicare Other | Attending: Emergency Medicine | Admitting: Emergency Medicine

## 2018-11-14 ENCOUNTER — Emergency Department (HOSPITAL_COMMUNITY): Payer: Medicare Other

## 2018-11-14 ENCOUNTER — Other Ambulatory Visit: Payer: Self-pay

## 2018-11-14 DIAGNOSIS — I959 Hypotension, unspecified: Secondary | ICD-10-CM | POA: Diagnosis not present

## 2018-11-14 DIAGNOSIS — K529 Noninfective gastroenteritis and colitis, unspecified: Secondary | ICD-10-CM | POA: Diagnosis not present

## 2018-11-14 DIAGNOSIS — N183 Chronic kidney disease, stage 3 unspecified: Secondary | ICD-10-CM

## 2018-11-14 DIAGNOSIS — I129 Hypertensive chronic kidney disease with stage 1 through stage 4 chronic kidney disease, or unspecified chronic kidney disease: Secondary | ICD-10-CM | POA: Insufficient documentation

## 2018-11-14 DIAGNOSIS — R55 Syncope and collapse: Secondary | ICD-10-CM | POA: Diagnosis not present

## 2018-11-14 DIAGNOSIS — Z7982 Long term (current) use of aspirin: Secondary | ICD-10-CM | POA: Insufficient documentation

## 2018-11-14 DIAGNOSIS — Z79899 Other long term (current) drug therapy: Secondary | ICD-10-CM | POA: Diagnosis not present

## 2018-11-14 DIAGNOSIS — R531 Weakness: Secondary | ICD-10-CM | POA: Diagnosis not present

## 2018-11-14 DIAGNOSIS — R109 Unspecified abdominal pain: Secondary | ICD-10-CM | POA: Diagnosis not present

## 2018-11-14 DIAGNOSIS — R52 Pain, unspecified: Secondary | ICD-10-CM | POA: Diagnosis not present

## 2018-11-14 DIAGNOSIS — R0689 Other abnormalities of breathing: Secondary | ICD-10-CM | POA: Diagnosis not present

## 2018-11-14 LAB — CBC WITH DIFFERENTIAL/PLATELET
Abs Immature Granulocytes: 0.03 10*3/uL (ref 0.00–0.07)
Basophils Absolute: 0.1 10*3/uL (ref 0.0–0.1)
Basophils Relative: 1 %
Eosinophils Absolute: 0.2 10*3/uL (ref 0.0–0.5)
Eosinophils Relative: 4 %
HCT: 36 % (ref 36.0–46.0)
Hemoglobin: 11.2 g/dL — ABNORMAL LOW (ref 12.0–15.0)
Immature Granulocytes: 1 %
Lymphocytes Relative: 27 %
Lymphs Abs: 1.7 10*3/uL (ref 0.7–4.0)
MCH: 30.9 pg (ref 26.0–34.0)
MCHC: 31.1 g/dL (ref 30.0–36.0)
MCV: 99.2 fL (ref 80.0–100.0)
Monocytes Absolute: 0.6 10*3/uL (ref 0.1–1.0)
Monocytes Relative: 10 %
Neutro Abs: 3.8 10*3/uL (ref 1.7–7.7)
Neutrophils Relative %: 57 %
Platelets: 196 10*3/uL (ref 150–400)
RBC: 3.63 MIL/uL — ABNORMAL LOW (ref 3.87–5.11)
RDW: 15 % (ref 11.5–15.5)
WBC: 6.5 10*3/uL (ref 4.0–10.5)
nRBC: 0 % (ref 0.0–0.2)

## 2018-11-14 LAB — URINALYSIS, ROUTINE W REFLEX MICROSCOPIC
Bacteria, UA: NONE SEEN
Bilirubin Urine: NEGATIVE
Glucose, UA: NEGATIVE mg/dL
Hgb urine dipstick: NEGATIVE
Ketones, ur: NEGATIVE mg/dL
Leukocytes,Ua: NEGATIVE
Nitrite: NEGATIVE
Protein, ur: 100 mg/dL — AB
Specific Gravity, Urine: 1.019 (ref 1.005–1.030)
pH: 5 (ref 5.0–8.0)

## 2018-11-14 LAB — COMPREHENSIVE METABOLIC PANEL
ALT: 37 U/L (ref 0–44)
AST: 25 U/L (ref 15–41)
Albumin: 3.5 g/dL (ref 3.5–5.0)
Alkaline Phosphatase: 67 U/L (ref 38–126)
Anion gap: 7 (ref 5–15)
BUN: 39 mg/dL — ABNORMAL HIGH (ref 8–23)
CO2: 23 mmol/L (ref 22–32)
Calcium: 9.4 mg/dL (ref 8.9–10.3)
Chloride: 111 mmol/L (ref 98–111)
Creatinine, Ser: 1.43 mg/dL — ABNORMAL HIGH (ref 0.44–1.00)
GFR calc Af Amer: 41 mL/min — ABNORMAL LOW (ref >60)
GFR calc non Af Amer: 35 mL/min — ABNORMAL LOW (ref >60)
Glucose, Bld: 156 mg/dL — ABNORMAL HIGH (ref 70–99)
Potassium: 3.9 mmol/L (ref 3.5–5.1)
Sodium: 141 mmol/L (ref 135–145)
Total Bilirubin: 0.2 mg/dL — ABNORMAL LOW (ref 0.3–1.2)
Total Protein: 6.2 g/dL — ABNORMAL LOW (ref 6.5–8.1)

## 2018-11-14 LAB — LIPASE, BLOOD: Lipase: 36 U/L (ref 11–51)

## 2018-11-14 MED ORDER — SODIUM CHLORIDE 0.9 % IV BOLUS
1000.0000 mL | Freq: Once | INTRAVENOUS | Status: AC
  Administered 2018-11-14: 22:00:00 1000 mL via INTRAVENOUS

## 2018-11-14 MED ORDER — ALIGN PREBIOTIC-PROBIOTIC 5-1.25 MG-GM PO CHEW: 1.0000 | CHEWABLE_TABLET | Freq: Every day | ORAL | 0 refills | Status: DC

## 2018-11-14 MED ORDER — HYDROCODONE-ACETAMINOPHEN 5-325 MG PO TABS: 1.0000 | ORAL_TABLET | ORAL | 0 refills | Status: DC | PRN

## 2018-11-14 MED ORDER — IOHEXOL 300 MG/ML  SOLN
75.0000 mL | Freq: Once | INTRAMUSCULAR | Status: AC | PRN
  Administered 2018-11-14: 23:00:00 75 mL via INTRAVENOUS

## 2018-11-14 MED ORDER — ONDANSETRON 4 MG PO TBDP: 4.0000 mg | ORAL_TABLET | Freq: Three times a day (TID) | ORAL | 0 refills | Status: DC | PRN

## 2018-11-14 MED ORDER — ONDANSETRON HCL 4 MG/2ML IJ SOLN
4.0000 mg | Freq: Once | INTRAMUSCULAR | Status: AC
  Administered 2018-11-14: 22:00:00 4 mg via INTRAVENOUS
  Filled 2018-11-14: qty 2

## 2018-11-14 MED ORDER — HYDROCODONE-ACETAMINOPHEN 5-325 MG PO TABS
1.0000 | ORAL_TABLET | Freq: Once | ORAL | Status: AC
  Administered 2018-11-14: 1 via ORAL
  Filled 2018-11-14: qty 1

## 2018-11-14 MED ORDER — METRONIDAZOLE 500 MG PO TABS
500.0000 mg | ORAL_TABLET | Freq: Once | ORAL | Status: AC
  Administered 2018-11-14: 500 mg via ORAL
  Filled 2018-11-14: qty 1

## 2018-11-14 MED ORDER — AMOXICILLIN-POT CLAVULANATE 875-125 MG PO TABS: 1.0000 | ORAL_TABLET | Freq: Two times a day (BID) | ORAL | 0 refills | Status: DC

## 2018-11-14 MED ORDER — AMOXICILLIN-POT CLAVULANATE 875-125 MG PO TABS: 1.0000 | ORAL_TABLET | Freq: Once | ORAL | Status: DC

## 2018-11-14 MED ORDER — METRONIDAZOLE 500 MG PO TABS: 500.0000 mg | ORAL_TABLET | Freq: Two times a day (BID) | ORAL | 0 refills | Status: DC

## 2018-11-14 NOTE — ED Notes (Signed)
[place] [phone]

## 2018-11-14 NOTE — ED Triage Notes (Signed)
Pt c/o right lower abd pain that started today, had to have bowel movement upon arrival to er, denies any n/v,

## 2018-11-14 NOTE — ED Notes (Signed)
Patient does not sign for herself and is unable to sign discharge papers

## 2018-11-14 NOTE — ED Notes (Signed)
To CT

## 2018-11-14 NOTE — ED Notes (Signed)
POA Mariel Sleet reports that   Pt will need to be returned home by ambulance  Any Questions Call: [phone]  Please call with disposition and let him know when she is returning or if she is to be admitted please

## 2018-11-14 NOTE — ED Provider Notes (Signed)
[facility] EMERGENCY DEPARTMENT Provider Note   CSN: [id number] Arrival date & time: 11/14/18  1959    History   Chief Complaint Chief Complaint  Patient presents with   Abdominal Pain    HPI Tamara Stein is a 78 y.o. female.     Pt presents to the ED today with abdominal pain and frequent urination.  Pt has a hx of UTIs.  Pt denies sob or fever.     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[place])    Chronic kidney disease    Chronic knee pain    Depression    GERD (gastroesophageal reflux disease)    Hyperlipidemia    Hypertension    Thyroid disease     Patient Active Problem List   Diagnosis Date Noted   Internal impingement of left shoulder 12/24/2017   Anxiety 04/20/2017   Cerebellar dysfunction 04/20/2017   Incontinence of urine 04/20/2017   UTI (urinary tract infection) 07/15/2016   Abdominal pain 07/15/2016   Colitis 07/15/2016   Syncope and collapse 04/26/2016   Lower urinary tract infectious disease 04/26/2016   Fall    Daytime somnolence 03/10/2016   Diplopia 03/10/2016   Insomnia 03/10/2016   Sleep apnea in adult 03/10/2016   Imbalance 03/07/2016   Migraine 03/07/2016   Tremor 03/07/2016   Pain of both shoulder joints 02/29/2016   Arthritis 09/24/2015   Constipation 06/05/2015   Depression 06/05/2015   Hypothyroid 06/05/2015   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      Home Medications    Prior to  Admission medications   Medication Sig Start Date End Date Taking? Authorizing Provider  amLODipine (NORVASC) 10 MG tablet Take 1 tablet (10 mg total) by mouth daily. for blood pressure 11/05/18   Ronnie Doss M, DO  aspirin EC 81 MG tablet Take 81 mg by mouth every morning.     [provider]  atorvastatin (LIPITOR) 40 MG tablet Take 1 tablet (40 mg total) by mouth at bedtime. 11/05/18   Janora Norlander, DO  Bacillus Coagulans-Inulin (ALIGN PREBIOTIC-PROBIOTIC) 5-1.25 MG-GM CHEW Chew 1 tablet by mouth daily. 11/14/18   Isla Pence, MD  Calcium Carbonate-Vitamin D (CALTRATE 600+D PO) Take 1 tablet by mouth daily.    [provider]  cetirizine (ZYRTEC) 10 MG chewable tablet Chew 10 mg by mouth daily.    [provider]  diclofenac (VOLTAREN) 50 MG EC tablet TAKE 1 TABLET BY MOUTH (50MG TOTAL) THREE TIMES DAILY 11/05/18   Ronnie Doss M, DO  dicyclomine (BENTYL) 10 MG capsule Take 1 capsule (10 mg total) by mouth 3 (three) times daily as needed for spasms. 10/14/17   Eustaquio Maize, MD  docusate sodium (COLACE) 100 MG capsule Take 1 capsule (100 mg total) by mouth 2 (two) times daily. 04/20/17   Eustaquio Maize, MD  DULoxetine (CYMBALTA) 60 MG capsule Take 1 capsule (60 mg  total) by mouth daily. 11/05/18   Janora Norlander, DO  ferrous sulfate 325 (65 FE) MG tablet Take 325 mg by mouth daily with breakfast. Reported on 02/29/2016    [provider]  fluticasone (FLONASE) 50 MCG/ACT nasal spray Place 2 sprays into both nostrils daily as needed for allergies. 10/20/17   Eustaquio Maize, MD  furosemide (LASIX) 20 MG tablet Take 1 tablet (20 mg total) by mouth daily as needed. As needed for swelling 08/25/17   Eustaquio Maize, MD  HYDROcodone-acetaminophen (NORCO/VICODIN) 5-325 MG tablet Take 1 tablet by mouth every 4 (four) hours as needed. 11/14/18   Isla Pence, MD  levothyroxine (SYNTHROID, LEVOTHROID) 50 MCG tablet Once daily 11/05/18   Ronnie Doss M, DO  lisinopril (PRINIVIL,ZESTRIL) 30 MG tablet Take 1 tablet (30 mg total) by mouth daily. 11/05/18   Janora Norlander, DO  meclizine (ANTIVERT) 25 MG tablet Take 25 mg by mouth 3 (three) times daily as needed for dizziness.    [provider]  metroNIDAZOLE (FLAGYL) 500 MG tablet Take 1 tablet (500 mg total) by mouth 2 (two) times daily. 11/14/18   Isla Pence, MD  Multiple Vitamin (MULTIVITAMIN WITH MINERALS) TABS tablet Take 1 tablet by mouth daily.    [provider]  omeprazole (PRILOSEC) 40 MG capsule Take 1 capsule (40 mg total) by mouth daily. 11/05/18   Janora Norlander, DO  ondansetron (ZOFRAN ODT) 4 MG disintegrating tablet Take 1 tablet (4 mg total) by mouth every 8 (eight) hours as needed. 11/14/18   Isla Pence, MD  ondansetron (ZOFRAN) 4 MG tablet Take 1 tablet (4 mg total) by mouth every 6 (six) hours. 03/05/18   Nat Christen, MD  raloxifene (EVISTA) 60 MG tablet Take 1 tablet (60 mg total) by mouth daily. 11/05/18   Janora Norlander, DO  traMADol (ULTRAM) 50 MG tablet Take 0.5-1 tablets (25-50 mg total) by mouth every 12 (twelve) hours as needed for severe pain. 06/15/18   Janora Norlander, DO    Family History Family History  Problem Relation Age of Onset   Arthritis Sister    Hyperlipidemia Sister    Hypertension Sister    Cancer Brother    Diabetes Brother    Heart disease Brother     Social History Social History   Tobacco Use   Smoking status: Never Smoker   Smokeless tobacco: Never Used  Substance Use Topics   Alcohol use: No   Drug use: No     Allergies   Sulfa antibiotics; Codeine; Doxycycline; Erythromycin; Ibuprofen; Levaquin [levofloxacin]; and Penicillins   Review of Systems Review of Systems  Gastrointestinal: Positive for abdominal pain.  Genitourinary: Positive for dysuria.  All other systems reviewed and are negative.    Physical Exam Updated Vital Signs BP 135/62    Pulse 88    Temp 97.6  F (36.4 C) (Oral)    Resp 16    Ht 5' 2"  (1.575 m)    Wt 90.7 kg    SpO2 97%    BMI 36.58 kg/m   Physical Exam Vitals signs and nursing note reviewed.  Constitutional:      Appearance: She is well-developed.  HENT:     Head: Normocephalic and atraumatic.     Mouth/Throat:     Mouth: Mucous membranes are moist.     Pharynx: Oropharynx is clear.  Eyes:     Extraocular Movements: Extraocular movements intact.     Pupils: Pupils are equal, round, and  reactive to light.  Cardiovascular:     Rate and Rhythm: Normal rate and regular rhythm.  Pulmonary:     Effort: Pulmonary effort is normal.     Breath sounds: Normal breath sounds.  Abdominal:     General: Abdomen is flat. Bowel sounds are normal.     Palpations: Abdomen is soft.     Tenderness: There is abdominal tenderness in the suprapubic area.  Skin:    Capillary Refill: Capillary refill takes less than 2 seconds.  Neurological:     General: No focal deficit present.     Mental Status: She is alert and oriented to person, place, and time.  Psychiatric:        Mood and Affect: Mood normal.        Behavior: Behavior normal.      ED Treatments / Results  Labs (all labs ordered are listed, but only abnormal results are displayed) Labs Reviewed  CBC WITH DIFFERENTIAL/PLATELET - Abnormal; Notable for the following components:      Result Value   RBC 3.63 (*)    Hemoglobin 11.2 (*)    All other components within normal limits  COMPREHENSIVE METABOLIC PANEL - Abnormal; Notable for the following components:   Glucose, Bld 156 (*)    BUN 39 (*)    Creatinine, Ser 1.43 (*)    Total Protein 6.2 (*)    Total Bilirubin 0.2 (*)    GFR calc non Af Amer 35 (*)    GFR calc Af Amer 41 (*)    All other components within normal limits  URINALYSIS, ROUTINE W REFLEX MICROSCOPIC - Abnormal; Notable for the following components:   Color, Urine AMBER (*)    APPearance CLOUDY (*)    Protein, ur 100 (*)    All other components within  normal limits  URINE CULTURE  LIPASE, BLOOD    EKG None  Radiology Ct Abdomen Pelvis W Contrast  Result Date: 11/14/2018 CLINICAL DATA:  Abdominal pain EXAM: CT ABDOMEN AND PELVIS WITH CONTRAST TECHNIQUE: Multidetector CT imaging of the abdomen and pelvis was performed using the standard protocol following bolus administration of intravenous contrast. CONTRAST:  31m OMNIPAQUE IOHEXOL 300 MG/ML  SOLN COMPARISON:  CT abdomen pelvis 07/15/2016 FINDINGS: LOWER CHEST: There is no basilar pleural or apical pericardial effusion. HEPATOBILIARY: The hepatic contours and density are normal. There is no intra- or extrahepatic biliary dilatation. Status post cholecystectomy. PANCREAS: The pancreatic parenchymal contours are normal and there is no ductal dilatation. There is no peripancreatic fluid collection. SPLEEN: 2.5 cm hypodense focus in the spleen. ADRENALS/URINARY TRACT: --Adrenal glands: Normal. --Right kidney/ureter: No hydronephrosis, nephroureterolithiasis, perinephric stranding or solid renal mass. Lobulated contour. --Left kidney/ureter: No hydronephrosis, nephroureterolithiasis, perinephric stranding or solid renal mass. Lobulated contour. --Urinary bladder: Normal for degree of distention STOMACH/BOWEL: --Stomach/Duodenum: There is a large hiatal hernia, unchanged. --Small bowel: No dilatation or other focal abnormality. --Colon: There is mild inflammatory stranding adjacent to the distal descending colon. --Appendix: Not visualized. No right lower quadrant inflammation or free fluid. VASCULAR/LYMPHATIC: There is aortic atherosclerosis without hemodynamically significant stenosis. The portal vein, splenic vein, superior mesenteric vein and IVC are patent. No abdominal or pelvic lymphadenopathy. REPRODUCTIVE: 4.1 cm left adnexal cyst is unchanged. MUSCULOSKELETAL. Chronic L4 compression deformity. OTHER: None. IMPRESSION: 1. Mild inflammatory stranding adjacent to the distal descending colon, which may  indicate early colitis. The appearance is similar to the study of 07/15/16. 2. Unchanged large hiatal hernia. 3. Unchanged left adnexal cyst, measuring up to 4.1  cm. This is almost certainly benign, but follow up ultrasound is recommended in 1 year according to the Society of Radiologists in Ultrasound 2010 Consensus Conference Statement (D Clovis Riley et al. Management of Asymptomatic Ovarian and Other Adnexal Cysts Imaged at [place]: Society of Radiologists in [place] Statement 2010. Radiology 256 (Sept 2010): 943-954.). 4.  Aortic atherosclerosis (ICD10-I70.0). Electronically Signed   By: Ulyses Jarred M.D.   On: 11/14/2018 23:20    Procedures Procedures (including critical care time)  Medications Ordered in ED Medications  HYDROcodone-acetaminophen (NORCO/VICODIN) 5-325 MG per tablet 1 tablet (has no administration in time range)  metroNIDAZOLE (FLAGYL) tablet 500 mg (has no administration in time range)  sodium chloride 0.9 % bolus 1,000 mL (0 mLs Intravenous Stopped 11/14/18 2335)  ondansetron (ZOFRAN) injection 4 mg (4 mg Intravenous Given 11/14/18 2203)  iohexol (OMNIPAQUE) 300 MG/ML solution 75 mL (75 mLs Intravenous Contrast Given 11/14/18 2250)     Initial Impression / Assessment and Plan / ED Course  I have reviewed the triage vital signs and the nursing notes.  Pertinent labs & imaging results that were available during my care of the patient were reviewed by me and considered in my medical decision making (see chart for details).     Pt has evidence of colitis on CT scan.  She will be d/c home with flagyl as it may be from cdiff since she's been on recent abx.  She is also d/c home with align pre/probiotic. She knows to return if worse and to f/u with pcp.  Final Clinical Impressions(s) / ED Diagnoses   Final diagnoses:  Colitis  CKD (chronic kidney disease) stage 3, GFR 30-59 ml/min [facility])    ED Discharge Orders         Ordered    amoxicillin-clavulanate  (AUGMENTIN) 875-125 MG tablet  Every 12 hours,   Status:  Discontinued     11/14/18 2329    Bacillus Coagulans-Inulin (ALIGN PREBIOTIC-PROBIOTIC) 5-1.25 MG-GM CHEW  Daily     11/14/18 2329    metroNIDAZOLE (FLAGYL) 500 MG tablet  2 times daily     11/14/18 2334    HYDROcodone-acetaminophen (NORCO/VICODIN) 5-325 MG tablet  Every 4 hours PRN     11/14/18 2334    ondansetron (ZOFRAN ODT) 4 MG disintegrating tablet  Every 8 hours PRN     11/14/18 2334           Isla Pence, MD 11/14/18 2337

## 2018-11-14 NOTE — ED Notes (Signed)
Pt reports abd pain since this am  Also a headache   Reported needed a bm upon arrival - on Bpan without results  Her abd is diffusely tender to palpation

## 2018-11-15 DIAGNOSIS — R1084 Generalized abdominal pain: Secondary | ICD-10-CM | POA: Diagnosis not present

## 2018-11-15 DIAGNOSIS — I10 Essential (primary) hypertension: Secondary | ICD-10-CM | POA: Diagnosis not present

## 2018-11-15 DIAGNOSIS — R0902 Hypoxemia: Secondary | ICD-10-CM | POA: Diagnosis not present

## 2018-11-15 DIAGNOSIS — Z743 Need for continuous supervision: Secondary | ICD-10-CM | POA: Diagnosis not present

## 2018-11-15 DIAGNOSIS — N183 Chronic kidney disease, stage 3 (moderate): Secondary | ICD-10-CM | POA: Diagnosis not present

## 2018-11-15 MED ORDER — ONDANSETRON 8 MG PO TBDP
8.0000 mg | ORAL_TABLET | Freq: Once | ORAL | Status: AC
  Administered 2018-11-15: 8 mg via ORAL
  Filled 2018-11-15: qty 1

## 2018-11-15 NOTE — ED Notes (Signed)
Spoke with pt's brother in law who reported that pt does not walk, uses a motorized wheelchair and that the family helps to assist with transfers. Brother in law advised that pt uses ems for transport to residence from er, [place] called for transport,

## 2018-11-15 NOTE — ED Notes (Signed)
Patient waiting on EMS transport home. [facility] contacted about transport.

## 2018-11-15 NOTE — ED Notes (Signed)
Patient given zofran for nausea. Patient had one episode of active vomiting.

## 2018-11-16 LAB — URINE CULTURE: Culture: NO GROWTH

## 2018-11-22 ENCOUNTER — Ambulatory Visit (INDEPENDENT_AMBULATORY_CARE_PROVIDER_SITE_OTHER): Payer: Medicare Other | Admitting: Family Medicine

## 2018-11-22 ENCOUNTER — Encounter: Payer: Self-pay | Admitting: Family Medicine

## 2018-11-22 ENCOUNTER — Other Ambulatory Visit: Payer: Self-pay

## 2018-11-22 DIAGNOSIS — K5904 Chronic idiopathic constipation: Secondary | ICD-10-CM

## 2018-11-22 NOTE — Progress Notes (Signed)
Telephone visit  Subjective: [id number] PCP: Janora Norlander, DO PQZ:[id number] Tamara Stein is a 78 y.o. female calls for telephone consult today. Patient provides verbal consent for consult held via phone.  Location of patient: home Location of provider: [facility] Others present for call: friend  1. Constipation Patient reports that she is seen in the emergency department on 11/14/2018, and was sent home with antibiotics.  After further review it appears that she was diagnosed with colitis and was empirically treated with Flagyl to cover for C. difficile infection given recent antibiotic use for UTI.  She reports that abdominal pain and other symptoms have significantly improved since that time but she continues to have quite a bit of constipation.  Normally she has a bowel movement every other day but is not had a decent bowel movement in almost 7 days now.  She denies any vomiting or decreased p.o. intake but does report some nausea.  She uses MiraLAX as needed but has not been using this regularly despite constipation.  She continues to have flatus and small stool output but nothing significant.  No fevers.  No hematochezia or melena.   ROS: Per HPI  Allergies  Allergen Reactions  . Sulfa Antibiotics Anaphylaxis    Facial swelling   . Codeine Other (See Comments)    BLACK OUTS  . Doxycycline Nausea Only  . Erythromycin Swelling  . Ibuprofen Other (See Comments)    Stomach pain, muscle spasm, GI bleeding  . Levaquin [Levofloxacin] Other (See Comments)    Insomnia/trouble breathing  . Penicillins Diarrhea    Has patient had a PCN reaction causing immediate rash, facial/tongue/throat swelling, SOB or lightheadedness with hypotension: No Has patient had a PCN reaction causing severe rash involving mucus membranes or skin necrosis: No Has patient had a PCN reaction that required hospitalization Unknown Has patient had a PCN reaction occurring within the last 10 years: No If all of the  above answers are "NO", then may proceed with Cephalosporin use.    Past Medical History:  Diagnosis Date  . Allergy   . Anxiety   . Cataract   . 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 Chronic kidney disease   . Chronic knee pain   . Depression   . GERD (gastroesophageal reflux disease)   . Hyperlipidemia   . Hypertension   . Thyroid disease     Current Outpatient Medications:  .  amLODipine (NORVASC) 10 MG tablet, Take 1 tablet (10 mg total) by mouth daily. for blood pressure, Disp: 90 tablet, Rfl: 1 .  aspirin EC 81 MG tablet, Take 81 mg by mouth every morning. , Disp: , Rfl:  .  atorvastatin (LIPITOR) 40 MG tablet, Take 1 tablet (40 mg total) by mouth at bedtime., Disp: 90 tablet, Rfl: 0 .  Bacillus Coagulans-Inulin (ALIGN PREBIOTIC-PROBIOTIC) 5-1.25 MG-GM CHEW, Chew 1 tablet by mouth daily., Disp: 30 tablet, Rfl: 0 .  Calcium Carbonate-Vitamin D (CALTRATE 600+D PO), Take 1 tablet by mouth daily., Disp: , Rfl:  .  cetirizine (ZYRTEC) 10 MG chewable tablet, Chew 10 mg by mouth daily., Disp: , Rfl:  .  diclofenac (VOLTAREN) 50 MG EC tablet, TAKE 1 TABLET BY MOUTH (50MG TOTAL) THREE TIMES DAILY, Disp: 270 tablet, Rfl: 1 .  dicyclomine (BENTYL) 10 MG capsule, Take 1 capsule (10 mg total) by mouth 3 (three) times daily as needed for spasms., Disp: 90 capsule, Rfl: 1 .  docusate sodium (COLACE) 100 MG capsule, Take 1 capsule (100 mg total) by mouth 2 (two)  times daily., Disp: 60 capsule, Rfl: 0 .  DULoxetine (CYMBALTA) 60 MG capsule, Take 1 capsule (60 mg total) by mouth daily., Disp: 90 capsule, Rfl: 0 .  ferrous sulfate 325 (65 FE) MG tablet, Take 325 mg by mouth daily with breakfast. Reported on 02/29/2016, Disp: , Rfl:  .  fluticasone (FLONASE) 50 MCG/ACT nasal spray, Place 2 sprays into both nostrils daily as needed for allergies., Disp: 48 g, Rfl: 1 .  furosemide (LASIX) 20 MG tablet, Take 1 tablet (20 mg total) by mouth daily as needed. As needed for swelling, Disp: 90 tablet, Rfl: 1 .   HYDROcodone-acetaminophen (NORCO/VICODIN) 5-325 MG tablet, Take 1 tablet by mouth every 4 (four) hours as needed., Disp: 10 tablet, Rfl: 0 .  levothyroxine (SYNTHROID, LEVOTHROID) 50 MCG tablet, Once daily, Disp: 90 tablet, Rfl: 1 .  lisinopril (PRINIVIL,ZESTRIL) 30 MG tablet, Take 1 tablet (30 mg total) by mouth daily., Disp: 90 tablet, Rfl: 1 .  meclizine (ANTIVERT) 25 MG tablet, Take 25 mg by mouth 3 (three) times daily as needed for dizziness., Disp: , Rfl:  .  metroNIDAZOLE (FLAGYL) 500 MG tablet, Take 1 tablet (500 mg total) by mouth 2 (two) times daily., Disp: 14 tablet, Rfl: 0 .  Multiple Vitamin (MULTIVITAMIN WITH MINERALS) TABS tablet, Take 1 tablet by mouth daily., Disp: , Rfl:  .  omeprazole (PRILOSEC) 40 MG capsule, Take 1 capsule (40 mg total) by mouth daily., Disp: 90 capsule, Rfl: 1 .  ondansetron (ZOFRAN ODT) 4 MG disintegrating tablet, Take 1 tablet (4 mg total) by mouth every 8 (eight) hours as needed., Disp: 10 tablet, Rfl: 0 .  ondansetron (ZOFRAN) 4 MG tablet, Take 1 tablet (4 mg total) by mouth every 6 (six) hours., Disp: 12 tablet, Rfl: 0 .  raloxifene (EVISTA) 60 MG tablet, Take 1 tablet (60 mg total) by mouth daily., Disp: 90 tablet, Rfl: 1 .  traMADol (ULTRAM) 50 MG tablet, Take 0.5-1 tablets (25-50 mg total) by mouth every 12 (twelve) hours as needed for severe pain., Disp: 10 tablet, Rfl: 0  Assessment/ Plan: 78 y.o. female   1. Chronic idiopathic constipation Likely compounded by recent medications.  I reviewed her recent emergency department note and imaging studies.  I have advised her to use 1 capful of MiraLAX mixed in 8 ounces of fluid and drink twice daily until she is having soft bowel movements 1-3 times per day.  She will call a pack to me in the next 24 to 48 hours to let me know if this is working.  We discussed signs and symptoms of a bowel obstruction.  At this time, she is passing flatus and having small bowel movements I do not think that she has a bowel  obstruction at this time.  I have encouraged her to continue p.o. intake/specifically fluids.  We discussed reasons for emergent evaluation and she voiced understanding.   Start time: 9:14am End time: 9:24am  Total time spent on patient care (including telephone call/ virtual visit): 15 minutes  [place], [place] [phone]

## 2018-12-22 DIAGNOSIS — R29898 Other symptoms and signs involving the musculoskeletal system: Secondary | ICD-10-CM | POA: Diagnosis not present

## 2018-12-27 ENCOUNTER — Other Ambulatory Visit: Payer: Self-pay

## 2018-12-27 ENCOUNTER — Encounter: Payer: Self-pay | Admitting: Family Medicine

## 2018-12-27 ENCOUNTER — Ambulatory Visit (INDEPENDENT_AMBULATORY_CARE_PROVIDER_SITE_OTHER): Payer: Medicare Other | Admitting: Family Medicine

## 2018-12-27 DIAGNOSIS — K529 Noninfective gastroenteritis and colitis, unspecified: Secondary | ICD-10-CM | POA: Diagnosis not present

## 2018-12-27 MED ORDER — METRONIDAZOLE 500 MG PO TABS: 500.0000 mg | ORAL_TABLET | Freq: Two times a day (BID) | ORAL | 0 refills | Status: DC

## 2018-12-27 NOTE — Progress Notes (Signed)
Virtual Visit via telephone Note  I connected with Tamara Stein on 12/27/18 at 1314 by telephone and verified that I am speaking with the correct person using two identifiers. Tamara Stein is currently located at home and No other family are currently with her during visit. The provider, Fransisca Kaufmann Dettinger, MD is located in their office at time of visit.  Call ended at 1424  I discussed the limitations, risks, security and privacy concerns of performing an evaluation and management service by telephone and the availability of in person appointments. I also discussed with the patient that there may be a patient responsible charge related to this service. The patient expressed understanding and agreed to proceed.   History and Present Illness: Patient was seen in the ED for abdominal pain and diarrhea and was treated for colitis on 11/14/2018 and took medicine and it got better.  She then went back to normal.  Just 3 days ago she started to have diarrhea that is dark black.  Patient denies any fevers or chills or body aches.  Patient complains of periumbilical abdominal pain.  She has taken pepto bismol.  She was given metronidazole last month and it resolved. Patient is still making urine and denies dehydration  No diagnosis found.  Outpatient Encounter Medications as of 12/27/2018  Medication Sig  . amLODipine (NORVASC) 10 MG tablet Take 1 tablet (10 mg total) by mouth daily. for blood pressure  . aspirin EC 81 MG tablet Take 81 mg by mouth every morning.   Marland Kitchen atorvastatin (LIPITOR) 40 MG tablet Take 1 tablet (40 mg total) by mouth at bedtime.  . Bacillus Coagulans-Inulin (ALIGN PREBIOTIC-PROBIOTIC) 5-1.25 MG-GM CHEW Chew 1 tablet by mouth daily.  . Calcium Carbonate-Vitamin D (CALTRATE 600+D PO) Take 1 tablet by mouth daily.  . cetirizine (ZYRTEC) 10 MG chewable tablet Chew 10 mg by mouth daily.  . diclofenac (VOLTAREN) 50 MG EC tablet TAKE 1 TABLET BY MOUTH (50MG TOTAL) THREE TIMES  DAILY  . dicyclomine (BENTYL) 10 MG capsule Take 1 capsule (10 mg total) by mouth 3 (three) times daily as needed for spasms.  Marland Kitchen docusate sodium (COLACE) 100 MG capsule Take 1 capsule (100 mg total) by mouth 2 (two) times daily.  . DULoxetine (CYMBALTA) 60 MG capsule Take 1 capsule (60 mg total) by mouth daily.  . ferrous sulfate 325 (65 FE) MG tablet Take 325 mg by mouth daily with breakfast. Reported on 02/29/2016  . fluticasone (FLONASE) 50 MCG/ACT nasal spray Place 2 sprays into both nostrils daily as needed for allergies.  . furosemide (LASIX) 20 MG tablet Take 1 tablet (20 mg total) by mouth daily as needed. As needed for swelling  . HYDROcodone-acetaminophen (NORCO/VICODIN) 5-325 MG tablet Take 1 tablet by mouth every 4 (four) hours as needed.  Marland Kitchen levothyroxine (SYNTHROID, LEVOTHROID) 50 MCG tablet Once daily  . lisinopril (PRINIVIL,ZESTRIL) 30 MG tablet Take 1 tablet (30 mg total) by mouth daily.  . meclizine (ANTIVERT) 25 MG tablet Take 25 mg by mouth 3 (three) times daily as needed for dizziness.  . metroNIDAZOLE (FLAGYL) 500 MG tablet Take 1 tablet (500 mg total) by mouth 2 (two) times daily.  . Multiple Vitamin (MULTIVITAMIN WITH MINERALS) TABS tablet Take 1 tablet by mouth daily.  Marland Kitchen omeprazole (PRILOSEC) 40 MG capsule Take 1 capsule (40 mg total) by mouth daily.  . ondansetron (ZOFRAN ODT) 4 MG disintegrating tablet Take 1 tablet (4 mg total) by mouth every 8 (eight) hours as needed.  . ondansetron (  ZOFRAN) 4 MG tablet Take 1 tablet (4 mg total) by mouth every 6 (six) hours.  . raloxifene (EVISTA) 60 MG tablet Take 1 tablet (60 mg total) by mouth daily.  . traMADol (ULTRAM) 50 MG tablet Take 0.5-1 tablets (25-50 mg total) by mouth every 12 (twelve) hours as needed for severe pain.   No facility-administered encounter medications on file as of 12/27/2018.     Review of Systems  Constitutional: Negative for chills and fever.  Eyes: Negative for visual disturbance.  Respiratory:  Negative for chest tightness and shortness of breath.   Cardiovascular: Negative for chest pain and leg swelling.  Gastrointestinal: Positive for diarrhea (Dark black stools) and nausea. Negative for vomiting.  Skin: Negative for rash.  Neurological: Negative for light-headedness and headaches.  Psychiatric/Behavioral: Negative for agitation and behavioral problems.  All other systems reviewed and are negative.   Observations/Objective: Patient sounds comfortable and in no acute distress  Assessment and Plan: Problem List Items Addressed This Visit      Digestive   Colitis - Primary   Relevant Medications   metroNIDAZOLE (FLAGYL) 500 MG tablet       Follow Up Instructions: As needed    I discussed the assessment and treatment plan with the patient. The patient was provided an opportunity to ask questions and all were answered. The patient agreed with the plan and demonstrated an understanding of the instructions.   The patient was advised to call back or seek an in-person evaluation if the symptoms worsen or if the condition fails to improve as anticipated.  The above assessment and management plan was discussed with the patient. The patient verbalized understanding of and has agreed to the management plan. Patient is aware to call the clinic if symptoms persist or worsen. Patient is aware when to return to the clinic for a follow-up visit. Patient educated on when it is appropriate to go to the emergency department.    I provided 10 minutes of non-face-to-face time during this encounter.    Worthy Rancher, MD

## 2019-01-06 DIAGNOSIS — M13862 Other specified arthritis, left knee: Secondary | ICD-10-CM | POA: Diagnosis not present

## 2019-01-06 DIAGNOSIS — M13861 Other specified arthritis, right knee: Secondary | ICD-10-CM | POA: Diagnosis not present

## 2019-01-10 ENCOUNTER — Ambulatory Visit (INDEPENDENT_AMBULATORY_CARE_PROVIDER_SITE_OTHER): Payer: Medicare Other | Admitting: Family Medicine

## 2019-01-10 ENCOUNTER — Other Ambulatory Visit: Payer: Self-pay

## 2019-01-10 ENCOUNTER — Encounter: Payer: Self-pay | Admitting: Family Medicine

## 2019-01-10 DIAGNOSIS — N3090 Cystitis, unspecified without hematuria: Secondary | ICD-10-CM | POA: Diagnosis not present

## 2019-01-10 DIAGNOSIS — R300 Dysuria: Secondary | ICD-10-CM | POA: Diagnosis not present

## 2019-01-10 MED ORDER — CEPHALEXIN 500 MG PO CAPS: 500.0000 mg | ORAL_CAPSULE | Freq: Three times a day (TID) | ORAL | 0 refills | Status: AC

## 2019-01-10 NOTE — Progress Notes (Signed)
Virtual Visit via telephone Note Due to COVID-19, visit is conducted virtually and was requested by patient. This visit type was conducted due to national recommendations for restrictions regarding the COVID-19 Pandemic (e.g. social distancing) in an effort to limit this patient's exposure and mitigate transmission in our community. All issues noted in this document were discussed and addressed.  A physical exam was not performed with this format.   I connected with Tamara Stein on 01/10/19 at 0950 by telephone and verified that I am speaking with the correct person using two identifiers. Tamara Stein is currently located at home and family is currently with them during visit. The provider, Monia Pouch, FNP is located in their office at time of visit.  I discussed the limitations, risks, security and privacy concerns of performing an evaluation and management service by telephone and the availability of in person appointments. I also discussed with the patient that there may be a patient responsible charge related to this service. The patient expressed understanding and agreed to proceed.  Subjective:  Patient ID: Tamara Stein, female    DOB: 03-24-1941, 78 y.o.   MRN: [id number]  Chief Complaint:  Dysuria   HPI: Tamara Stein is a 78 y.o. female presenting on 01/10/2019 for Dysuria   Pt reports dysuria and malodorous urine. Pt states this started yesterday. She states she has burning with voiding. She denies abdominal or back pain. No hematuria, flank pain, fever, weakness, confusion, or fatigue. No vaginal symptoms.   Dysuria   This is a new problem. The current episode started yesterday. The problem has been unchanged. The quality of the pain is described as burning. The pain is at a severity of 3/10. The pain is mild. There has been no fever. She is not sexually active. There is no history of pyelonephritis. Associated symptoms include chills, frequency and urgency.  Pertinent negatives include no discharge, flank pain, hematuria, hesitancy, nausea, possible pregnancy, sweats or vomiting. She has tried nothing for the symptoms.     Relevant past medical, surgical, family, and social history reviewed and updated as indicated.  Allergies and medications reviewed and updated.   Past Medical History:  Diagnosis Date  . Allergy   . Anxiety   . Cataract   . 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 Chronic kidney disease   . Chronic knee pain   . Depression   . GERD (gastroesophageal reflux disease)   . Hyperlipidemia   . Hypertension   . Thyroid disease     Past Surgical History:  Procedure Laterality Date  . ABDOMINAL HYSTERECTOMY    . CHOLECYSTECTOMY    . MOUTH SURGERY    . RIGHT ELBOW      Social History   Socioeconomic History  . Marital status: Widowed    Spouse name: Not on file  . Number of children: Not on file  . Years of education: Not on file  . Highest education level: Not on file  Occupational History  . Not on file  Social Needs  . Financial resource strain: Not hard at all  . Food insecurity:    Worry: Never true    Inability: Never true  . Transportation needs:    Medical: Not on file    Non-medical: Not on file  Tobacco Use  . Smoking status: Never Smoker  . Smokeless tobacco: Never Used  Substance and Sexual Activity  . Alcohol use: No  . Drug use: No  . Sexual activity: Never  Lifestyle  . Physical  activity:    Days per week: Not on file    Minutes per session: Not on file  . Stress: Not on file  Relationships  . Social connections:    Talks on phone: Not on file    Gets together: Not on file    Attends religious service: Not on file    Active member of club or organization: Not on file    Attends meetings of clubs or organizations: Not on file    Relationship status: Not on file  . Intimate partner violence:    Fear of current or ex partner: Not on file    Emotionally abused: Not on file    Physically  abused: Not on file    Forced sexual activity: Not on file  Other Topics Concern  . Not on file  Social History Narrative  . Not on file    Outpatient Encounter Medications as of 01/10/2019  Medication Sig  . amLODipine (NORVASC) 10 MG tablet Take 1 tablet (10 mg total) by mouth daily. for blood pressure  . aspirin EC 81 MG tablet Take 81 mg by mouth every morning.   Marland Kitchen atorvastatin (LIPITOR) 40 MG tablet Take 1 tablet (40 mg total) by mouth at bedtime.  . Bacillus Coagulans-Inulin (ALIGN PREBIOTIC-PROBIOTIC) 5-1.25 MG-GM CHEW Chew 1 tablet by mouth daily.  . Calcium Carbonate-Vitamin D (CALTRATE 600+D PO) Take 1 tablet by mouth daily.  . cephALEXin (KEFLEX) 500 MG capsule Take 1 capsule (500 mg total) by mouth 3 (three) times daily for 7 days.  . cetirizine (ZYRTEC) 10 MG chewable tablet Chew 10 mg by mouth daily.  . diclofenac (VOLTAREN) 50 MG EC tablet TAKE 1 TABLET BY MOUTH (50MG TOTAL) THREE TIMES DAILY  . dicyclomine (BENTYL) 10 MG capsule Take 1 capsule (10 mg total) by mouth 3 (three) times daily as needed for spasms.  Marland Kitchen docusate sodium (COLACE) 100 MG capsule Take 1 capsule (100 mg total) by mouth 2 (two) times daily.  . DULoxetine (CYMBALTA) 60 MG capsule Take 1 capsule (60 mg total) by mouth daily.  . ferrous sulfate 325 (65 FE) MG tablet Take 325 mg by mouth daily with breakfast. Reported on 02/29/2016  . fluticasone (FLONASE) 50 MCG/ACT nasal spray Place 2 sprays into both nostrils daily as needed for allergies.  . furosemide (LASIX) 20 MG tablet Take 1 tablet (20 mg total) by mouth daily as needed. As needed for swelling  . HYDROcodone-acetaminophen (NORCO/VICODIN) 5-325 MG tablet Take 1 tablet by mouth every 4 (four) hours as needed.  Marland Kitchen levothyroxine (SYNTHROID, LEVOTHROID) 50 MCG tablet Once daily  . lisinopril (PRINIVIL,ZESTRIL) 30 MG tablet Take 1 tablet (30 mg total) by mouth daily.  . meclizine (ANTIVERT) 25 MG tablet Take 25 mg by mouth 3 (three) times daily as needed for  dizziness.  . metroNIDAZOLE (FLAGYL) 500 MG tablet Take 1 tablet (500 mg total) by mouth 2 (two) times daily.  . Multiple Vitamin (MULTIVITAMIN WITH MINERALS) TABS tablet Take 1 tablet by mouth daily.  Marland Kitchen omeprazole (PRILOSEC) 40 MG capsule Take 1 capsule (40 mg total) by mouth daily.  . ondansetron (ZOFRAN ODT) 4 MG disintegrating tablet Take 1 tablet (4 mg total) by mouth every 8 (eight) hours as needed.  . ondansetron (ZOFRAN) 4 MG tablet Take 1 tablet (4 mg total) by mouth every 6 (six) hours.  . raloxifene (EVISTA) 60 MG tablet Take 1 tablet (60 mg total) by mouth daily.  . traMADol (ULTRAM) 50 MG tablet Take 0.5-1 tablets (25-50  mg total) by mouth every 12 (twelve) hours as needed for severe pain.   No facility-administered encounter medications on file as of 01/10/2019.     Allergies  Allergen Reactions  . Sulfa Antibiotics Anaphylaxis    Facial swelling   . Codeine Other (See Comments)    BLACK OUTS  . Doxycycline Nausea Only  . Erythromycin Swelling  . Ibuprofen Other (See Comments)    Stomach pain, muscle spasm, GI bleeding  . Levaquin [Levofloxacin] Other (See Comments)    Insomnia/trouble breathing  . Penicillins Diarrhea    Has patient had a PCN reaction causing immediate rash, facial/tongue/throat swelling, SOB or lightheadedness with hypotension: No Has patient had a PCN reaction causing severe rash involving mucus membranes or skin necrosis: No Has patient had a PCN reaction that required hospitalization Unknown Has patient had a PCN reaction occurring within the last 10 years: No If all of the above answers are "NO", then may proceed with Cephalosporin use.     Review of Systems  Constitutional: Positive for chills. Negative for activity change, appetite change, diaphoresis, fatigue, fever and unexpected weight change.  Respiratory: Negative for cough and shortness of breath.   Cardiovascular: Negative for chest pain and palpitations.  Gastrointestinal: Negative for  abdominal pain, constipation, diarrhea, nausea and vomiting.  Genitourinary: Positive for dysuria, frequency and urgency. Negative for decreased urine volume, difficulty urinating, enuresis, flank pain, genital sores, hematuria, hesitancy, menstrual problem, pelvic pain, vaginal bleeding, vaginal discharge and vaginal pain.  Neurological: Negative for weakness.  Psychiatric/Behavioral: Negative for confusion.  All other systems reviewed and are negative.        Observations/Objective: No vital signs or physical exam, this was a telephone or virtual health encounter.  Pt alert and oriented, answers all questions appropriately, and able to speak in full sentences.    Assessment and Plan: Tamara Stein was seen today for dysuria.  Diagnoses and all orders for this visit:  Dysuria Increase water intake. Avoid bladder irritants such as caffeine.   Cystitis Reported symptoms concerning for cystitis. Will empirically treat with below. If symptoms worsen or do not improve in 4 - 5 days, will need to be evaluated. Medications as prescribed.  -     cephALEXin (KEFLEX) 500 MG capsule; Take 1 capsule (500 mg total) by mouth 3 (three) times daily for 7 days.     Follow Up Instructions: Return if symptoms worsen or fail to improve.    I discussed the assessment and treatment plan with the patient. The patient was provided an opportunity to ask questions and all were answered. The patient agreed with the plan and demonstrated an understanding of the instructions.   The patient was advised to call back or seek an in-person evaluation if the symptoms worsen or if the condition fails to improve as anticipated.  The above assessment and management plan was discussed with the patient. The patient verbalized understanding of and has agreed to the management plan. Patient is aware to call the clinic if symptoms persist or worsen. Patient is aware when to return to the clinic for a follow-up visit. Patient  educated on when it is appropriate to go to the emergency department.    I provided 15 minutes of non-face-to-face time during this encounter. The call started at 0950. The call ended at 1005. The other time was used for coordination of care.    Monia Pouch, FNP-C [facility] [address] [place],  [address] [phone]

## 2019-01-14 DIAGNOSIS — H6123 Impacted cerumen, bilateral: Secondary | ICD-10-CM | POA: Diagnosis not present

## 2019-01-19 ENCOUNTER — Other Ambulatory Visit: Payer: Self-pay | Admitting: Family Medicine

## 2019-01-19 DIAGNOSIS — Z135 Encounter for screening for eye and ear disorders: Secondary | ICD-10-CM | POA: Diagnosis not present

## 2019-01-19 DIAGNOSIS — Z961 Presence of intraocular lens: Secondary | ICD-10-CM | POA: Diagnosis not present

## 2019-01-19 DIAGNOSIS — F419 Anxiety disorder, unspecified: Secondary | ICD-10-CM

## 2019-01-19 DIAGNOSIS — E785 Hyperlipidemia, unspecified: Secondary | ICD-10-CM

## 2019-01-25 ENCOUNTER — Encounter: Payer: Self-pay | Admitting: Family

## 2019-01-25 ENCOUNTER — Ambulatory Visit (INDEPENDENT_AMBULATORY_CARE_PROVIDER_SITE_OTHER): Payer: Medicare Other | Admitting: Family

## 2019-01-25 DIAGNOSIS — R399 Unspecified symptoms and signs involving the genitourinary system: Secondary | ICD-10-CM

## 2019-01-25 LAB — URINALYSIS, COMPLETE
Bilirubin, UA: NEGATIVE
Glucose, UA: NEGATIVE
Nitrite, UA: NEGATIVE
Specific Gravity, UA: 1.025 (ref 1.005–1.030)
Urobilinogen, Ur: 0.2 mg/dL (ref 0.2–1.0)
pH, UA: 5 (ref 5.0–7.5)

## 2019-01-25 LAB — MICROSCOPIC EXAMINATION
Renal Epithel, UA: NONE SEEN /hpf
WBC, UA: >30 /hpf — AB (ref 0–5)

## 2019-01-25 MED ORDER — NITROFURANTOIN MONOHYD MACRO 100 MG PO CAPS: 100.0000 mg | ORAL_CAPSULE | Freq: Two times a day (BID) | ORAL | 0 refills | Status: DC

## 2019-01-25 NOTE — Progress Notes (Signed)
   Virtual Visit via telephone Note  I connected with Tamara Stein on 01/25/19 at 11:01 AM by telephone and verified that I am speaking with the correct person using two identifiers. Tamara Stein is currently located at home and sitter is currently with her during visit. The provider, Evelina Dun, FNP is located in their office at time of visit.  I discussed the limitations, risks, security and privacy concerns of performing an evaluation and management service by telephone and the availability of in person appointments. I also discussed with the patient that there may be a patient responsible charge related to this service. The patient expressed understanding and agreed to proceed.   History and Present Illness:  PT calls the office today for recurrent UTI. She had a televisit on 01/10/19 for a UTI and was treated with Keflex. She states her symptoms improved, but last night woke up with urinary frequency multiple times.  Urinary Frequency  This is a new problem. The current episode started yesterday. The problem occurs every urination. The problem has been gradually worsening. The pain is at a severity of 0/10. The patient is experiencing no pain. Associated symptoms include frequency, hesitancy and urgency. Pertinent negatives include no flank pain, hematuria, nausea or vomiting. She has tried increased fluids for the symptoms. The treatment provided mild relief.      Review of Systems  Gastrointestinal: Negative for nausea and vomiting.  Genitourinary: Positive for frequency, hesitancy and urgency. Negative for flank pain and hematuria.  All other systems reviewed and are negative.    Observations/Objective: No SOB or distress  Assessment and Plan: 1. UTI symptoms Since this is recurrent, patient will drop off urine before starting antibiotic Force fluids AZO over the counter X2 days RTO if symptoms worsen or do not improve  Culture pending - nitrofurantoin,  macrocrystal-monohydrate, (MACROBID) 100 MG capsule; Take 1 capsule (100 mg total) by mouth 2 (two) times daily. 1 po BId  Dispense: 10 capsule; Refill: 0 - Urinalysis, Complete - Urine Culture     I discussed the assessment and treatment plan with the patient. The patient was provided an opportunity to ask questions and all were answered. The patient agreed with the plan and demonstrated an understanding of the instructions.   The patient was advised to call back or seek an in-person evaluation if the symptoms worsen or if the condition fails to improve as anticipated.  The above assessment and management plan was discussed with the patient. The patient verbalized understanding of and has agreed to the management plan. Patient is aware to call the clinic if symptoms persist or worsen. Patient is aware when to return to the clinic for a follow-up visit. Patient educated on when it is appropriate to go to the emergency department.   Time call ended:  11:13 AM  I provided 12 minutes of non-face-to-face time during this encounter.    Evelina Dun, FNP

## 2019-01-27 ENCOUNTER — Telehealth: Payer: Self-pay | Admitting: Family Medicine

## 2019-01-27 NOTE — Telephone Encounter (Signed)
Patient aware.

## 2019-01-27 NOTE — Telephone Encounter (Signed)
Patient states that she is "burning really bad" when she uses the restroom and would like to know if she can start the antibiotic given.  Urine culture not back at this time

## 2019-01-27 NOTE — Telephone Encounter (Signed)
Yes, may take the medication

## 2019-01-30 LAB — URINE CULTURE

## 2019-01-31 ENCOUNTER — Telehealth: Payer: Self-pay | Admitting: Family

## 2019-01-31 ENCOUNTER — Other Ambulatory Visit: Payer: Self-pay | Admitting: Family

## 2019-01-31 ENCOUNTER — Telehealth: Payer: Self-pay | Admitting: Family Medicine

## 2019-01-31 MED ORDER — CEPHALEXIN 500 MG PO CAPS: 500.0000 mg | ORAL_CAPSULE | Freq: Two times a day (BID) | ORAL | 0 refills | Status: DC

## 2019-01-31 NOTE — Telephone Encounter (Signed)
See result note.  

## 2019-01-31 NOTE — Telephone Encounter (Signed)
Patient would like to know if she should get referred to Urology for ongoing UTI? Patient aware Dr. Darnell Level is out of the office today and she will he back from [place] 02/01/19.

## 2019-01-31 NOTE — Telephone Encounter (Signed)
Patient would like results from urine culture from last visit - please review and advise

## 2019-02-01 ENCOUNTER — Other Ambulatory Visit: Payer: Self-pay | Admitting: Family Medicine

## 2019-02-01 DIAGNOSIS — N390 Urinary tract infection, site not specified: Secondary | ICD-10-CM

## 2019-02-01 NOTE — Telephone Encounter (Signed)
Referral placed for recurrent UTI

## 2019-02-01 NOTE — Telephone Encounter (Signed)
Pt aware.

## 2019-02-18 DIAGNOSIS — N3020 Other chronic cystitis without hematuria: Secondary | ICD-10-CM | POA: Diagnosis not present

## 2019-03-04 DIAGNOSIS — M19011 Primary osteoarthritis, right shoulder: Secondary | ICD-10-CM | POA: Diagnosis not present

## 2019-03-04 DIAGNOSIS — M19012 Primary osteoarthritis, left shoulder: Secondary | ICD-10-CM | POA: Diagnosis not present

## 2019-04-25 ENCOUNTER — Other Ambulatory Visit: Payer: Self-pay | Admitting: Family Medicine

## 2019-04-25 DIAGNOSIS — E039 Hypothyroidism, unspecified: Secondary | ICD-10-CM

## 2019-04-25 DIAGNOSIS — E785 Hyperlipidemia, unspecified: Secondary | ICD-10-CM

## 2019-04-25 DIAGNOSIS — K219 Gastro-esophageal reflux disease without esophagitis: Secondary | ICD-10-CM

## 2019-04-25 DIAGNOSIS — I10 Essential (primary) hypertension: Secondary | ICD-10-CM

## 2019-04-27 ENCOUNTER — Other Ambulatory Visit: Payer: Self-pay | Admitting: *Deleted

## 2019-04-27 DIAGNOSIS — Z1231 Encounter for screening mammogram for malignant neoplasm of breast: Secondary | ICD-10-CM | POA: Diagnosis not present

## 2019-04-27 DIAGNOSIS — F419 Anxiety disorder, unspecified: Secondary | ICD-10-CM

## 2019-04-27 LAB — HM MAMMOGRAPHY

## 2019-04-27 MED ORDER — DICLOFENAC SODIUM 50 MG PO TBEC: DELAYED_RELEASE_TABLET | ORAL | 1 refills | Status: DC

## 2019-04-27 MED ORDER — RALOXIFENE HCL 60 MG PO TABS: 60.0000 mg | ORAL_TABLET | Freq: Every day | ORAL | 1 refills | Status: DC

## 2019-04-27 MED ORDER — DULOXETINE HCL 60 MG PO CPEP: 60.0000 mg | ORAL_CAPSULE | Freq: Every day | ORAL | 0 refills | Status: DC

## 2019-05-04 ENCOUNTER — Other Ambulatory Visit: Payer: Self-pay

## 2019-05-04 ENCOUNTER — Inpatient Hospital Stay (HOSPITAL_COMMUNITY)
Admission: EM | Admit: 2019-05-04 | Discharge: 2019-05-07 | DRG: 392 | Disposition: A | Discharge status: Home Health | Payer: Medicare Other | Attending: Internal Medicine | Admitting: Internal Medicine

## 2019-05-04 ENCOUNTER — Emergency Department (HOSPITAL_COMMUNITY): Payer: Medicare Other

## 2019-05-04 ENCOUNTER — Encounter (HOSPITAL_COMMUNITY): Payer: Self-pay | Admitting: Emergency Medicine

## 2019-05-04 DIAGNOSIS — R4182 Altered mental status, unspecified: Secondary | ICD-10-CM | POA: Diagnosis not present

## 2019-05-04 DIAGNOSIS — Z8249 Family history of ischemic heart disease and other diseases of the circulatory system: Secondary | ICD-10-CM

## 2019-05-04 DIAGNOSIS — Z7982 Long term (current) use of aspirin: Secondary | ICD-10-CM

## 2019-05-04 DIAGNOSIS — R197 Diarrhea, unspecified: Secondary | ICD-10-CM | POA: Diagnosis present

## 2019-05-04 DIAGNOSIS — K5792 Diverticulitis of intestine, part unspecified, without perforation or abscess without bleeding: Secondary | ICD-10-CM | POA: Diagnosis not present

## 2019-05-04 DIAGNOSIS — K219 Gastro-esophageal reflux disease without esophagitis: Secondary | ICD-10-CM | POA: Diagnosis present

## 2019-05-04 DIAGNOSIS — G319 Degenerative disease of nervous system, unspecified: Secondary | ICD-10-CM | POA: Diagnosis present

## 2019-05-04 DIAGNOSIS — I4891 Unspecified atrial fibrillation: Secondary | ICD-10-CM | POA: Diagnosis not present

## 2019-05-04 DIAGNOSIS — R0689 Other abnormalities of breathing: Secondary | ICD-10-CM | POA: Diagnosis not present

## 2019-05-04 DIAGNOSIS — Z87440 Personal history of urinary (tract) infections: Secondary | ICD-10-CM

## 2019-05-04 DIAGNOSIS — I959 Hypotension, unspecified: Secondary | ICD-10-CM | POA: Diagnosis not present

## 2019-05-04 DIAGNOSIS — I10 Essential (primary) hypertension: Secondary | ICD-10-CM | POA: Diagnosis present

## 2019-05-04 DIAGNOSIS — R112 Nausea with vomiting, unspecified: Secondary | ICD-10-CM | POA: Diagnosis not present

## 2019-05-04 DIAGNOSIS — F419 Anxiety disorder, unspecified: Secondary | ICD-10-CM | POA: Diagnosis present

## 2019-05-04 DIAGNOSIS — Z881 Allergy status to other antibiotic agents status: Secondary | ICD-10-CM

## 2019-05-04 DIAGNOSIS — Z20828 Contact with and (suspected) exposure to other viral communicable diseases: Secondary | ICD-10-CM | POA: Diagnosis not present

## 2019-05-04 DIAGNOSIS — Z66 Do not resuscitate: Secondary | ICD-10-CM | POA: Diagnosis present

## 2019-05-04 DIAGNOSIS — D7389 Other diseases of spleen: Secondary | ICD-10-CM | POA: Diagnosis present

## 2019-05-04 DIAGNOSIS — Z882 Allergy status to sulfonamides status: Secondary | ICD-10-CM

## 2019-05-04 DIAGNOSIS — Z880 Allergy status to penicillin: Secondary | ICD-10-CM

## 2019-05-04 DIAGNOSIS — E039 Hypothyroidism, unspecified: Secondary | ICD-10-CM | POA: Diagnosis not present

## 2019-05-04 DIAGNOSIS — N183 Chronic kidney disease, stage 3 unspecified: Secondary | ICD-10-CM | POA: Diagnosis present

## 2019-05-04 DIAGNOSIS — E785 Hyperlipidemia, unspecified: Secondary | ICD-10-CM | POA: Diagnosis not present

## 2019-05-04 DIAGNOSIS — K5732 Diverticulitis of large intestine without perforation or abscess without bleeding: Principal | ICD-10-CM | POA: Diagnosis present

## 2019-05-04 DIAGNOSIS — I129 Hypertensive chronic kidney disease with stage 1 through stage 4 chronic kidney disease, or unspecified chronic kidney disease: Secondary | ICD-10-CM | POA: Diagnosis present

## 2019-05-04 DIAGNOSIS — Z993 Dependence on wheelchair: Secondary | ICD-10-CM

## 2019-05-04 DIAGNOSIS — R748 Abnormal levels of other serum enzymes: Secondary | ICD-10-CM | POA: Diagnosis not present

## 2019-05-04 DIAGNOSIS — Z8673 Personal history of transient ischemic attack (TIA), and cerebral infarction without residual deficits: Secondary | ICD-10-CM

## 2019-05-04 DIAGNOSIS — Z6835 Body mass index (BMI) 35.0-35.9, adult: Secondary | ICD-10-CM

## 2019-05-04 DIAGNOSIS — Z79890 Hormone replacement therapy (postmenopausal): Secondary | ICD-10-CM

## 2019-05-04 DIAGNOSIS — N179 Acute kidney failure, unspecified: Secondary | ICD-10-CM | POA: Diagnosis present

## 2019-05-04 DIAGNOSIS — Z79899 Other long term (current) drug therapy: Secondary | ICD-10-CM

## 2019-05-04 DIAGNOSIS — Z9049 Acquired absence of other specified parts of digestive tract: Secondary | ICD-10-CM

## 2019-05-04 DIAGNOSIS — Z833 Family history of diabetes mellitus: Secondary | ICD-10-CM

## 2019-05-04 DIAGNOSIS — Z8349 Family history of other endocrine, nutritional and metabolic diseases: Secondary | ICD-10-CM

## 2019-05-04 DIAGNOSIS — E669 Obesity, unspecified: Secondary | ICD-10-CM | POA: Diagnosis present

## 2019-05-04 DIAGNOSIS — K449 Diaphragmatic hernia without obstruction or gangrene: Secondary | ICD-10-CM | POA: Diagnosis not present

## 2019-05-04 DIAGNOSIS — Z888 Allergy status to other drugs, medicaments and biological substances status: Secondary | ICD-10-CM

## 2019-05-04 DIAGNOSIS — Z8261 Family history of arthritis: Secondary | ICD-10-CM

## 2019-05-04 DIAGNOSIS — R110 Nausea: Secondary | ICD-10-CM | POA: Diagnosis not present

## 2019-05-04 DIAGNOSIS — R0989 Other specified symptoms and signs involving the circulatory and respiratory systems: Secondary | ICD-10-CM | POA: Diagnosis not present

## 2019-05-04 LAB — CBC WITH DIFFERENTIAL/PLATELET
Abs Immature Granulocytes: 0.03 10*3/uL (ref 0.00–0.07)
Basophils Absolute: 0.1 10*3/uL (ref 0.0–0.1)
Basophils Relative: 1 %
Eosinophils Absolute: 0.4 10*3/uL (ref 0.0–0.5)
Eosinophils Relative: 4 %
HCT: 36.9 % (ref 36.0–46.0)
Hemoglobin: 11.1 g/dL — ABNORMAL LOW (ref 12.0–15.0)
Immature Granulocytes: 0 %
Lymphocytes Relative: 10 %
Lymphs Abs: 1 10*3/uL (ref 0.7–4.0)
MCH: 30.4 pg (ref 26.0–34.0)
MCHC: 30.1 g/dL (ref 30.0–36.0)
MCV: 101.1 fL — ABNORMAL HIGH (ref 80.0–100.0)
Monocytes Absolute: 0.4 10*3/uL (ref 0.1–1.0)
Monocytes Relative: 4 %
Neutro Abs: 7.9 10*3/uL — ABNORMAL HIGH (ref 1.7–7.7)
Neutrophils Relative %: 81 %
Platelets: 209 10*3/uL (ref 150–400)
RBC: 3.65 MIL/uL — ABNORMAL LOW (ref 3.87–5.11)
RDW: 14.1 % (ref 11.5–15.5)
WBC: 9.8 10*3/uL (ref 4.0–10.5)
nRBC: 0 % (ref 0.0–0.2)

## 2019-05-04 LAB — URINALYSIS, ROUTINE W REFLEX MICROSCOPIC
Bilirubin Urine: NEGATIVE
Glucose, UA: NEGATIVE mg/dL
Hgb urine dipstick: NEGATIVE
Ketones, ur: 5 mg/dL — AB
Leukocytes,Ua: NEGATIVE
Nitrite: NEGATIVE
Protein, ur: 100 mg/dL — AB
Specific Gravity, Urine: 1.017 (ref 1.005–1.030)
pH: 5 (ref 5.0–8.0)

## 2019-05-04 LAB — COMPREHENSIVE METABOLIC PANEL
ALT: 22 U/L (ref 0–44)
AST: 22 U/L (ref 15–41)
Albumin: 3.5 g/dL (ref 3.5–5.0)
Alkaline Phosphatase: 75 U/L (ref 38–126)
Anion gap: 8 (ref 5–15)
BUN: 31 mg/dL — ABNORMAL HIGH (ref 8–23)
CO2: 21 mmol/L — ABNORMAL LOW (ref 22–32)
Calcium: 8.9 mg/dL (ref 8.9–10.3)
Chloride: 112 mmol/L — ABNORMAL HIGH (ref 98–111)
Creatinine, Ser: 1.37 mg/dL — ABNORMAL HIGH (ref 0.44–1.00)
GFR calc Af Amer: 42 mL/min — ABNORMAL LOW (ref >60)
GFR calc non Af Amer: 37 mL/min — ABNORMAL LOW (ref >60)
Glucose, Bld: 140 mg/dL — ABNORMAL HIGH (ref 70–99)
Potassium: 4.1 mmol/L (ref 3.5–5.1)
Sodium: 141 mmol/L (ref 135–145)
Total Bilirubin: 0.4 mg/dL (ref 0.3–1.2)
Total Protein: 6.2 g/dL — ABNORMAL LOW (ref 6.5–8.1)

## 2019-05-04 LAB — TROPONIN I (HIGH SENSITIVITY)
Troponin I (High Sensitivity): 4 ng/L (ref <18)
Troponin I (High Sensitivity): 5 ng/L (ref <18)

## 2019-05-04 LAB — C DIFFICILE QUICK SCREEN W PCR REFLEX
C Diff antigen: NEGATIVE
C Diff interpretation: NOT DETECTED
C Diff toxin: NEGATIVE

## 2019-05-04 LAB — LIPASE, BLOOD: Lipase: 33 U/L (ref 11–51)

## 2019-05-04 LAB — POC OCCULT BLOOD, ED: Fecal Occult Bld: NEGATIVE

## 2019-05-04 LAB — LACTIC ACID, PLASMA
Lactic Acid, Venous: 1.7 mmol/L (ref 0.5–1.9)
Lactic Acid, Venous: 1.8 mmol/L (ref 0.5–1.9)

## 2019-05-04 LAB — SARS CORONAVIRUS 2 BY RT PCR (HOSPITAL ORDER, PERFORMED IN ~~LOC~~ HOSPITAL LAB): SARS Coronavirus 2: NEGATIVE

## 2019-05-04 MED ORDER — ONDANSETRON HCL 4 MG/2ML IJ SOLN
4.0000 mg | INTRAMUSCULAR | Status: AC | PRN
  Administered 2019-05-04 – 2019-05-06 (×2): 4 mg via INTRAVENOUS
  Filled 2019-05-04 (×2): qty 2

## 2019-05-04 MED ORDER — ENOXAPARIN SODIUM 40 MG/0.4ML ~~LOC~~ SOLN
40.0000 mg | SUBCUTANEOUS | Status: DC
  Administered 2019-05-05 – 2019-05-07 (×3): 40 mg via SUBCUTANEOUS
  Filled 2019-05-04 (×3): qty 0.4

## 2019-05-04 MED ORDER — SODIUM CHLORIDE 0.9 % IV SOLN
2.0000 g | Freq: Once | INTRAVENOUS | Status: AC
  Administered 2019-05-04: 2 g via INTRAVENOUS
  Filled 2019-05-04: qty 20

## 2019-05-04 MED ORDER — SODIUM CHLORIDE 0.9 % IV BOLUS
500.0000 mL | Freq: Once | INTRAVENOUS | Status: AC
  Administered 2019-05-04: 21:00:00 500 mL via INTRAVENOUS

## 2019-05-04 MED ORDER — SODIUM CHLORIDE 0.9 % IV SOLN
INTRAVENOUS | Status: DC
  Administered 2019-05-04 – 2019-05-05 (×2): via INTRAVENOUS

## 2019-05-04 MED ORDER — ACETAMINOPHEN 325 MG PO TABS: 650.0000 mg | ORAL_TABLET | Freq: Four times a day (QID) | ORAL | Status: DC | PRN

## 2019-05-04 MED ORDER — SODIUM CHLORIDE 0.9 % IV SOLN: INTRAVENOUS | Status: AC

## 2019-05-04 MED ORDER — SODIUM CHLORIDE 0.9 % IV BOLUS
500.0000 mL | Freq: Once | INTRAVENOUS | Status: AC
  Administered 2019-05-04: 18:00:00 500 mL via INTRAVENOUS

## 2019-05-04 MED ORDER — METRONIDAZOLE IN NACL 5-0.79 MG/ML-% IV SOLN
500.0000 mg | Freq: Once | INTRAVENOUS | Status: AC
  Administered 2019-05-04: 22:00:00 500 mg via INTRAVENOUS
  Filled 2019-05-04: qty 100

## 2019-05-04 MED ORDER — ACETAMINOPHEN 650 MG RE SUPP: 650.0000 mg | Freq: Four times a day (QID) | RECTAL | Status: DC | PRN

## 2019-05-04 NOTE — H&P (Signed)
TRH H&P    Patient Demographics:    Tamara Stein, is a 78 y.o. female  MRN: [id number]  DOB - [birth year]  Admit Date - 05/04/2019  Referring MD/NP/PA: Nunzio Cory mcmannus  Outpatient Primary MD for the patient is Janora Norlander, DO  Patient coming from:  home  Chief complaint-  Abdominal pain, diarrhea   HPI:    Tamara Stein  is a 78 y.o. female,  w anxiety, hypothyroidism,  cerebellar degeneration, hypertension, hyperlipidemia, ckd stage3, gerd, hx of prior colonoscopy  10/18/2012 -> ?,  w hx of intermittent diarrhea, apparently presents with c/o lower abdominal discomfort as well as diarrhea for the past 1-2 days.  Pt notes slight n/v ,  But denies fever, chills, cough, cp, palp, sob, brbpr dysuria, hematuria.   In ED,  T 97.7 P 89 R 20 Bp 134/57  Pox 99%  Wt 87.1 kg  CT abd/ pelvis IMPRESSION: 1. Trace free fluid in the pelvis and concern for mild inflammatory changes around the sigmoid colon. There are colonic diverticula in this area. Unfortunately, there is significant motion artifact in this area that limits evaluation of the pelvis. Cannot exclude diverticulitis involving the sigmoid colon. No evidence for an abscess collection. 2. Low-density splenic lesion is nonspecific but could be cystic. Lesion may have slightly enlarged since April 2020 but clearly enlarged since 2017. Based on the evidence for enlargement since 2017, consider further characterization of the splenic lesion with MRI or follow-up CT in 3-6 months to ensure stability. 3. Minimal change in the low-density left adnexal lesion. This probably represents a cystic structure and favor a benign etiology based on the minimal change since 2017. 4. Large hiatal hernia. 5. Probable bilateral renal cysts.   Na 141, K 4.1, Bun 31, creatinine 1.37 Glucose 140 Ast 22, Alt 22 Lipase 33 Trop 4  Wbc 9.8, Hgb 11.1, Plt  209  covid -19 negative  Urinalysis prot 100  Pt will be admitted for diverticulitis     Review of systems:    In addition to the HPI above,  No Fever-chills, No Headache, No changes with Vision or hearing, No problems swallowing food or Liquids, No Chest pain, Cough or Shortness of Breath,  No Blood in stool or Urine, No dysuria, No new skin rashes or bruises, No new joints pains-aches,  No new weakness, tingling, numbness in any extremity, No recent weight gain or loss, No polyuria, polydypsia or polyphagia, No significant Mental Stressors.  All other systems reviewed and are negative.    Past History of the following :    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[place])    Chronic kidney disease    Chronic knee pain    Depression    GERD (gastroesophageal reflux disease)    Hyperlipidemia    Hypertension    Thyroid disease       Past Surgical History:  Procedure Laterality Date   ABDOMINAL HYSTERECTOMY     CHOLECYSTECTOMY  MOUTH SURGERY     RIGHT ELBOW        Social History:      Social History   Tobacco Use   Smoking status: Never Smoker   Smokeless tobacco: Never Used  Substance Use Topics   Alcohol use: No       Family History :     Family History  Problem Relation Age of Onset   Arthritis Sister    Hyperlipidemia Sister    Hypertension Sister    Cancer Brother    Diabetes Brother    Heart disease Brother        Home Medications:   Prior to Admission medications   Medication Sig Start Date End Date Taking? Authorizing Provider  amLODipine (NORVASC) 10 MG tablet TAKE 1 TABLET DAILY FOR    BLOOD PRESSURE Patient taking differently: Take 10 mg by mouth every morning.  04/26/19  Yes Gottschalk, Ashly M, DO  atorvastatin (LIPITOR) 40 MG tablet TAKE 1 TABLET AT BEDTIME Patient taking differently: Take 40 mg by mouth at bedtime.  04/26/19  Yes  Ronnie Doss M, DO  ciprofloxacin (CIPRO) 250 MG tablet Take 125 mg by mouth at bedtime.   Yes [provider]  diclofenac (VOLTAREN) 50 MG EC tablet TAKE 1 TABLET BY MOUTH (50MG TOTAL) THREE TIMES DAILY Patient taking differently: Take 50-100 mg by mouth See admin instructions. 100MG IN THE MORNING AND 50MG AT BEDTIME 04/27/19  Yes Gottschalk, Ashly M, DO  DULoxetine (CYMBALTA) 60 MG capsule Take 1 capsule (60 mg total) by mouth daily. Patient taking differently: Take 60 mg by mouth every morning.  04/27/19  Yes Gottschalk, Leatrice Jewels M, DO  furosemide (LASIX) 20 MG tablet Take 1 tablet (20 mg total) by mouth daily as needed. As needed for swelling 08/25/17  Yes Eustaquio Maize, MD  levothyroxine (SYNTHROID) 50 MCG tablet TAKE 1 TABLET ONCE DAILY Patient taking differently: Take 50 mcg by mouth daily before breakfast.  04/26/19  Yes Gottschalk, Ashly M, DO  lisinopril (ZESTRIL) 30 MG tablet TAKE 1 TABLET DAILY Patient taking differently: Take 30 mg by mouth daily before breakfast.  04/26/19  Yes Gottschalk, Ashly M, DO  meclizine (ANTIVERT) 25 MG tablet Take 25 mg by mouth 3 (three) times daily as needed for dizziness.   Yes [provider]  omeprazole (PRILOSEC) 40 MG capsule TAKE 1 CAPSULE DAILY Patient taking differently: Take 40 mg by mouth daily before breakfast.  04/26/19  Yes Gottschalk, Ashly M, DO  ondansetron (ZOFRAN ODT) 4 MG disintegrating tablet Take 1 tablet (4 mg total) by mouth every 8 (eight) hours as needed. 11/14/18  Yes Isla Pence, MD  raloxifene (EVISTA) 60 MG tablet Take 1 tablet (60 mg total) by mouth daily. Patient taking differently: Take 60 mg by mouth every morning.  04/27/19  Yes Ronnie Doss M, DO  aspirin EC 81 MG tablet Take 81 mg by mouth every morning.     [provider]  Bacillus Coagulans-Inulin (ALIGN PREBIOTIC-PROBIOTIC) 5-1.25 MG-GM CHEW Chew 1 tablet by mouth daily. 11/14/18   Isla Pence, MD  Calcium Carbonate-Vitamin D  (CALTRATE 600+D PO) Take 1 tablet by mouth daily.    [provider]  cetirizine (ZYRTEC) 10 MG chewable tablet Chew 10 mg by mouth daily.    [provider]  Multiple Vitamin (MULTIVITAMIN WITH MINERALS) TABS tablet Take 1 tablet by mouth daily.    [provider]  atorvastatin (LIPITOR) 40 MG tablet Take 1 tablet (40 mg total) by mouth  at bedtime. 11/05/18   Janora Norlander, DO  DULoxetine (CYMBALTA) 60 MG capsule Take 1 capsule (60 mg total) by mouth daily. 11/05/18   Janora Norlander, DO     Allergies:     Allergies  Allergen Reactions   Sulfa Antibiotics Anaphylaxis    Facial swelling    Codeine Other (See Comments)    BLACK OUTS   Doxycycline Nausea Only   Erythromycin Swelling   Ibuprofen Other (See Comments)    Stomach pain, muscle spasm, GI bleeding   Levaquin [Levofloxacin] Other (See Comments)    Insomnia/trouble breathing   Penicillins Diarrhea    Has patient had a PCN reaction causing immediate rash, facial/tongue/throat swelling, SOB or lightheadedness with hypotension: No Has patient had a PCN reaction causing severe rash involving mucus membranes or skin necrosis: No Has patient had a PCN reaction that required hospitalization Unknown Has patient had a PCN reaction occurring within the last 10 years: No If all of the above answers are "NO", then may proceed with Cephalosporin use.      Physical Exam:   Vitals  Blood pressure 127/64, pulse (!) 120, temperature 97.7 F (36.5 C), temperature source Oral, resp. rate 18, height 5' 2"  (1.575 m), weight 87.1 kg, SpO2 94 %.  1.  General: axoxo3  2. Psychiatric: euthymic  3. Neurologic: cn2-12 intact, reflexes 2+ symmetric, diffuse with no clonus, motor 5/5 in all 4 ext  4. HEENMT:  Anicteric, pupils 1.44m symmetric, direct, consensual, near intact Neck: no jvd  5. Respiratory : CTAB  6. Cardiovascular : rrr s1, s2,   7. Gastrointestinal:  Abd: soft, obese, nt,  nd, +bs  8. Skin:  Ext: no c/c/e,  No rash  9.Musculoskeletal:  Good ROM     Data Review:    CBC Recent Labs  Lab 05/04/19 1653  WBC 9.8  HGB 11.1*  HCT 36.9  PLT 209  MCV 101.1*  MCH 30.4  MCHC 30.1  RDW 14.1  LYMPHSABS 1.0  MONOABS 0.4  EOSABS 0.4  BASOSABS 0.1   ------------------------------------------------------------------------------------------------------------------  Results for orders placed or performed during the hospital encounter of 05/04/19 (from the past 48 hour(s))  Urinalysis, Routine w reflex microscopic     Status: Abnormal   Collection Time: 05/04/19  4:43 PM  Result Value Ref Range   Color, Urine AMBER (A) YELLOW    Comment: BIOCHEMICALS MAY BE AFFECTED BY COLOR   APPearance HAZY (A) CLEAR   Specific Gravity, Urine 1.017 1.005 - 1.030   pH 5.0 5.0 - 8.0   Glucose, UA NEGATIVE NEGATIVE mg/dL   Hgb urine dipstick NEGATIVE NEGATIVE   Bilirubin Urine NEGATIVE NEGATIVE   Ketones, ur 5 (A) NEGATIVE mg/dL   Protein, ur 100 (A) NEGATIVE mg/dL   Nitrite NEGATIVE NEGATIVE   Leukocytes,Ua NEGATIVE NEGATIVE   RBC / HPF 0-5 0 - 5 RBC/hpf   WBC, UA 0-5 0 - 5 WBC/hpf   Bacteria, UA RARE (A) NONE SEEN   Squamous Epithelial / LPF 0-5 0 - 5   Hyaline Casts, UA PRESENT    Amorphous Crystal PRESENT     Comment: Performed at [facility] [address] [place] [address] C difficile quick scan w PCR reflex     Status: None   Collection Time: 05/04/19  4:43 PM   Specimen: STOOL  Result Value Ref Range   C Diff antigen NEGATIVE NEGATIVE   C Diff toxin NEGATIVE NEGATIVE   C Diff interpretation No C. difficile  detected.     Comment: Performed at [facility], [address] [place], [place] [address]  Comprehensive metabolic panel     Status: Abnormal   Collection Time: 05/04/19  4:53 PM  Result Value Ref Range   Sodium 141 135 - 145 mmol/L   Potassium 4.1 3.5 - 5.1 mmol/L   Chloride 112 (H) 98 - 111 mmol/L   CO2 21 (L) 22 - 32 mmol/L    Glucose, Bld 140 (H) 70 - 99 mg/dL   BUN 31 (H) 8 - 23 mg/dL   Creatinine, Ser 1.37 (H) 0.44 - 1.00 mg/dL   Calcium 8.9 8.9 - 10.3 mg/dL   Total Protein 6.2 (L) 6.5 - 8.1 g/dL   Albumin 3.5 3.5 - 5.0 g/dL   AST 22 15 - 41 U/L   ALT 22 0 - 44 U/L   Alkaline Phosphatase 75 38 - 126 U/L   Total Bilirubin 0.4 0.3 - 1.2 mg/dL   GFR calc non Af Amer 37 (L) >60 mL/min   GFR calc Af Amer 42 (L) >60 mL/min   Anion gap 8 5 - 15    Comment: Performed at [facility], [address], [place], [place] [address]  Lipase, blood     Status: None   Collection Time: 05/04/19  4:53 PM  Result Value Ref Range   Lipase 33 11 - 51 U/L    Comment: Performed at [facility], [address] [place], [place] [address]  Troponin I (High Sensitivity)     Status: None   Collection Time: 05/04/19  4:53 PM  Result Value Ref Range   Troponin I (High Sensitivity) 4 <18 ng/L    Comment: (NOTE) Elevated high sensitivity troponin I (hsTnI) values and significant  changes across serial measurements may suggest ACS but many other  chronic and acute conditions are known to elevate hsTnI results.  Refer to the "Links" section for chest pain algorithms and additional  guidance. Performed at [facility], [address], [place], [place] [address]   Lactic acid, plasma     Status: None   Collection Time: 05/04/19  4:53 PM  Result Value Ref Range   Lactic Acid, Venous 1.7 0.5 - 1.9 mmol/L    Comment: Performed at [facility], [address] [place], [place] [address]  CBC with Differential     Status: Abnormal   Collection Time: 05/04/19  4:53 PM  Result Value Ref Range   WBC 9.8 4.0 - 10.5 K/uL   RBC 3.65 (L) 3.87 - 5.11 MIL/uL   Hemoglobin 11.1 (L) 12.0 - 15.0 g/dL   HCT 36.9 36.0 - 46.0 %   MCV 101.1 (H) 80.0 - 100.0 fL   MCH 30.4 26.0 - 34.0 pg   MCHC 30.1 30.0 - 36.0 g/dL   RDW 14.1 11.5 - 15.5 %   Platelets 209 150 - 400 K/uL   nRBC 0.0 0.0 - 0.2 %   Neutrophils Relative % 81 %   Neutro Abs 7.9 (H) 1.7 -  7.7 K/uL   Lymphocytes Relative 10 %   Lymphs Abs 1.0 0.7 - 4.0 K/uL   Monocytes Relative 4 %   Monocytes Absolute 0.4 0.1 - 1.0 K/uL   Eosinophils Relative 4 %   Eosinophils Absolute 0.4 0.0 - 0.5 K/uL   Basophils Relative 1 %   Basophils Absolute 0.1 0.0 - 0.1 K/uL   Immature Granulocytes 0 %   Abs Immature Granulocytes 0.03 0.00 - 0.07 K/uL    Comment:  Performed at [facility], [address], [place], [place] [address]  SARS Coronavirus 2 [facility] order, Performed in [facility] hospital lab) Nasopharyngeal Nasopharyngeal Swab     Status: None   Collection Time: 05/04/19  6:11 PM   Specimen: Nasopharyngeal Swab  Result Value Ref Range   SARS Coronavirus 2 NEGATIVE NEGATIVE    Comment: (NOTE) If result is NEGATIVE SARS-CoV-2 target nucleic acids are NOT DETECTED. The SARS-CoV-2 RNA is generally detectable in upper and lower  respiratory specimens during the acute phase of infection. The lowest  concentration of SARS-CoV-2 viral copies this assay can detect is 250  copies / mL. A negative result does not preclude SARS-CoV-2 infection  and should not be used as the sole basis for treatment or other  patient management decisions.  A negative result may occur with  improper specimen collection / handling, submission of specimen other  than nasopharyngeal swab, presence of viral mutation(s) within the  areas targeted by this assay, and inadequate number of viral copies  (<250 copies / mL). A negative result must be combined with clinical  observations, patient history, and epidemiological information. If result is POSITIVE SARS-CoV-2 target nucleic acids are DETECTED. The SARS-CoV-2 RNA is generally detectable in upper and lower  respiratory specimens dur ing the acute phase of infection.  Positive  results are indicative of active infection with SARS-CoV-2.  Clinical  correlation with patient history and other diagnostic information is  necessary to determine patient infection  status.  Positive results do  not rule out bacterial infection or co-infection with other viruses. If result is PRESUMPTIVE POSTIVE SARS-CoV-2 nucleic acids MAY BE PRESENT.   A presumptive positive result was obtained on the submitted specimen  and confirmed on repeat testing.  While 2019 novel coronavirus  (SARS-CoV-2) nucleic acids may be present in the submitted sample  additional confirmatory testing may be necessary for epidemiological  and / or clinical management purposes  to differentiate between  SARS-CoV-2 and other Sarbecovirus currently known to infect humans.  If clinically indicated additional testing with an alternate test  methodology [phone]) is advised. The SARS-CoV-2 RNA is generally  detectable in upper and lower respiratory sp ecimens during the acute  phase of infection. The expected result is Negative. Fact Sheet for Patients:  StrictlyIdeas.no Fact Sheet for Healthcare Providers: BankingDealers.co.za This test is not yet approved or cleared by the [place] FDA and has been authorized for detection and/or diagnosis of SARS-CoV-2 by FDA under an Emergency Use Authorization (EUA).  This EUA will remain in effect (meaning this test can be used) for the duration of the COVID-19 declaration under Section 564(b)(1) of the Act, 21 U.S.C. section 360bbb-3(b)(1), unless the authorization is terminated or revoked sooner. Performed at [facility], [address], [place], [place] [address]   POC occult blood, ED     Status: None   Collection Time: 05/04/19  6:22 PM  Result Value Ref Range   Fecal Occult Bld NEGATIVE NEGATIVE  Lactic acid, plasma     Status: None   Collection Time: 05/04/19  7:31 PM  Result Value Ref Range   Lactic Acid, Venous 1.8 0.5 - 1.9 mmol/L    Comment: Performed at [facility], [address] [place], [place] [address]  Troponin I (High Sensitivity)     Status: None   Collection Time:  05/04/19  7:31 PM  Result Value Ref Range   Troponin I (High Sensitivity) 5 <18 ng/L    Comment: (NOTE) Elevated high  sensitivity troponin I (hsTnI) values and significant  changes across serial measurements may suggest ACS but many other  chronic and acute conditions are known to elevate hsTnI results.  Refer to the "Links" section for chest pain algorithms and additional  guidance. Performed at [facility], [address], [place], [place] [address]     Chemistries  Recent Labs  Lab 05/04/19 1653  NA 141  K 4.1  CL 112*  CO2 21*  GLUCOSE 140*  BUN 31*  CREATININE 1.37*  CALCIUM 8.9  AST 22  ALT 22  ALKPHOS 75  BILITOT 0.4   ------------------------------------------------------------------------------------------------------------------  ------------------------------------------------------------------------------------------------------------------ GFR: Estimated Creatinine Clearance: 34.1 mL/min (A) (by C-G formula based on SCr of 1.37 mg/dL (H)). Liver Function Tests: Recent Labs  Lab 05/04/19 1653  AST 22  ALT 22  ALKPHOS 75  BILITOT 0.4  PROT 6.2*  ALBUMIN 3.5   Recent Labs  Lab 05/04/19 1653  LIPASE 33   No results for input(s): AMMONIA in the last 168 hours. Coagulation Profile: No results for input(s): INR, PROTIME in the last 168 hours. Cardiac Enzymes: No results for input(s): CKTOTAL, CKMB, CKMBINDEX, TROPONINI in the last 168 hours. BNP (last 3 results) No results for input(s): PROBNP in the last 8760 hours. HbA1C: No results for input(s): HGBA1C in the last 72 hours. CBG: No results for input(s): GLUCAP in the last 168 hours. Lipid Profile: No results for input(s): CHOL, HDL, LDLCALC, TRIG, CHOLHDL, LDLDIRECT in the last 72 hours. Thyroid Function Tests: No results for input(s): TSH, T4TOTAL, FREET4, T3FREE, THYROIDAB in the last 72 hours. Anemia Panel: No results for input(s): VITAMINB12, FOLATE, FERRITIN, TIBC, IRON, RETICCTPCT in  the last 72 hours.  --------------------------------------------------------------------------------------------------------------- Urine analysis:    Component Value Date/Time   COLORURINE AMBER (A) 05/04/2019 1643   APPEARANCEUR HAZY (A) 05/04/2019 1643   APPEARANCEUR Cloudy (A) 01/25/2019 1425   LABSPEC 1.017 05/04/2019 1643   PHURINE 5.0 05/04/2019 1643   GLUCOSEU NEGATIVE 05/04/2019 1643   HGBUR NEGATIVE 05/04/2019 1643   BILIRUBINUR NEGATIVE 05/04/2019 1643   BILIRUBINUR Negative 01/25/2019 1425   KETONESUR 5 (A) 05/04/2019 1643   PROTEINUR 100 (A) 05/04/2019 1643   UROBILINOGEN negative 09/06/2015 1155   UROBILINOGEN 0.2 03/25/2014 1359   NITRITE NEGATIVE 05/04/2019 1643   LEUKOCYTESUR NEGATIVE 05/04/2019 1643      Imaging Results:    Ct Abdomen Pelvis Wo Contrast  Result Date: 05/04/2019 CLINICAL DATA:  Abdominal pain.  Diarrhea. EXAM: CT ABDOMEN AND PELVIS WITHOUT CONTRAST TECHNIQUE: Multidetector CT imaging of the abdomen and pelvis was performed following the standard protocol without IV contrast. COMPARISON:  CT 11/14/2018 FINDINGS: Lower chest: Lung bases are clear.  Large hiatal hernia. Hepatobiliary: Gallbladder is absent. Normal appearance of the liver. Extrahepatic bile duct appears to be prominent and similar to the previous examination. Pancreas: Unremarkable. No pancreatic ductal dilatation or surrounding inflammatory changes. Spleen: Again noted is a low-density lesion involving the superior aspect of the spleen. This lesion roughly measures 3.0 x 2.6 x 2.3 cm. Lesion measured 3.0 x 2.5 x 1.4 cm in April 2020. Lesion was probably present in 2017 but clearly much smaller. Adrenals/Urinary Tract: Normal adrenal glands. Low-density renal structures probably represent small cysts. No hydronephrosis. Urinary bladder is unremarkable. Negative for kidney stones. Stomach/Bowel: Large hiatal hernia. Normal appearance of the duodenum. Mild inflammatory changes around the  sigmoid colon and there are few colonic diverticula involving the sigmoid colon. Mild pericolonic edema in left lower quadrant involving the proximal sigmoid colon and  distal descending colon. Mild distention of the cecum is similar to the previous examination. No small bowel dilatation. Vascular/Lymphatic: Atherosclerotic calcifications in the aorta and iliac arteries without aneurysm. No significant lymph node enlargement in the abdomen or pelvis. Reproductive: Again noted is a low-density structure involving the left adnexa measuring roughly 4.2 cm and stable from the exam in April 2020 and minimally enlarged since 2017. Uterus has been removed. Other: Small amount of fluid in the pelvis.  Negative for free air. Musculoskeletal: Chronic compression deformity involving superior endplate of L4. Facet arthropathy in the lumbar spine. IMPRESSION: 1. Trace free fluid in the pelvis and concern for mild inflammatory changes around the sigmoid colon. There are colonic diverticula in this area. Unfortunately, there is significant motion artifact in this area that limits evaluation of the pelvis. Cannot exclude diverticulitis involving the sigmoid colon. No evidence for an abscess collection. 2. Low-density splenic lesion is nonspecific but could be cystic. Lesion may have slightly enlarged since April 2020 but clearly enlarged since 2017. Based on the evidence for enlargement since 2017, consider further characterization of the splenic lesion with MRI or follow-up CT in 3-6 months to ensure stability. 3. Minimal change in the low-density left adnexal lesion. This probably represents a cystic structure and favor a benign etiology based on the minimal change since 2017. 4. Large hiatal hernia. 5. Probable bilateral renal cysts. Electronically Signed   By: Markus Daft M.D.   On: 05/04/2019 19:19   Dg Chest Port 1 View  Result Date: 05/04/2019 CLINICAL DATA:  Nausea vomiting abdominal pain EXAM: PORTABLE CHEST 1 VIEW  COMPARISON:  April 17, 2016 FINDINGS: There is mild cardiomegaly. Aortic knob calcifications. There is prominence to the pulmonary vasculature. Air fluid-filled hiatal hernia is present. Degenerative changes in bilateral shoulders. IMPRESSION: Mild cardiomegaly.  Pulmonary vascular congestion. Large hiatal hernia. Electronically Signed   By: Prudencio Pair M.D.   On: 05/04/2019 17:29   nsr at 90, nl axis, no st-t changes c/w ischemia    Assessment & Plan:    Principal Problem:   Diverticulitis Active Problems:   GERD (gastroesophageal reflux disease)   Essential hypertension with goal blood pressure less than 140/90   Hyperlipidemia   Hypothyroid   CKD (chronic kidney disease) stage 3, GFR 30-59 ml/min (HCC)  Abdominal pain secondary to diverticulitis Rocephin 2gm iv qday Flagyl 553m iv tid  Diarrhea Gi pathogen panel C. Diff Abx as above  Hypertension Cont Norvasc 133mpo qday Cont Lisinopril 3015mo qday Cont Lasix 61m31m qday  Hyperlipidemia Cont Lipitor 40mg46mqhs  H/o stroke Cont aspirin Cont bp medication as above Cont Lipitor as above  Hypothyroidism Cont Levothyroxine 50 micrograms po qday   Gerd Cont PPI  Anxiety Cont Cymbalta 60mg 48mid   DVT Prophylaxis-   Lovenox - SCDs   AM Labs Ordered, also please review Full Orders  Family Communication: Admission, patients condition and plan of care including tests being ordered have been discussed with the patient  who indicate understanding and agree with the plan and Code Status.  Code Status:  DNR per patient, notified sister regarding admission to [facility]  AHainesssion status: Observation: Based on patients clinical presentation and evaluation of above clinical data, I have made determination that patient meets observation criteria at this time.   Time spent in minutes : 70 minutes   [place] Jani Graveln 05/04/2019 at 10:57 PM

## 2019-05-04 NOTE — ED Provider Notes (Signed)
[facility] EMERGENCY DEPARTMENT Provider Note   CSN: [id number] Arrival date & time: 05/04/19  1602     History   Chief Complaint Chief Complaint  Patient presents with   Abdominal Pain    HPI Tamara Stein is a 78 y.o. female.     HPI Pt was seen at 1635. Per EMS and pt report: c/o gradual onset and persistence of constant lower abd "pain" that began this afternoon after eating lunch. Has been associated with several episodes of N/V/D. EMS states pt's BP was "80/60" on scene, and she was given IV NS 144m en route. Denies CP/palpitations, no SOB/cough, no back pain, no black or blood in stools or emesis, no fevers, no rash.    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[facility]    Chronic kidney disease    Chronic knee pain    Depression    GERD (gastroesophageal reflux disease)    Hyperlipidemia    Hypertension    Thyroid disease     Patient Active Problem List   Diagnosis Date Noted   Internal impingement of left shoulder 12/24/2017   Anxiety 04/20/2017   Cerebellar dysfunction 04/20/2017   Incontinence of urine 04/20/2017   UTI (urinary tract infection) 07/15/2016   Abdominal pain 07/15/2016   Colitis 07/15/2016   Syncope and collapse 04/26/2016   Lower urinary tract infectious disease 04/26/2016   Fall    Daytime somnolence 03/10/2016   Diplopia 03/10/2016   Insomnia 03/10/2016   Sleep apnea in adult 03/10/2016   Imbalance 03/07/2016   Migraine 03/07/2016   Tremor 03/07/2016   Pain of both shoulder joints 02/29/2016   Arthritis 09/24/2015   Constipation 06/05/2015   Depression 06/05/2015   Hypothyroid 06/05/2015   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      Home Medications    Prior to Admission medications   Medication Sig Start Date End Date Taking? Authorizing Provider  amLODipine (NORVASC) 10 MG tablet TAKE 1 TABLET DAILY FOR    BLOOD PRESSURE 04/26/19   GRonnie DossM, DO  aspirin EC 81 MG tablet Take 81 mg by mouth every morning.     [provider]  atorvastatin (LIPITOR) 40 MG tablet TAKE 1 TABLET AT BEDTIME 04/26/19   Gottschalk, Ashly M, DO  Bacillus Coagulans-Inulin (ALIGN PREBIOTIC-PROBIOTIC) 5-1.25 MG-GM CHEW Chew 1 tablet by mouth daily. 11/14/18   HIsla Pence MD  Calcium Carbonate-Vitamin D (CALTRATE 600+D PO) Take 1 tablet by mouth daily.    [provider]  cephALEXin (KEFLEX) 500 MG capsule Take 1 capsule (500 mg total) by mouth 2 (two) times daily. 01/31/19   HSharion Balloon FNP  cetirizine (ZYRTEC) 10 MG chewable tablet Chew 10 mg by mouth daily.    [provider]  diclofenac (VOLTAREN) 50 MG EC tablet TAKE 1 TABLET BY MOUTH (50MG TOTAL) THREE TIMES DAILY 04/27/19   GJanora Norlander DO  dicyclomine (BENTYL) 10 MG capsule Take 1 capsule (10 mg total) by mouth 3 (three) times daily as needed for spasms. 10/14/17   Eustaquio Maize, MD  docusate sodium (COLACE) 100 MG capsule Take 1 capsule (100 mg total) by mouth 2 (two) times daily. 04/20/17   Eustaquio Maize, MD  DULoxetine (CYMBALTA) 60 MG capsule Take 1 capsule (60 mg total) by mouth daily. 04/27/19   Janora Norlander, DO  ferrous sulfate 325 (65 FE) MG tablet Take 325 mg by mouth daily with breakfast. Reported on 02/29/2016    [provider]  fluticasone (FLONASE) 50 MCG/ACT nasal spray Place 2 sprays into both nostrils daily as needed for allergies. 10/20/17   Eustaquio Maize, MD  furosemide  (LASIX) 20 MG tablet Take 1 tablet (20 mg total) by mouth daily as needed. As needed for swelling 08/25/17   Eustaquio Maize, MD  HYDROcodone-acetaminophen (NORCO/VICODIN) 5-325 MG tablet Take 1 tablet by mouth every 4 (four) hours as needed. 11/14/18   Isla Pence, MD  levothyroxine (SYNTHROID) 50 MCG tablet TAKE 1 TABLET ONCE DAILY 04/26/19   Ronnie Doss M, DO  lisinopril (ZESTRIL) 30 MG tablet TAKE 1 TABLET DAILY 04/26/19   Ronnie Doss M, DO  meclizine (ANTIVERT) 25 MG tablet Take 25 mg by mouth 3 (three) times daily as needed for dizziness.    [provider]  metroNIDAZOLE (FLAGYL) 500 MG tablet Take 1 tablet (500 mg total) by mouth 2 (two) times daily. 12/27/18   Dettinger, Fransisca Kaufmann, MD  Multiple Vitamin (MULTIVITAMIN WITH MINERALS) TABS tablet Take 1 tablet by mouth daily.    [provider]  nitrofurantoin, macrocrystal-monohydrate, (MACROBID) 100 MG capsule Take 1 capsule (100 mg total) by mouth 2 (two) times daily. 1 po BId 01/25/19   Evelina Dun A, FNP  omeprazole (PRILOSEC) 40 MG capsule TAKE 1 CAPSULE DAILY 04/26/19   Ronnie Doss M, DO  ondansetron (ZOFRAN ODT) 4 MG disintegrating tablet Take 1 tablet (4 mg total) by mouth every 8 (eight) hours as needed. 11/14/18   Isla Pence, MD  ondansetron (ZOFRAN) 4 MG tablet Take 1 tablet (4 mg total) by mouth every 6 (six) hours. 03/05/18   Nat Christen, MD  raloxifene (EVISTA) 60 MG tablet Take 1 tablet (60 mg total) by mouth daily. 04/27/19   Janora Norlander, DO  traMADol (ULTRAM) 50 MG tablet Take 0.5-1 tablets (25-50 mg total) by mouth every 12 (twelve) hours as needed for severe pain. 06/15/18   Janora Norlander, DO  atorvastatin (LIPITOR) 40 MG tablet Take 1 tablet (40 mg total) by mouth at bedtime. 11/05/18   Janora Norlander, DO  DULoxetine (CYMBALTA) 60 MG capsule Take 1 capsule (60 mg total) by mouth daily. 11/05/18   Janora Norlander, DO    Family History Family History  Problem Relation  Age of Onset   Arthritis Sister    Hyperlipidemia Sister    Hypertension Sister    Cancer Brother    Diabetes Brother    Heart disease Brother     Social History Social History   Tobacco Use   Smoking status: Never Smoker   Smokeless tobacco: Never Used  Substance Use Topics   Alcohol use: No   Drug use: No     Allergies   Sulfa antibiotics, Codeine, Doxycycline, Erythromycin, Ibuprofen, Levaquin [levofloxacin], and Penicillins   Review of Systems Review of Systems ROS: Statement: All systems negative except as marked or noted in the HPI; Constitutional: Negative  for fever and chills. ; ; Eyes: Negative for eye pain, redness and discharge. ; ; ENMT: Negative for ear pain, hoarseness, nasal congestion, sinus pressure and sore throat. ; ; Cardiovascular: Negative for chest pain, palpitations, diaphoresis, dyspnea and peripheral edema. ; ; Respiratory: Negative for cough, wheezing and stridor. ; ; Gastrointestinal: +N/V/D, abd pain. Negative for blood in stool, hematemesis, jaundice and rectal bleeding. . ; ; Genitourinary: Negative for dysuria, flank pain and hematuria. ; ; Musculoskeletal: Negative for back pain and neck pain. Negative for swelling and trauma.; ; Skin: Negative for pruritus, rash, abrasions, blisters, bruising and skin lesion.; ; Neuro: Negative for headache, lightheadedness and neck stiffness. Negative for weakness, altered level of consciousness, altered mental status, extremity weakness, paresthesias, involuntary movement, seizure and syncope.       Physical Exam Updated Vital Signs BP (!) 134/57 (BP Location: Left Arm)    Pulse 89    Temp 97.7 F (36.5 C) (Oral)    Resp 20    Ht 5' 2"  (1.575 m)    Wt 87.1 kg    SpO2 99%    BMI 35.12 kg/m    Patient Vitals for the past 24 hrs:  BP Temp Temp src Pulse Resp SpO2 Height Weight  05/04/19 2030 121/66 -- -- (!) 122 18 96 % -- --  05/04/19 2000 134/75 -- -- (!) 120 17 92 % -- --  05/04/19 1930 122/61 --  -- (!) 124 -- 95 % -- --  05/04/19 1928 -- -- -- (!) 118 -- 99 % -- --  05/04/19 1916 -- -- -- (!) 130 18 96 % -- --  05/04/19 1914 (!) 151/79 -- -- (!) 129 -- 93 % -- --  05/04/19 1730 105/67 -- -- 84 (!) 22 99 % -- --  05/04/19 1643 -- -- -- -- -- -- 5' 2"  (1.575 m) 87.1 kg  05/04/19 1641 (!) 134/57 97.7 F (36.5 C) Oral 89 20 99 % -- --      Physical Exam 1640: Physical examination:  Nursing notes reviewed; Vital signs and O2 SAT reviewed;  Constitutional: Well developed, Well nourished, In no acute distress; Head:  Normocephalic, atraumatic; Eyes: EOMI, PERRL, No scleral icterus; ENMT: Mouth and pharynx normal, Mucous membranes dry; Neck: Supple, Full range of motion, No lymphadenopathy; Cardiovascular: Regular rate and rhythm, No gallop; Respiratory: Breath sounds clear & equal bilaterally, No wheezes.  Speaking full sentences with ease, Normal respiratory effort/excursion; Chest: Nontender, Movement normal; Abdomen: Soft, +LLQ tenderness to palp. +having BM during HPI.  Nondistended, Normal bowel sounds; Genitourinary: No CVA tenderness; Extremities: Peripheral pulses normal, No tenderness, No edema, No calf edema or asymmetry.; Neuro: AA&Ox3, No facial droop. Speech impediment per baseline. Moves all extremities spontaneously and to command without apparent gross focal motor deficits in extremities.; Skin: Color normal, Warm, Dry.   ED Treatments / Results  Labs (all labs ordered are listed, but only abnormal results are displayed)   EKG EKG Interpretation  Date/Time:  Wednesday May 04 2019 16:33:29 EDT Ventricular Rate:  89 PR Interval:    QRS Duration: 95 QT Interval:  381 QTC Calculation: 464 R Axis:   111 Text Interpretation:  Sinus rhythm Inferior infarct, age indeterminate Baseline wander Artifact When compared with ECG of 07/17/2016 Artifact is now Present Confirmed by Francine Graven [phone]) on 05/04/2019 4:48:26 PM   Radiology   Procedures Procedures  (including critical care time)  Medications Ordered in ED Medications - No data to display   Initial Impression /  Assessment and Plan / ED Course  I have reviewed the triage vital signs and the nursing notes.  Pertinent labs & imaging results that were available during my care of the patient were reviewed by me and considered in my medical decision making (see chart for details).    MDM Reviewed: previous chart, nursing note and vitals Reviewed previous: labs and ECG Interpretation: labs, ECG, x-ray and CT scan    Results for orders placed or performed during the hospital encounter of 05/04/19  Comprehensive metabolic panel  Result Value Ref Range   Sodium 141 135 - 145 mmol/L   Potassium 4.1 3.5 - 5.1 mmol/L   Chloride 112 (H) 98 - 111 mmol/L   CO2 21 (L) 22 - 32 mmol/L   Glucose, Bld 140 (H) 70 - 99 mg/dL   BUN 31 (H) 8 - 23 mg/dL   Creatinine, Ser 1.37 (H) 0.44 - 1.00 mg/dL   Calcium 8.9 8.9 - 10.3 mg/dL   Total Protein 6.2 (L) 6.5 - 8.1 g/dL   Albumin 3.5 3.5 - 5.0 g/dL   AST 22 15 - 41 U/L   ALT 22 0 - 44 U/L   Alkaline Phosphatase 75 38 - 126 U/L   Total Bilirubin 0.4 0.3 - 1.2 mg/dL   GFR calc non Af Amer 37 (L) >60 mL/min   GFR calc Af Amer 42 (L) >60 mL/min   Anion gap 8 5 - 15  Lipase, blood  Result Value Ref Range   Lipase 33 11 - 51 U/L  Lactic acid, plasma  Result Value Ref Range   Lactic Acid, Venous 1.7 0.5 - 1.9 mmol/L  CBC with Differential  Result Value Ref Range   WBC 9.8 4.0 - 10.5 K/uL   RBC 3.65 (L) 3.87 - 5.11 MIL/uL   Hemoglobin 11.1 (L) 12.0 - 15.0 g/dL   HCT 36.9 36.0 - 46.0 %   MCV 101.1 (H) 80.0 - 100.0 fL   MCH 30.4 26.0 - 34.0 pg   MCHC 30.1 30.0 - 36.0 g/dL   RDW 14.1 11.5 - 15.5 %   Platelets 209 150 - 400 K/uL   nRBC 0.0 0.0 - 0.2 %   Neutrophils Relative % 81 %   Neutro Abs 7.9 (H) 1.7 - 7.7 K/uL   Lymphocytes Relative 10 %   Lymphs Abs 1.0 0.7 - 4.0 K/uL   Monocytes Relative 4 %   Monocytes Absolute 0.4 0.1 - 1.0 K/uL    Eosinophils Relative 4 %   Eosinophils Absolute 0.4 0.0 - 0.5 K/uL   Basophils Relative 1 %   Basophils Absolute 0.1 0.0 - 0.1 K/uL   Immature Granulocytes 0 %   Abs Immature Granulocytes 0.03 0.00 - 0.07 K/uL  Urinalysis, Routine w reflex microscopic  Result Value Ref Range   Color, Urine AMBER (A) YELLOW   APPearance HAZY (A) CLEAR   Specific Gravity, Urine 1.017 1.005 - 1.030   pH 5.0 5.0 - 8.0   Glucose, UA NEGATIVE NEGATIVE mg/dL   Hgb urine dipstick NEGATIVE NEGATIVE   Bilirubin Urine NEGATIVE NEGATIVE   Ketones, ur 5 (A) NEGATIVE mg/dL   Protein, ur 100 (A) NEGATIVE mg/dL   Nitrite NEGATIVE NEGATIVE   Leukocytes,Ua NEGATIVE NEGATIVE   RBC / HPF 0-5 0 - 5 RBC/hpf   WBC, UA 0-5 0 - 5 WBC/hpf   Bacteria, UA RARE (A) NONE SEEN   Squamous Epithelial / LPF 0-5 0 - 5   Hyaline Casts, UA PRESENT  Amorphous Crystal PRESENT   POC occult blood, ED  Result Value Ref Range   Fecal Occult Bld NEGATIVE NEGATIVE  Troponin I (High Sensitivity)  Result Value Ref Range   Troponin I (High Sensitivity) 4 <18 ng/L  Troponin I (High Sensitivity)  Result Value Ref Range   Troponin I (High Sensitivity) 5 <18 ng/L   Ct Abdomen Pelvis Wo Contrast Result Date: 05/04/2019 CLINICAL DATA:  Abdominal pain.  Diarrhea. EXAM: CT ABDOMEN AND PELVIS WITHOUT CONTRAST TECHNIQUE: Multidetector CT imaging of the abdomen and pelvis was performed following the standard protocol without IV contrast. COMPARISON:  CT 11/14/2018 FINDINGS: Lower chest: Lung bases are clear.  Large hiatal hernia. Hepatobiliary: Gallbladder is absent. Normal appearance of the liver. Extrahepatic bile duct appears to be prominent and similar to the previous examination. Pancreas: Unremarkable. No pancreatic ductal dilatation or surrounding inflammatory changes. Spleen: Again noted is a low-density lesion involving the superior aspect of the spleen. This lesion roughly measures 3.0 x 2.6 x 2.3 cm. Lesion measured 3.0 x 2.5 x 1.4 cm in  April 2020. Lesion was probably present in 2017 but clearly much smaller. Adrenals/Urinary Tract: Normal adrenal glands. Low-density renal structures probably represent small cysts. No hydronephrosis. Urinary bladder is unremarkable. Negative for kidney stones. Stomach/Bowel: Large hiatal hernia. Normal appearance of the duodenum. Mild inflammatory changes around the sigmoid colon and there are few colonic diverticula involving the sigmoid colon. Mild pericolonic edema in left lower quadrant involving the proximal sigmoid colon and distal descending colon. Mild distention of the cecum is similar to the previous examination. No small bowel dilatation. Vascular/Lymphatic: Atherosclerotic calcifications in the aorta and iliac arteries without aneurysm. No significant lymph node enlargement in the abdomen or pelvis. Reproductive: Again noted is a low-density structure involving the left adnexa measuring roughly 4.2 cm and stable from the exam in April 2020 and minimally enlarged since 2017. Uterus has been removed. Other: Small amount of fluid in the pelvis.  Negative for free air. Musculoskeletal: Chronic compression deformity involving superior endplate of L4. Facet arthropathy in the lumbar spine. IMPRESSION: 1. Trace free fluid in the pelvis and concern for mild inflammatory changes around the sigmoid colon. There are colonic diverticula in this area. Unfortunately, there is significant motion artifact in this area that limits evaluation of the pelvis. Cannot exclude diverticulitis involving the sigmoid colon. No evidence for an abscess collection. 2. Low-density splenic lesion is nonspecific but could be cystic. Lesion may have slightly enlarged since April 2020 but clearly enlarged since 2017. Based on the evidence for enlargement since 2017, consider further characterization of the splenic lesion with MRI or follow-up CT in 3-6 months to ensure stability. 3. Minimal change in the low-density left adnexal lesion.  This probably represents a cystic structure and favor a benign etiology based on the minimal change since 2017. 4. Large hiatal hernia. 5. Probable bilateral renal cysts. Electronically Signed   By: Markus Daft M.D.   On: 05/04/2019 19:19   Dg Chest Port 1 View Result Date: 05/04/2019 CLINICAL DATA:  Nausea vomiting abdominal pain EXAM: PORTABLE CHEST 1 VIEW COMPARISON:  April 17, 2016 FINDINGS: There is mild cardiomegaly. Aortic knob calcifications. There is prominence to the pulmonary vasculature. Air fluid-filled hiatal hernia is present. Degenerative changes in bilateral shoulders. IMPRESSION: Mild cardiomegaly.  Pulmonary vascular congestion. Large hiatal hernia. Electronically Signed   By: Prudencio Pair M.D.   On: 05/04/2019 17:29    Tamara Stein was evaluated in Emergency Department on 05/04/2019 for the symptoms described  in the history of present illness. She was evaluated in the context of the global COVID-19 pandemic, which necessitated consideration that the patient might be at risk for infection with the SARS-CoV-2 virus that causes COVID-19. Institutional protocols and algorithms that pertain to the evaluation of patients at risk for COVID-19 are in a state of rapid change based on information released by regulatory bodies including the CDC and federal and state organizations. These policies and algorithms were followed during the patient's care in the ED.    8:41 PM:  Pt with multiple episodes of N/V and diarrheal stools while in the ED. Stool panels pending, stool is heme negative. Labs per baseline. CT as above; IV abx started. T/C returned from [facility] Dr. Maudie Mercury, case discussed, including:  HPI, pertinent PM/SHx, VS/PE, dx testing, ED course and treatment:  Agreeable to admit.      Final Clinical Impressions(s) / ED Diagnoses   Final diagnoses:  None    ED Discharge Orders    None       Francine Graven, DO 05/10/19 1103

## 2019-05-04 NOTE — ED Triage Notes (Signed)
PT brought in by [facility] today for lower abdominal pain with nausea/vomiting x1 and diarrhea x2 that started around 1300 today after eating lunch. PT was hypotensive 80/60 upon EMS arrival. EMS gave 69m of Zofran in route. CBG 129 with EMS.

## 2019-05-05 ENCOUNTER — Encounter (HOSPITAL_COMMUNITY): Payer: Self-pay

## 2019-05-05 ENCOUNTER — Other Ambulatory Visit: Payer: Self-pay

## 2019-05-05 DIAGNOSIS — D739 Disease of spleen, unspecified: Secondary | ICD-10-CM | POA: Diagnosis not present

## 2019-05-05 DIAGNOSIS — Z8673 Personal history of transient ischemic attack (TIA), and cerebral infarction without residual deficits: Secondary | ICD-10-CM | POA: Diagnosis not present

## 2019-05-05 DIAGNOSIS — Z888 Allergy status to other drugs, medicaments and biological substances status: Secondary | ICD-10-CM | POA: Diagnosis not present

## 2019-05-05 DIAGNOSIS — R404 Transient alteration of awareness: Secondary | ICD-10-CM | POA: Diagnosis not present

## 2019-05-05 DIAGNOSIS — Z6835 Body mass index (BMI) 35.0-35.9, adult: Secondary | ICD-10-CM | POA: Diagnosis not present

## 2019-05-05 DIAGNOSIS — R197 Diarrhea, unspecified: Secondary | ICD-10-CM | POA: Insufficient documentation

## 2019-05-05 DIAGNOSIS — Z8261 Family history of arthritis: Secondary | ICD-10-CM | POA: Diagnosis not present

## 2019-05-05 DIAGNOSIS — K219 Gastro-esophageal reflux disease without esophagitis: Secondary | ICD-10-CM | POA: Diagnosis not present

## 2019-05-05 DIAGNOSIS — N183 Chronic kidney disease, stage 3 (moderate): Secondary | ICD-10-CM | POA: Diagnosis not present

## 2019-05-05 DIAGNOSIS — R112 Nausea with vomiting, unspecified: Secondary | ICD-10-CM | POA: Diagnosis not present

## 2019-05-05 DIAGNOSIS — F419 Anxiety disorder, unspecified: Secondary | ICD-10-CM | POA: Diagnosis present

## 2019-05-05 DIAGNOSIS — G319 Degenerative disease of nervous system, unspecified: Secondary | ICD-10-CM | POA: Diagnosis present

## 2019-05-05 DIAGNOSIS — I129 Hypertensive chronic kidney disease with stage 1 through stage 4 chronic kidney disease, or unspecified chronic kidney disease: Secondary | ICD-10-CM | POA: Diagnosis present

## 2019-05-05 DIAGNOSIS — R1030 Lower abdominal pain, unspecified: Secondary | ICD-10-CM | POA: Diagnosis not present

## 2019-05-05 DIAGNOSIS — D7389 Other diseases of spleen: Secondary | ICD-10-CM | POA: Diagnosis present

## 2019-05-05 DIAGNOSIS — Z7401 Bed confinement status: Secondary | ICD-10-CM | POA: Diagnosis not present

## 2019-05-05 DIAGNOSIS — Z881 Allergy status to other antibiotic agents status: Secondary | ICD-10-CM | POA: Diagnosis not present

## 2019-05-05 DIAGNOSIS — Z8249 Family history of ischemic heart disease and other diseases of the circulatory system: Secondary | ICD-10-CM | POA: Diagnosis not present

## 2019-05-05 DIAGNOSIS — Z9049 Acquired absence of other specified parts of digestive tract: Secondary | ICD-10-CM | POA: Diagnosis not present

## 2019-05-05 DIAGNOSIS — E039 Hypothyroidism, unspecified: Secondary | ICD-10-CM | POA: Diagnosis present

## 2019-05-05 DIAGNOSIS — Z20828 Contact with and (suspected) exposure to other viral communicable diseases: Secondary | ICD-10-CM | POA: Diagnosis present

## 2019-05-05 DIAGNOSIS — E669 Obesity, unspecified: Secondary | ICD-10-CM | POA: Diagnosis present

## 2019-05-05 DIAGNOSIS — Z880 Allergy status to penicillin: Secondary | ICD-10-CM | POA: Diagnosis not present

## 2019-05-05 DIAGNOSIS — K5732 Diverticulitis of large intestine without perforation or abscess without bleeding: Secondary | ICD-10-CM | POA: Diagnosis present

## 2019-05-05 DIAGNOSIS — E785 Hyperlipidemia, unspecified: Secondary | ICD-10-CM | POA: Diagnosis present

## 2019-05-05 DIAGNOSIS — Z882 Allergy status to sulfonamides status: Secondary | ICD-10-CM | POA: Diagnosis not present

## 2019-05-05 DIAGNOSIS — K5792 Diverticulitis of intestine, part unspecified, without perforation or abscess without bleeding: Secondary | ICD-10-CM | POA: Diagnosis not present

## 2019-05-05 DIAGNOSIS — Z66 Do not resuscitate: Secondary | ICD-10-CM | POA: Diagnosis present

## 2019-05-05 DIAGNOSIS — I10 Essential (primary) hypertension: Secondary | ICD-10-CM | POA: Diagnosis not present

## 2019-05-05 DIAGNOSIS — N179 Acute kidney failure, unspecified: Secondary | ICD-10-CM

## 2019-05-05 DIAGNOSIS — Z8349 Family history of other endocrine, nutritional and metabolic diseases: Secondary | ICD-10-CM | POA: Diagnosis not present

## 2019-05-05 LAB — COMPREHENSIVE METABOLIC PANEL
ALT: 112 U/L — ABNORMAL HIGH (ref 0–44)
AST: 131 U/L — ABNORMAL HIGH (ref 15–41)
Albumin: 3 g/dL — ABNORMAL LOW (ref 3.5–5.0)
Alkaline Phosphatase: 63 U/L (ref 38–126)
Anion gap: 4 — ABNORMAL LOW (ref 5–15)
BUN: 32 mg/dL — ABNORMAL HIGH (ref 8–23)
CO2: 23 mmol/L (ref 22–32)
Calcium: 8.6 mg/dL — ABNORMAL LOW (ref 8.9–10.3)
Chloride: 113 mmol/L — ABNORMAL HIGH (ref 98–111)
Creatinine, Ser: 1.48 mg/dL — ABNORMAL HIGH (ref 0.44–1.00)
GFR calc Af Amer: 39 mL/min — ABNORMAL LOW (ref >60)
GFR calc non Af Amer: 33 mL/min — ABNORMAL LOW (ref >60)
Glucose, Bld: 120 mg/dL — ABNORMAL HIGH (ref 70–99)
Potassium: 4.1 mmol/L (ref 3.5–5.1)
Sodium: 140 mmol/L (ref 135–145)
Total Bilirubin: 0.3 mg/dL (ref 0.3–1.2)
Total Protein: 5.6 g/dL — ABNORMAL LOW (ref 6.5–8.1)

## 2019-05-05 LAB — CBC
HCT: 31.2 % — ABNORMAL LOW (ref 36.0–46.0)
Hemoglobin: 9.5 g/dL — ABNORMAL LOW (ref 12.0–15.0)
MCH: 30.5 pg (ref 26.0–34.0)
MCHC: 30.4 g/dL (ref 30.0–36.0)
MCV: 100.3 fL — ABNORMAL HIGH (ref 80.0–100.0)
Platelets: 190 10*3/uL (ref 150–400)
RBC: 3.11 MIL/uL — ABNORMAL LOW (ref 3.87–5.11)
RDW: 14.2 % (ref 11.5–15.5)
WBC: 20.9 10*3/uL — ABNORMAL HIGH (ref 4.0–10.5)
nRBC: 0 % (ref 0.0–0.2)

## 2019-05-05 LAB — URINE CULTURE: Culture: NO GROWTH

## 2019-05-05 MED ORDER — ATORVASTATIN CALCIUM 40 MG PO TABS: 40.0000 mg | ORAL_TABLET | Freq: Every day | ORAL | Status: DC

## 2019-05-05 MED ORDER — PIPERACILLIN-TAZOBACTAM 3.375 G IVPB
3.3750 g | Freq: Three times a day (TID) | INTRAVENOUS | Status: DC
  Administered 2019-05-05 – 2019-05-07 (×6): 3.375 g via INTRAVENOUS
  Filled 2019-05-05 (×5): qty 50

## 2019-05-05 MED ORDER — MORPHINE SULFATE (PF) 2 MG/ML IV SOLN
1.0000 mg | INTRAVENOUS | Status: DC | PRN
  Administered 2019-05-05 – 2019-05-07 (×3): 1 mg via INTRAVENOUS
  Filled 2019-05-05 (×4): qty 1

## 2019-05-05 MED ORDER — ASPIRIN EC 81 MG PO TBEC
81.0000 mg | DELAYED_RELEASE_TABLET | Freq: Every morning | ORAL | Status: DC
  Administered 2019-05-05 – 2019-05-07 (×3): 81 mg via ORAL
  Filled 2019-05-05 (×3): qty 1

## 2019-05-05 MED ORDER — METRONIDAZOLE IN NACL 5-0.79 MG/ML-% IV SOLN: 500.0000 mg | Freq: Three times a day (TID) | INTRAVENOUS | Status: DC

## 2019-05-05 MED ORDER — LEVOTHYROXINE SODIUM 50 MCG PO TABS
50.0000 ug | ORAL_TABLET | Freq: Every day | ORAL | Status: DC
  Administered 2019-05-05 – 2019-05-06 (×2): 50 ug via ORAL
  Filled 2019-05-05 (×2): qty 1

## 2019-05-05 MED ORDER — GRX ANALGESIC BALM EX OINT
TOPICAL_OINTMENT | CUTANEOUS | Status: DC | PRN
  Administered 2019-05-06: 01:00:00 via TOPICAL
  Filled 2019-05-05: qty 85
  Filled 2019-05-05: qty 28

## 2019-05-05 MED ORDER — SODIUM CHLORIDE 0.9 % IV SOLN: 2.0000 g | INTRAVENOUS | Status: DC

## 2019-05-05 MED ORDER — RISAQUAD PO CAPS
1.0000 | ORAL_CAPSULE | Freq: Every day | ORAL | Status: DC
  Administered 2019-05-05 – 2019-05-07 (×3): 1 via ORAL
  Filled 2019-05-05 (×5): qty 1

## 2019-05-05 MED ORDER — PANTOPRAZOLE SODIUM 40 MG PO TBEC
40.0000 mg | DELAYED_RELEASE_TABLET | Freq: Every day | ORAL | Status: DC
  Administered 2019-05-05 – 2019-05-07 (×3): 40 mg via ORAL
  Filled 2019-05-05 (×3): qty 1

## 2019-05-05 MED ORDER — DULOXETINE HCL 60 MG PO CPEP
60.0000 mg | ORAL_CAPSULE | Freq: Every morning | ORAL | Status: DC
  Administered 2019-05-05 – 2019-05-07 (×3): 60 mg via ORAL
  Filled 2019-05-05: qty 2
  Filled 2019-05-05 (×2): qty 1

## 2019-05-05 MED ORDER — INFLUENZA VAC A&B SA ADJ QUAD 0.5 ML IM PRSY
0.5000 mL | PREFILLED_SYRINGE | INTRAMUSCULAR | Status: DC
  Filled 2019-05-05: qty 0.5

## 2019-05-05 MED ORDER — LORATADINE 10 MG PO TABS
10.0000 mg | ORAL_TABLET | Freq: Every day | ORAL | Status: DC
  Administered 2019-05-05 – 2019-05-07 (×3): 10 mg via ORAL
  Filled 2019-05-05 (×3): qty 1

## 2019-05-05 MED ORDER — AMLODIPINE BESYLATE 5 MG PO TABS
10.0000 mg | ORAL_TABLET | Freq: Every morning | ORAL | Status: DC
  Administered 2019-05-05 – 2019-05-07 (×3): 10 mg via ORAL
  Filled 2019-05-05 (×3): qty 2

## 2019-05-05 MED ORDER — ALIGN PREBIOTIC-PROBIOTIC 5-1.25 MG-GM PO CHEW: 1.0000 | CHEWABLE_TABLET | Freq: Every day | ORAL | Status: DC

## 2019-05-05 MED ORDER — MECLIZINE HCL 12.5 MG PO TABS: 25.0000 mg | ORAL_TABLET | Freq: Three times a day (TID) | ORAL | Status: DC | PRN

## 2019-05-05 MED ORDER — PIPERACILLIN-TAZOBACTAM 3.375 G IVPB 30 MIN
3.3750 g | Freq: Once | INTRAVENOUS | Status: AC
  Administered 2019-05-05: 10:00:00 3.375 g via INTRAVENOUS
  Filled 2019-05-05: qty 50

## 2019-05-05 NOTE — ED Notes (Signed)
Pt given clear liquid tray. Pt did not tolerate and vomited liquids

## 2019-05-05 NOTE — Care Management Obs Status (Signed)
[facility] NOTIFICATION   Patient Details  Name: Tamara Stein MRN: [id number] Date of Birth: 06/27/1941   Medicare Observation Status Notification Given:  Yes(patient was unable to sign with stylus due to contractures-used her fingernail to make her mark)    Trish Mage, LCSW 05/05/2019, 11:26 AM

## 2019-05-05 NOTE — Progress Notes (Signed)
Pharmacy Antibiotic Note  Tamara Stein is a 78 y.o. female admitted on 05/04/2019 with intra-abdominal infection.  Pharmacy has been consulted for zosn dosing.  Plan: Zosyn 3.375gm IV now over 30 mins, then Zosyn 3.375g IV q8h (4 hour infusion).  F/U cxs, clinical progress Monitor V/S, labs  Height: 5' 2"  (157.5 cm) Weight: 192 lb (87.1 kg) IBW/kg (Calculated) : 50.1  Temp (24hrs), Avg:98 F (36.7 C), Min:97.7 F (36.5 C), Max:98.3 F (36.8 C)  Recent Labs  Lab 05/04/19 1653 05/04/19 1931 05/05/19 0322  WBC 9.8  --  20.9*  CREATININE 1.37*  --  1.48*  LATICACIDVEN 1.7 1.8  --     Estimated Creatinine Clearance: 31.6 mL/min (A) (by C-G formula based on SCr of 1.48 mg/dL (H)).    Allergies  Allergen Reactions  . Sulfa Antibiotics Anaphylaxis    Facial swelling   . Codeine Other (See Comments)    BLACK OUTS  . Doxycycline Nausea Only  . Erythromycin Swelling  . Ibuprofen Other (See Comments)    Stomach pain, muscle spasm, GI bleeding  . Levaquin [Levofloxacin] Other (See Comments)    Insomnia/trouble breathing  . Penicillins Diarrhea    Has patient had a PCN reaction causing immediate rash, facial/tongue/throat swelling, SOB or lightheadedness with hypotension: No Has patient had a PCN reaction causing severe rash involving mucus membranes or skin necrosis: No Has patient had a PCN reaction that required hospitalization Unknown Has patient had a PCN reaction occurring within the last 10 years: No If all of the above answers are "NO", then may proceed with Cephalosporin use.     Antimicrobials this admission: Zosyn 9/24 >>   Microbiology results: 9/24 UCx: pending  Thank you for allowing pharmacy to be a part of this patient's care.  Isac Sarna, BS Pharm D, [place] Clinical Pharmacist Pager [phone] 05/05/2019 10:39 AM

## 2019-05-05 NOTE — Consult Note (Addendum)
Referring Provider: Jonetta Osgood, MD Primary Care Physician:  Janora Norlander, DO Primary Gastroenterologist:  Dr. Arta Silence  Reason for Consultation:    HPI: Tamara Stein is a 78 y.o. 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  Patient resides with sister and brother-in-law.  She reports chronic history of intermittent explosive diarrhea which has been progressive over the past few months. For months she has had lower abdominal pain, right side greater than left.  May have a couple of explosive stools daily but over the past couple of days had increased stools.  No melena or rectal bleeding.  Episode of vomiting the day of presentation.  Denies fever.  She had antibiotics for UTI several months ago.  No ill contacts.  No significant reflux symptoms on omeprazole.  Questionable weight loss per patient.  Prior colonoscopy around 2202 complicated by aspiration and required hospitalization.  Patient states study was unremarkable.  In the ED chest x-ray showed pulmonary vascular congestion, mild cardiomegaly, large hiatal hernia.  CT abdomen pelvis without contrast, large hiatal hernia, mild inflammatory changes around the sigmoid colon with few colonic diverticula in the area, mild pericolonic edema in the left lower quadrant involving the proximal sigmoid and distal descending colon, mild distention of the cecum similar to previous exams, no small bowel dilatation.  Cannot exclude diverticulitis but exam limited due to significant motion artifact.  Noted to have a low-density splenic lesion, nonspecific, could be cystic but enlarging since 2017.  MRI recommended in 3 to 6 months.  Low-density left adnexal lesion with minimal change, stable since  2017 and favored to be benign.  C. difficile antigen and toxin negative.  GI pathogen panel pending.  Lactic acid 1.7, white blood cell count 9800, hemoglobin 11.1.  Heme-negative stool.  Labs from today indicate bump in AST from 22-1 31, ALT from 22-1 12, total bilirubin alkaline phosphatase are normal.  Hemoglobin 9.5, white blood cell count 20,900 up from 9800.   Prior to Admission medications   Medication Sig Start Date End Date Taking? Authorizing Provider  amLODipine (NORVASC) 10 MG tablet TAKE 1 TABLET DAILY FOR    BLOOD PRESSURE Patient taking differently: Take 10 mg by mouth every morning.  04/26/19  Yes Gottschalk, Ashly M, DO  atorvastatin (LIPITOR) 40 MG tablet TAKE 1 TABLET AT BEDTIME Patient taking differently: Take 40 mg by mouth at bedtime.  04/26/19  Yes Ronnie Doss M, DO  ciprofloxacin (CIPRO) 250 MG tablet Take 125 mg by mouth at bedtime.   Yes [provider]  diclofenac (VOLTAREN) 50 MG EC tablet TAKE 1 TABLET BY MOUTH (50MG TOTAL) THREE TIMES DAILY Patient taking differently: Take 50-100 mg by mouth See admin instructions. 100MG IN THE MORNING AND 50MG AT BEDTIME 04/27/19  Yes Gottschalk, Ashly M, DO  DULoxetine (CYMBALTA) 60 MG capsule Take 1 capsule (60 mg total) by mouth daily. Patient taking differently: Take 60 mg by mouth every morning.  04/27/19  Yes Gottschalk, Leatrice Jewels M, DO  furosemide (LASIX) 20 MG tablet Take 1 tablet (20 mg total) by mouth daily as needed. As needed for swelling 08/25/17  Yes Eustaquio Maize, MD  levothyroxine (SYNTHROID) 50 MCG tablet TAKE 1 TABLET ONCE DAILY Patient taking differently: Take 50 mcg by mouth daily before breakfast.  04/26/19  Yes Gottschalk, Ashly M, DO  lisinopril (  ZESTRIL) 30 MG tablet TAKE 1 TABLET DAILY Patient taking differently: Take 30 mg by mouth daily before breakfast.  04/26/19  Yes Gottschalk, Ashly M, DO  meclizine (ANTIVERT) 25 MG tablet Take 25 mg by mouth 3 (three) times daily as needed for dizziness.    Yes [provider]  omeprazole (PRILOSEC) 40 MG capsule TAKE 1 CAPSULE DAILY Patient taking differently: Take 40 mg by mouth daily before breakfast.  04/26/19  Yes Gottschalk, Ashly M, DO  ondansetron (ZOFRAN ODT) 4 MG disintegrating tablet Take 1 tablet (4 mg total) by mouth every 8 (eight) hours as needed. 11/14/18  Yes Isla Pence, MD  raloxifene (EVISTA) 60 MG tablet Take 1 tablet (60 mg total) by mouth daily. Patient taking differently: Take 60 mg by mouth every morning.  04/27/19  Yes Ronnie Doss M, DO  aspirin EC 81 MG tablet Take 81 mg by mouth every morning.     [provider]  Bacillus Coagulans-Inulin (ALIGN PREBIOTIC-PROBIOTIC) 5-1.25 MG-GM CHEW Chew 1 tablet by mouth daily. 11/14/18   Isla Pence, MD  Calcium Carbonate-Vitamin D (CALTRATE 600+D PO) Take 1 tablet by mouth daily.    [provider]  cetirizine (ZYRTEC) 10 MG chewable tablet Chew 10 mg by mouth daily.    [provider]  Multiple Vitamin (MULTIVITAMIN WITH MINERALS) TABS tablet Take 1 tablet by mouth daily.    [provider]  atorvastatin (LIPITOR) 40 MG tablet Take 1 tablet (40 mg total) by mouth at bedtime. 11/05/18   Janora Norlander, DO  DULoxetine (CYMBALTA) 60 MG capsule Take 1 capsule (60 mg total) by mouth daily. 11/05/18   Janora Norlander, DO    Current Facility-Administered Medications  Medication Dose Route Frequency Provider Last Rate Last Dose  . acetaminophen (TYLENOL) tablet 650 mg  650 mg Oral Q6H PRN Jani Gravel, MD       Or  . acetaminophen (TYLENOL) suppository 650 mg  650 mg Rectal Q6H PRN Jani Gravel, MD      . acidophilus (RISAQUAD) capsule 1 capsule  1 capsule Oral Daily Ghimire, Shanker M, MD      . amLODipine (NORVASC) tablet 10 mg  10 mg Oral q morning - 10a Jani Gravel, MD   10 mg at 05/05/19 1024  . aspirin EC tablet 81 mg  81 mg Oral q morning - 10a Jani Gravel, MD   81 mg at 05/05/19 1024  . DULoxetine (CYMBALTA) DR capsule 60 mg   60 mg Oral q morning - 10a Jani Gravel, MD   60 mg at 05/05/19 1026  . enoxaparin (LOVENOX) injection 40 mg  40 mg Subcutaneous Q24H Jani Gravel, MD   40 mg at 05/05/19 0032  . [START ON 05/06/2019] levothyroxine (SYNTHROID) tablet 50 mcg  50 mcg Oral QAC breakfast Jani Gravel, MD   50 mcg at 05/05/19 1025  . loratadine (CLARITIN) tablet 10 mg  10 mg Oral Daily Jani Gravel, MD   10 mg at 05/05/19 1026  . meclizine (ANTIVERT) tablet 25 mg  25 mg Oral TID PRN Jani Gravel, MD      . morphine 2 MG/ML injection 1 mg  1 mg Intravenous Q4H PRN Jonetta Osgood, MD   1 mg at 05/05/19 0928  . ondansetron (ZOFRAN) injection 4 mg  4 mg Intravenous Q1H PRN Francine Graven, DO   4 mg at 05/04/19 1917  . pantoprazole (PROTONIX) EC tablet 40 mg  40 mg Oral Daily Jani Gravel, MD   40 mg  at 05/05/19 1025  . piperacillin-tazobactam (ZOSYN) IVPB 3.375 g  3.375 g Intravenous Q8H Ghimire, Henreitta Leber, MD       Current Outpatient Medications  Medication Sig Dispense Refill  . amLODipine (NORVASC) 10 MG tablet TAKE 1 TABLET DAILY FOR    BLOOD PRESSURE (Patient taking differently: Take 10 mg by mouth every morning. ) 90 tablet 1  . atorvastatin (LIPITOR) 40 MG tablet TAKE 1 TABLET AT BEDTIME (Patient taking differently: Take 40 mg by mouth at bedtime. ) 90 tablet 0  . ciprofloxacin (CIPRO) 250 MG tablet Take 125 mg by mouth at bedtime.    . diclofenac (VOLTAREN) 50 MG EC tablet TAKE 1 TABLET BY MOUTH (50MG TOTAL) THREE TIMES DAILY (Patient taking differently: Take 50-100 mg by mouth See admin instructions. 100MG IN THE MORNING AND 50MG AT BEDTIME) 270 tablet 1  . DULoxetine (CYMBALTA) 60 MG capsule Take 1 capsule (60 mg total) by mouth daily. (Patient taking differently: Take 60 mg by mouth every morning. ) 90 capsule 0  . furosemide (LASIX) 20 MG tablet Take 1 tablet (20 mg total) by mouth daily as needed. As needed for swelling 90 tablet 1  . levothyroxine (SYNTHROID) 50 MCG tablet TAKE 1 TABLET ONCE DAILY (Patient taking  differently: Take 50 mcg by mouth daily before breakfast. ) 90 tablet 1  . lisinopril (ZESTRIL) 30 MG tablet TAKE 1 TABLET DAILY (Patient taking differently: Take 30 mg by mouth daily before breakfast. ) 90 tablet 1  . meclizine (ANTIVERT) 25 MG tablet Take 25 mg by mouth 3 (three) times daily as needed for dizziness.    Marland Kitchen omeprazole (PRILOSEC) 40 MG capsule TAKE 1 CAPSULE DAILY (Patient taking differently: Take 40 mg by mouth daily before breakfast. ) 90 capsule 1  . ondansetron (ZOFRAN ODT) 4 MG disintegrating tablet Take 1 tablet (4 mg total) by mouth every 8 (eight) hours as needed. 10 tablet 0  . raloxifene (EVISTA) 60 MG tablet Take 1 tablet (60 mg total) by mouth daily. (Patient taking differently: Take 60 mg by mouth every morning. ) 90 tablet 1  . aspirin EC 81 MG tablet Take 81 mg by mouth every morning.     . Bacillus Coagulans-Inulin (ALIGN PREBIOTIC-PROBIOTIC) 5-1.25 MG-GM CHEW Chew 1 tablet by mouth daily. 30 tablet 0  . Calcium Carbonate-Vitamin D (CALTRATE 600+D PO) Take 1 tablet by mouth daily.    . cetirizine (ZYRTEC) 10 MG chewable tablet Chew 10 mg by mouth daily.    . Multiple Vitamin (MULTIVITAMIN WITH MINERALS) TABS tablet Take 1 tablet by mouth daily.      Allergies as of 05/04/2019 - Review Complete 05/04/2019  Allergen Reaction Noted  . Sulfa antibiotics Anaphylaxis 03/25/2014  . Codeine Other (See Comments) 03/25/2014  . Doxycycline Nausea Only 05-12-202019  . Erythromycin Swelling 01/21/2015  . Ibuprofen Other (See Comments) 04/26/2016  . Levaquin [levofloxacin] Other (See Comments) 07/13/2018  . Penicillins Diarrhea 03/25/2014    Past Medical History:  Diagnosis Date  . Allergy   . Anxiety   . Cataract   . Cerebellar degeneration ()   . Chronic kidney disease   . Chronic knee pain   . Depression   . GERD (gastroesophageal reflux disease)   . Hyperlipidemia   . Hypertension   . Thyroid disease     Past Surgical History:  Procedure Laterality Date   . ABDOMINAL HYSTERECTOMY    . CHOLECYSTECTOMY    . MOUTH SURGERY    . RIGHT ELBOW  Family History  Problem Relation Age of Onset  . Arthritis Sister   . Hyperlipidemia Sister   . Hypertension Sister   . Cancer Brother   . Diabetes Brother   . Heart disease Brother     Social History   Socioeconomic History  . Marital status: Widowed    Spouse name: Not on file  . Number of children: Not on file  . Years of education: Not on file  . Highest education level: Not on file  Occupational History  . Not on file  Social Needs  . Financial resource strain: Not hard at all  . Food insecurity    Worry: Never true    Inability: Never true  . Transportation needs    Medical: Not on file    Non-medical: Not on file  Tobacco Use  . Smoking status: Never Smoker  . Smokeless tobacco: Never Used  Substance and Sexual Activity  . Alcohol use: No  . Drug use: No  . Sexual activity: Never  Lifestyle  . Physical activity    Days per week: Not on file    Minutes per session: Not on file  . Stress: Not on file  Relationships  . Social Herbalist on phone: Not on file    Gets together: Not on file    Attends religious service: Not on file    Active member of club or organization: Not on file    Attends meetings of clubs or organizations: Not on file    Relationship status: Not on file  . Intimate partner violence    Fear of current or ex partner: Not on file    Emotionally abused: Not on file    Physically abused: Not on file    Forced sexual activity: Not on file  Other Topics Concern  . Not on file  Social History Narrative  . Not on file     ROS:  General: Negative for anorexia, weight loss, fever, chills, fatigue, +weakness. Eyes: Negative for vision changes.  ENT: Negative for hoarseness, difficulty swallowing , nasal congestion. CV: Negative for chest pain, angina, palpitations, dyspnea on exertion, peripheral edema.  Respiratory: Negative for  dyspnea at rest, dyspnea on exertion, cough, sputum, wheezing.  GI: See history of present illness. GU:  Negative for dysuria, hematuria, urinary incontinence, urinary frequency, nocturnal urination.  MS: Negative for joint pain, low back pain.  Derm: Negative for rash or itching.  Neuro: Negative for abnormal sensation, seizure, frequent headaches, memory loss, confusion. Chronic weakness, immobility Psych: Negative for anxiety, depression, suicidal ideation, hallucinations.  Endo: Negative for unusual weight change.  Heme: Negative for bruising or bleeding. Allergy: Negative for rash or hives.       Physical Examination: Vital signs in last 24 hours: Temp:  [97.7 F (36.5 C)-98.3 F (36.8 C)] 98.3 F (36.8 C) (09/24 0851) Pulse Rate:  [84-130] 90 (09/24 1000) Resp:  [14-29] 18 (09/24 1000) BP: (104-151)/(46-79) 139/73 (09/24 1000) SpO2:  [92 %-100 %] 97 % (09/24 1000) Weight:  [87.1 kg] 87.1 kg (09/23 1643)    General: chronically ill appearing female in nad. Speech difficult to understand at times.  Head: Normocephalic, atraumatic.   Eyes: Conjunctiva pink, no icterus. Mouth: Oropharyngeal mucosa moist and pink , no lesions erythema or exudate. Neck: Supple without thyromegaly, masses, or lymphadenopathy.  Lungs: Clear to auscultation bilaterally.  Heart: Regular rate and rhythm, no murmurs rubs or gallops.  Abdomen: Bowel sounds are normal, mild lower abdominal tenderness,  nondistended, no hepatosplenomegaly or masses, no abdominal bruits or    hernia , no rebound or guarding.   Rectal: not performed Extremities: trace pedal edema bilaterally. no clubbing, deformity.  Neuro: Alert and oriented x 4 , grossly normal neurologically.  Skin: Warm and dry, no rash or jaundice.   Psych: Alert and cooperative, normal mood and affect.        Intake/Output from previous day: 09/23 0701 - 09/24 0700 In: 1099.1 [IV Piggyback:1099.1] Out: -  Intake/Output this shift: No  intake/output data recorded.  Lab Results: CBC Recent Labs    05/04/19 1653 05/05/19 0322  WBC 9.8 20.9*  HGB 11.1* 9.5*  HCT 36.9 31.2*  MCV 101.1* 100.3*  PLT 209 190   BMET Recent Labs    05/04/19 1653 05/05/19 0322  NA 141 140  K 4.1 4.1  CL 112* 113*  CO2 21* 23  GLUCOSE 140* 120*  BUN 31* 32*  CREATININE 1.37* 1.48*  CALCIUM 8.9 8.6*   LFT Recent Labs    05/04/19 1653 05/05/19 0322  BILITOT 0.4 0.3  ALKPHOS 75 63  AST 22 131*  ALT 22 112*  PROT 6.2* 5.6*  ALBUMIN 3.5 3.0*    Lipase Recent Labs    05/04/19 1653  LIPASE 33    PT/INR No results for input(s): LABPROT, INR in the last 72 hours.    Imaging Studies: Ct Abdomen Pelvis Wo Contrast  Result Date: 05/04/2019 CLINICAL DATA:  Abdominal pain.  Diarrhea. EXAM: CT ABDOMEN AND PELVIS WITHOUT CONTRAST TECHNIQUE: Multidetector CT imaging of the abdomen and pelvis was performed following the standard protocol without IV contrast. COMPARISON:  CT 11/14/2018 FINDINGS: Lower chest: Lung bases are clear.  Large hiatal hernia. Hepatobiliary: Gallbladder is absent. Normal appearance of the liver. Extrahepatic bile duct appears to be prominent and similar to the previous examination. Pancreas: Unremarkable. No pancreatic ductal dilatation or surrounding inflammatory changes. Spleen: Again noted is a low-density lesion involving the superior aspect of the spleen. This lesion roughly measures 3.0 x 2.6 x 2.3 cm. Lesion measured 3.0 x 2.5 x 1.4 cm in April 2020. Lesion was probably present in 2017 but clearly much smaller. Adrenals/Urinary Tract: Normal adrenal glands. Low-density renal structures probably represent small cysts. No hydronephrosis. Urinary bladder is unremarkable. Negative for kidney stones. Stomach/Bowel: Large hiatal hernia. Normal appearance of the duodenum. Mild inflammatory changes around the sigmoid colon and there are few colonic diverticula involving the sigmoid colon. Mild pericolonic edema in  left lower quadrant involving the proximal sigmoid colon and distal descending colon. Mild distention of the cecum is similar to the previous examination. No small bowel dilatation. Vascular/Lymphatic: Atherosclerotic calcifications in the aorta and iliac arteries without aneurysm. No significant lymph node enlargement in the abdomen or pelvis. Reproductive: Again noted is a low-density structure involving the left adnexa measuring roughly 4.2 cm and stable from the exam in April 2020 and minimally enlarged since 2017. Uterus has been removed. Other: Small amount of fluid in the pelvis.  Negative for free air. Musculoskeletal: Chronic compression deformity involving superior endplate of L4. Facet arthropathy in the lumbar spine. IMPRESSION: 1. Trace free fluid in the pelvis and concern for mild inflammatory changes around the sigmoid colon. There are colonic diverticula in this area. Unfortunately, there is significant motion artifact in this area that limits evaluation of the pelvis. Cannot exclude diverticulitis involving the sigmoid colon. No evidence for an abscess collection. 2. Low-density splenic lesion is nonspecific but could be cystic. Lesion may have slightly  enlarged since April 2020 but clearly enlarged since 2017. Based on the evidence for enlargement since 2017, consider further characterization of the splenic lesion with MRI or follow-up CT in 3-6 months to ensure stability. 3. Minimal change in the low-density left adnexal lesion. This probably represents a cystic structure and favor a benign etiology based on the minimal change since 2017. 4. Large hiatal hernia. 5. Probable bilateral renal cysts. Electronically Signed   By: Markus Daft M.D.   On: 05/04/2019 19:19   Dg Chest Port 1 View  Result Date: 05/04/2019 CLINICAL DATA:  Nausea vomiting abdominal pain EXAM: PORTABLE CHEST 1 VIEW COMPARISON:  April 17, 2016 FINDINGS: There is mild cardiomegaly. Aortic knob calcifications. There is  prominence to the pulmonary vasculature. Air fluid-filled hiatal hernia is present. Degenerative changes in bilateral shoulders. IMPRESSION: Mild cardiomegaly.  Pulmonary vascular congestion. Large hiatal hernia. Electronically Signed   By: Prudencio Pair M.D.   On: 05/04/2019 17:29  [4 week]   Impression: 78 year old female with cerebellar degeneration presenting with acute on chronic diarrhea associated with lower abdominal pain and vomiting.   Abdominal pain: Lower abdominal pain right greater than left.  Acute on chronic.  Typically associated with diarrhea.  Patient reports intermittent explosive diarrhea for months. Yesterday with vomiting as well.  C. difficile negative.  GI pathogen panel pending.  CT suggestive of sigmoid diverticulitis limited due to motion artifact.  White blood cell count increased today.  Abdominal exam with mild lower abdominal tenderness but does not appear to be acute abdomen. Patient reports prior cholecystectomy.   Transaminitis: New since admission. Monitor for now.   Splenic lesion, consider repeat imaging in 3-6 months to document stability, favor MRI if patient can tolerate exam and remain still for imaging otherwise, CT.  Plan: 1. F/U stool studies.  2. Continue IV Zosyn.  3. Repeat CBC, LFTs in am.  4. Repeat imaging of spleen in 3-6 months, MRI vs CT.   We would like to thank you for the opportunity to participate in the care of Aitana Burry.  Laureen Ochs. Bernarda Caffey [facility] [phone] 9/24/202012:55 PM    LOS: 0 days     Addendum: reviewed CT with Dr. Thornton Papas. Appendix not seen. Long segment of descending colon wall thickening/stranding most c/w colitis. 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   Laureen Ochs. Bernarda Caffey [facility] [phone] 9/24/20203:15 PM

## 2019-05-05 NOTE — Progress Notes (Addendum)
PROGRESS NOTE        PATIENT DETAILS Name: Tamara Stein Age: 78 y.o. Sex: female Date of Birth: 10-19-1940 Admit Date: 05/04/2019 Admitting Physician No admitting provider for patient encounter. RJJ:OACZYSAYTK, Koleen Distance, DO  Brief Narrative: Patient is a 78 y.o. 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  CT abdomen suggestive of possible diverticulitis.  See below for further details.  Subjective: Complains of constant lower abdominal pain-appears essentially unchanged compared to yesterday.  Diarrhea seems to be better.  Assessment/Plan: Probable diverticulitis: Complains of lower abdominal pain-mostly on the right side-however CT abdomen somewhat suggestive of diverticulitis.  Patient is s/p appendectomy and cholecystectomy in the remote past.  Significant worsening of leukocytosis-however abdominal exam is not suggestive of peritonitis.  Continue clear liquids-change antimicrobial therapy to Zosyn.  We will touch base with GI-as not sure if diverticutlitis fits the clinical picture?  Diarrhea: Seems to have improved-C. difficile PCR negative-await GI pathogen panel.  Suspect related to above.  AKI on CKD stage III: Likely hemodynamically mediated-supportive care-hold lisinopril.  Transaminitis: Supportive care for now-if worsens-we can initiate further work-up  HTN: Controlled-continue Norvasc, and Lasix.  Hold lisinopril due to worsening AKI  Dyslipidemia: Hold Lipitor due to worsening elevated liver enzymes  History of CVA/cerebellar degeneration: No obvious new focal neurological deficits-is very frail/deconditioned at baseline-and mostly bed to wheelchair bound.  Continue aspirin/statin  Hypothyroidism: Continue levothyroxine  GERD continue  PPI  Anxiety/depression continue Cymbalta  Obesity: Estimated body mass index is 35.12 kg/m as calculated from the following:   Height as of this encounter: 5' 2"  (1.575 m).   Weight as of this encounter: 87.1 kg.   Diet: Diet Order            Diet clear liquid Room service appropriate? Yes; Fluid consistency: Thin  Diet effective now               DVT Prophylaxis: Prophylactic Lovenox   Code Status:  DNR  Family Communication: Sister over the phone  Disposition Plan: Remain inpatient  Barriers to discharge: Diverticulitis with worsening leukocytosis-remains on clear liquids and intravenous antibiotics.  Antimicrobial agents: Anti-infectives (From admission, onward)   Start     Dose/Rate Route Frequency Ordered Stop   05/06/19 2000  cefTRIAXone (ROCEPHIN) 2 g in sodium chloride 0.9 % 100 mL IVPB  Status:  Discontinued     2 g 200 mL/hr over 30 Minutes Intravenous Every 24 hours 05/05/19 0003 05/05/19 0008   05/06/19 0600  metroNIDAZOLE (FLAGYL) IVPB 500 mg  Status:  Discontinued     500 mg 100 mL/hr over 60 Minutes Intravenous Every 8 hours 05/05/19 0002 05/05/19 0904   05/05/19 2000  cefTRIAXone (ROCEPHIN) 2 g in sodium chloride 0.9 % 100 mL IVPB  Status:  Discontinued     2 g 200 mL/hr over 30 Minutes Intravenous Every 24 hours 05/05/19 0008 05/05/19 0904   05/05/19 0945  piperacillin-tazobactam (ZOSYN) IVPB 3.375 g     3.375 g 100 mL/hr over 30 Minutes Intravenous  Once 05/05/19 0937     05/04/19 2045  cefTRIAXone (ROCEPHIN) 2 g in sodium chloride 0.9 % 100 mL IVPB     2 g 200 mL/hr over 30 Minutes Intravenous  Once  05/04/19 2037 05/04/19 2140   05/04/19 2045  metroNIDAZOLE (FLAGYL) IVPB 500 mg     500 mg 100 mL/hr over 60 Minutes Intravenous  Once 05/04/19 2037 05/04/19 2354      Procedures: None  CONSULTS:  GI  Time spent: 25- minutes-Greater than 50% of this time was spent in counseling, explanation of diagnosis, planning of further management,  and coordination of care.  MEDICATIONS: Scheduled Meds:  enoxaparin (LOVENOX) injection  40 mg Subcutaneous Q24H   Continuous Infusions:  sodium chloride 100 mL/hr at 05/05/19 0336   piperacillin-tazobactam (ZOSYN)  IV     PRN Meds:.acetaminophen **OR** acetaminophen, morphine injection, ondansetron   PHYSICAL EXAM: Vital signs: Vitals:   05/05/19 0500 05/05/19 0530 05/05/19 0851 05/05/19 0900  BP: (!) 113/56 118/60 (!) 150/73 (!) 141/74  Pulse: 88 88 91 94  Resp: (!) 29 (!) 21 18 14   Temp:   98.3 F (36.8 C)   TempSrc:   Oral   SpO2: 96% 95% 95% 100%  Weight:      Height:       Filed Weights   05/04/19 1643  Weight: 87.1 kg   Body mass index is 35.12 kg/m.   Gen Exam:Alert awake-not in any distress HEENT:atraumatic, normocephalic Chest: B/L clear to auscultation anteriorly CVS:S1S2 regular Abdomen: Tender in the lower abdomen bilaterally including midline-seems to have more right-sided tenderness than the left.  But no peritoneal signs. Extremities:no edema Neurology: Non focal-but generalized weakness. Skin: no rash  I have personally reviewed following labs and imaging studies  LABORATORY DATA: CBC: Recent Labs  Lab 05/04/19 1653 05/05/19 0322  WBC 9.8 20.9*  NEUTROABS 7.9*  --   HGB 11.1* 9.5*  HCT 36.9 31.2*  MCV 101.1* 100.3*  PLT 209 161    Basic Metabolic Panel: Recent Labs  Lab 05/04/19 1653 05/05/19 0322  NA 141 140  K 4.1 4.1  CL 112* 113*  CO2 21* 23  GLUCOSE 140* 120*  BUN 31* 32*  CREATININE 1.37* 1.48*  CALCIUM 8.9 8.6*    GFR: Estimated Creatinine Clearance: 31.6 mL/min (A) (by C-G formula based on SCr of 1.48 mg/dL (H)).  Liver Function Tests: Recent Labs  Lab 05/04/19 1653 05/05/19 0322  AST 22 131*  ALT 22 112*  ALKPHOS 75 63  BILITOT 0.4 0.3  PROT 6.2* 5.6*  ALBUMIN 3.5 3.0*   Recent Labs  Lab 05/04/19 1653  LIPASE 33   No results for input(s): AMMONIA in the last 168 hours.  Coagulation  Profile: No results for input(s): INR, PROTIME in the last 168 hours.  Cardiac Enzymes: No results for input(s): CKTOTAL, CKMB, CKMBINDEX, TROPONINI in the last 168 hours.  BNP (last 3 results) No results for input(s): PROBNP in the last 8760 hours.  HbA1C: No results for input(s): HGBA1C in the last 72 hours.  CBG: No results for input(s): GLUCAP in the last 168 hours.  Lipid Profile: No results for input(s): CHOL, HDL, LDLCALC, TRIG, CHOLHDL, LDLDIRECT in the last 72 hours.  Thyroid Function Tests: No results for input(s): TSH, T4TOTAL, FREET4, T3FREE, THYROIDAB in the last 72 hours.  Anemia Panel: No results for input(s): VITAMINB12, FOLATE, FERRITIN, TIBC, IRON, RETICCTPCT in the last 72 hours.  Urine analysis:    Component Value Date/Time   COLORURINE AMBER (A) 05/04/2019 1643   APPEARANCEUR HAZY (A) 05/04/2019 1643   APPEARANCEUR Cloudy (A) 01/25/2019 1425   LABSPEC 1.017 05/04/2019 1643   PHURINE 5.0 05/04/2019 1643   GLUCOSEU NEGATIVE 05/04/2019 1643  HGBUR NEGATIVE 05/04/2019 [place] 05/04/2019 1643   BILIRUBINUR Negative 01/25/2019 1425   KETONESUR 5 (A) 05/04/2019 1643   PROTEINUR 100 (A) 05/04/2019 1643   UROBILINOGEN negative 09/06/2015 1155   UROBILINOGEN 0.2 03/25/2014 1359   NITRITE NEGATIVE 05/04/2019 1643   LEUKOCYTESUR NEGATIVE 05/04/2019 1643    Sepsis Labs: Lactic Acid, Venous    Component Value Date/Time   LATICACIDVEN 1.8 05/04/2019 1931    MICROBIOLOGY: Recent Results (from the past 240 hour(s))  C difficile quick scan w PCR reflex     Status: None   Collection Time: 05/04/19  4:43 PM   Specimen: STOOL  Result Value Ref Range Status   C Diff antigen NEGATIVE NEGATIVE Final   C Diff toxin NEGATIVE NEGATIVE Final   C Diff interpretation No C. difficile detected.  Final    Comment: Performed at [facility], [address], [place], [place] [address]  SARS Coronavirus 2 [facility] order, Performed in [facility]  hospital lab) Nasopharyngeal Nasopharyngeal Swab     Status: None   Collection Time: 05/04/19  6:11 PM   Specimen: Nasopharyngeal Swab  Result Value Ref Range Status   SARS Coronavirus 2 NEGATIVE NEGATIVE Final    Comment: (NOTE) If result is NEGATIVE SARS-CoV-2 target nucleic acids are NOT DETECTED. The SARS-CoV-2 RNA is generally detectable in upper and lower  respiratory specimens during the acute phase of infection. The lowest  concentration of SARS-CoV-2 viral copies this assay can detect is 250  copies / mL. A negative result does not preclude SARS-CoV-2 infection  and should not be used as the sole basis for treatment or other  patient management decisions.  A negative result may occur with  improper specimen collection / handling, submission of specimen other  than nasopharyngeal swab, presence of viral mutation(s) within the  areas targeted by this assay, and inadequate number of viral copies  (<250 copies / mL). A negative result must be combined with clinical  observations, patient history, and epidemiological information. If result is POSITIVE SARS-CoV-2 target nucleic acids are DETECTED. The SARS-CoV-2 RNA is generally detectable in upper and lower  respiratory specimens dur ing the acute phase of infection.  Positive  results are indicative of active infection with SARS-CoV-2.  Clinical  correlation with patient history and other diagnostic information is  necessary to determine patient infection status.  Positive results do  not rule out bacterial infection or co-infection with other viruses. If result is PRESUMPTIVE POSTIVE SARS-CoV-2 nucleic acids MAY BE PRESENT.   A presumptive positive result was obtained on the submitted specimen  and confirmed on repeat testing.  While 2019 novel coronavirus  (SARS-CoV-2) nucleic acids may be present in the submitted sample  additional confirmatory testing may be necessary for epidemiological  and / or clinical management  purposes  to differentiate between  SARS-CoV-2 and other Sarbecovirus currently known to infect humans.  If clinically indicated additional testing with an alternate test  methodology [id number]) is advised. The SARS-CoV-2 RNA is generally  detectable in upper and lower respiratory sp ecimens during the acute  phase of infection. The expected result is Negative. Fact Sheet for Patients:  StrictlyIdeas.no Fact Sheet for Healthcare Providers: BankingDealers.co.za This test is not yet approved or cleared by the [place] FDA and has been authorized for detection and/or diagnosis of SARS-CoV-2 by FDA under an Emergency Use Authorization (EUA).  This EUA will remain in effect (meaning this test can be used) for the duration of the COVID-19  declaration under Section 564(b)(1) of the Act, 21 U.S.C. section 360bbb-3(b)(1), unless the authorization is terminated or revoked sooner. Performed at [facility], [address], [place], [place] [address]     RADIOLOGY STUDIES/RESULTS: Ct Abdomen Pelvis Wo Contrast  Result Date: 05/04/2019 CLINICAL DATA:  Abdominal pain.  Diarrhea. EXAM: CT ABDOMEN AND PELVIS WITHOUT CONTRAST TECHNIQUE: Multidetector CT imaging of the abdomen and pelvis was performed following the standard protocol without IV contrast. COMPARISON:  CT 11/14/2018 FINDINGS: Lower chest: Lung bases are clear.  Large hiatal hernia. Hepatobiliary: Gallbladder is absent. Normal appearance of the liver. Extrahepatic bile duct appears to be prominent and similar to the previous examination. Pancreas: Unremarkable. No pancreatic ductal dilatation or surrounding inflammatory changes. Spleen: Again noted is a low-density lesion involving the superior aspect of the spleen. This lesion roughly measures 3.0 x 2.6 x 2.3 cm. Lesion measured 3.0 x 2.5 x 1.4 cm in April 2020. Lesion was probably present in 2017 but clearly much smaller. Adrenals/Urinary Tract:  Normal adrenal glands. Low-density renal structures probably represent small cysts. No hydronephrosis. Urinary bladder is unremarkable. Negative for kidney stones. Stomach/Bowel: Large hiatal hernia. Normal appearance of the duodenum. Mild inflammatory changes around the sigmoid colon and there are few colonic diverticula involving the sigmoid colon. Mild pericolonic edema in left lower quadrant involving the proximal sigmoid colon and distal descending colon. Mild distention of the cecum is similar to the previous examination. No small bowel dilatation. Vascular/Lymphatic: Atherosclerotic calcifications in the aorta and iliac arteries without aneurysm. No significant lymph node enlargement in the abdomen or pelvis. Reproductive: Again noted is a low-density structure involving the left adnexa measuring roughly 4.2 cm and stable from the exam in April 2020 and minimally enlarged since 2017. Uterus has been removed. Other: Small amount of fluid in the pelvis.  Negative for free air. Musculoskeletal: Chronic compression deformity involving superior endplate of L4. Facet arthropathy in the lumbar spine. IMPRESSION: 1. Trace free fluid in the pelvis and concern for mild inflammatory changes around the sigmoid colon. There are colonic diverticula in this area. Unfortunately, there is significant motion artifact in this area that limits evaluation of the pelvis. Cannot exclude diverticulitis involving the sigmoid colon. No evidence for an abscess collection. 2. Low-density splenic lesion is nonspecific but could be cystic. Lesion may have slightly enlarged since April 2020 but clearly enlarged since 2017. Based on the evidence for enlargement since 2017, consider further characterization of the splenic lesion with MRI or follow-up CT in 3-6 months to ensure stability. 3. Minimal change in the low-density left adnexal lesion. This probably represents a cystic structure and favor a benign etiology based on the minimal  change since 2017. 4. Large hiatal hernia. 5. Probable bilateral renal cysts. Electronically Signed   By: Markus Daft M.D.   On: 05/04/2019 19:19   Dg Chest Port 1 View  Result Date: 05/04/2019 CLINICAL DATA:  Nausea vomiting abdominal pain EXAM: PORTABLE CHEST 1 VIEW COMPARISON:  April 17, 2016 FINDINGS: There is mild cardiomegaly. Aortic knob calcifications. There is prominence to the pulmonary vasculature. Air fluid-filled hiatal hernia is present. Degenerative changes in bilateral shoulders. IMPRESSION: Mild cardiomegaly.  Pulmonary vascular congestion. Large hiatal hernia. Electronically Signed   By: Prudencio Pair M.D.   On: 05/04/2019 17:29     LOS: 0 days   Oren Binet, MD  [facility]  If 7PM-7AM, please contact night-coverage  Please page via [email or web address]  Go to [email or web address] and use [place]'s universal password to access. If  you do not have the password, please contact the hospital operator.  Locate the [facility] provider you are looking for under [facility] and page to a number that you can be directly reached. If you still have difficulty reaching the provider, please page the [facility] (Director on Call) for the Hospitalists listed on amion for assistance.  05/05/2019, 10:03 AM

## 2019-05-06 DIAGNOSIS — R197 Diarrhea, unspecified: Secondary | ICD-10-CM

## 2019-05-06 DIAGNOSIS — R112 Nausea with vomiting, unspecified: Secondary | ICD-10-CM

## 2019-05-06 DIAGNOSIS — K219 Gastro-esophageal reflux disease without esophagitis: Secondary | ICD-10-CM

## 2019-05-06 DIAGNOSIS — R1030 Lower abdominal pain, unspecified: Secondary | ICD-10-CM

## 2019-05-06 DIAGNOSIS — K5792 Diverticulitis of intestine, part unspecified, without perforation or abscess without bleeding: Secondary | ICD-10-CM

## 2019-05-06 LAB — COMPREHENSIVE METABOLIC PANEL
ALT: 112 U/L — ABNORMAL HIGH (ref 0–44)
AST: 85 U/L — ABNORMAL HIGH (ref 15–41)
Albumin: 3.3 g/dL — ABNORMAL LOW (ref 3.5–5.0)
Alkaline Phosphatase: 72 U/L (ref 38–126)
Anion gap: 8 (ref 5–15)
BUN: 25 mg/dL — ABNORMAL HIGH (ref 8–23)
CO2: 23 mmol/L (ref 22–32)
Calcium: 8.7 mg/dL — ABNORMAL LOW (ref 8.9–10.3)
Chloride: 111 mmol/L (ref 98–111)
Creatinine, Ser: 1.47 mg/dL — ABNORMAL HIGH (ref 0.44–1.00)
GFR calc Af Amer: 39 mL/min — ABNORMAL LOW (ref >60)
GFR calc non Af Amer: 34 mL/min — ABNORMAL LOW (ref >60)
Glucose, Bld: 107 mg/dL — ABNORMAL HIGH (ref 70–99)
Potassium: 4 mmol/L (ref 3.5–5.1)
Sodium: 142 mmol/L (ref 135–145)
Total Bilirubin: 0.7 mg/dL (ref 0.3–1.2)
Total Protein: 6 g/dL — ABNORMAL LOW (ref 6.5–8.1)

## 2019-05-06 LAB — CBC WITH DIFFERENTIAL/PLATELET
Abs Immature Granulocytes: 0.07 10*3/uL (ref 0.00–0.07)
Basophils Absolute: 0.1 10*3/uL (ref 0.0–0.1)
Basophils Relative: 1 %
Eosinophils Absolute: 0.2 10*3/uL (ref 0.0–0.5)
Eosinophils Relative: 2 %
HCT: 31.6 % — ABNORMAL LOW (ref 36.0–46.0)
Hemoglobin: 9.5 g/dL — ABNORMAL LOW (ref 12.0–15.0)
Immature Granulocytes: 1 %
Lymphocytes Relative: 12 %
Lymphs Abs: 1.5 10*3/uL (ref 0.7–4.0)
MCH: 30.6 pg (ref 26.0–34.0)
MCHC: 30.1 g/dL (ref 30.0–36.0)
MCV: 101.9 fL — ABNORMAL HIGH (ref 80.0–100.0)
Monocytes Absolute: 0.9 10*3/uL (ref 0.1–1.0)
Monocytes Relative: 7 %
Neutro Abs: 9.3 10*3/uL — ABNORMAL HIGH (ref 1.7–7.7)
Neutrophils Relative %: 77 %
Platelets: 179 10*3/uL (ref 150–400)
RBC: 3.1 MIL/uL — ABNORMAL LOW (ref 3.87–5.11)
RDW: 14.3 % (ref 11.5–15.5)
WBC: 12.1 10*3/uL — ABNORMAL HIGH (ref 4.0–10.5)
nRBC: 0 % (ref 0.0–0.2)

## 2019-05-06 MED ORDER — DEXTROSE-NACL 5-0.45 % IV SOLN
INTRAVENOUS | Status: AC
  Administered 2019-05-06: 13:00:00 via INTRAVENOUS

## 2019-05-06 MED ORDER — DEXTROSE-NACL 5-0.45 % IV SOLN
INTRAVENOUS | Status: DC
  Administered 2019-05-06: 09:00:00 via INTRAVENOUS

## 2019-05-06 MED ORDER — HALOPERIDOL LACTATE 5 MG/ML IJ SOLN
2.0000 mg | Freq: Once | INTRAMUSCULAR | Status: AC
  Administered 2019-05-07: 01:00:00 2 mg via INTRAVENOUS
  Filled 2019-05-06: qty 1

## 2019-05-06 NOTE — Plan of Care (Signed)
  Problem: Education: Goal: Knowledge of General Education information will improve Description Including pain rating scale, medication(s)/side effects and non-pharmacologic comfort measures Outcome: Progressing   

## 2019-05-06 NOTE — Progress Notes (Signed)
PROGRESS NOTE        PATIENT DETAILS Name: Tamara Stein Age: 78 y.o. Sex: female Date of Birth: 06/05/1941 Admit Date: 05/04/2019 Admitting Physician Evalee Mutton Kristeen Mans, MD ZTI:[id number], Koleen Distance, DO  Brief Narrative: Patient is a 78 y.o. 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  CT abdomen suggestive of possible diverticulitis.  See below for further details.   -05/06/2019 overnight earlier this morning patient had some vomiting with clear liquid-we will resume n.p.o.  --------------------------------------------------------------------------------------------------------------------------- Subjective:  The patient was seen and examined this morning, remained to be stable, had some nausea vomiting yesterday.  Patient was resumed back to n.p.o. Reporting improved diarrhea. Continues to complain of left lower quadrant abdominal pain.    Diarrhea seems to be better.    Assessment/Plan: Probable diverticulitis:  -Continues to complain of left and right side lower quadrant abdominal pai- -associated nausea vomiting -Patient will be kept n.p.o. -Gentle IV fluid hydration initiated -Noted for elevated WBCs, but afebrile, normotensive no signs of sepsis -Resuming Zosyn IV -GI appreciate input  - CT abdomen somewhat suggestive of diverticulitis.  Patient is s/p appendectomy and cholecystectomy in the remote past.    -Significant worsening of leukocytosis-however abdominal exam is not suggestive of peritonitis. -Reassessing later today with possibility of restarting clear liquid diet   We will touch base with GI-as not sure if diverticutlitis fits the clinical picture?  Diarrhea:  -Improved, -Seems to have improved-C. difficile PCR negative -await GI  pathogen panel.  Suspect related to above.  AKI on CKD stage III: Likely hemodynamically mediated-supportive care-hold lisinopril.  Transaminitis: Supportive care for now-if worsens-we can initiate further work-up  HTN: Controlled-continue Norvasc, and Lasix.  Hold lisinopril due to worsening AKI  Dyslipidemia: Hold Lipitor due to worsening elevated liver enzymes  History of CVA/cerebellar degeneration: No obvious new focal neurological deficits-is very frail/deconditioned at baseline-and mostly bed to wheelchair bound.  Continue aspirin/statin  Hypothyroidism: Continue levothyroxine  GERD continue PPI  Anxiety/depression continue Cymbalta  Obesity: Estimated body mass index is 35.04 kg/m as calculated from the following:   Height as of this encounter: 5' 2"  (1.575 m).   Weight as of this encounter: 86.9 kg.   Diet: Diet Order            Diet NPO time specified  Diet effective now               DVT Prophylaxis: Prophylactic Lovenox  Code Status:  DNR Family Communication: Sister over the phone  Disposition Plan: Remain inpatient  Barriers to discharge: Diverticulitis with worsening leukocytosis-remains on clear liquids and intravenous antibiotics.  Antimicrobial agents: Anti-infectives (From admission, onward)   Start     Dose/Rate Route Frequency Ordered Stop   05/06/19 2000  cefTRIAXone (ROCEPHIN) 2 g in sodium chloride 0.9 % 100 mL IVPB  Status:  Discontinued     2 g 200 mL/hr over 30 Minutes Intravenous Every 24 hours 05/05/19 0003 05/05/19 0008   05/06/19 0600  metroNIDAZOLE (FLAGYL) IVPB 500 mg  Status:  Discontinued     500 mg 100 mL/hr over 60 Minutes Intravenous Every 8 hours 05/05/19 0002 05/05/19 0904   05/05/19 2000  cefTRIAXone (ROCEPHIN) 2 g in sodium chloride 0.9 % 100 mL IVPB  Status:  Discontinued     2 g 200 mL/hr over 30 Minutes Intravenous Every 24 hours 05/05/19 0008 05/05/19 0904   05/05/19 1600  piperacillin-tazobactam (ZOSYN) IVPB  3.375 g     3.375 g 12.5 mL/hr over 240 Minutes Intravenous Every 8 hours 05/05/19 1042     05/05/19 0945  piperacillin-tazobactam (ZOSYN) IVPB 3.375 g     3.375 g 100 mL/hr over 30 Minutes Intravenous  Once 05/05/19 0937 05/05/19 1042   05/04/19 2045  cefTRIAXone (ROCEPHIN) 2 g in sodium chloride 0.9 % 100 mL IVPB     2 g 200 mL/hr over 30 Minutes Intravenous  Once 05/04/19 2037 05/04/19 2140   05/04/19 2045  metroNIDAZOLE (FLAGYL) IVPB 500 mg     500 mg 100 mL/hr over 60 Minutes Intravenous  Once 05/04/19 2037 05/04/19 2354      Procedures: None  CONSULTS:  GI  Time spent: 25- minutes-Greater than 50% of this time was spent in counseling, explanation of diagnosis, planning of further management, and coordination of care.  MEDICATIONS: Scheduled Meds:  acidophilus  1 capsule Oral Daily   amLODipine  10 mg Oral q morning - 10a   aspirin EC  81 mg Oral q morning - 10a   DULoxetine  60 mg Oral q morning - 10a   enoxaparin (LOVENOX) injection  40 mg Subcutaneous Q24H   influenza vaccine adjuvanted  0.5 mL Intramuscular Tomorrow-1000   levothyroxine  50 mcg Oral QAC breakfast   loratadine  10 mg Oral Daily   pantoprazole  40 mg Oral Daily   Continuous Infusions:  dextrose 5 % and 0.45% NaCl 75 mL/hr at 05/06/19 0857   piperacillin-tazobactam (ZOSYN)  IV 3.375 g (05/06/19 0618)   PRN Meds:.acetaminophen **OR** acetaminophen, GRX Analgesic Balm, meclizine, morphine injection, ondansetron   PHYSICAL EXAM: Vital signs: Vitals:   05/05/19 1330 05/05/19 1434 05/05/19 2129 05/06/19 0510  BP:  (!) 117/99 128/68 132/66  Pulse: 99 92 80 96  Resp: (!) 35 20 20 20   Temp:  98.3 F (36.8 C) 98.2 F (36.8 C) 98.3 F (36.8 C)  TempSrc:  Oral Oral Oral  SpO2: 97% 99% 96% 98%  Weight:  86.5 kg  86.9 kg  Height:  5' 2"  (1.575 m)     Filed Weights   05/04/19 1643 05/05/19 1434 05/06/19 0510  Weight: 87.1 kg 86.5 kg 86.9 kg   Body mass index is 35.04 kg/m.  BP  132/66 (BP Location: Left Wrist)    Pulse 96    Temp 98.3 F (36.8 C) (Oral)    Resp 20    Ht 5' 2"  (1.575 m)    Wt 86.9 kg    SpO2 98%    BMI 35.04 kg/m    Physical Exam  Constitution:  Alert, cooperative, no distress,  Psychiatric: Normal and stable mood and affect, cognition intact,   HEENT: Normocephalic, PERRL, otherwise with in Normal limits  Chest:Chest symmetric Cardio vascular:  S1/S2, RRR, No murmure, No Rubs or Gallops  pulmonary: Clear to auscultation bilaterally, respirations unlabored, negative wheezes / crackles Abdomen: Soft, LLQ tenderness, non-distended, bowel sounds,no masses, no organomegaly Muscular skeletal: Limited exam - in bed, able to move all 4 extremities, Normal strength,  Neuro: CNII-XII intact. , normal motor and sensation, reflexes intact  Extremities: No pitting edema lower extremities, +2 pulses  Skin: Dry, warm to touch, negative for any Rashes, No open wounds Wounds: per nursing documentation    I have personally reviewed following labs and imaging  studies  LABORATORY DATA: CBC: Recent Labs  Lab 05/04/19 1653 05/05/19 0322 05/06/19 0717  WBC 9.8 20.9* 12.1*  NEUTROABS 7.9*  --  9.3*  HGB 11.1* 9.5* 9.5*  HCT 36.9 31.2* 31.6*  MCV 101.1* 100.3* 101.9*  PLT 209 190 502    Basic Metabolic Panel: Recent Labs  Lab 05/04/19 1653 05/05/19 0322 05/06/19 0717  NA 141 140 142  K 4.1 4.1 4.0  CL 112* 113* 111  CO2 21* 23 23  GLUCOSE 140* 120* 107*  BUN 31* 32* 25*  CREATININE 1.37* 1.48* 1.47*  CALCIUM 8.9 8.6* 8.7*    GFR: Estimated Creatinine Clearance: 31.7 mL/min (A) (by C-G formula based on SCr of 1.47 mg/dL (H)).  Liver Function Tests: Recent Labs  Lab 05/04/19 1653 05/05/19 0322 05/06/19 0717  AST 22 131* 85*  ALT 22 112* 112*  ALKPHOS 75 63 72  BILITOT 0.4 0.3 0.7  PROT 6.2* 5.6* 6.0*  ALBUMIN 3.5 3.0* 3.3*   Recent Labs  Lab 05/04/19 1653  LIPASE 33   Urine analysis:    Component Value Date/Time    COLORURINE AMBER (A) 05/04/2019 1643   APPEARANCEUR HAZY (A) 05/04/2019 1643   APPEARANCEUR Cloudy (A) 01/25/2019 1425   LABSPEC 1.017 05/04/2019 1643   PHURINE 5.0 05/04/2019 1643   GLUCOSEU NEGATIVE 05/04/2019 1643   HGBUR NEGATIVE 05/04/2019 1643   BILIRUBINUR NEGATIVE 05/04/2019 1643   BILIRUBINUR Negative 01/25/2019 1425   KETONESUR 5 (A) 05/04/2019 1643   PROTEINUR 100 (A) 05/04/2019 1643   UROBILINOGEN negative 09/06/2015 1155   UROBILINOGEN 0.2 03/25/2014 1359   NITRITE NEGATIVE 05/04/2019 1643   LEUKOCYTESUR NEGATIVE 05/04/2019 1643    Sepsis Labs: Lactic Acid, Venous    Component Value Date/Time   LATICACIDVEN 1.8 05/04/2019 1931    MICROBIOLOGY: Recent Results (from the past 240 hour(s))  Urine culture     Status: None   Collection Time: 05/04/19  4:43 PM   Specimen: Urine, Random  Result Value Ref Range Status   Specimen Description   Final    URINE, RANDOM Performed at [facility], [address] [place], [place] [address]    Special Requests   Final    NONE Performed at [facility], [address], [place], [place] [address]    Culture   Final    NO GROWTH Performed at [facility], [place] [address], [place], [place] [address]    Report Status 05/05/2019 FINAL  Final  C difficile quick scan w PCR reflex     Status: None   Collection Time: 05/04/19  4:43 PM   Specimen: STOOL  Result Value Ref Range Status   C Diff antigen NEGATIVE NEGATIVE Final   C Diff toxin NEGATIVE NEGATIVE Final   C Diff interpretation No C. difficile detected.  Final    Comment: Performed at [facility], [address] [place], [place] [address]  SARS Coronavirus 2 [facility] order, Performed in [facility] hospital lab) Nasopharyngeal Nasopharyngeal Swab     Status: None   Collection Time: 05/04/19  6:11 PM   Specimen: Nasopharyngeal Swab  Result Value Ref Range Status   SARS Coronavirus 2 NEGATIVE NEGATIVE Final   RADIOLOGY STUDIES/RESULTS: Ct Abdomen Pelvis Wo  Contrast  Result Date: 05/04/2019 CLINICAL DATA:  Abdominal pain.  Diarrhea. EXAM: CT ABDOMEN AND PELVIS WITHOUT CONTRAST TECHNIQUE: Multidetector CT imaging of the abdomen and pelvis was performed following the standard protocol without IV contrast. COMPARISON:  CT 11/14/2018 FINDINGS: Lower chest: Lung bases  are clear.  Large hiatal hernia. Hepatobiliary: Gallbladder is absent. Normal appearance of the liver. Extrahepatic bile duct appears to be prominent and similar to the previous examination. Pancreas: Unremarkable. No pancreatic ductal dilatation or surrounding inflammatory changes. Spleen: Again noted is a low-density lesion involving the superior aspect of the spleen. This lesion roughly measures 3.0 x 2.6 x 2.3 cm. Lesion measured 3.0 x 2.5 x 1.4 cm in April 2020. Lesion was probably present in 2017 but clearly much smaller. Adrenals/Urinary Tract: Normal adrenal glands. Low-density renal structures probably represent small cysts. No hydronephrosis. Urinary bladder is unremarkable. Negative for kidney stones. Stomach/Bowel: Large hiatal hernia. Normal appearance of the duodenum. Mild inflammatory changes around the sigmoid colon and there are few colonic diverticula involving the sigmoid colon. Mild pericolonic edema in left lower quadrant involving the proximal sigmoid colon and distal descending colon. Mild distention of the cecum is similar to the previous examination. No small bowel dilatation. Vascular/Lymphatic: Atherosclerotic calcifications in the aorta and iliac arteries without aneurysm. No significant lymph node enlargement in the abdomen or pelvis. Reproductive: Again noted is a low-density structure involving the left adnexa measuring roughly 4.2 cm and stable from the exam in April 2020 and minimally enlarged since 2017. Uterus has been removed. Other: Small amount of fluid in the pelvis.  Negative for free air. Musculoskeletal: Chronic compression deformity involving superior endplate of  L4. Facet arthropathy in the lumbar spine. IMPRESSION: 1. Trace free fluid in the pelvis and concern for mild inflammatory changes around the sigmoid colon. There are colonic diverticula in this area. Unfortunately, there is significant motion artifact in this area that limits evaluation of the pelvis. Cannot exclude diverticulitis involving the sigmoid colon. No evidence for an abscess collection. 2. Low-density splenic lesion is nonspecific but could be cystic. Lesion may have slightly enlarged since April 2020 but clearly enlarged since 2017. Based on the evidence for enlargement since 2017, consider further characterization of the splenic lesion with MRI or follow-up CT in 3-6 months to ensure stability. 3. Minimal change in the low-density left adnexal lesion. This probably represents a cystic structure and favor a benign etiology based on the minimal change since 2017. 4. Large hiatal hernia. 5. Probable bilateral renal cysts. Electronically Signed   By: Markus Daft M.D.   On: 05/04/2019 19:19   Dg Chest Port 1 View  Result Date: 05/04/2019 CLINICAL DATA:  Nausea vomiting abdominal pain EXAM: PORTABLE CHEST 1 VIEW COMPARISON:  April 17, 2016 FINDINGS: There is mild cardiomegaly. Aortic knob calcifications. There is prominence to the pulmonary vasculature. Air fluid-filled hiatal hernia is present. Degenerative changes in bilateral shoulders. IMPRESSION: Mild cardiomegaly.  Pulmonary vascular congestion. Large hiatal hernia. Electronically Signed   By: Prudencio Pair M.D.   On: 05/04/2019 17:29     LOS: 1 day   Deatra James, MD  [facility]  If 7PM-7AM, please contact night-coverage  Please page via [email or web address]  Go to [email or web address] and use [place]'s universal password to access. If you do not have the password, please contact the hospital operator.  Locate the [facility] provider you are looking for under [facility] and page to a number that you can be directly reached. If you  still have difficulty reaching the provider, please page the [facility] (Director on Call) for the Hospitalists listed on amion for assistance.  05/06/2019, 12:18 PM

## 2019-05-06 NOTE — Care Management Important Message (Signed)
Important Message  Patient Details  Name: Tamara Stein MRN: [id number] Date of Birth: August 21, 1940   Medicare Important Message Given:  Yes     [name] 05/06/2019, 1:51 PM

## 2019-05-06 NOTE — Progress Notes (Addendum)
Subjective: Significantly better today. States abdominal pain almost gone. No N/V since Tuesday. Denies a bowel movement recently. No other GI complaints  Objective: Vital signs in last 24 hours: Temp:  [98.2 F (36.8 C)-98.3 F (36.8 C)] 98.3 F (36.8 C) (09/25 0510) Pulse Rate:  [80-99] 96 (09/25 0510) Resp:  [13-35] 20 (09/25 0510) BP: (117-150)/(66-99) 132/66 (09/25 0510) SpO2:  [95 %-100 %] 98 % (09/25 0510) Weight:  [86.5 kg-86.9 kg] 86.9 kg (09/25 0510) Last BM Date: 05/05/19 General:   Alert and oriented, pleasant.  Head:  Normocephalic and atraumatic. Eyes:  No icterus, sclera clear. Conjuctiva pink.   Heart:  S1, S2 present, no murmurs noted.  Lungs: Clear to auscultation bilaterally, without wheezing, rales, or rhonchi.  Abdomen:  Bowel sounds present, soft, non-distended. Some moderate TTP RLQ; LLQ non-tender. No HSM or hernias noted. No rebound or guarding. No masses appreciated  Msk:  Symmetrical without gross deformities. Pulses:  Normal bilateral DP pulses noted. Extremities:  Without clubbing or edema. Neurologic:  Alert and  oriented x4;  Speech difficult to understand presumably due to cerebellar degeneration. Skin:  Warm and dry, intact without significant lesions.  Psych:  Alert and cooperative. Normal mood and affect.  Intake/Output from previous day: 09/24 0701 - 09/25 0700 In: 50.8 [IV Piggyback:50.8] Out: 1100 [Urine:1100] Intake/Output this shift: No intake/output data recorded.  Lab Results: Recent Labs    05/04/19 1653 05/05/19 0322  WBC 9.8 20.9*  HGB 11.1* 9.5*  HCT 36.9 31.2*  PLT 209 190   BMET Recent Labs    05/04/19 1653 05/05/19 0322 05/06/19 0717  NA 141 140 142  K 4.1 4.1 4.0  CL 112* 113* 111  CO2 21* 23 23  GLUCOSE 140* 120* 107*  BUN 31* 32* 25*  CREATININE 1.37* 1.48* 1.47*  CALCIUM 8.9 8.6* 8.7*   LFT Recent Labs    05/04/19 1653 05/05/19 0322 05/06/19 0717  PROT 6.2* 5.6* 6.0*  ALBUMIN 3.5 3.0* 3.3*   AST 22 131* 85*  ALT 22 112* 112*  ALKPHOS 75 63 72  BILITOT 0.4 0.3 0.7   PT/INR No results for input(s): LABPROT, INR in the last 72 hours. Hepatitis Panel No results for input(s): HEPBSAG, HCVAB, HEPAIGM, HEPBIGM in the last 72 hours.   Studies/Results: Ct Abdomen Pelvis Wo Contrast  Result Date: 05/04/2019 CLINICAL DATA:  Abdominal pain.  Diarrhea. EXAM: CT ABDOMEN AND PELVIS WITHOUT CONTRAST TECHNIQUE: Multidetector CT imaging of the abdomen and pelvis was performed following the standard protocol without IV contrast. COMPARISON:  CT 11/14/2018 FINDINGS: Lower chest: Lung bases are clear.  Large hiatal hernia. Hepatobiliary: Gallbladder is absent. Normal appearance of the liver. Extrahepatic bile duct appears to be prominent and similar to the previous examination. Pancreas: Unremarkable. No pancreatic ductal dilatation or surrounding inflammatory changes. Spleen: Again noted is a low-density lesion involving the superior aspect of the spleen. This lesion roughly measures 3.0 x 2.6 x 2.3 cm. Lesion measured 3.0 x 2.5 x 1.4 cm in April 2020. Lesion was probably present in 2017 but clearly much smaller. Adrenals/Urinary Tract: Normal adrenal glands. Low-density renal structures probably represent small cysts. No hydronephrosis. Urinary bladder is unremarkable. Negative for kidney stones. Stomach/Bowel: Large hiatal hernia. Normal appearance of the duodenum. Mild inflammatory changes around the sigmoid colon and there are few colonic diverticula involving the sigmoid colon. Mild pericolonic edema in left lower quadrant involving the proximal sigmoid colon and distal descending colon. Mild distention of the cecum is similar to the  previous examination. No small bowel dilatation. Vascular/Lymphatic: Atherosclerotic calcifications in the aorta and iliac arteries without aneurysm. No significant lymph node enlargement in the abdomen or pelvis. Reproductive: Again noted is a low-density structure  involving the left adnexa measuring roughly 4.2 cm and stable from the exam in April 2020 and minimally enlarged since 2017. Uterus has been removed. Other: Small amount of fluid in the pelvis.  Negative for free air. Musculoskeletal: Chronic compression deformity involving superior endplate of L4. Facet arthropathy in the lumbar spine. IMPRESSION: 1. Trace free fluid in the pelvis and concern for mild inflammatory changes around the sigmoid colon. There are colonic diverticula in this area. Unfortunately, there is significant motion artifact in this area that limits evaluation of the pelvis. Cannot exclude diverticulitis involving the sigmoid colon. No evidence for an abscess collection. 2. Low-density splenic lesion is nonspecific but could be cystic. Lesion may have slightly enlarged since April 2020 but clearly enlarged since 2017. Based on the evidence for enlargement since 2017, consider further characterization of the splenic lesion with MRI or follow-up CT in 3-6 months to ensure stability. 3. Minimal change in the low-density left adnexal lesion. This probably represents a cystic structure and favor a benign etiology based on the minimal change since 2017. 4. Large hiatal hernia. 5. Probable bilateral renal cysts. Electronically Signed   By: Markus Daft M.D.   On: 05/04/2019 19:19   Dg Chest Port 1 View  Result Date: 05/04/2019 CLINICAL DATA:  Nausea vomiting abdominal pain EXAM: PORTABLE CHEST 1 VIEW COMPARISON:  April 17, 2016 FINDINGS: There is mild cardiomegaly. Aortic knob calcifications. There is prominence to the pulmonary vasculature. Air fluid-filled hiatal hernia is present. Degenerative changes in bilateral shoulders. IMPRESSION: Mild cardiomegaly.  Pulmonary vascular congestion. Large hiatal hernia. Electronically Signed   By: Prudencio Pair M.D.   On: 05/04/2019 17:29    Assessment: 78 year old female with cerebellar degeneration presenting with acute on chronic diarrhea associated with  lower abdominal pain and vomiting.   Abdominal pain: Lower abdominal pain right greater than left.  Acute on chronic and typically associated with diarrhea.  However, the patient came in complaining of intermittent explosive diarrhea for months as well as vomiting yesterday.  C. difficile negative.  GI pathogen panel still pending.  CT suggestive of sigmoid diverticulitis, although limited due to motion artifact.  White blood cell count increased yesterday from 9.8 to 20.9.  Patient reported prior cholecystectomy. Yesterday her abdominal exam with mild lower abdominal tenderness but does not appear to be acute abdomen. Today she has had significant improvement in her pain with mild to moderate RLQ TTP, No other abdominal TTP.  Transaminitis: New since admission. Today LFTs stable/slightly improved (AST/ALT from 131/112 to 85/112)  Known splenic lesion, consider repeat imaging in 3-6 months to document stability, favor MRI if patient can tolerate exam and remain still for imaging otherwise, CT.  Overall today she is clinical improved. Pain almost resolved. Denies N/V. Feeling a lot better. Some mild to moderate RLQ pain on exam but otherwise pain free. No need for surgical intervention at this time.  Plan: 1. CBC today 2. Continued antibiotics - can transition to oral antibiotics 3. Advance to low residue diet 4. Supportive measures 5. Follow for GI Path panel results 6. Repeat spleen imaging 3-6 months MRI vs CT   Thank you for allowing [place] to participate in the care of Tamara Stein  Walden Field, [place], AGNP-C Adult & Gerontological Nurse Practitioner [facility]  LOS: 1 day    05/06/2019, 8:49 AM

## 2019-05-07 DIAGNOSIS — E785 Hyperlipidemia, unspecified: Secondary | ICD-10-CM

## 2019-05-07 LAB — HEPATIC FUNCTION PANEL
ALT: 100 U/L — ABNORMAL HIGH (ref 0–44)
AST: 84 U/L — ABNORMAL HIGH (ref 15–41)
Albumin: 3.1 g/dL — ABNORMAL LOW (ref 3.5–5.0)
Alkaline Phosphatase: 89 U/L (ref 38–126)
Bilirubin, Direct: 0.1 mg/dL (ref 0.0–0.2)
Indirect Bilirubin: 0.5 mg/dL (ref 0.3–0.9)
Total Bilirubin: 0.6 mg/dL (ref 0.3–1.2)
Total Protein: 5.7 g/dL — ABNORMAL LOW (ref 6.5–8.1)

## 2019-05-07 LAB — CBC
HCT: 30.6 % — ABNORMAL LOW (ref 36.0–46.0)
Hemoglobin: 9.3 g/dL — ABNORMAL LOW (ref 12.0–15.0)
MCH: 30.5 pg (ref 26.0–34.0)
MCHC: 30.4 g/dL (ref 30.0–36.0)
MCV: 100.3 fL — ABNORMAL HIGH (ref 80.0–100.0)
Platelets: 172 10*3/uL (ref 150–400)
RBC: 3.05 MIL/uL — ABNORMAL LOW (ref 3.87–5.11)
RDW: 14 % (ref 11.5–15.5)
WBC: 7.9 10*3/uL (ref 4.0–10.5)
nRBC: 0 % (ref 0.0–0.2)

## 2019-05-07 LAB — BASIC METABOLIC PANEL
Anion gap: 6 (ref 5–15)
BUN: 17 mg/dL (ref 8–23)
CO2: 24 mmol/L (ref 22–32)
Calcium: 8.8 mg/dL — ABNORMAL LOW (ref 8.9–10.3)
Chloride: 113 mmol/L — ABNORMAL HIGH (ref 98–111)
Creatinine, Ser: 1.39 mg/dL — ABNORMAL HIGH (ref 0.44–1.00)
GFR calc Af Amer: 42 mL/min — ABNORMAL LOW (ref >60)
GFR calc non Af Amer: 36 mL/min — ABNORMAL LOW (ref >60)
Glucose, Bld: 112 mg/dL — ABNORMAL HIGH (ref 70–99)
Potassium: 3.8 mmol/L (ref 3.5–5.1)
Sodium: 143 mmol/L (ref 135–145)

## 2019-05-07 MED ORDER — AMOXICILLIN-POT CLAVULANATE 875-125 MG PO TABS: 1.0000 | ORAL_TABLET | Freq: Two times a day (BID) | ORAL | Status: DC

## 2019-05-07 MED ORDER — AMOXICILLIN-POT CLAVULANATE 875-125 MG PO TABS: 1.0000 | ORAL_TABLET | Freq: Two times a day (BID) | ORAL | 0 refills | Status: DC

## 2019-05-07 MED ORDER — ATORVASTATIN CALCIUM 40 MG PO TABS: 40.0000 mg | ORAL_TABLET | Freq: Every day | ORAL | 0 refills | Status: DC

## 2019-05-07 NOTE — Progress Notes (Signed)
PT DISCHARGED TO HOME, IV REMOVED ANGIO INTACT, VS STABLE, DENIES C/O PAIN. PT LEFT FLOOR VIA STRETCHER WITH BELONGINGS ACCOMPANIED BY EMS STAFF. FAMILY NOTIFIED OF [name] DEPARTURE FROM FLOOR AS PER REQUEST.

## 2019-05-07 NOTE — Discharge Summary (Signed)
Physician Discharge Summary  Tamara Stein EZM:[id number] DOB: [birth year] DOA: 05/04/2019  PCP: Janora Norlander, DO  Admit date: 05/04/2019 Discharge date: 05/07/2019  Admitted From: home Disposition:  Home   Recommendations for Outpatient Follow-up:  1. Follow up with PCP in 1-2 weeks 2. Please obtain LFTs in one week   Home Health: [facility] Discharge Condition: Stable CODE STATUS: DNR Diet recommendation: soft   Brief/Interim Summary: 78 year old female with a history of spinal cerebellar degeneration, hypertension, CKD stage III, anxiety, hypothyroidism, hyperlipidemia presenting with lower abdominal pain, nausea vomiting and loose stools.  EMS stated that the patient had blood pressure of 80/60.  Apparently, the patient has had a chronic history of intermittent diarrhea progressive over the last few months.  In addition, the patient has also had intermittent lower abdominal pain.  There is no history of rectal bleeding or hematochezia or melena.  In the emergency department, CT of abdomen and pelvis was performed and showed mild inflammatory changes around the sigmoid colon and diverticula.  CT also showed nonspecific low-density lesion in the spleen slightly enlarged when compared to April 2020.  The patient was started on IV antibiotics and IV fluids.  GI was consulted to assist with management.  The patient was initially placed on bowel rest.  She had clinical improvement with decreased WBC and improvement of her abdominal pain as well as nausea and vomiting.  Her diet was gradually advanced which she tolerated.  The patient was transitioned to oral antibiotics.  C. difficile was negative.  GI pathogen panel was pending at the time of discharge, but the patient did not have any documented diarrhea for 36 hours prior to discharge.  She can follow-up in the GI clinic or with her primary care provider.  Discharge Diagnoses:  Acute diverticulitis:  -Abdominal pain has  resolved -Patient is tolerating soft diet -No documented diarrhea or loose stools in the past 36 hours prior to discharge -Gentle IV fluid hydration initiated -WBC improved 20.9>>7.9 -Resuming Zosyn IV>>> discharge home with Augmentin x7 more days to finish a 10-day course -GI appreciate input  - CT abdomen somewhat suggestive of diverticulitis.  Patient is s/p appendectomy and cholecystectomy in the remote past.     Diarrhea:  -Improved, -Seems to have improved-C. difficile PCR negative -await GI pathogen panel--pending at the time of discharge, but the patient did not have any documented loose stools 24 to 36 hours prior to discharge.  CKD stage III: -Baseline creatinine 1.1-1.4 -Serum creatinine 1.39 at the time of discharge  Transaminitis:  -Abdominal pain is improved and patient is tolerating diet -LFTs continue trending down  HTN: -Continue amlodipine -Lisinopril was held during the hospitalization -BP was stable without lisinopril -Will not resume lisinopril at time of discharge due to CKD  Dyslipidemia:  -Hold Lipitor due to worsening elevated liver enzymes -Repeat LFTs 1 week after discharge -Certainly, Lipitor can be restarted once LFTs normalize  History of CVA/cerebellar degeneration: No obvious new focal neurological deficits-is very frail/deconditioned at baseline-and mostly bed to wheelchair bound.  Continue aspirin/  Hypothyroidism: Continue levothyroxine  GERD continue PPI  Anxiety/depression continue Cymbalta  Obesity: Estimated body mass index is 35.04 kg/m as calculated from the following:   Height as of this encounter: 5' 2"  (1.575 m).   Weight as of this encounter: 86.9 kg.   Discharge Instructions   Allergies as of 05/07/2019      Reactions   Sulfa Antibiotics Anaphylaxis   Facial swelling    Codeine Other (See Comments)  BLACK OUTS   Doxycycline Nausea Only   Erythromycin Swelling   Ibuprofen Other (See Comments)    Stomach pain, muscle spasm, GI bleeding   Levaquin [levofloxacin] Other (See Comments)   Insomnia/trouble breathing   Penicillins Diarrhea   Has patient had a PCN reaction causing immediate rash, facial/tongue/throat swelling, SOB or lightheadedness with hypotension: No Has patient had a PCN reaction causing severe rash involving mucus membranes or skin necrosis: No Has patient had a PCN reaction that required hospitalization Unknown Has patient had a PCN reaction occurring within the last 10 years: No If all of the above answers are "NO", then may proceed with Cephalosporin use.      Medication List    STOP taking these medications   ciprofloxacin 250 MG tablet Commonly known as: CIPRO   lisinopril 30 MG tablet Commonly known as: ZESTRIL     TAKE these medications   Align Prebiotic-Probiotic 5-1.25 MG-GM Chew Chew 1 tablet by mouth daily.   amLODipine 10 MG tablet Commonly known as: NORVASC TAKE 1 TABLET DAILY FOR    BLOOD PRESSURE What changed:   when to take this  additional instructions   amoxicillin-clavulanate 875-125 MG tablet Commonly known as: AUGMENTIN Take 1 tablet by mouth every 12 (twelve) hours.   aspirin EC 81 MG tablet Take 81 mg by mouth every morning.   atorvastatin 40 MG tablet Commonly known as: LIPITOR Take 1 tablet (40 mg total) by mouth at bedtime. Do not restart until instructed by MD Start taking on: May 14, 2019 What changed:   additional instructions  These instructions start on May 14, 2019. If you are unsure what to do until then, ask your doctor or other care provider.   CALTRATE 600+D PO Take 1 tablet by mouth daily.   cetirizine 10 MG chewable tablet Commonly known as: ZYRTEC Chew 10 mg by mouth daily.   diclofenac 50 MG EC tablet Commonly known as: VOLTAREN TAKE 1 TABLET BY MOUTH (50MG TOTAL) THREE TIMES DAILY What changed:   how much to take  how to take this  when to take this  additional instructions    DULoxetine 60 MG capsule Commonly known as: CYMBALTA Take 1 capsule (60 mg total) by mouth daily. What changed: when to take this   furosemide 20 MG tablet Commonly known as: LASIX Take 1 tablet (20 mg total) by mouth daily as needed. As needed for swelling   levothyroxine 50 MCG tablet Commonly known as: SYNTHROID TAKE 1 TABLET ONCE DAILY What changed:   how much to take  how to take this  when to take this  additional instructions   meclizine 25 MG tablet Commonly known as: ANTIVERT Take 25 mg by mouth 3 (three) times daily as needed for dizziness.   multivitamin with minerals Tabs tablet Take 1 tablet by mouth daily.   omeprazole 40 MG capsule Commonly known as: PRILOSEC TAKE 1 CAPSULE DAILY What changed: when to take this   ondansetron 4 MG disintegrating tablet Commonly known as: Zofran ODT Take 1 tablet (4 mg total) by mouth every 8 (eight) hours as needed.   raloxifene 60 MG tablet Commonly known as: EVISTA Take 1 tablet (60 mg total) by mouth daily. What changed: when to take this       Allergies  Allergen Reactions   Sulfa Antibiotics Anaphylaxis    Facial swelling    Codeine Other (See Comments)    BLACK OUTS   Doxycycline Nausea Only   Erythromycin Swelling  Ibuprofen Other (See Comments)    Stomach pain, muscle spasm, GI bleeding   Levaquin [Levofloxacin] Other (See Comments)    Insomnia/trouble breathing   Penicillins Diarrhea    Has patient had a PCN reaction causing immediate rash, facial/tongue/throat swelling, SOB or lightheadedness with hypotension: No Has patient had a PCN reaction causing severe rash involving mucus membranes or skin necrosis: No Has patient had a PCN reaction that required hospitalization Unknown Has patient had a PCN reaction occurring within the last 10 years: No If all of the above answers are "NO", then may proceed with Cephalosporin use.     Consultations:  GI   Procedures/Studies: Ct Abdomen  Pelvis Wo Contrast  Result Date: 05/04/2019 CLINICAL DATA:  Abdominal pain.  Diarrhea. EXAM: CT ABDOMEN AND PELVIS WITHOUT CONTRAST TECHNIQUE: Multidetector CT imaging of the abdomen and pelvis was performed following the standard protocol without IV contrast. COMPARISON:  CT 11/14/2018 FINDINGS: Lower chest: Lung bases are clear.  Large hiatal hernia. Hepatobiliary: Gallbladder is absent. Normal appearance of the liver. Extrahepatic bile duct appears to be prominent and similar to the previous examination. Pancreas: Unremarkable. No pancreatic ductal dilatation or surrounding inflammatory changes. Spleen: Again noted is a low-density lesion involving the superior aspect of the spleen. This lesion roughly measures 3.0 x 2.6 x 2.3 cm. Lesion measured 3.0 x 2.5 x 1.4 cm in April 2020. Lesion was probably present in 2017 but clearly much smaller. Adrenals/Urinary Tract: Normal adrenal glands. Low-density renal structures probably represent small cysts. No hydronephrosis. Urinary bladder is unremarkable. Negative for kidney stones. Stomach/Bowel: Large hiatal hernia. Normal appearance of the duodenum. Mild inflammatory changes around the sigmoid colon and there are few colonic diverticula involving the sigmoid colon. Mild pericolonic edema in left lower quadrant involving the proximal sigmoid colon and distal descending colon. Mild distention of the cecum is similar to the previous examination. No small bowel dilatation. Vascular/Lymphatic: Atherosclerotic calcifications in the aorta and iliac arteries without aneurysm. No significant lymph node enlargement in the abdomen or pelvis. Reproductive: Again noted is a low-density structure involving the left adnexa measuring roughly 4.2 cm and stable from the exam in April 2020 and minimally enlarged since 2017. Uterus has been removed. Other: Small amount of fluid in the pelvis.  Negative for free air. Musculoskeletal: Chronic compression deformity involving superior  endplate of L4. Facet arthropathy in the lumbar spine. IMPRESSION: 1. Trace free fluid in the pelvis and concern for mild inflammatory changes around the sigmoid colon. There are colonic diverticula in this area. Unfortunately, there is significant motion artifact in this area that limits evaluation of the pelvis. Cannot exclude diverticulitis involving the sigmoid colon. No evidence for an abscess collection. 2. Low-density splenic lesion is nonspecific but could be cystic. Lesion may have slightly enlarged since April 2020 but clearly enlarged since 2017. Based on the evidence for enlargement since 2017, consider further characterization of the splenic lesion with MRI or follow-up CT in 3-6 months to ensure stability. 3. Minimal change in the low-density left adnexal lesion. This probably represents a cystic structure and favor a benign etiology based on the minimal change since 2017. 4. Large hiatal hernia. 5. Probable bilateral renal cysts. Electronically Signed   By: Markus Daft M.D.   On: 05/04/2019 19:19   Dg Chest Port 1 View  Result Date: 05/04/2019 CLINICAL DATA:  Nausea vomiting abdominal pain EXAM: PORTABLE CHEST 1 VIEW COMPARISON:  April 17, 2016 FINDINGS: There is mild cardiomegaly. Aortic knob calcifications. There is prominence to the  pulmonary vasculature. Air fluid-filled hiatal hernia is present. Degenerative changes in bilateral shoulders. IMPRESSION: Mild cardiomegaly.  Pulmonary vascular congestion. Large hiatal hernia. Electronically Signed   By: Prudencio Pair M.D.   On: 05/04/2019 17:29        Discharge Exam: Vitals:   05/06/19 1334 05/06/19 2244  BP: 130/64 137/80  Pulse: 85 (!) 107  Resp: 20 20  Temp: 98.1 F (36.7 C) 98.9 F (37.2 C)  SpO2: 98% 98%   Vitals:   05/06/19 0510 05/06/19 1334 05/06/19 2244 05/07/19 0500  BP: 132/66 130/64 137/80   Pulse: 96 85 (!) 107   Resp: 20 20 20    Temp: 98.3 F (36.8 C) 98.1 F (36.7 C) 98.9 F (37.2 C)   TempSrc: Oral Oral  Oral   SpO2: 98% 98% 98%   Weight: 86.9 kg   87 kg  Height:        General: Pt is alert, awake, not in acute distress Cardiovascular: RRR, S1/S2 +, no rubs, no gallops Respiratory: CTA bilaterally, no wheezing, no rhonchi Abdominal: Soft, NT, ND, bowel sounds + Extremities: no edema, no cyanosis   The results of significant diagnostics from this hospitalization (including imaging, microbiology, ancillary and laboratory) are listed below for reference.    Significant Diagnostic Studies: Ct Abdomen Pelvis Wo Contrast  Result Date: 05/04/2019 CLINICAL DATA:  Abdominal pain.  Diarrhea. EXAM: CT ABDOMEN AND PELVIS WITHOUT CONTRAST TECHNIQUE: Multidetector CT imaging of the abdomen and pelvis was performed following the standard protocol without IV contrast. COMPARISON:  CT 11/14/2018 FINDINGS: Lower chest: Lung bases are clear.  Large hiatal hernia. Hepatobiliary: Gallbladder is absent. Normal appearance of the liver. Extrahepatic bile duct appears to be prominent and similar to the previous examination. Pancreas: Unremarkable. No pancreatic ductal dilatation or surrounding inflammatory changes. Spleen: Again noted is a low-density lesion involving the superior aspect of the spleen. This lesion roughly measures 3.0 x 2.6 x 2.3 cm. Lesion measured 3.0 x 2.5 x 1.4 cm in April 2020. Lesion was probably present in 2017 but clearly much smaller. Adrenals/Urinary Tract: Normal adrenal glands. Low-density renal structures probably represent small cysts. No hydronephrosis. Urinary bladder is unremarkable. Negative for kidney stones. Stomach/Bowel: Large hiatal hernia. Normal appearance of the duodenum. Mild inflammatory changes around the sigmoid colon and there are few colonic diverticula involving the sigmoid colon. Mild pericolonic edema in left lower quadrant involving the proximal sigmoid colon and distal descending colon. Mild distention of the cecum is similar to the previous examination. No small bowel  dilatation. Vascular/Lymphatic: Atherosclerotic calcifications in the aorta and iliac arteries without aneurysm. No significant lymph node enlargement in the abdomen or pelvis. Reproductive: Again noted is a low-density structure involving the left adnexa measuring roughly 4.2 cm and stable from the exam in April 2020 and minimally enlarged since 2017. Uterus has been removed. Other: Small amount of fluid in the pelvis.  Negative for free air. Musculoskeletal: Chronic compression deformity involving superior endplate of L4. Facet arthropathy in the lumbar spine. IMPRESSION: 1. Trace free fluid in the pelvis and concern for mild inflammatory changes around the sigmoid colon. There are colonic diverticula in this area. Unfortunately, there is significant motion artifact in this area that limits evaluation of the pelvis. Cannot exclude diverticulitis involving the sigmoid colon. No evidence for an abscess collection. 2. Low-density splenic lesion is nonspecific but could be cystic. Lesion may have slightly enlarged since April 2020 but clearly enlarged since 2017. Based on the evidence for enlargement since 2017, consider further  characterization of the splenic lesion with MRI or follow-up CT in 3-6 months to ensure stability. 3. Minimal change in the low-density left adnexal lesion. This probably represents a cystic structure and favor a benign etiology based on the minimal change since 2017. 4. Large hiatal hernia. 5. Probable bilateral renal cysts. Electronically Signed   By: Markus Daft M.D.   On: 05/04/2019 19:19   Dg Chest Port 1 View  Result Date: 05/04/2019 CLINICAL DATA:  Nausea vomiting abdominal pain EXAM: PORTABLE CHEST 1 VIEW COMPARISON:  April 17, 2016 FINDINGS: There is mild cardiomegaly. Aortic knob calcifications. There is prominence to the pulmonary vasculature. Air fluid-filled hiatal hernia is present. Degenerative changes in bilateral shoulders. IMPRESSION: Mild cardiomegaly.  Pulmonary  vascular congestion. Large hiatal hernia. Electronically Signed   By: Prudencio Pair M.D.   On: 05/04/2019 17:29     Microbiology: Recent Results (from the past 240 hour(s))  Urine culture     Status: None   Collection Time: 05/04/19  4:43 PM   Specimen: Urine, Random  Result Value Ref Range Status   Specimen Description   Final    URINE, RANDOM Performed at [facility], [address] [place], [place] [address]    Special Requests   Final    NONE Performed at [facility], [address] [place], [place] [address]    Culture   Final    NO GROWTH Performed at [facility], [place] [address] [place], [place] [address]    Report Status 05/05/2019 FINAL  Final  C difficile quick scan w PCR reflex     Status: None   Collection Time: 05/04/19  4:43 PM   Specimen: STOOL  Result Value Ref Range Status   C Diff antigen NEGATIVE NEGATIVE Final   C Diff toxin NEGATIVE NEGATIVE Final   C Diff interpretation No C. difficile detected.  Final    Comment: Performed at [facility], [address] [place], [place] [address]  SARS Coronavirus 2 [facility] order, Performed in [facility] hospital lab) Nasopharyngeal Nasopharyngeal Swab     Status: None   Collection Time: 05/04/19  6:11 PM   Specimen: Nasopharyngeal Swab  Result Value Ref Range Status   SARS Coronavirus 2 NEGATIVE NEGATIVE Final    Comment: (NOTE) If result is NEGATIVE SARS-CoV-2 target nucleic acids are NOT DETECTED. The SARS-CoV-2 RNA is generally detectable in upper and lower  respiratory specimens during the acute phase of infection. The lowest  concentration of SARS-CoV-2 viral copies this assay can detect is 250  copies / mL. A negative result does not preclude SARS-CoV-2 infection  and should not be used as the sole basis for treatment or other  patient management decisions.  A negative result may occur with  improper specimen collection / handling, submission of specimen other  than nasopharyngeal swab, presence  of viral mutation(s) within the  areas targeted by this assay, and inadequate number of viral copies  (<250 copies / mL). A negative result must be combined with clinical  observations, patient history, and epidemiological information. If result is POSITIVE SARS-CoV-2 target nucleic acids are DETECTED. The SARS-CoV-2 RNA is generally detectable in upper and lower  respiratory specimens dur ing the acute phase of infection.  Positive  results are indicative of active infection with SARS-CoV-2.  Clinical  correlation with patient history and other diagnostic information is  necessary to determine patient infection status.  Positive results do  not rule out bacterial infection or co-infection with other  viruses. If result is PRESUMPTIVE POSTIVE SARS-CoV-2 nucleic acids MAY BE PRESENT.   A presumptive positive result was obtained on the submitted specimen  and confirmed on repeat testing.  While 2019 novel coronavirus  (SARS-CoV-2) nucleic acids may be present in the submitted sample  additional confirmatory testing may be necessary for epidemiological  and / or clinical management purposes  to differentiate between  SARS-CoV-2 and other Sarbecovirus currently known to infect humans.  If clinically indicated additional testing with an alternate test  methodology [phone]) is advised. The SARS-CoV-2 RNA is generally  detectable in upper and lower respiratory sp ecimens during the acute  phase of infection. The expected result is Negative. Fact Sheet for Patients:  StrictlyIdeas.no Fact Sheet for Healthcare Providers: BankingDealers.co.za This test is not yet approved or cleared by the [place] FDA and has been authorized for detection and/or diagnosis of SARS-CoV-2 by FDA under an Emergency Use Authorization (EUA).  This EUA will remain in effect (meaning this test can be used) for the duration of the COVID-19 declaration under Section  564(b)(1) of the Act, 21 U.S.C. section 360bbb-3(b)(1), unless the authorization is terminated or revoked sooner. Performed at [facility], [address] [place], [place] [address]      Labs: Basic Metabolic Panel: Recent Labs  Lab 05/04/19 1653 05/05/19 0322 05/06/19 0717 05/07/19 0715  NA 141 140 142 143  K 4.1 4.1 4.0 3.8  CL 112* 113* 111 113*  CO2 21* 23 23 24   GLUCOSE 140* 120* 107* 112*  BUN 31* 32* 25* 17  CREATININE 1.37* 1.48* 1.47* 1.39*  CALCIUM 8.9 8.6* 8.7* 8.8*   Liver Function Tests: Recent Labs  Lab 05/04/19 1653 05/05/19 0322 05/06/19 0717 05/07/19 0715  AST 22 131* 85* 84*  ALT 22 112* 112* 100*  ALKPHOS 75 63 72 89  BILITOT 0.4 0.3 0.7 0.6  PROT 6.2* 5.6* 6.0* 5.7*  ALBUMIN 3.5 3.0* 3.3* 3.1*   Recent Labs  Lab 05/04/19 1653  LIPASE 33   No results for input(s): AMMONIA in the last 168 hours. CBC: Recent Labs  Lab 05/04/19 1653 05/05/19 0322 05/06/19 0717 05/07/19 0715  WBC 9.8 20.9* 12.1* 7.9  NEUTROABS 7.9*  --  9.3*  --   HGB 11.1* 9.5* 9.5* 9.3*  HCT 36.9 31.2* 31.6* 30.6*  MCV 101.1* 100.3* 101.9* 100.3*  PLT 209 190 179 172   Cardiac Enzymes: No results for input(s): CKTOTAL, CKMB, CKMBINDEX, TROPONINI in the last 168 hours. BNP: Invalid input(s): POCBNP CBG: No results for input(s): GLUCAP in the last 168 hours.  Time coordinating discharge:  36 minutes  Signed:  Orson Eva, DO [facility] Pager: [phone] 05/07/2019, 12:02 PM

## 2019-05-07 NOTE — TOC Transition Note (Signed)
Transition of Care [facility]) - CM/SW Discharge Note   Patient Details  Name: Tamara Stein MRN: [id number] Date of Birth: 05-18-1941  Transition of Care [facility]) CM/SW Contact:  Latanya Maudlin, RN Phone Number: 05/07/2019, 1:12 PM   Clinical Narrative: Patient to be discharged per MD order. Orders in place for home health services. [facility]. Referral to [facility] with [place] care. No DME needs. Family to transport.       Final next level of care: Home w Home Health Services Barriers to Discharge: No Barriers Identified   Patient Goals and CMS Choice   CMS Medicare.gov Compare Post Acute Care list provided to:: Patient Choice offered to / list presented to : Patient  Discharge Placement                       Discharge Plan and Services                          HH Arranged: PT, RN, Nurse's Aide [facility] Agency: [place] ([facility]) Date [place]: 05/07/19 Time [place]: 1312 Representative spoke with at [place]: [place] ([place]) Interventions     Readmission Risk Interventions Readmission Risk Prevention Plan 05/07/2019  Post Dischage Appt Complete  Medication Screening Complete  Transportation Screening Complete  Some recent data might be hidden

## 2019-05-10 LAB — GI PATHOGEN PANEL BY PCR, STOOL

## 2019-05-11 ENCOUNTER — Other Ambulatory Visit: Payer: Self-pay

## 2019-05-12 ENCOUNTER — Ambulatory Visit (INDEPENDENT_AMBULATORY_CARE_PROVIDER_SITE_OTHER): Payer: Medicare Other | Admitting: *Deleted

## 2019-05-12 DIAGNOSIS — Z23 Encounter for immunization: Secondary | ICD-10-CM

## 2019-05-16 DIAGNOSIS — M25562 Pain in left knee: Secondary | ICD-10-CM | POA: Diagnosis not present

## 2019-05-16 DIAGNOSIS — M25561 Pain in right knee: Secondary | ICD-10-CM | POA: Diagnosis not present

## 2019-05-16 DIAGNOSIS — M19012 Primary osteoarthritis, left shoulder: Secondary | ICD-10-CM | POA: Diagnosis not present

## 2019-05-16 DIAGNOSIS — M19011 Primary osteoarthritis, right shoulder: Secondary | ICD-10-CM | POA: Diagnosis not present

## 2019-05-18 ENCOUNTER — Ambulatory Visit (INDEPENDENT_AMBULATORY_CARE_PROVIDER_SITE_OTHER): Payer: Medicare Other | Admitting: Family Medicine

## 2019-05-18 ENCOUNTER — Encounter: Payer: Self-pay | Admitting: Internal Medicine

## 2019-05-18 ENCOUNTER — Other Ambulatory Visit: Payer: Self-pay

## 2019-05-18 ENCOUNTER — Encounter: Payer: Self-pay | Admitting: Family Medicine

## 2019-05-18 VITALS — BP 130/85 | HR 80 | Temp 98.0°F

## 2019-05-18 DIAGNOSIS — D7389 Other diseases of spleen: Secondary | ICD-10-CM

## 2019-05-18 DIAGNOSIS — R7989 Other specified abnormal findings of blood chemistry: Secondary | ICD-10-CM

## 2019-05-18 DIAGNOSIS — Z09 Encounter for follow-up examination after completed treatment for conditions other than malignant neoplasm: Secondary | ICD-10-CM

## 2019-05-18 DIAGNOSIS — K5792 Diverticulitis of intestine, part unspecified, without perforation or abscess without bleeding: Secondary | ICD-10-CM | POA: Diagnosis not present

## 2019-05-18 NOTE — Patient Instructions (Addendum)
You had labs performed today.  You will be contacted with the results of the labs once they are available, usually in the next 3 business days for routine lab work.  If you have an active my chart account, they will be released to your MyChart.  If you prefer to have these labs released to you via telephone, please let us know.  If you had a pap smear or biopsy performed, expect to be contacted in about 7-10 days.  You have been referred to Dr. Henrene Pastor with [facility].

## 2019-05-18 NOTE — Progress Notes (Signed)
Subjective: CC: hospital follow up PCP: Janora Norlander, DO CMK:[id number] Tamara Stein is a 78 y.o. female presenting to clinic today for:  1.  Diverticulitis Patient was seen at the end of September for acute abdominal pain.  CT abdomen pelvis demonstrated acute diverticulitis as well as an enlarging splenic lesion of uncertain etiology.  She was hydrated gently with IV fluids and treated with IV antibiotics and ultimately transitioned over to oral Augmentin.  She was discharged home with 7 additional days of Augmentin.  At the time of discharge her abdominal pain had resolved.  Her Lipitor was held at discharge due to elevated LFTs.  She is here for interval follow-up and repeat labs.   She notes that she has some residual soreness in the left lower abdomen but no overt abdominal pain.  She is tolerating p.o. intake.  She is trying to hydrate with water but still occasionally indulges in a small diet soda.  She has not yet set up an appoint with GI because she has not had any evaluation with GI in several years.  She was previously being seen by digestive health but she wants to transfer her care to Dr. Henrene Pastor with [facility].  Denies any diarrhea.  No rectal bleeding.  She did not discontinue the atorvastatin but did hold the lisinopril.  She was not aware that the atorvastatin was supposed to be held.  ROS: Per HPI  Allergies  Allergen Reactions  . Sulfa Antibiotics Anaphylaxis    Facial swelling   . Codeine Other (See Comments)    BLACK OUTS  . Doxycycline Nausea Only  . Erythromycin Swelling  . Ibuprofen Other (See Comments)    Stomach pain, muscle spasm, GI bleeding  . Levaquin [Levofloxacin] Other (See Comments)    Insomnia/trouble breathing  . Penicillins Diarrhea    Has patient had a PCN reaction causing immediate rash, facial/tongue/throat swelling, SOB or lightheadedness with hypotension: No Has patient had a PCN reaction causing severe rash involving mucus membranes or skin  necrosis: No Has patient had a PCN reaction that required hospitalization Unknown Has patient had a PCN reaction occurring within the last 10 years: No If all of the above answers are "NO", then may proceed with Cephalosporin use.    Past Medical History:  Diagnosis Date  . Allergy   . Anxiety   . Cataract   . 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 Chronic kidney disease   . Chronic knee pain   . Depression   . GERD (gastroesophageal reflux disease)   . Hyperlipidemia   . Hypertension   . Thyroid disease     Current Outpatient Medications:  .  amLODipine (NORVASC) 10 MG tablet, TAKE 1 TABLET DAILY FOR    BLOOD PRESSURE (Patient taking differently: Take 10 mg by mouth every morning. ), Disp: 90 tablet, Rfl: 1 .  amoxicillin-clavulanate (AUGMENTIN) 875-125 MG tablet, Take 1 tablet by mouth every 12 (twelve) hours., Disp: 14 tablet, Rfl: 0 .  aspirin EC 81 MG tablet, Take 81 mg by mouth every morning. , Disp: , Rfl:  .  atorvastatin (LIPITOR) 40 MG tablet, Take 1 tablet (40 mg total) by mouth at bedtime. Do not restart until instructed by MD, Disp: 90 tablet, Rfl: 0 .  Bacillus Coagulans-Inulin (ALIGN PREBIOTIC-PROBIOTIC) 5-1.25 MG-GM CHEW, Chew 1 tablet by mouth daily., Disp: 30 tablet, Rfl: 0 .  Calcium Carbonate-Vitamin D (CALTRATE 600+D PO), Take 1 tablet by mouth daily., Disp: , Rfl:  .  cetirizine (ZYRTEC) 10 MG  chewable tablet, Chew 10 mg by mouth daily., Disp: , Rfl:  .  diclofenac (VOLTAREN) 50 MG EC tablet, TAKE 1 TABLET BY MOUTH (50MG TOTAL) THREE TIMES DAILY (Patient taking differently: Take 50-100 mg by mouth See admin instructions. 100MG IN THE MORNING AND 50MG AT BEDTIME), Disp: 270 tablet, Rfl: 1 .  DULoxetine (CYMBALTA) 60 MG capsule, Take 1 capsule (60 mg total) by mouth daily. (Patient taking differently: Take 60 mg by mouth every morning. ), Disp: 90 capsule, Rfl: 0 .  furosemide (LASIX) 20 MG tablet, Take 1 tablet (20 mg total) by mouth daily as needed. As needed for  swelling, Disp: 90 tablet, Rfl: 1 .  levothyroxine (SYNTHROID) 50 MCG tablet, TAKE 1 TABLET ONCE DAILY (Patient taking differently: Take 50 mcg by mouth daily before breakfast. ), Disp: 90 tablet, Rfl: 1 .  meclizine (ANTIVERT) 25 MG tablet, Take 25 mg by mouth 3 (three) times daily as needed for dizziness., Disp: , Rfl:  .  Multiple Vitamin (MULTIVITAMIN WITH MINERALS) TABS tablet, Take 1 tablet by mouth daily., Disp: , Rfl:  .  omeprazole (PRILOSEC) 40 MG capsule, TAKE 1 CAPSULE DAILY (Patient taking differently: Take 40 mg by mouth daily before breakfast. ), Disp: 90 capsule, Rfl: 1 .  ondansetron (ZOFRAN ODT) 4 MG disintegrating tablet, Take 1 tablet (4 mg total) by mouth every 8 (eight) hours as needed., Disp: 10 tablet, Rfl: 0 .  raloxifene (EVISTA) 60 MG tablet, Take 1 tablet (60 mg total) by mouth daily. (Patient taking differently: Take 60 mg by mouth every morning. ), Disp: 90 tablet, Rfl: 1 Social History   Socioeconomic History  . Marital status: Widowed    Spouse name: Not on file  . Number of children: Not on file  . Years of education: Not on file  . Highest education level: Not on file  Occupational History  . Not on file  Social Needs  . Financial resource strain: Not hard at all  . Food insecurity    Worry: Never true    Inability: Never true  . Transportation needs    Medical: Not on file    Non-medical: Not on file  Tobacco Use  . Smoking status: Never Smoker  . Smokeless tobacco: Never Used  Substance and Sexual Activity  . Alcohol use: No  . Drug use: No  . Sexual activity: Never  Lifestyle  . Physical activity    Days per week: Not on file    Minutes per session: Not on file  . Stress: Not on file  Relationships  . Social Herbalist on phone: Not on file    Gets together: Not on file    Attends religious service: Not on file    Active member of club or organization: Not on file    Attends meetings of clubs or organizations: Not on file     Relationship status: Not on file  . Intimate partner violence    Fear of current or ex partner: Not on file    Emotionally abused: Not on file    Physically abused: Not on file    Forced sexual activity: Not on file  Other Topics Concern  . Not on file  Social History Narrative  . Not on file   Family History  Problem Relation Age of Onset  . Arthritis Sister   . Hyperlipidemia Sister   . Hypertension Sister   . Cancer Brother   . Diabetes Brother   . Heart disease  Brother     Objective: Office vital signs reviewed. BP 130/85   Pulse 80   Temp 98 F (36.7 C) (Temporal)   SpO2 97%   Physical Examination:  General: Awake, alert, appears thinner than previous check, chronically ill appearing, No acute distress HEENT: Normal, sclera white, MMM GI: soft, non-tender, non-distended, bowel sounds present x4, no hepatomegaly, no splenomegaly, no masses Psych: oriented. Interacting with provider appropriately  Assessment/ Plan: 78 y.o. female   1. Hospital discharge follow-up I reviewed the hospital discharge notes and recommendations.  I have also reviewed the imaging results  2. Diverticulitis Resolved.  Some residual left-sided soreness but no tenderness palpation on exam.  Nothing to suggest ongoing infection.  I placed a referral to gastroenterology with Dr. Henrene Pastor as requested - CBC - Ambulatory referral to Gastroenterology  3. Elevated liver function tests She never helped with the statin.  Will check liver function tests. - CMP14+EGFR - Ambulatory referral to Gastroenterology  4. Splenic lesion Uncertain etiology.  Wants to be evaluated by MRI.  This is been ordered - MR Abdomen W Wo Contrast; Future - Ambulatory referral to Gastroenterology   Orders Placed This Encounter  Procedures  . MR Abdomen W Wo Contrast    Standing Status:   Future    Standing Expiration Date:   07/17/2020    Order Specific Question:   ** REASON FOR EXAM (FREE TEXT)    Answer:    enlarging splenic lesion noted on CT abd    Order Specific Question:   If indicated for the ordered procedure, I authorize the administration of contrast media per Radiology protocol    Answer:   Yes    Order Specific Question:   What is the patient's sedation requirement?    Answer:   No Sedation    Order Specific Question:   Does the patient have a pacemaker or implanted devices?    Answer:   No    Order Specific Question:   Radiology Contrast Protocol - do NOT remove file path    Answer:   _0 [email or web address]    Order Specific Question:   Preferred imaging location?    Answer:   Internal  . CBC  . CMP14+EGFR  . Ambulatory referral to Gastroenterology    Referral Priority:   Routine    Referral Type:   Consultation    Referral Reason:   Specialty Services Required    Number of Visits Requested:   1   No orders of the defined types were placed in this encounter.    Janora Norlander, DO [place] [phone]

## 2019-05-19 ENCOUNTER — Telehealth: Payer: Self-pay | Admitting: Family Medicine

## 2019-05-19 LAB — CMP14+EGFR
ALT: 19 IU/L (ref 0–32)
AST: 18 IU/L (ref 0–40)
Albumin/Globulin Ratio: 1.8 (ref 1.2–2.2)
Albumin: 4.2 g/dL (ref 3.7–4.7)
Alkaline Phosphatase: 74 IU/L (ref 39–117)
BUN/Creatinine Ratio: 26 (ref 12–28)
BUN: 38 mg/dL — ABNORMAL HIGH (ref 8–27)
Bilirubin Total: 0.3 mg/dL (ref 0.0–1.2)
CO2: 20 mmol/L (ref 20–29)
Calcium: 9.2 mg/dL (ref 8.7–10.3)
Chloride: 103 mmol/L (ref 96–106)
Creatinine, Ser: 1.45 mg/dL — ABNORMAL HIGH (ref 0.57–1.00)
GFR calc Af Amer: 40 mL/min/{1.73_m2} — ABNORMAL LOW (ref >59)
GFR calc non Af Amer: 34 mL/min/{1.73_m2} — ABNORMAL LOW (ref >59)
Globulin, Total: 2.4 g/dL (ref 1.5–4.5)
Glucose: 109 mg/dL — ABNORMAL HIGH (ref 65–99)
Potassium: 4 mmol/L (ref 3.5–5.2)
Sodium: 141 mmol/L (ref 134–144)
Total Protein: 6.6 g/dL (ref 6.0–8.5)

## 2019-05-19 LAB — CBC
Hematocrit: 33.3 % — ABNORMAL LOW (ref 34.0–46.6)
Hemoglobin: 11 g/dL — ABNORMAL LOW (ref 11.1–15.9)
MCH: 31.2 pg (ref 26.6–33.0)
MCHC: 33 g/dL (ref 31.5–35.7)
MCV: 94 fL (ref 79–97)
Platelets: 276 10*3/uL (ref 150–450)
RBC: 3.53 x10E6/uL — ABNORMAL LOW (ref 3.77–5.28)
RDW: 13.2 % (ref 11.7–15.4)
WBC: 9.8 10*3/uL (ref 3.4–10.8)

## 2019-05-24 ENCOUNTER — Ambulatory Visit: Payer: Medicare Other | Admitting: *Deleted

## 2019-05-24 NOTE — Chronic Care Management (AMB) (Signed)
  Chronic Care Management   Outreach Note  05/24/2019 Name: Tamara Stein MRN: [id number] DOB: 06/29/1941  Referred by: Janora Norlander, DO Reason for referral : Chronic Care Management (Initial CCM outreach. Recent hospital discharge )   An unsuccessful telephone outreach was attempted today. The patient was referred to the case management team by for assistance with care management and care coordination.   Follow Up Plan: A HIPPA compliant phone message was left for the patient providing contact information and requesting a return call.  The care management team will reach out to the patient again over the next 7 days.    Chong Sicilian, BSN, RN-BC Embedded Chronic Care Manager [facility] [place] [facility] Direct Dial: [phone]

## 2019-05-26 ENCOUNTER — Ambulatory Visit (HOSPITAL_COMMUNITY): Payer: Medicare Other

## 2019-05-27 ENCOUNTER — Ambulatory Visit (HOSPITAL_COMMUNITY)
Admission: RE | Admit: 2019-05-27 | Discharge: 2019-05-27 | Disposition: A | Discharge status: Home / Self Care | Payer: Medicare Other | Source: Ambulatory Visit | Attending: Family Medicine | Admitting: Family Medicine

## 2019-05-27 ENCOUNTER — Other Ambulatory Visit: Payer: Self-pay

## 2019-05-27 DIAGNOSIS — D7389 Other diseases of spleen: Secondary | ICD-10-CM | POA: Diagnosis not present

## 2019-05-27 DIAGNOSIS — D1771 Benign lipomatous neoplasm of kidney: Secondary | ICD-10-CM | POA: Diagnosis not present

## 2019-05-27 DIAGNOSIS — K449 Diaphragmatic hernia without obstruction or gangrene: Secondary | ICD-10-CM | POA: Diagnosis not present

## 2019-05-27 DIAGNOSIS — K862 Cyst of pancreas: Secondary | ICD-10-CM | POA: Diagnosis not present

## 2019-05-27 DIAGNOSIS — D181 Lymphangioma, any site: Secondary | ICD-10-CM | POA: Diagnosis not present

## 2019-05-27 MED ORDER — GADOBUTROL 1 MMOL/ML IV SOLN
9.0000 mL | Freq: Once | INTRAVENOUS | Status: AC | PRN
  Administered 2019-05-27: 9 mL via INTRAVENOUS

## 2019-05-30 ENCOUNTER — Other Ambulatory Visit: Payer: Self-pay

## 2019-05-30 ENCOUNTER — Encounter (HOSPITAL_COMMUNITY): Payer: Self-pay | Admitting: Emergency Medicine

## 2019-05-30 ENCOUNTER — Inpatient Hospital Stay (HOSPITAL_COMMUNITY)
Admission: EM | Admit: 2019-05-30 | Discharge: 2019-06-05 | DRG: 392 | Disposition: A | Discharge status: Home / Self Care | Payer: Medicare Other | Attending: Family Medicine | Admitting: Family Medicine

## 2019-05-30 ENCOUNTER — Encounter: Payer: Self-pay | Admitting: *Deleted

## 2019-05-30 DIAGNOSIS — B9620 Unspecified Escherichia coli [E. coli] as the cause of diseases classified elsewhere: Secondary | ICD-10-CM | POA: Diagnosis present

## 2019-05-30 DIAGNOSIS — E860 Dehydration: Secondary | ICD-10-CM

## 2019-05-30 DIAGNOSIS — K862 Cyst of pancreas: Secondary | ICD-10-CM | POA: Diagnosis present

## 2019-05-30 DIAGNOSIS — I700 Atherosclerosis of aorta: Secondary | ICD-10-CM | POA: Diagnosis present

## 2019-05-30 DIAGNOSIS — R000 Tachycardia, unspecified: Secondary | ICD-10-CM | POA: Diagnosis not present

## 2019-05-30 DIAGNOSIS — Z7982 Long term (current) use of aspirin: Secondary | ICD-10-CM

## 2019-05-30 DIAGNOSIS — N390 Urinary tract infection, site not specified: Secondary | ICD-10-CM | POA: Diagnosis present

## 2019-05-30 DIAGNOSIS — A09 Infectious gastroenteritis and colitis, unspecified: Secondary | ICD-10-CM | POA: Diagnosis not present

## 2019-05-30 DIAGNOSIS — Z886 Allergy status to analgesic agent status: Secondary | ICD-10-CM

## 2019-05-30 DIAGNOSIS — D7389 Other diseases of spleen: Secondary | ICD-10-CM | POA: Diagnosis present

## 2019-05-30 DIAGNOSIS — Z8349 Family history of other endocrine, nutritional and metabolic diseases: Secondary | ICD-10-CM

## 2019-05-30 DIAGNOSIS — I959 Hypotension, unspecified: Secondary | ICD-10-CM | POA: Diagnosis present

## 2019-05-30 DIAGNOSIS — Z993 Dependence on wheelchair: Secondary | ICD-10-CM

## 2019-05-30 DIAGNOSIS — Z79899 Other long term (current) drug therapy: Secondary | ICD-10-CM

## 2019-05-30 DIAGNOSIS — Z6834 Body mass index (BMI) 34.0-34.9, adult: Secondary | ICD-10-CM

## 2019-05-30 DIAGNOSIS — K449 Diaphragmatic hernia without obstruction or gangrene: Secondary | ICD-10-CM | POA: Diagnosis present

## 2019-05-30 DIAGNOSIS — Z881 Allergy status to other antibiotic agents status: Secondary | ICD-10-CM

## 2019-05-30 DIAGNOSIS — I10 Essential (primary) hypertension: Secondary | ICD-10-CM

## 2019-05-30 DIAGNOSIS — Z791 Long term (current) use of non-steroidal anti-inflammatories (NSAID): Secondary | ICD-10-CM

## 2019-05-30 DIAGNOSIS — Z87440 Personal history of urinary (tract) infections: Secondary | ICD-10-CM

## 2019-05-30 DIAGNOSIS — Z66 Do not resuscitate: Secondary | ICD-10-CM | POA: Diagnosis not present

## 2019-05-30 DIAGNOSIS — K644 Residual hemorrhoidal skin tags: Secondary | ICD-10-CM | POA: Diagnosis present

## 2019-05-30 DIAGNOSIS — G4730 Sleep apnea, unspecified: Secondary | ICD-10-CM | POA: Diagnosis present

## 2019-05-30 DIAGNOSIS — Z8673 Personal history of transient ischemic attack (TIA), and cerebral infarction without residual deficits: Secondary | ICD-10-CM

## 2019-05-30 DIAGNOSIS — Z880 Allergy status to penicillin: Secondary | ICD-10-CM

## 2019-05-30 DIAGNOSIS — Z79890 Hormone replacement therapy (postmenopausal): Secondary | ICD-10-CM

## 2019-05-30 DIAGNOSIS — K08109 Complete loss of teeth, unspecified cause, unspecified class: Secondary | ICD-10-CM | POA: Diagnosis present

## 2019-05-30 DIAGNOSIS — R110 Nausea: Secondary | ICD-10-CM | POA: Diagnosis not present

## 2019-05-30 DIAGNOSIS — I129 Hypertensive chronic kidney disease with stage 1 through stage 4 chronic kidney disease, or unspecified chronic kidney disease: Secondary | ICD-10-CM | POA: Diagnosis present

## 2019-05-30 DIAGNOSIS — M1990 Unspecified osteoarthritis, unspecified site: Secondary | ICD-10-CM | POA: Diagnosis present

## 2019-05-30 DIAGNOSIS — Z833 Family history of diabetes mellitus: Secondary | ICD-10-CM

## 2019-05-30 DIAGNOSIS — N183 Chronic kidney disease, stage 3 unspecified: Secondary | ICD-10-CM | POA: Diagnosis present

## 2019-05-30 DIAGNOSIS — Z9049 Acquired absence of other specified parts of digestive tract: Secondary | ICD-10-CM

## 2019-05-30 DIAGNOSIS — Z7401 Bed confinement status: Secondary | ICD-10-CM

## 2019-05-30 DIAGNOSIS — E039 Hypothyroidism, unspecified: Secondary | ICD-10-CM | POA: Diagnosis present

## 2019-05-30 DIAGNOSIS — G319 Degenerative disease of nervous system, unspecified: Secondary | ICD-10-CM | POA: Diagnosis present

## 2019-05-30 DIAGNOSIS — Z20828 Contact with and (suspected) exposure to other viral communicable diseases: Secondary | ICD-10-CM | POA: Diagnosis not present

## 2019-05-30 DIAGNOSIS — Z90710 Acquired absence of both cervix and uterus: Secondary | ICD-10-CM

## 2019-05-30 DIAGNOSIS — F329 Major depressive disorder, single episode, unspecified: Secondary | ICD-10-CM | POA: Diagnosis present

## 2019-05-30 DIAGNOSIS — R471 Dysarthria and anarthria: Secondary | ICD-10-CM | POA: Diagnosis present

## 2019-05-30 DIAGNOSIS — K219 Gastro-esophageal reflux disease without esophagitis: Secondary | ICD-10-CM | POA: Diagnosis present

## 2019-05-30 DIAGNOSIS — Z8261 Family history of arthritis: Secondary | ICD-10-CM

## 2019-05-30 DIAGNOSIS — R945 Abnormal results of liver function studies: Secondary | ICD-10-CM | POA: Diagnosis present

## 2019-05-30 DIAGNOSIS — Z882 Allergy status to sulfonamides status: Secondary | ICD-10-CM

## 2019-05-30 DIAGNOSIS — K529 Noninfective gastroenteritis and colitis, unspecified: Secondary | ICD-10-CM | POA: Diagnosis not present

## 2019-05-30 DIAGNOSIS — R197 Diarrhea, unspecified: Secondary | ICD-10-CM

## 2019-05-30 DIAGNOSIS — E669 Obesity, unspecified: Secondary | ICD-10-CM | POA: Diagnosis present

## 2019-05-30 DIAGNOSIS — R7989 Other specified abnormal findings of blood chemistry: Secondary | ICD-10-CM

## 2019-05-30 DIAGNOSIS — K5730 Diverticulosis of large intestine without perforation or abscess without bleeding: Secondary | ICD-10-CM | POA: Diagnosis present

## 2019-05-30 DIAGNOSIS — D649 Anemia, unspecified: Secondary | ICD-10-CM | POA: Diagnosis present

## 2019-05-30 DIAGNOSIS — K838 Other specified diseases of biliary tract: Secondary | ICD-10-CM | POA: Diagnosis present

## 2019-05-30 DIAGNOSIS — R52 Pain, unspecified: Secondary | ICD-10-CM | POA: Diagnosis not present

## 2019-05-30 DIAGNOSIS — Z885 Allergy status to narcotic agent status: Secondary | ICD-10-CM

## 2019-05-30 DIAGNOSIS — E785 Hyperlipidemia, unspecified: Secondary | ICD-10-CM | POA: Diagnosis present

## 2019-05-30 DIAGNOSIS — R1084 Generalized abdominal pain: Secondary | ICD-10-CM | POA: Diagnosis not present

## 2019-05-30 DIAGNOSIS — R55 Syncope and collapse: Secondary | ICD-10-CM | POA: Diagnosis not present

## 2019-05-30 DIAGNOSIS — F419 Anxiety disorder, unspecified: Secondary | ICD-10-CM | POA: Diagnosis present

## 2019-05-30 DIAGNOSIS — Z8249 Family history of ischemic heart disease and other diseases of the circulatory system: Secondary | ICD-10-CM

## 2019-05-30 LAB — COMPREHENSIVE METABOLIC PANEL
ALT: 37 U/L (ref 0–44)
AST: 24 U/L (ref 15–41)
Albumin: 3.6 g/dL (ref 3.5–5.0)
Alkaline Phosphatase: 84 U/L (ref 38–126)
Anion gap: 10 (ref 5–15)
BUN: 39 mg/dL — ABNORMAL HIGH (ref 8–23)
CO2: 21 mmol/L — ABNORMAL LOW (ref 22–32)
Calcium: 8.7 mg/dL — ABNORMAL LOW (ref 8.9–10.3)
Chloride: 112 mmol/L — ABNORMAL HIGH (ref 98–111)
Creatinine, Ser: 1.42 mg/dL — ABNORMAL HIGH (ref 0.44–1.00)
GFR calc Af Amer: 41 mL/min — ABNORMAL LOW (ref >60)
GFR calc non Af Amer: 35 mL/min — ABNORMAL LOW (ref >60)
Glucose, Bld: 153 mg/dL — ABNORMAL HIGH (ref 70–99)
Potassium: 4 mmol/L (ref 3.5–5.1)
Sodium: 143 mmol/L (ref 135–145)
Total Bilirubin: 0.3 mg/dL (ref 0.3–1.2)
Total Protein: 6.3 g/dL — ABNORMAL LOW (ref 6.5–8.1)

## 2019-05-30 LAB — CBC
HCT: 38.9 % (ref 36.0–46.0)
Hemoglobin: 11.8 g/dL — ABNORMAL LOW (ref 12.0–15.0)
MCH: 30.6 pg (ref 26.0–34.0)
MCHC: 30.3 g/dL (ref 30.0–36.0)
MCV: 100.8 fL — ABNORMAL HIGH (ref 80.0–100.0)
Platelets: 222 10*3/uL (ref 150–400)
RBC: 3.86 MIL/uL — ABNORMAL LOW (ref 3.87–5.11)
RDW: 14.5 % (ref 11.5–15.5)
WBC: 14.4 10*3/uL — ABNORMAL HIGH (ref 4.0–10.5)
nRBC: 0 % (ref 0.0–0.2)

## 2019-05-30 LAB — LIPASE, BLOOD: Lipase: 31 U/L (ref 11–51)

## 2019-05-30 MED ORDER — SODIUM CHLORIDE 0.9 % IV BOLUS (SEPSIS)
1000.0000 mL | Freq: Once | INTRAVENOUS | Status: AC
  Administered 2019-05-31: 1000 mL via INTRAVENOUS

## 2019-05-30 MED ORDER — SODIUM CHLORIDE 0.9% FLUSH: 3.0000 mL | Freq: Once | INTRAVENOUS | Status: DC

## 2019-05-30 NOTE — ED Triage Notes (Signed)
Pt from home via [facility]. Pt C/O abdominal pain and n/v X 4 hours.

## 2019-05-31 ENCOUNTER — Encounter (HOSPITAL_COMMUNITY): Payer: Self-pay | Admitting: Emergency Medicine

## 2019-05-31 ENCOUNTER — Emergency Department (HOSPITAL_COMMUNITY): Payer: Medicare Other

## 2019-05-31 DIAGNOSIS — E860 Dehydration: Secondary | ICD-10-CM | POA: Diagnosis not present

## 2019-05-31 DIAGNOSIS — K449 Diaphragmatic hernia without obstruction or gangrene: Secondary | ICD-10-CM | POA: Diagnosis not present

## 2019-05-31 DIAGNOSIS — K529 Noninfective gastroenteritis and colitis, unspecified: Secondary | ICD-10-CM | POA: Diagnosis not present

## 2019-05-31 LAB — URINALYSIS, ROUTINE W REFLEX MICROSCOPIC
Bilirubin Urine: NEGATIVE
Glucose, UA: NEGATIVE mg/dL
Hgb urine dipstick: NEGATIVE
Ketones, ur: NEGATIVE mg/dL
Nitrite: POSITIVE — AB
Protein, ur: 30 mg/dL — AB
Specific Gravity, Urine: 1.023 (ref 1.005–1.030)
pH: 5 (ref 5.0–8.0)

## 2019-05-31 LAB — CBC
HCT: 32.8 % — ABNORMAL LOW (ref 36.0–46.0)
Hemoglobin: 9.8 g/dL — ABNORMAL LOW (ref 12.0–15.0)
MCH: 30.2 pg (ref 26.0–34.0)
MCHC: 29.9 g/dL — ABNORMAL LOW (ref 30.0–36.0)
MCV: 101.2 fL — ABNORMAL HIGH (ref 80.0–100.0)
Platelets: 184 10*3/uL (ref 150–400)
RBC: 3.24 MIL/uL — ABNORMAL LOW (ref 3.87–5.11)
RDW: 14.6 % (ref 11.5–15.5)
WBC: 32.9 10*3/uL — ABNORMAL HIGH (ref 4.0–10.5)
nRBC: 0 % (ref 0.0–0.2)

## 2019-05-31 LAB — SARS CORONAVIRUS 2 (TAT 6-24 HRS): SARS Coronavirus 2: NEGATIVE

## 2019-05-31 LAB — CREATININE, SERUM
Creatinine, Ser: 1.29 mg/dL — ABNORMAL HIGH (ref 0.44–1.00)
GFR calc Af Amer: 46 mL/min — ABNORMAL LOW (ref >60)
GFR calc non Af Amer: 39 mL/min — ABNORMAL LOW (ref >60)

## 2019-05-31 MED ORDER — MECLIZINE HCL 12.5 MG PO TABS: 25.0000 mg | ORAL_TABLET | Freq: Three times a day (TID) | ORAL | Status: DC | PRN

## 2019-05-31 MED ORDER — ATORVASTATIN CALCIUM 40 MG PO TABS
40.0000 mg | ORAL_TABLET | Freq: Every day | ORAL | Status: DC
  Administered 2019-05-31 – 2019-06-04 (×5): 40 mg via ORAL
  Filled 2019-05-31 (×5): qty 1

## 2019-05-31 MED ORDER — FENTANYL CITRATE (PF) 100 MCG/2ML IJ SOLN
50.0000 ug | Freq: Once | INTRAMUSCULAR | Status: AC
  Administered 2019-05-31: 50 ug via INTRAVENOUS
  Filled 2019-05-31: qty 2

## 2019-05-31 MED ORDER — SODIUM CHLORIDE 0.9 % IV SOLN
INTRAVENOUS | Status: DC
  Administered 2019-05-31 – 2019-06-03 (×4): via INTRAVENOUS

## 2019-05-31 MED ORDER — METRONIDAZOLE IN NACL 5-0.79 MG/ML-% IV SOLN
500.0000 mg | Freq: Three times a day (TID) | INTRAVENOUS | Status: DC
  Administered 2019-05-31 – 2019-06-05 (×16): 500 mg via INTRAVENOUS
  Filled 2019-05-31 (×16): qty 100

## 2019-05-31 MED ORDER — SODIUM CHLORIDE 0.9 % IV SOLN
2.0000 g | INTRAVENOUS | Status: DC
  Administered 2019-06-01 – 2019-06-05 (×5): 2 g via INTRAVENOUS
  Filled 2019-05-31 (×5): qty 20

## 2019-05-31 MED ORDER — AMLODIPINE BESYLATE 5 MG PO TABS
10.0000 mg | ORAL_TABLET | Freq: Every morning | ORAL | Status: DC
  Administered 2019-05-31 – 2019-06-05 (×6): 10 mg via ORAL
  Filled 2019-05-31 (×6): qty 2

## 2019-05-31 MED ORDER — SODIUM CHLORIDE 0.9 % IV BOLUS (SEPSIS)
1000.0000 mL | Freq: Once | INTRAVENOUS | Status: AC
  Administered 2019-05-31: 1000 mL via INTRAVENOUS

## 2019-05-31 MED ORDER — DULOXETINE HCL 60 MG PO CPEP
60.0000 mg | ORAL_CAPSULE | Freq: Every morning | ORAL | Status: DC
  Administered 2019-05-31 – 2019-06-05 (×6): 60 mg via ORAL
  Filled 2019-05-31: qty 2
  Filled 2019-05-31 (×5): qty 1

## 2019-05-31 MED ORDER — LEVOTHYROXINE SODIUM 50 MCG PO TABS
50.0000 ug | ORAL_TABLET | Freq: Every day | ORAL | Status: DC
  Administered 2019-06-01 – 2019-06-05 (×5): 50 ug via ORAL
  Filled 2019-05-31 (×5): qty 1

## 2019-05-31 MED ORDER — SODIUM CHLORIDE 0.9 % IV SOLN
1.0000 g | Freq: Once | INTRAVENOUS | Status: AC
  Administered 2019-05-31: 03:00:00 1 g via INTRAVENOUS
  Filled 2019-05-31: qty 10

## 2019-05-31 MED ORDER — ACETAMINOPHEN 650 MG RE SUPP: 650.0000 mg | Freq: Four times a day (QID) | RECTAL | Status: DC | PRN

## 2019-05-31 MED ORDER — ONDANSETRON HCL 4 MG PO TABS: 4.0000 mg | ORAL_TABLET | Freq: Four times a day (QID) | ORAL | Status: DC | PRN

## 2019-05-31 MED ORDER — PANTOPRAZOLE SODIUM 40 MG PO TBEC
40.0000 mg | DELAYED_RELEASE_TABLET | Freq: Every day | ORAL | Status: DC
  Administered 2019-05-31 – 2019-06-05 (×6): 40 mg via ORAL
  Filled 2019-05-31 (×6): qty 1

## 2019-05-31 MED ORDER — IOHEXOL 300 MG/ML  SOLN
75.0000 mL | Freq: Once | INTRAMUSCULAR | Status: AC | PRN
  Administered 2019-05-31: 75 mL via INTRAVENOUS

## 2019-05-31 MED ORDER — RALOXIFENE HCL 60 MG PO TABS
60.0000 mg | ORAL_TABLET | Freq: Every morning | ORAL | Status: DC
  Administered 2019-06-01 – 2019-06-05 (×5): 60 mg via ORAL
  Filled 2019-05-31 (×6): qty 1

## 2019-05-31 MED ORDER — METRONIDAZOLE IN NACL 5-0.79 MG/ML-% IV SOLN
500.0000 mg | Freq: Once | INTRAVENOUS | Status: AC
  Administered 2019-05-31: 500 mg via INTRAVENOUS
  Filled 2019-05-31: qty 100

## 2019-05-31 MED ORDER — ENOXAPARIN SODIUM 40 MG/0.4ML ~~LOC~~ SOLN
40.0000 mg | SUBCUTANEOUS | Status: DC
  Administered 2019-05-31 – 2019-06-03 (×4): 40 mg via SUBCUTANEOUS
  Filled 2019-05-31 (×4): qty 0.4

## 2019-05-31 MED ORDER — ONDANSETRON HCL 4 MG/2ML IJ SOLN
4.0000 mg | Freq: Four times a day (QID) | INTRAMUSCULAR | Status: DC | PRN
  Administered 2019-06-03: 4 mg via INTRAVENOUS
  Filled 2019-05-31: qty 2

## 2019-05-31 MED ORDER — LORATADINE 10 MG PO TABS
10.0000 mg | ORAL_TABLET | Freq: Every day | ORAL | Status: DC
  Administered 2019-05-31 – 2019-06-05 (×6): 10 mg via ORAL
  Filled 2019-05-31 (×6): qty 1

## 2019-05-31 MED ORDER — SODIUM CHLORIDE 0.9 % IV SOLN: 1.0000 g | INTRAVENOUS | Status: DC

## 2019-05-31 MED ORDER — ACETAMINOPHEN 325 MG PO TABS
650.0000 mg | ORAL_TABLET | Freq: Four times a day (QID) | ORAL | Status: DC | PRN
  Administered 2019-05-31 – 2019-06-03 (×4): 650 mg via ORAL
  Filled 2019-05-31 (×6): qty 2

## 2019-05-31 MED ORDER — ASPIRIN EC 81 MG PO TBEC
81.0000 mg | DELAYED_RELEASE_TABLET | Freq: Every morning | ORAL | Status: DC
  Administered 2019-05-31 – 2019-06-05 (×6): 81 mg via ORAL
  Filled 2019-05-31 (×6): qty 1

## 2019-05-31 NOTE — ED Provider Notes (Signed)
[facility] EMERGENCY DEPARTMENT Provider Note   CSN: [id number] Arrival date & time: 05/30/19  2221     History   Chief Complaint Chief Complaint  Patient presents with   Abdominal Pain    HPI Tamara Stein is a 78 y.o. female.     The history is provided by the patient.  Abdominal Pain Pain location:  Generalized Pain quality: cramping   Pain radiates to:  Does not radiate Pain severity:  Moderate Onset quality:  Gradual Timing:  Intermittent Progression:  Worsening Chronicity:  New Relieved by:  None tried Worsened by:  Nothing Associated symptoms: diarrhea, nausea and vomiting   Associated symptoms: no cough, no fever and no hematochezia   Patient history of chronic kidney disease, cerebellar degeneration, anxiety presents abdominal cramping vomiting and diarrhea.  This started approximately 4 hours ago. Stool is nonbloody. No other acute complaints.  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[place])    Chronic kidney disease    Chronic knee pain    Depression    GERD (gastroesophageal reflux disease)    Hyperlipidemia    Hypertension    Thyroid disease     Patient Active Problem List   Diagnosis Date Noted   Nausea vomiting and diarrhea    Lesion of spleen    Diverticulitis 05/04/2019   Internal impingement of left shoulder 12/24/2017   Anxiety 04/20/2017   Cerebellar dysfunction 04/20/2017   Incontinence of urine 04/20/2017   UTI (urinary tract infection) 07/15/2016   Abdominal pain 07/15/2016   Colitis 07/15/2016   Syncope and collapse 04/26/2016   Lower urinary tract infectious disease 04/26/2016   Fall    Daytime somnolence 03/10/2016   Diplopia 03/10/2016   Insomnia 03/10/2016   Sleep apnea in adult 03/10/2016   Imbalance 03/07/2016   Migraine 03/07/2016   Tremor 03/07/2016   Pain of both shoulder joints 02/29/2016   Arthritis 09/24/2015     Constipation 06/05/2015   Depression 06/05/2015   Hypothyroid 06/05/2015   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      Home Medications    Prior to Admission medications   Medication Sig Start Date End Date Taking? Authorizing Provider  amLODipine (NORVASC) 10 MG tablet TAKE 1 TABLET DAILY FOR    BLOOD PRESSURE Patient taking differently: Take 10 mg by mouth every morning.  04/26/19   Janora Norlander, DO  amoxicillin-clavulanate (AUGMENTIN) 875-125 MG tablet Take 1 tablet by mouth every 12 (twelve) hours. 05/07/19   Orson Eva, MD  aspirin EC 81 MG tablet Take 81 mg by mouth every morning.     [provider]  atorvastatin (LIPITOR) 40 MG tablet Take 1 tablet (40 mg total) by mouth at bedtime. Do not restart until instructed by MD 05/14/19   Orson Eva, MD  Bacillus Coagulans-Inulin (ALIGN PREBIOTIC-PROBIOTIC) 5-1.25 MG-GM CHEW Chew 1 tablet by mouth daily. 11/14/18   Isla Pence, MD  Calcium Carbonate-Vitamin D (CALTRATE 600+D PO) Take 1 tablet by mouth daily.    [provider]  cetirizine (ZYRTEC) 10 MG chewable tablet Chew 10 mg by mouth daily.    [provider]  diclofenac (VOLTAREN) 50 MG EC tablet TAKE 1 TABLET BY MOUTH (50MG TOTAL) THREE TIMES DAILY Patient taking differently: Take 50-100 mg by mouth See admin instructions. 100MG IN THE MORNING AND 50MG AT BEDTIME 04/27/19   Ronnie Doss M, DO  DULoxetine (CYMBALTA) 60 MG capsule Take 1 capsule (60 mg total) by mouth daily. Patient taking differently: Take 60  mg by mouth every morning.  04/27/19   Janora Norlander, DO  furosemide (LASIX) 20 MG tablet Take 1 tablet (20 mg total) by mouth daily as needed. As needed for swelling 08/25/17   Eustaquio Maize, MD  levothyroxine (SYNTHROID) 50 MCG tablet TAKE 1 TABLET ONCE DAILY Patient taking differently: Take 50 mcg by mouth daily before breakfast.  04/26/19   Janora Norlander, DO  meclizine (ANTIVERT) 25 MG tablet Take 25 mg by mouth 3 (three) times daily as needed for dizziness.    [provider]  Multiple Vitamin (MULTIVITAMIN WITH MINERALS) TABS tablet Take 1 tablet by mouth daily.    [provider]  omeprazole (PRILOSEC) 40 MG capsule TAKE 1 CAPSULE DAILY Patient taking differently: Take 40 mg by mouth daily before breakfast.  04/26/19   Ronnie Doss M, DO  ondansetron (ZOFRAN ODT) 4 MG disintegrating tablet Take 1 tablet (4 mg total) by mouth every 8 (eight) hours as needed. 11/14/18   Isla Pence, MD  raloxifene (EVISTA) 60 MG tablet Take 1 tablet (60 mg total) by mouth daily. Patient taking differently: Take 60 mg by mouth every morning.  04/27/19   Janora Norlander, DO  atorvastatin (LIPITOR) 40 MG tablet Take 1 tablet (40 mg total) by mouth at bedtime. 11/05/18   Janora Norlander, DO  DULoxetine (CYMBALTA) 60 MG capsule Take 1 capsule (60 mg total) by mouth daily. 11/05/18   Janora Norlander, DO    Family History Family History  Problem Relation Age of Onset   Arthritis Sister    Hyperlipidemia Sister    Hypertension Sister    Cancer Brother    Diabetes Brother    Heart disease Brother     Social History Social History   Tobacco Use   Smoking status: Never Smoker   Smokeless tobacco: Never Used  Substance Use Topics   Alcohol use: No   Drug use: No     Allergies   Sulfa antibiotics, Codeine, Doxycycline, Erythromycin, Ibuprofen, Levaquin [levofloxacin], and Penicillins   Review of Systems Review of Systems  Constitutional:  Negative for fever.  Respiratory: Negative for cough.   Gastrointestinal: Positive for abdominal pain, diarrhea, nausea and vomiting. Negative for hematochezia.  All other systems reviewed and are negative.    Physical Exam Updated Vital Signs BP 128/77    Pulse (!) 102    Temp 97.7 F (36.5 C) (Oral)    Resp (!) 23    SpO2 97%   Physical Exam CONSTITUTIONAL: Elderly, anxious HEAD: Normocephalic/atraumatic EYES: EOMI, no scleral icterus ENMT: Mask in place NECK: supple no meningeal signs SPINE/BACK:entire spine nontender CV: S1/S2 noted, no murmurs/rubs/gallops noted LUNGS: Lungs are clear to auscultation bilaterally, no apparent distress ABDOMEN: soft, mild diffuse tenderness, no rebound or guarding, bowel sounds noted throughout abdomen GU:no cva tenderness NEURO: Pt is awake/alert/appropriate, moves all extremitiesx4.  No facial droop.   EXTREMITIES:  pulses normal/equal, full ROM SKIN: warm, color normal PSYCH: Anxious   ED Treatments / Results  Labs (all labs ordered are listed, but only abnormal results are displayed) Labs Reviewed  COMPREHENSIVE METABOLIC PANEL - Abnormal; Notable for the following components:      Result Value   Chloride 112 (*)    CO2 21 (*)    Glucose, Bld 153 (*)    BUN 39 (*)    Creatinine, Ser 1.42 (*)    Calcium 8.7 (*)    Total Protein 6.3 (*)    GFR calc non Af Amer 35 (*)    GFR calc Af Amer 41 (*)    All other components within normal limits  CBC - Abnormal; Notable for the following components:   WBC 14.4 (*)    RBC 3.86 (*)    Hemoglobin 11.8 (*)    MCV 100.8 (*)    All other components within normal limits  URINALYSIS, ROUTINE W REFLEX MICROSCOPIC - Abnormal; Notable for the following components:   Color, Urine AMBER (*)    APPearance HAZY (*)    Protein, ur 30 (*)    Nitrite POSITIVE (*)    Leukocytes,Ua SMALL (*)    Bacteria, UA RARE (*)    All other components within normal limits  SARS CORONAVIRUS 2 (TAT 6-24 HRS)  C  DIFFICILE QUICK SCREEN W PCR REFLEX  LIPASE, BLOOD    EKG EKG Interpretation  Date/Time:  Monday May 30 2019 23:55:14 EDT Ventricular Rate:  99 PR Interval:    QRS Duration: 92 QT Interval:  338 QTC Calculation: 434 R Axis:   60 Text Interpretation:  Sinus rhythm Borderline prolonged PR interval Low voltage, precordial leads Borderline T wave abnormalities Interpretation limited secondary to artifact Confirmed by Ripley Fraise [id number]) on 05/31/2019 12:13:34 AM   Radiology Ct Abdomen Pelvis W Contrast  Result Date: 05/31/2019 CLINICAL DATA:  Abdominal pain for 4 hours EXAM: CT ABDOMEN AND PELVIS WITH CONTRAST TECHNIQUE: Multidetector CT imaging of the abdomen and pelvis was performed using the standard protocol following bolus administration of intravenous contrast. CONTRAST:  62m OMNIPAQUE IOHEXOL 300 MG/ML  SOLN COMPARISON:  MRI May 27, 2019, CT May 04, 2019 FINDINGS: Lower chest: There is a large paraesophageal hernia present. There is mild cardiomegaly. The visualized portions of the lungs are clear. Hepatobiliary: The liver is normal in density without focal abnormality.The main portal vein is patent. The patient is status post cholecystectomy. No biliary ductal dilation. Again noted is mild dilatation of the common bile duct. Pancreas: Unremarkable. No pancreatic ductal dilatation or surrounding inflammatory changes. Spleen: Normal in size. There is an unchanged 3 cm low-density lesion seen within the splenic dome. Adrenals/Urinary Tract: Both adrenal glands appear normal. Low-density lesions seen within both kidneys, shown to be renal cysts on recent MRI. There is also a tiny fat containing lesion in the upper pole the left kidney, consistent with angiomyolipoma Stomach/Bowel: The small bowel is unremarkable. There is a focal segment of colon within the splenic flexure and descending colon that appears to have diffuse bowel wall thickening with surrounding mesenteric  fat stranding changesscattered colonic diverticula are noted within the descending colon. There is a moderate amount of colonic stool. No pericolonic free fluid or Vascular/Lymphatic: There are no enlarged mesenteric, retroperitoneal, or pelvic lymph nodes. Scattered aortic atherosclerotic calcifications are seen without aneurysmal dilatation. Reproductive: Again noted within the left adnexa a 4 cm low-density lesion which appears to be present and stable since April 2020. The  lesion was present dating back to 2017, however minimally enlarged since that exam. Other: A small fat containing anterior umbilical hernia seen. Musculoskeletal: No acute or significant osseous findings. Chronic superior compression deformity of the L4 vertebral bodies seen with 50% loss in vertebral body height. IMPRESSION: 1. 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 No pericolonic abscess or free air. 2. 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 3. Large paraesophageal hernia. 4. Stable 4 cm left adnexal cyst. 5.  Aortic Atherosclerosis (ICD10-I70.0). Electronically Signed   By: Prudencio Pair M.D.   On: 05/31/2019 02:08    Procedures Procedures   Medications Ordered in ED Medications  sodium chloride flush (NS) 0.9 % injection 3 mL (has no administration in time range)  cefTRIAXone (ROCEPHIN) 1 g in sodium chloride 0.9 % 100 mL IVPB (has no administration in time range)    And  metroNIDAZOLE (FLAGYL) IVPB 500 mg (has no administration in time range)  sodium chloride 0.9 % bolus 1,000 mL (0 mLs Intravenous Stopped 05/31/19 0148)  sodium chloride 0.9 % bolus 1,000 mL (1,000 mLs Intravenous New Bag/Given 05/31/19 0210)  fentaNYL (SUBLIMAZE) injection 50 mcg (50 mcg Intravenous Given 05/31/19 0148)  iohexol (OMNIPAQUE) 300 MG/ML solution 75 mL (75 mLs Intravenous Contrast Given 05/31/19 0125)     Initial Impression / Assessment and Plan / ED Course  I have  reviewed the triage vital signs and the nursing notes.  Pertinent labs results that were available during my care of the patient were reviewed by me and considered in my medical decision making (see chart for details).       3:05 AM Patient presented with abdominal pain vomiting diarrhea.  She had persistent abdominal tenderness along with tachycardia and elevated white count.  A CT scan was ordered that revealed descending colitis She has continued tachycardia.  Blood pressures range in the upper 90s to low 100s.  At this point admission with IV fluids and IV antibiotics is warranted.  Patient agreed with plan. Patient asked me to contact her brother-in-law Audry Pili which I did by phone.  Discussed with Dr. Darrick Meigs for admission  Final Clinical Impressions(s) / ED Diagnoses   Final diagnoses:  Colitis  Dehydration    ED Discharge Orders    None       Ripley Fraise, MD 05/31/19 [phone]

## 2019-05-31 NOTE — H&P (Signed)
TRH H&P    Patient Demographics:    Tamara Stein, is a 78 y.o. female  MRN: [id number]  DOB - Apr 25, 1941  Admit Date - 05/30/2019  Referring MD/NP/PA: Ripley Fraise  Outpatient Primary MD for the patient is Janora Norlander, DO  Patient coming from: Home  Chief complaint-abdominal pain   HPI:    Tamara Stein  is a 78 y.o. female, with history of spinal cerebellar degeneration, hypertension, CKD stage III, anxiety, hypothyroidism, hyperlipidemia came to the hospital with abdominal pain and diarrhea.  Patient had similar presentation in September at that time she was diagnosed with acute diverticulitis, was treated with IV Zosyn and discharged on Augmentin. Today patient came to the ED with complaints of abdominal pain with diarrhea, patient had 2 large BMs in the ED.  CT scan of the abdomen showed findings suggestive of splenic flexure and proximal descending colitis.  No pericolonic abscess or free air. Patient started on ceftriaxone and Flagyl. She denies chest pain or shortness of breath. Denies fever or chills. Denies dysuria    Review of systems:    In addition to the HPI above,    All other systems reviewed and are negative.    Past History of the following :    Past Medical History:  Diagnosis Date  . Allergy   . Anxiety   . Cataract   . Cerebellar degeneration ([place])   . Chronic kidney disease   . Chronic knee pain   . Depression   . GERD (gastroesophageal reflux disease)   . Hyperlipidemia   . Hypertension   . Thyroid disease       Past Surgical History:  Procedure Laterality Date  . ABDOMINAL HYSTERECTOMY    . CHOLECYSTECTOMY    . MOUTH SURGERY    . RIGHT ELBOW        Social History:      Social History   Tobacco Use  . Smoking status: Never Smoker  . Smokeless tobacco: Never Used  Substance Use Topics  . Alcohol use: No       Family History :      Family History  Problem Relation Age of Onset  . Arthritis Sister   . Hyperlipidemia Sister   . Hypertension Sister   . Cancer Brother   . Diabetes Brother   . Heart disease Brother       Home Medications:   Prior to Admission medications   Medication Sig Start Date End Date Taking? Authorizing Provider  amLODipine (NORVASC) 10 MG tablet TAKE 1 TABLET DAILY FOR    BLOOD PRESSURE Patient taking differently: Take 10 mg by mouth every morning.  04/26/19   Janora Norlander, DO  amoxicillin-clavulanate (AUGMENTIN) 875-125 MG tablet Take 1 tablet by mouth every 12 (twelve) hours. 05/07/19   Orson Eva, MD  aspirin EC 81 MG tablet Take 81 mg by mouth every morning.     [provider]  atorvastatin (LIPITOR) 40 MG tablet Take 1 tablet (40 mg total) by mouth at bedtime. Do not restart until instructed  by MD 05/14/19   Orson Eva, MD  Bacillus Coagulans-Inulin (ALIGN PREBIOTIC-PROBIOTIC) 5-1.25 MG-GM CHEW Chew 1 tablet by mouth daily. 11/14/18   Isla Pence, MD  Calcium Carbonate-Vitamin D (CALTRATE 600+D PO) Take 1 tablet by mouth daily.    [provider]  cetirizine (ZYRTEC) 10 MG chewable tablet Chew 10 mg by mouth daily.    [provider]  diclofenac (VOLTAREN) 50 MG EC tablet TAKE 1 TABLET BY MOUTH (50MG TOTAL) THREE TIMES DAILY Patient taking differently: Take 50-100 mg by mouth See admin instructions. 100MG IN THE MORNING AND 50MG AT BEDTIME 04/27/19   Ronnie Doss M, DO  DULoxetine (CYMBALTA) 60 MG capsule Take 1 capsule (60 mg total) by mouth daily. Patient taking differently: Take 60 mg by mouth every morning.  04/27/19   Janora Norlander, DO  furosemide (LASIX) 20 MG tablet Take 1 tablet (20 mg total) by mouth daily as needed. As needed for swelling 08/25/17   Eustaquio Maize, MD  levothyroxine (SYNTHROID) 50 MCG tablet TAKE 1 TABLET ONCE DAILY Patient taking differently: Take 50 mcg by mouth daily before breakfast.  04/26/19   Janora Norlander, DO  meclizine (ANTIVERT) 25 MG tablet Take 25 mg by mouth 3 (three) times daily as needed for dizziness.    [provider]  Multiple Vitamin (MULTIVITAMIN WITH MINERALS) TABS tablet Take 1 tablet by mouth daily.    [provider]  omeprazole (PRILOSEC) 40 MG capsule TAKE 1 CAPSULE DAILY Patient taking differently: Take 40 mg by mouth daily before breakfast.  04/26/19   Ronnie Doss M, DO  ondansetron (ZOFRAN ODT) 4 MG disintegrating tablet Take 1 tablet (4 mg total) by mouth every 8 (eight) hours as needed. 11/14/18   Isla Pence, MD  raloxifene (EVISTA) 60 MG tablet Take 1 tablet (60 mg total) by mouth daily. Patient taking differently: Take 60 mg by mouth every morning.  04/27/19   Janora Norlander, DO  atorvastatin (LIPITOR) 40 MG tablet Take 1 tablet (40 mg total) by mouth at bedtime. 11/05/18   Janora Norlander, DO  DULoxetine (CYMBALTA) 60 MG capsule Take 1 capsule (60 mg total) by mouth daily. 11/05/18   Janora Norlander, DO     Allergies:     Allergies  Allergen Reactions  . Sulfa Antibiotics Anaphylaxis    Facial swelling   . Codeine Other (See Comments)    BLACK OUTS  . Doxycycline Nausea Only  . Erythromycin Swelling  . Ibuprofen Other (See Comments)    Stomach pain, muscle spasm, GI bleeding  . Levaquin [Levofloxacin] Other (See Comments)    Insomnia/trouble breathing  . Penicillins Diarrhea    Has patient had a PCN reaction causing immediate rash, facial/tongue/throat swelling, SOB or lightheadedness with hypotension: No Has patient had a PCN reaction causing severe rash involving mucus membranes or skin necrosis: No Has patient had a PCN reaction that required hospitalization Unknown Has patient had a PCN reaction occurring within the last 10 years: No If all of the above answers are "NO", then may proceed with Cephalosporin use.      Physical Exam:   Vitals  Blood pressure 99/63, pulse (!) 110, temperature 97.7 F (36.5  C), temperature source Oral, resp. rate (!) 22, SpO2 95 %.  1.  General: Appears in no acute distress  2. Psychiatric: Alert, oriented x3, intact insight and judgment  3. Neurologic: Cranial nerves II to XII grossly intact, motor strength 5/5 in all extremities  4. HEENMT:  Atraumatic normocephalic, extraocular muscles are intact  5. Respiratory : Clear to auscultation bilaterally  6. Cardiovascular : S1-S2, regular  7. Gastrointestinal:  Abdominal soft, mild generalized tenderness to palpation      Data Review:    CBC Recent Labs  Lab 05/30/19 2305  WBC 14.4*  HGB 11.8*  HCT 38.9  PLT 222  MCV 100.8*  MCH 30.6  MCHC 30.3  RDW 14.5   ------------------------------------------------------------------------------------------------------------------  Results for orders placed or performed during the hospital encounter of 05/30/19 (from the past 48 hour(s))  Lipase, blood     Status: None   Collection Time: 05/30/19 11:05 PM  Result Value Ref Range   Lipase 31 11 - 51 U/L    Comment: Performed at [facility], [address] [place], [place] [address]  Comprehensive metabolic panel     Status: Abnormal   Collection Time: 05/30/19 11:05 PM  Result Value Ref Range   Sodium 143 135 - 145 mmol/L   Potassium 4.0 3.5 - 5.1 mmol/L   Chloride 112 (H) 98 - 111 mmol/L   CO2 21 (L) 22 - 32 mmol/L   Glucose, Bld 153 (H) 70 - 99 mg/dL   BUN 39 (H) 8 - 23 mg/dL   Creatinine, Ser 1.42 (H) 0.44 - 1.00 mg/dL   Calcium 8.7 (L) 8.9 - 10.3 mg/dL   Total Protein 6.3 (L) 6.5 - 8.1 g/dL   Albumin 3.6 3.5 - 5.0 g/dL   AST 24 15 - 41 U/L   ALT 37 0 - 44 U/L   Alkaline Phosphatase 84 38 - 126 U/L   Total Bilirubin 0.3 0.3 - 1.2 mg/dL   GFR calc non Af Amer 35 (L) >60 mL/min   GFR calc Af Amer 41 (L) >60 mL/min   Anion gap 10 5 - 15    Comment: Performed at [facility], [address], [place], [place] [address]  CBC     Status: Abnormal   Collection Time: 05/30/19 11:05 PM   Result Value Ref Range   WBC 14.4 (H) 4.0 - 10.5 K/uL   RBC 3.86 (L) 3.87 - 5.11 MIL/uL   Hemoglobin 11.8 (L) 12.0 - 15.0 g/dL   HCT 38.9 36.0 - 46.0 %   MCV 100.8 (H) 80.0 - 100.0 fL   MCH 30.6 26.0 - 34.0 pg   MCHC 30.3 30.0 - 36.0 g/dL   RDW 14.5 11.5 - 15.5 %   Platelets 222 150 - 400 K/uL   nRBC 0.0 0.0 - 0.2 %    Comment: Performed at [facility], [address], [place], [place] [address]  Urinalysis, Routine w reflex microscopic     Status: Abnormal   Collection Time: 05/31/19  2:11 AM  Result Value Ref Range   Color, Urine AMBER (A) YELLOW    Comment: BIOCHEMICALS MAY BE AFFECTED BY COLOR   APPearance HAZY (A) CLEAR   Specific Gravity, Urine 1.023 1.005 - 1.030   pH 5.0 5.0 - 8.0   Glucose, UA NEGATIVE NEGATIVE mg/dL   Hgb urine dipstick NEGATIVE NEGATIVE   Bilirubin Urine NEGATIVE NEGATIVE   Ketones, ur NEGATIVE NEGATIVE mg/dL   Protein, ur 30 (A) NEGATIVE mg/dL   Nitrite POSITIVE (A) NEGATIVE   Leukocytes,Ua SMALL (A) NEGATIVE   RBC / HPF 0-5 0 - 5 RBC/hpf   WBC, UA 6-10 0 - 5 WBC/hpf   Bacteria, UA RARE (A) NONE SEEN   Squamous Epithelial / LPF 0-5 0 - 5   Mucus PRESENT  Hyaline Casts, UA PRESENT     Comment: Performed at [facility], [address] [place], [place] [address]    Chemistries  Recent Labs  Lab 05/30/19 2305  NA 143  K 4.0  CL 112*  CO2 21*  GLUCOSE 153*  BUN 39*  CREATININE 1.42*  CALCIUM 8.7*  AST 24  ALT 37  ALKPHOS 84  BILITOT 0.3   ------------------------------------------------------------------------------------------------------------------  ------------------------------------------------------------------------------------------------------------------ GFR: CrCl cannot be calculated (Unknown ideal weight.). Liver Function Tests: Recent Labs  Lab 05/30/19 2305  AST 24  ALT 37  ALKPHOS 84  BILITOT 0.3  PROT 6.3*  ALBUMIN 3.6   Recent Labs  Lab 05/30/19 2305  LIPASE 31     --------------------------------------------------------------------------------------------------------------- Urine analysis:    Component Value Date/Time   COLORURINE AMBER (A) 05/31/2019 0211   APPEARANCEUR HAZY (A) 05/31/2019 0211   APPEARANCEUR Cloudy (A) 01/25/2019 1425   LABSPEC 1.023 05/31/2019 0211   PHURINE 5.0 05/31/2019 0211   GLUCOSEU NEGATIVE 05/31/2019 0211   HGBUR NEGATIVE 05/31/2019 0211   BILIRUBINUR NEGATIVE 05/31/2019 0211   BILIRUBINUR Negative 01/25/2019 1425   KETONESUR NEGATIVE 05/31/2019 0211   PROTEINUR 30 (A) 05/31/2019 0211   UROBILINOGEN negative 09/06/2015 1155   UROBILINOGEN 0.2 03/25/2014 1359   NITRITE POSITIVE (A) 05/31/2019 0211   LEUKOCYTESUR SMALL (A) 05/31/2019 0211      Imaging Results:    Ct Abdomen Pelvis W Contrast  Result Date: 05/31/2019 CLINICAL DATA:  Abdominal pain for 4 hours EXAM: CT ABDOMEN AND PELVIS WITH CONTRAST TECHNIQUE: Multidetector CT imaging of the abdomen and pelvis was performed using the standard protocol following bolus administration of intravenous contrast. CONTRAST:  23m OMNIPAQUE IOHEXOL 300 MG/ML  SOLN COMPARISON:  MRI May 27, 2019, CT May 04, 2019 FINDINGS: Lower chest: There is a large paraesophageal hernia present. There is mild cardiomegaly. The visualized portions of the lungs are clear. Hepatobiliary: The liver is normal in density without focal abnormality.The main portal vein is patent. The patient is status post cholecystectomy. No biliary ductal dilation. Again noted is mild dilatation of the common bile duct. Pancreas: Unremarkable. No pancreatic ductal dilatation or surrounding inflammatory changes. Spleen: Normal in size. There is an unchanged 3 cm low-density lesion seen within the splenic dome. Adrenals/Urinary Tract: Both adrenal glands appear normal. Low-density lesions seen within both kidneys, shown to be renal cysts on recent MRI. There is also a tiny fat containing lesion in the upper pole  the left kidney, consistent with angiomyolipoma Stomach/Bowel: The small bowel is unremarkable. There is a focal segment of colon within the splenic flexure and descending colon that appears to have diffuse bowel wall thickening with surrounding mesenteric fat stranding changesscattered colonic diverticula are noted within the descending colon. There is a moderate amount of colonic stool. No pericolonic free fluid or Vascular/Lymphatic: There are no enlarged mesenteric, retroperitoneal, or pelvic lymph nodes. Scattered aortic atherosclerotic calcifications are seen without aneurysmal dilatation. Reproductive: Again noted within the left adnexa a 4 cm low-density lesion which appears to be present and stable since April 2020. The lesion was present dating back to 2017, however minimally enlarged since that exam. Other: A small fat containing anterior umbilical hernia seen. Musculoskeletal: No acute or significant osseous findings. Chronic superior compression deformity of the L4 vertebral bodies seen with 50% loss in vertebral body height. IMPRESSION: 1. 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 No pericolonic abscess or free air. 2. 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 3. Large paraesophageal  hernia. 4. Stable 4 cm left adnexal cyst. 5.  Aortic Atherosclerosis (ICD10-I70.0). Electronically Signed   By: Prudencio Pair M.D.   On: 05/31/2019 02:08    My personal review of EKG: Rhythm NSR, nonspecific ST changes.   Assessment & Plan:    Active Problems:   Colitis   1. Colitis-CT scan shows  descending colitis started on ceftriaxone and Flagyl.  Continue with antibiotics.  Stool for C. difficile has been ordered.  2. Hypertension-continue amlodipine.  3. CKD stage III-Baseline creatinine is 1.1-1.4.  Today creatinine is 1.42, at baseline.  4. History of CVA/cerebral degeneration-stable, continue aspirin.  5. Hypothyroidism-continue  Synthroid.  6. Anxiety/depression-continue Cymbalta    DVT Prophylaxis-   Lovenox   AM Labs Ordered, also please review Full Orders  Family Communication: Admission, patients condition and plan of care including tests being ordered have been discussed with the patient  who indicate understanding and agree with the plan and Code Status.  Code Status: DNR  Admission status: Observation: Based on patients clinical presentation and evaluation of above clinical data, I have made determination that patient meets Inpatient criteria at this time.  Time spent in minutes : 60 minutes    Oswald Hillock M.D on 05/31/2019 at 5:13 AM

## 2019-05-31 NOTE — Progress Notes (Signed)
Patient seen and examined.  Admitted after midnight secondary to abdominal pain and diarrhea.  No fever hemodynamically stable at this time; reports some nausea but not vomiting, abd pain better after analgesics; still with ongoing loose stools. Imaging studies demonstrated splenic flexure proximal descending colitis. Please refer to H&P written by Dr. Darrick Meigs for further info/details of admission.  Plan: -advance diet to full liquid -follow stool cultures -continue IVF's and supportive care -continue current antibiotics.  -Follow electrolytes and replete them as needed.  Barton Dubois MD [phone]

## 2019-05-31 NOTE — ED Notes (Signed)
Spoke with Mariel Sleet, pt's POA and informed him pt is being admitted

## 2019-06-01 DIAGNOSIS — K5700 Diverticulitis of small intestine with perforation and abscess without bleeding: Secondary | ICD-10-CM | POA: Diagnosis not present

## 2019-06-01 DIAGNOSIS — K529 Noninfective gastroenteritis and colitis, unspecified: Secondary | ICD-10-CM | POA: Diagnosis not present

## 2019-06-01 DIAGNOSIS — D649 Anemia, unspecified: Secondary | ICD-10-CM

## 2019-06-01 DIAGNOSIS — K838 Other specified diseases of biliary tract: Secondary | ICD-10-CM | POA: Diagnosis present

## 2019-06-01 DIAGNOSIS — M1990 Unspecified osteoarthritis, unspecified site: Secondary | ICD-10-CM | POA: Diagnosis present

## 2019-06-01 DIAGNOSIS — N183 Chronic kidney disease, stage 3 unspecified: Secondary | ICD-10-CM | POA: Diagnosis present

## 2019-06-01 DIAGNOSIS — G4730 Sleep apnea, unspecified: Secondary | ICD-10-CM | POA: Diagnosis present

## 2019-06-01 DIAGNOSIS — G319 Degenerative disease of nervous system, unspecified: Secondary | ICD-10-CM | POA: Diagnosis present

## 2019-06-01 DIAGNOSIS — B9620 Unspecified Escherichia coli [E. coli] as the cause of diseases classified elsewhere: Secondary | ICD-10-CM | POA: Diagnosis present

## 2019-06-01 DIAGNOSIS — R29898 Other symptoms and signs involving the musculoskeletal system: Secondary | ICD-10-CM | POA: Diagnosis not present

## 2019-06-01 DIAGNOSIS — Z66 Do not resuscitate: Secondary | ICD-10-CM | POA: Diagnosis present

## 2019-06-01 DIAGNOSIS — K862 Cyst of pancreas: Secondary | ICD-10-CM | POA: Diagnosis present

## 2019-06-01 DIAGNOSIS — Z7401 Bed confinement status: Secondary | ICD-10-CM | POA: Diagnosis not present

## 2019-06-01 DIAGNOSIS — Z20828 Contact with and (suspected) exposure to other viral communicable diseases: Secondary | ICD-10-CM | POA: Diagnosis present

## 2019-06-01 DIAGNOSIS — E785 Hyperlipidemia, unspecified: Secondary | ICD-10-CM | POA: Diagnosis present

## 2019-06-01 DIAGNOSIS — R945 Abnormal results of liver function studies: Secondary | ICD-10-CM | POA: Diagnosis not present

## 2019-06-01 DIAGNOSIS — K449 Diaphragmatic hernia without obstruction or gangrene: Secondary | ICD-10-CM | POA: Diagnosis present

## 2019-06-01 DIAGNOSIS — I959 Hypotension, unspecified: Secondary | ICD-10-CM | POA: Diagnosis present

## 2019-06-01 DIAGNOSIS — A09 Infectious gastroenteritis and colitis, unspecified: Secondary | ICD-10-CM | POA: Diagnosis not present

## 2019-06-01 DIAGNOSIS — E860 Dehydration: Secondary | ICD-10-CM | POA: Diagnosis present

## 2019-06-01 DIAGNOSIS — I129 Hypertensive chronic kidney disease with stage 1 through stage 4 chronic kidney disease, or unspecified chronic kidney disease: Secondary | ICD-10-CM | POA: Diagnosis present

## 2019-06-01 DIAGNOSIS — N390 Urinary tract infection, site not specified: Secondary | ICD-10-CM | POA: Diagnosis present

## 2019-06-01 DIAGNOSIS — R1084 Generalized abdominal pain: Secondary | ICD-10-CM | POA: Diagnosis not present

## 2019-06-01 DIAGNOSIS — D7389 Other diseases of spleen: Secondary | ICD-10-CM | POA: Diagnosis present

## 2019-06-01 DIAGNOSIS — E039 Hypothyroidism, unspecified: Secondary | ICD-10-CM | POA: Diagnosis present

## 2019-06-01 DIAGNOSIS — K5730 Diverticulosis of large intestine without perforation or abscess without bleeding: Secondary | ICD-10-CM | POA: Diagnosis present

## 2019-06-01 DIAGNOSIS — I700 Atherosclerosis of aorta: Secondary | ICD-10-CM | POA: Diagnosis present

## 2019-06-01 DIAGNOSIS — K644 Residual hemorrhoidal skin tags: Secondary | ICD-10-CM | POA: Diagnosis present

## 2019-06-01 DIAGNOSIS — K219 Gastro-esophageal reflux disease without esophagitis: Secondary | ICD-10-CM | POA: Diagnosis present

## 2019-06-01 DIAGNOSIS — R197 Diarrhea, unspecified: Secondary | ICD-10-CM | POA: Diagnosis not present

## 2019-06-01 LAB — COMPREHENSIVE METABOLIC PANEL
ALT: 530 U/L — ABNORMAL HIGH (ref 0–44)
AST: 313 U/L — ABNORMAL HIGH (ref 15–41)
Albumin: 2.7 g/dL — ABNORMAL LOW (ref 3.5–5.0)
Alkaline Phosphatase: 88 U/L (ref 38–126)
Anion gap: 6 (ref 5–15)
BUN: 33 mg/dL — ABNORMAL HIGH (ref 8–23)
CO2: 21 mmol/L — ABNORMAL LOW (ref 22–32)
Calcium: 7.6 mg/dL — ABNORMAL LOW (ref 8.9–10.3)
Chloride: 117 mmol/L — ABNORMAL HIGH (ref 98–111)
Creatinine, Ser: 1.26 mg/dL — ABNORMAL HIGH (ref 0.44–1.00)
GFR calc Af Amer: 47 mL/min — ABNORMAL LOW (ref >60)
GFR calc non Af Amer: 40 mL/min — ABNORMAL LOW (ref >60)
Glucose, Bld: 97 mg/dL (ref 70–99)
Potassium: 4.2 mmol/L (ref 3.5–5.1)
Sodium: 144 mmol/L (ref 135–145)
Total Bilirubin: 0.6 mg/dL (ref 0.3–1.2)
Total Protein: 5.1 g/dL — ABNORMAL LOW (ref 6.5–8.1)

## 2019-06-01 LAB — CBC
HCT: 28.5 % — ABNORMAL LOW (ref 36.0–46.0)
Hemoglobin: 8.6 g/dL — ABNORMAL LOW (ref 12.0–15.0)
MCH: 30.8 pg (ref 26.0–34.0)
MCHC: 30.2 g/dL (ref 30.0–36.0)
MCV: 102.2 fL — ABNORMAL HIGH (ref 80.0–100.0)
Platelets: 142 10*3/uL — ABNORMAL LOW (ref 150–400)
RBC: 2.79 MIL/uL — ABNORMAL LOW (ref 3.87–5.11)
RDW: 14.9 % (ref 11.5–15.5)
WBC: 22.7 10*3/uL — ABNORMAL HIGH (ref 4.0–10.5)
nRBC: 0 % (ref 0.0–0.2)

## 2019-06-01 LAB — OCCULT BLOOD X 1 CARD TO LAB, STOOL: Fecal Occult Bld: NEGATIVE

## 2019-06-01 NOTE — Consult Note (Addendum)
Referring Provider: No ref. provider found Primary Care Physician:  Janora Norlander, DO Primary Gastroenterologist: Dr. Mathews Robinsons   Reason for Consultation: Colitis   HPI: Tamara Stein is a 78 y.o. female with a past medical history of anxiety, depression, pretension, chronic kidney disease stage II, spinal cerebellar degeneration, hypothyroidism and diverticulitis.  Past hysterectomy cholecystectomy.  She presented to [facility] emergency room 05/30/2019 with complaints of nausea, vomiting, diarrhea and lower abdominal pain.  She is unable to tell me when her diarrhea started.  She denies having any bloody diarrhea.  No further diarrhea since she has been in the hospital.  She had similar symptoms of nausea, vomiting, diarrhea and abdominal pain 05/04/2019.  Her blood pressure was 80/60 in the ER at that time.  An abdominal/pelvic CT 05/04/2019 identified mild inflammatory changes around the sigmoid colon, diverticula were present to this area as diverticulitis could not be excluded.  There was no evidence of an abscess or perforation..  A GI pathogen and C. difficile stool studies were negative.  She was received Zosyn IV.  Her diarrhea abated and she was discharged home on Augmentin 875 mg p.o. twice daily for 7 days.  She denies taking any other antibiotics since then.  She takes Diclofenac twice daily.  No alcohol use.  She underwent a colonoscopy 10/18/2012 which was reported as normal.  She lives with her sister and brother-in-law.  She is not ambulatory due to her cerebellar degeneration, she utilizes a motorized scooter at home.  Currently, she denies having any nausea or vomiting.  She continues to have generalized abdominal pain.  She is tolerating a full liquid diet.  She is urinating dark orange urine, 300 cc collected in the urine container today. No family at the bedside.   ED Course 05/30/2019: Sodium 143.  Potassium 4.0.  Glucose 153.  BUN 39.  Creatinine 1.42.   Albumin 3.6.  Lipase 31.  AST 24. ALT 37.  Total bili 0.3.  Total protein 6.3.  WBC 14.4.  Hemoglobin 11.8.  Hematocrit 38.9.  MCV 100.8.  Platelet 222.  Abdominal/pelvic CT with IV contrast 05/31/2019: 1. 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 No pericolonic abscess or free air. 2. 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 3. Large paraesophageal hernia. 4. Stable 4 cm left adnexal cyst. 5. Aortic Atherosclerosis  6. Hepatobiliary: The liver is normal in density without focal abnormality.The main portal vein is patent. The patient is status post cholecystectomy. No biliary ductal dilation. Again noted is mild dilatation of the common bile duct.  Labs 05/31/2019: Creatinine 1.29.  WBC 32.9.  Hemoglobin 9.8.  Hematocrit 32.8.  MCV 101.2.  Platelet 184. SARS coronavirus 2 negative.  Urine: Hazy, small leukocyte, nitrite positive.  Urine culture pending.  Labs 06/01/2019: Sodium 144.  Potassium 4.2.  Glucose 97.  BUN 33.  Creatinine 1.26.  Alk phos 88.  AST 313.  ALT 530.  Total bili 0.6.  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HCC)    Chronic kidney disease    Chronic knee pain    Depression    GERD (gastroesophageal reflux disease)    Hyperlipidemia    Hypertension    Thyroid disease    Past Surgical History:  Procedure Laterality Date   ABDOMINAL HYSTERECTOMY     CHOLECYSTECTOMY     MOUTH SURGERY  RIGHT ELBOW      Prior to Admission medications   Medication Sig Start Date End Date Taking? Authorizing Provider  amLODipine (NORVASC) 10 MG tablet TAKE 1 TABLET DAILY FOR    BLOOD PRESSURE Patient taking differently: Take 10 mg by mouth every morning.  04/26/19  Yes Ronnie Doss M, DO  aspirin EC 81 MG tablet Take 81 mg by mouth every morning.    Yes [provider]  atorvastatin (LIPITOR) 40 MG tablet Take 1 tablet (40  mg total) by mouth at bedtime. Do not restart until instructed by MD 05/14/19  Yes Tat, Shanon Brow, MD  Bacillus Coagulans-Inulin (ALIGN PREBIOTIC-PROBIOTIC) 5-1.25 MG-GM CHEW Chew 1 tablet by mouth daily. 11/14/18  Yes Isla Pence, MD  Calcium Carbonate-Vitamin D (CALTRATE 600+D PO) Take 1 tablet by mouth daily.   Yes [provider]  cetirizine (ZYRTEC) 10 MG chewable tablet Chew 10 mg by mouth daily.   Yes [provider]  diclofenac (VOLTAREN) 50 MG EC tablet TAKE 1 TABLET BY MOUTH (50MG TOTAL) THREE TIMES DAILY Patient taking differently: Take 50-100 mg by mouth 2 (two) times daily. 100MG IN THE MORNING AND 50MG AT BEDTIME 04/27/19  Yes Gottschalk, Ashly M, DO  DULoxetine (CYMBALTA) 60 MG capsule Take 1 capsule (60 mg total) by mouth daily. Patient taking differently: Take 60 mg by mouth every morning.  04/27/19  Yes Gottschalk, Leatrice Jewels M, DO  furosemide (LASIX) 20 MG tablet Take 1 tablet (20 mg total) by mouth daily as needed. As needed for swelling 08/25/17  Yes Eustaquio Maize, MD  levothyroxine (SYNTHROID) 50 MCG tablet TAKE 1 TABLET ONCE DAILY Patient taking differently: Take 50 mcg by mouth daily before breakfast.  04/26/19  Yes Gottschalk, Ashly M, DO  lisinopril (ZESTRIL) 30 MG tablet Take 30 mg by mouth daily.   Yes [provider]  meclizine (ANTIVERT) 25 MG tablet Take 25 mg by mouth 3 (three) times daily as needed for dizziness.   Yes [provider]  Multiple Vitamin (MULTIVITAMIN WITH MINERALS) TABS tablet Take 1 tablet by mouth daily.   Yes [provider]  omeprazole (PRILOSEC) 40 MG capsule TAKE 1 CAPSULE DAILY Patient taking differently: Take 40 mg by mouth daily before breakfast.  04/26/19  Yes Gottschalk, Ashly M, DO  ondansetron (ZOFRAN) 4 MG tablet Take 4 mg by mouth every 8 (eight) hours as needed for nausea or vomiting.   Yes [provider]  raloxifene (EVISTA) 60 MG tablet Take 1 tablet (60 mg total) by mouth daily. Patient  taking differently: Take 60 mg by mouth every morning.  04/27/19  Yes Gottschalk, Leatrice Jewels M, DO  atorvastatin (LIPITOR) 40 MG tablet Take 1 tablet (40 mg total) by mouth at bedtime. 11/05/18   Janora Norlander, DO  DULoxetine (CYMBALTA) 60 MG capsule Take 1 capsule (60 mg total) by mouth daily. 11/05/18   Janora Norlander, DO    Current Facility-Administered Medications  Medication Dose Route Frequency Provider Last Rate Last Dose   0.9 %  sodium chloride infusion   Intravenous Continuous Oswald Hillock, MD 100 mL/hr at 06/01/19 0916     acetaminophen (TYLENOL) tablet 650 mg  650 mg Oral Q6H PRN Oswald Hillock, MD   650 mg at 05/31/19 2305   Or   acetaminophen (TYLENOL) suppository 650 mg  650 mg Rectal Q6H PRN Oswald Hillock, MD       amLODipine (NORVASC) tablet 10 mg  10 mg Oral q morning - 10a Lama,  Marge Duncans, MD   10 mg at 06/01/19 0910   aspirin EC tablet 81 mg  81 mg Oral q morning - 10a Oswald Hillock, MD   81 mg at 06/01/19 0914   atorvastatin (LIPITOR) tablet 40 mg  40 mg Oral QHS Oswald Hillock, MD   40 mg at 05/31/19 2255   cefTRIAXone (ROCEPHIN) 2 g in sodium chloride 0.9 % 100 mL IVPB  2 g Intravenous Q24H Barton Dubois, MD 200 mL/hr at 06/01/19 0557 2 g at 06/01/19 0557   DULoxetine (CYMBALTA) DR capsule 60 mg  60 mg Oral q morning - 10a Oswald Hillock, MD   60 mg at 06/01/19 0914   enoxaparin (LOVENOX) injection 40 mg  40 mg Subcutaneous Q24H Oswald Hillock, MD   40 mg at 05/31/19 1045   levothyroxine (SYNTHROID) tablet 50 mcg  50 mcg Oral QAC breakfast Oswald Hillock, MD   50 mcg at 06/01/19 0558   loratadine (CLARITIN) tablet 10 mg  10 mg Oral Daily Oswald Hillock, MD   10 mg at 06/01/19 1157   meclizine (ANTIVERT) tablet 25 mg  25 mg Oral TID PRN Oswald Hillock, MD       metroNIDAZOLE (FLAGYL) IVPB 500 mg  500 mg Intravenous Q8H Oswald Hillock, MD 100 mL/hr at 06/01/19 0446 500 mg at 06/01/19 0446   ondansetron (ZOFRAN) tablet 4 mg  4 mg Oral Q6H PRN Oswald Hillock, MD        Or   ondansetron [facility]) injection 4 mg  4 mg Intravenous Q6H PRN Oswald Hillock, MD       pantoprazole (PROTONIX) EC tablet 40 mg  40 mg Oral Daily Oswald Hillock, MD   40 mg at 06/01/19 0910   raloxifene (EVISTA) tablet 60 mg  60 mg Oral q morning - 10a Oswald Hillock, MD   60 mg at 06/01/19 0910   sodium chloride flush (NS) 0.9 % injection 3 mL  3 mL Intravenous Once Ripley Fraise, MD        Allergies as of 05/30/2019 - Review Complete 05/30/2019  Allergen Reaction Noted   Sulfa antibiotics Anaphylaxis 03/25/2014   Codeine Other (See Comments) 03/25/2014   Doxycycline Nausea Only Sep 09, 202019   Erythromycin Swelling 01/21/2015   Ibuprofen Other (See Comments) 04/26/2016   Levaquin [levofloxacin] Other (See Comments) 07/13/2018   Penicillins Diarrhea 03/25/2014    Family History  Problem Relation Age of Onset   Arthritis Sister    Hyperlipidemia Sister    Hypertension Sister    Cancer Brother    Diabetes Brother    Heart disease Brother     Social History   Socioeconomic History   Marital status: Widowed    Spouse name: Not on file   Number of children: Not on file   Years of education: Not on file   Highest education level: Not on file  Occupational History   Not on file  Social Needs   Financial resource strain: Not hard at all   Food insecurity    Worry: Never true    Inability: Never true   Transportation needs    Medical: Not on file    Non-medical: Not on file  Tobacco Use   Smoking status: Never Smoker   Smokeless tobacco: Never Used  Substance and Sexual Activity   Alcohol use: No   Drug use: No   Sexual activity: Never  Lifestyle   Physical activity    Days  per week: Not on file    Minutes per session: Not on file   Stress: Not on file  Relationships   Social connections    Talks on phone: Not on file    Gets together: Not on file    Attends religious service: Not on file    Active member of club or  organization: Not on file    Attends meetings of clubs or organizations: Not on file    Relationship status: Not on file   Intimate partner violence    Fear of current or ex partner: Not on file    Emotionally abused: Not on file    Physically abused: Not on file    Forced sexual activity: Not on file  Other Topics Concern   Not on file  Social History Narrative   Not on file    Review of Systems: See HPI, all other systems reviewed and are negative  Physical Exam: Vital signs in last 24 hours: Temp:  [98 F (36.7 C)-98.3 F (36.8 C)] 98.3 F (36.8 C) (10/21 0506) Pulse Rate:  [88-97] 88 (10/21 0506) Resp:  [16-22] 16 (10/21 0506) BP: (101-130)/(54-65) 130/61 (10/21 0506) SpO2:  [92 %-98 %] 95 % (10/21 0906) Weight:  [86.3 kg] 86.3 kg (10/20 1647) Last BM Date: 05/30/19 General:   Alert 78 year old female in no acute distress. Head:  Normocephalic and atraumatic. Eyes:  Sclera clear, no icterus. Conjunctiva pink. Ears:  Normal auditory acuity. Nose:  No deformity, discharge or lesions. Mouth:  No deformity or lesions.  Missing dentition. Neck:  Supple. Lungs: Crackles to the right lower base, few faint expiratory wheezes, no rhonchi Heart: Regular rate and rhythm, no murmurs Abdomen: Soft, nondistended, generalized mild tenderness throughout without rebound or guarding, positive bowel sounds to all 4 quadrants, right upper quadrant and central lower abdominal scar intact Rectal:  Deferred  Msk:  Symmetrical without gross deformities. Extremities:  Without clubbing or edema. Neurologic:  Alert and  oriented x4, her speech is somewhat difficult to understand due to lack of dentition, she answers questions appropriately, she has equal strength to her upper extremities Skin: Multiple patches of ecchymosis to her upper extremities Psych:  Alert and cooperative. Normal mood and affect.  Intake/Output from previous day: 10/20 0701 - 10/21 0700 In: 1796.2 [P.O.:60;  I.V.:1586.2; IV Piggyback:150] Out: 300 [Urine:300] Intake/Output this shift: Total I/O In: 60 [P.O.:60] Out: -   Lab Results: Recent Labs    05/30/19 2305 05/31/19 0951 06/01/19 0451  WBC 14.4* 32.9* 22.7*  HGB 11.8* 9.8* 8.6*  HCT 38.9 32.8* 28.5*  PLT 222 184 142*   BMET Recent Labs    05/30/19 2305 05/31/19 0951 06/01/19 0451  NA 143  --  144  K 4.0  --  4.2  CL 112*  --  117*  CO2 21*  --  21*  GLUCOSE 153*  --  97  BUN 39*  --  33*  CREATININE 1.42* 1.29* 1.26*  CALCIUM 8.7*  --  7.6*   LFT Recent Labs    06/01/19 0451  PROT 5.1*  ALBUMIN 2.7*  AST 313*  ALT 530*  ALKPHOS 88  BILITOT 0.6    Studies/Results: Ct Abdomen Pelvis W Contrast  Result Date: 05/31/2019 CLINICAL DATA:  Abdominal pain for 4 hours EXAM: CT ABDOMEN AND PELVIS WITH CONTRAST TECHNIQUE: Multidetector CT imaging of the abdomen and pelvis was performed using the standard protocol following bolus administration of intravenous contrast. CONTRAST:  74m OMNIPAQUE IOHEXOL 300 MG/ML  SOLN COMPARISON:  MRI May 27, 2019, CT May 04, 2019 FINDINGS: Lower chest: There is a large paraesophageal hernia present. There is mild cardiomegaly. The visualized portions of the lungs are clear. Hepatobiliary: The liver is normal in density without focal abnormality.The main portal vein is patent. The patient is status post cholecystectomy. No biliary ductal dilation. Again noted is mild dilatation of the common bile duct. Pancreas: Unremarkable. No pancreatic ductal dilatation or surrounding inflammatory changes. Spleen: Normal in size. There is an unchanged 3 cm low-density lesion seen within the splenic dome. Adrenals/Urinary Tract: Both adrenal glands appear normal. Low-density lesions seen within both kidneys, shown to be renal cysts on recent MRI. There is also a tiny fat containing lesion in the upper pole the left kidney, consistent with angiomyolipoma Stomach/Bowel: The small bowel is unremarkable.  There is a focal segment of colon within the splenic flexure and descending colon that appears to have diffuse bowel wall thickening with surrounding mesenteric fat stranding changesscattered colonic diverticula are noted within the descending colon. There is a moderate amount of colonic stool. No pericolonic free fluid or Vascular/Lymphatic: There are no enlarged mesenteric, retroperitoneal, or pelvic lymph nodes. Scattered aortic atherosclerotic calcifications are seen without aneurysmal dilatation. Reproductive: Again noted within the left adnexa a 4 cm low-density lesion which appears to be present and stable since April 2020. The lesion was present dating back to 2017, however minimally enlarged since that exam. Other: A small fat containing anterior umbilical hernia seen. Musculoskeletal: No acute or significant osseous findings. Chronic superior compression deformity of the L4 vertebral bodies seen with 50% loss in vertebral body height. IMPRESSION: 1. 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 No pericolonic abscess or free air. 2. 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 3. Large paraesophageal hernia. 4. Stable 4 cm left adnexal cyst. 5.  Aortic Atherosclerosis (ICD10-I70.0). Electronically Signed   By: Prudencio Pair M.D.   On: 05/31/2019 02:08    IMPRESSION/PLAN:  56.  78 year old female with nausea/vomiting and diarrhea.  Abdominal/pelvic CT with IV contrast identified colitis at the splenic flexure and to the proximal descending colon.  -Check GI pathogen and C. difficile PCR if diarrhea recurs (previously ordered but discontinued by ID) -Continue normal saline at 100 cc an hour -Continue Flagyl 500 mg IV every 8 hours.  Ceftriaxone 2 g IV every 24 hours (is allergic to fluoroquinolones) -Consider completing a diagnostic colonoscopy to rule out colitis during her hospital admission, further recommendations per Dr. Dorien Chihuahua later  today  2.  Leukocytosis secondary to infectious/inflammatory colitis and possible UTI.  WBC 22.7 down from 32.9.  She is afebrile. -Continue Flagyl 500 mg IV every 8 hours.  Ceftriaxone 2 g IV every 24 hours (is allergic to fluoroquinolones) -Repeat CBC with differential in a.m.  3.  Elevated LFTs with normal total bili and alk phos levels.  Admission LFTs were normal.  10/21 AST 313.  ALT 530.  Elevated LFTs may be due to episodic hypotension, BP as low as 99/63 yesterday.  Blood pressure 125-130/ 50- 60s today.  CT shows mild dilatation of the common bile duct, her alk phos and total bilirubin are not elevated therefore unlikely choledocholithiasis.  Past cholecystectomy. -Acute hepatitis panel  -Repeat hepatic panel in am  4.  Chronic kidney disease stage III.  Creatinine 1.26 down from 1.42.   Patrecia Pour Kennedy-Smith  06/01/2019, 12:39 PM

## 2019-06-01 NOTE — Progress Notes (Signed)
Patient Demographics:    Tamara Stein, is a 78 y.o. female, DOB - Oct 21, 1940, HLK:[id number]  Admit date - 05/30/2019   Admitting Physician Oswald Hillock, MD  Outpatient Primary MD for the patient is Janora Norlander, DO  LOS - 0   Chief Complaint  Patient presents with   Abdominal Pain        Subjective:    Tamara Stein today has no fevers, no emesis,  No chest pain, complains of abdominal pain, -Complains of weakness and malaise  Assessment  & Plan :    Active Problems:   Colitis   Symptomatic anemia  Brief summary -78 y.o. female with a past medical history of anxiety, depression, pretension, chronic kidney disease stage II, spinal cerebellar degeneration, hypothyroidism and diverticulitis admitted on 05/31/2019 with abdominal pain and elevated LFTs and CT abdomen findings of colitis   A/p 1) splenic flexure and proximal descending colitis --- CT abdomen and pelvis findings noted, patient is allergic to quinolones and penicillins, - continue Rocephin and Flagyl,  -GI consult requested and pending -WBC down to 22K from 32.9K -Abdominal pain persist -continue full liquid diet  2)Elevated LFTs--- review of records shows intermittent LFTs elevation in the past--Had imaging work-up previously, -Discussed with Dr. Laural Golden from GI service, -ALT is up to 530 and AST is up to 313 -T bili 0.6, alk phos is 88 -Patient is status post prior cholecystectomy  3) CKD stage - III, suspect worsening renal function due to dehydration, E. coli UTI and soft BP,   creatinine on admission= 1.45 ,   baseline creatinine = 1.3 to 1.4    , creatinine is now= 1.26     , renally adjust medications, avoid nephrotoxic agents / dehydration /hypotension  4)E coli UTI--continue IV Rocephin pending sensitivity   5)Depression and anxiety-continue Cymbalta 60 mg daily  6)h/o CVA/cerebral  degeneration  7)HTN--continue amlodipine 10 mg daily  8) acute on chronic chronic anemia-Hemoccult blood negative, baseline hemoglobin recently around 11, admission hemoglobin 11.8, hemoglobin down to 8.6 at this time after hydration -Continue to monitor closely, no evidence of ongoing bleeding at this time elevated MCV noted  9)hypothyroidism-continue levothyroxine 50 mcg daily  10)GERD/Hiatal Hernia- continue Protonix  Disposition/Need for in-Hospital Stay- patient unable to be discharged at this time due to -colitis and E. coli UTI requiring IV antibiotics  Code Status : DNR  Family Communication:   (patient is alert, awake and coherent)  Disposition Plan  : TBD  Consults  :  Gi  DVT Prophylaxis  :  Lovenox - SCDs   Lab Results  Component Value Date   PLT 142 (L) 06/01/2019    Inpatient Medications  Scheduled Meds:  amLODipine  10 mg Oral q morning - 10a   aspirin EC  81 mg Oral q morning - 10a   atorvastatin  40 mg Oral QHS   DULoxetine  60 mg Oral q morning - 10a   enoxaparin (LOVENOX) injection  40 mg Subcutaneous Q24H   levothyroxine  50 mcg  Oral QAC breakfast   loratadine  10 mg Oral Daily   pantoprazole  40 mg Oral Daily   raloxifene  60 mg Oral q morning - 10a   sodium chloride flush  3 mL Intravenous Once   Continuous Infusions:  sodium chloride 100 mL/hr at 06/01/19 0916   cefTRIAXone (ROCEPHIN)  IV 2 g (06/01/19 0557)   metronidazole 500 mg (06/01/19 1425)   PRN Meds:.acetaminophen **OR** acetaminophen, meclizine, ondansetron **OR** ondansetron (ZOFRAN) IV    Anti-infectives (From admission, onward)   Start     Dose/Rate Route Frequency Ordered Stop   06/01/19 0600  cefTRIAXone (ROCEPHIN) 2 g in sodium chloride 0.9 % 100 mL IVPB     2 g 200 mL/hr over 30 Minutes Intravenous Every 24 hours 05/31/19 0926     06/01/19 0500  cefTRIAXone (ROCEPHIN) 1 g in sodium chloride 0.9 % 100 mL IVPB  Status:  Discontinued     1 g 200 mL/hr over 30  Minutes Intravenous Every 24 hours 05/31/19 0921 05/31/19 0926   05/31/19 1200  metroNIDAZOLE (FLAGYL) IVPB 500 mg     500 mg 100 mL/hr over 60 Minutes Intravenous Every 8 hours 05/31/19 0921     05/31/19 0300  cefTRIAXone (ROCEPHIN) 1 g in sodium chloride 0.9 % 100 mL IVPB     1 g 200 mL/hr over 30 Minutes Intravenous  Once 05/31/19 0250 05/31/19 0437   05/31/19 0300  metroNIDAZOLE (FLAGYL) IVPB 500 mg     500 mg 100 mL/hr over 60 Minutes Intravenous  Once 05/31/19 0250 05/31/19 0545        Objective:   Vitals:   05/31/19 2113 06/01/19 0506 06/01/19 0906 06/01/19 1427  BP: (!) 125/54 130/61  135/65  Pulse: 95 88  88  Resp: _0 Temp: 98.3 F (36.8 C) 98.3 F (36.8 C)  98.3 F (36.8 C)  TempSrc: Oral Oral  Oral  SpO2: 97% 93% 95% 96%  Weight:      Height:        Wt Readings from Last 3 Encounters:  05/31/19 86.3 kg  05/07/19 87 kg  11/14/18 90.7 kg     Intake/Output Summary (Last 24 hours) at 06/01/2019 1716 Last data filed at 06/01/2019 1533 Gross per 24 hour  Intake 1976.21 ml  Output 300 ml  Net 1676.21 ml     Physical Exam  Gen:- Awake Alert,   HEENT:- [place].AT, No sclera icterus Neck-Supple Neck,No JVD,.  Lungs-  CTAB , fair symmetrical air movement CV- S1, S2 normal, regular  Abd-  +ve B.Sounds, Abd Soft, left-sided abdominal tenderness, no CVA tenderness,    Extremity/Skin:- pedal pulses present  Psych-affect is appropriate, oriented x3 Neuro-generalized weakness, no new focal deficits, no tremors   Data Review:   Micro Results Recent Results (from the past 240 hour(s))  Culture, Urine     Status: Abnormal (Preliminary result)   Collection Time: 05/31/19  2:16 AM   Specimen: Urine, Clean Catch  Result Value Ref Range Status   Specimen Description   Final    URINE, CLEAN CATCH Performed at [facility], [address] [place], [place] [address]    Special Requests   Final    NONE Performed at [facility], [address],  [place], [place] [address]    Culture (A)  Final    >=100,000 COLONIES/mL ESCHERICHIA COLI SUSCEPTIBILITIES TO FOLLOW Performed at [place] [address], [place], [place] [address]    Report Status  PENDING  Incomplete  SARS CORONAVIRUS 2 (TAT 6-24 HRS) Nasopharyngeal Nasopharyngeal Swab     Status: None   Collection Time: 05/31/19  3:19 AM   Specimen: Nasopharyngeal Swab  Result Value Ref Range Status   SARS Coronavirus 2 NEGATIVE NEGATIVE Final    Comment: (NOTE) SARS-CoV-2 target nucleic acids are NOT DETECTED. The SARS-CoV-2 RNA is generally detectable in upper and lower respiratory specimens during the acute phase of infection. Negative results do not preclude SARS-CoV-2 infection, do not rule out co-infections with other pathogens, and should not be used as the sole basis for treatment or other patient management decisions. Negative results must be combined with clinical observations, patient history, and epidemiological information. The expected result is Negative. Fact Sheet for Patients: SugarRoll.be Fact Sheet for Healthcare Providers: https://www.woods-mathews.com/ This test is not yet approved or cleared by the [place] FDA and  has been authorized for detection and/or diagnosis of SARS-CoV-2 by FDA under an Emergency Use Authorization (EUA). This EUA will remain  in effect (meaning this test can be used) for the duration of the COVID-19 declaration under Section 56 4(b)(1) of the Act, 21 U.S.C. section 360bbb-3(b)(1), unless the authorization is terminated or revoked sooner. Performed at [facility], [place] [address] [place], [place] [address]     Radiology Reports Ct Abdomen Pelvis Wo Contrast  Result Date: 05/04/2019 CLINICAL DATA:  Abdominal pain.  Diarrhea. EXAM: CT ABDOMEN AND PELVIS WITHOUT CONTRAST TECHNIQUE: Multidetector CT imaging of the abdomen and pelvis was performed following the standard  protocol without IV contrast. COMPARISON:  CT 11/14/2018 FINDINGS: Lower chest: Lung bases are clear.  Large hiatal hernia. Hepatobiliary: Gallbladder is absent. Normal appearance of the liver. Extrahepatic bile duct appears to be prominent and similar to the previous examination. Pancreas: Unremarkable. No pancreatic ductal dilatation or surrounding inflammatory changes. Spleen: Again noted is a low-density lesion involving the superior aspect of the spleen. This lesion roughly measures 3.0 x 2.6 x 2.3 cm. Lesion measured 3.0 x 2.5 x 1.4 cm in April 2020. Lesion was probably present in 2017 but clearly much smaller. Adrenals/Urinary Tract: Normal adrenal glands. Low-density renal structures probably represent small cysts. No hydronephrosis. Urinary bladder is unremarkable. Negative for kidney stones. Stomach/Bowel: Large hiatal hernia. Normal appearance of the duodenum. Mild inflammatory changes around the sigmoid colon and there are few colonic diverticula involving the sigmoid colon. Mild pericolonic edema in left lower quadrant involving the proximal sigmoid colon and distal descending colon. Mild distention of the cecum is similar to the previous examination. No small bowel dilatation. Vascular/Lymphatic: Atherosclerotic calcifications in the aorta and iliac arteries without aneurysm. No significant lymph node enlargement in the abdomen or pelvis. Reproductive: Again noted is a low-density structure involving the left adnexa measuring roughly 4.2 cm and stable from the exam in April 2020 and minimally enlarged since 2017. Uterus has been removed. Other: Small amount of fluid in the pelvis.  Negative for free air. Musculoskeletal: Chronic compression deformity involving superior endplate of L4. Facet arthropathy in the lumbar spine. IMPRESSION: 1. Trace free fluid in the pelvis and concern for mild inflammatory changes around the sigmoid colon. There are colonic diverticula in this area. Unfortunately, there is  significant motion artifact in this area that limits evaluation of the pelvis. Cannot exclude diverticulitis involving the sigmoid colon. No evidence for an abscess collection. 2. Low-density splenic lesion is nonspecific but could be cystic. Lesion may have slightly enlarged since April 2020 but clearly enlarged since 2017. Based on the  evidence for enlargement since 2017, consider further characterization of the splenic lesion with MRI or follow-up CT in 3-6 months to ensure stability. 3. Minimal change in the low-density left adnexal lesion. This probably represents a cystic structure and favor a benign etiology based on the minimal change since 2017. 4. Large hiatal hernia. 5. Probable bilateral renal cysts. Electronically Signed   By: Markus Daft M.D.   On: 05/04/2019 19:19   Mr Abdomen W Wo Contrast  Result Date: 05/27/2019 CLINICAL DATA:  Enlarging splenic lesion on CT. EXAM: MRI ABDOMEN WITHOUT AND WITH CONTRAST TECHNIQUE: Multiplanar multisequence MR imaging of the abdomen was performed both before and after the administration of intravenous contrast. CONTRAST:  22m GADAVIST GADOBUTROL 1 MMOL/ML IV SOLN COMPARISON:  05/04/2019 CT abdomen/pelvis. FINDINGS: Lower chest: No acute abnormality at the lung bases. Hepatobiliary: Normal liver size and configuration. No hepatic steatosis. Segment 4A left liver dome simple subcentimeter cyst. No additional liver lesions. Cholecystectomy. Bile ducts are within normal post cholecystectomy limits. Common bile duct diameter 8 mm. No evidence of choledocholithiasis, biliary strictures or beading. Pancreas: There is a 0.7 cm cystic pancreatic body lesion (series 9/image 24) without wall thickening, convincing enhancement or thick septations. No additional pancreatic lesions. No pancreatic duct dilation. No definite pancreas divisum. Spleen: Normal size spleen. There is a 3.1 x 2.8 cm cystic splenic mass (series 9/image 7) with a few thin internal septations, without  wall thickening, solid enhancement or thickened septations, compatible with a splenic lymphangioma. No additional splenic lesions. Adrenals/Urinary Tract: Normal adrenals. No hydronephrosis. Several simple renal cysts scattered throughout both kidneys, largest 1.6 cm in the upper right kidney and 1.3 cm in the upper left kidney. 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 Stomach/Bowel: Large hiatal hernia. Otherwise normal nondistended stomach. Visualized small and large bowel is normal caliber, with no bowel wall thickening. Vascular/Lymphatic: Atherosclerotic nonaneurysmal abdominal aorta. Patent portal, splenic, hepatic and renal veins. No pathologically enlarged lymph nodes in the abdomen. Other: No abdominal ascites or focal fluid collection. Musculoskeletal: No aggressive appearing focal osseous lesions. 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 2. Tiny 0.7 cm cystic pancreatic body lesion without high risk MRI features, most commonly a side branch IPMN. No biliary or pancreatic duct dilation. Follow-up MRI abdomen without and with IV contrast recommended in 2 years. This recommendation follows ACR consensus guidelines: Management of Incidental Pancreatic Cysts: A White Paper of the ACR Incidental Findings Committee. J Am Coll Radiol 26222;[id number] 3. 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 4. Large hiatal hernia. 5.  Aortic Atherosclerosis (ICD10-I70.0). Electronically Signed   By: JIlona SorrelM.D.   On: 05/27/2019 13:24   Ct Abdomen Pelvis W Contrast  Result Date: 05/31/2019 CLINICAL DATA:  Abdominal pain for 4 hours EXAM: CT ABDOMEN AND PELVIS WITH CONTRAST TECHNIQUE: Multidetector CT imaging of the abdomen and pelvis was performed using the standard protocol following bolus administration of intravenous contrast. CONTRAST:  749mOMNIPAQUE IOHEXOL 300 MG/ML  SOLN COMPARISON:  MRI May 27, 2019, CT  May 04, 2019 FINDINGS: Lower chest: There is a large paraesophageal hernia present. There is mild cardiomegaly. The visualized portions of the lungs are clear. Hepatobiliary: The liver is normal in density without focal abnormality.The main portal vein is patent. The patient is status post cholecystectomy. No biliary ductal dilation. Again noted is mild dilatation of the common bile duct. Pancreas: Unremarkable. No pancreatic ductal dilatation or surrounding inflammatory changes.  Spleen: Normal in size. There is an unchanged 3 cm low-density lesion seen within the splenic dome. Adrenals/Urinary Tract: Both adrenal glands appear normal. Low-density lesions seen within both kidneys, shown to be renal cysts on recent MRI. There is also a tiny fat containing lesion in the upper pole the left kidney, consistent with angiomyolipoma Stomach/Bowel: The small bowel is unremarkable. There is a focal segment of colon within the splenic flexure and descending colon that appears to have diffuse bowel wall thickening with surrounding mesenteric fat stranding changesscattered colonic diverticula are noted within the descending colon. There is a moderate amount of colonic stool. No pericolonic free fluid or Vascular/Lymphatic: There are no enlarged mesenteric, retroperitoneal, or pelvic lymph nodes. Scattered aortic atherosclerotic calcifications are seen without aneurysmal dilatation. Reproductive: Again noted within the left adnexa a 4 cm low-density lesion which appears to be present and stable since April 2020. The lesion was present dating back to 2017, however minimally enlarged since that exam. Other: A small fat containing anterior umbilical hernia seen. Musculoskeletal: No acute or significant osseous findings. Chronic superior compression deformity of the L4 vertebral bodies seen with 50% loss in vertebral body height. IMPRESSION: 1. 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 No  pericolonic abscess or free air. 2. 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 3. Large paraesophageal hernia. 4. Stable 4 cm left adnexal cyst. 5.  Aortic Atherosclerosis (ICD10-I70.0). Electronically Signed   By: Prudencio Pair M.D.   On: 05/31/2019 02:08   Dg Chest Port 1 View  Result Date: 05/04/2019 CLINICAL DATA:  Nausea vomiting abdominal pain EXAM: PORTABLE CHEST 1 VIEW COMPARISON:  April 17, 2016 FINDINGS: There is mild cardiomegaly. Aortic knob calcifications. There is prominence to the pulmonary vasculature. Air fluid-filled hiatal hernia is present. Degenerative changes in bilateral shoulders. IMPRESSION: Mild cardiomegaly.  Pulmonary vascular congestion. Large hiatal hernia. Electronically Signed   By: Prudencio Pair M.D.   On: 05/04/2019 17:29     CBC Recent Labs  Lab 05/30/19 2305 05/31/19 0951 06/01/19 0451  WBC 14.4* 32.9* 22.7*  HGB 11.8* 9.8* 8.6*  HCT 38.9 32.8* 28.5*  PLT 222 184 142*  MCV 100.8* 101.2* 102.2*  MCH 30.6 30.2 30.8  MCHC 30.3 29.9* 30.2  RDW 14.5 14.6 14.9    Chemistries  Recent Labs  Lab 05/30/19 2305 05/31/19 0951 06/01/19 0451  NA 143  --  144  K 4.0  --  4.2  CL 112*  --  117*  CO2 21*  --  21*  GLUCOSE 153*  --  97  BUN 39*  --  33*  CREATININE 1.42* 1.29* 1.26*  CALCIUM 8.7*  --  7.6*  AST 24  --  313*  ALT 37  --  530*  ALKPHOS 84  --  88  BILITOT 0.3  --  0.6   ------------------------------------------------------------------------------------------------------------------ No results for input(s): CHOL, HDL, LDLCALC, TRIG, CHOLHDL, LDLDIRECT in the last 72 hours.  Lab Results  Component Value Date   HGBA1C 5.2 05/09/2016   ------------------------------------------------------------------------------------------------------------------ No results for input(s): TSH, T4TOTAL, T3FREE, THYROIDAB in the last 72 hours.  Invalid input(s):  FREET3 ------------------------------------------------------------------------------------------------------------------ No results for input(s): VITAMINB12, FOLATE, FERRITIN, TIBC, IRON, RETICCTPCT in the last 72 hours.  Coagulation profile No results for input(s): INR, PROTIME in the last 168 hours.  No results for input(s): DDIMER in the last 72 hours.  Cardiac Enzymes No results for input(s): CKMB, TROPONINI, MYOGLOBIN in the last 168 hours.  Invalid input(s): CK ------------------------------------------------------------------------------------------------------------------

## 2019-06-02 DIAGNOSIS — R7989 Other specified abnormal findings of blood chemistry: Secondary | ICD-10-CM

## 2019-06-02 DIAGNOSIS — R945 Abnormal results of liver function studies: Secondary | ICD-10-CM | POA: Diagnosis not present

## 2019-06-02 DIAGNOSIS — D649 Anemia, unspecified: Secondary | ICD-10-CM | POA: Diagnosis not present

## 2019-06-02 DIAGNOSIS — K529 Noninfective gastroenteritis and colitis, unspecified: Secondary | ICD-10-CM | POA: Diagnosis not present

## 2019-06-02 LAB — COMPREHENSIVE METABOLIC PANEL
ALT: 367 U/L — ABNORMAL HIGH (ref 0–44)
AST: 137 U/L — ABNORMAL HIGH (ref 15–41)
Albumin: 3 g/dL — ABNORMAL LOW (ref 3.5–5.0)
Alkaline Phosphatase: 87 U/L (ref 38–126)
Anion gap: 8 (ref 5–15)
BUN: 22 mg/dL (ref 8–23)
CO2: 20 mmol/L — ABNORMAL LOW (ref 22–32)
Calcium: 7.8 mg/dL — ABNORMAL LOW (ref 8.9–10.3)
Chloride: 116 mmol/L — ABNORMAL HIGH (ref 98–111)
Creatinine, Ser: 0.99 mg/dL (ref 0.44–1.00)
GFR calc Af Amer: >60 mL/min (ref >60)
GFR calc non Af Amer: 54 mL/min — ABNORMAL LOW (ref >60)
Glucose, Bld: 112 mg/dL — ABNORMAL HIGH (ref 70–99)
Potassium: 3.5 mmol/L (ref 3.5–5.1)
Sodium: 144 mmol/L (ref 135–145)
Total Bilirubin: 0.4 mg/dL (ref 0.3–1.2)
Total Protein: 5.5 g/dL — ABNORMAL LOW (ref 6.5–8.1)

## 2019-06-02 LAB — URINE CULTURE: Culture: >=100000 — AB

## 2019-06-02 LAB — CBC
HCT: 29.9 % — ABNORMAL LOW (ref 36.0–46.0)
Hemoglobin: 9 g/dL — ABNORMAL LOW (ref 12.0–15.0)
MCH: 30.5 pg (ref 26.0–34.0)
MCHC: 30.1 g/dL (ref 30.0–36.0)
MCV: 101.4 fL — ABNORMAL HIGH (ref 80.0–100.0)
Platelets: 153 10*3/uL (ref 150–400)
RBC: 2.95 MIL/uL — ABNORMAL LOW (ref 3.87–5.11)
RDW: 14.8 % (ref 11.5–15.5)
WBC: 11.9 10*3/uL — ABNORMAL HIGH (ref 4.0–10.5)
nRBC: 0 % (ref 0.0–0.2)

## 2019-06-02 LAB — HEPATITIS PANEL, ACUTE
HCV Ab: NONREACTIVE
Hep A IgM: NONREACTIVE
Hep B C IgM: NONREACTIVE
Hepatitis B Surface Ag: NONREACTIVE

## 2019-06-02 LAB — FOLATE: Folate: 26.9 ng/mL (ref >5.9)

## 2019-06-02 LAB — VITAMIN B12: Vitamin B-12: 676 pg/mL (ref 180–914)

## 2019-06-02 MED ORDER — MELATONIN 3 MG PO TABS
6.0000 mg | ORAL_TABLET | Freq: Every evening | ORAL | Status: DC | PRN
  Administered 2019-06-02: 6 mg via ORAL
  Filled 2019-06-02: qty 2

## 2019-06-02 MED ORDER — LABETALOL HCL 5 MG/ML IV SOLN
10.0000 mg | INTRAVENOUS | Status: DC | PRN
  Administered 2019-06-03: 10 mg via INTRAVENOUS
  Filled 2019-06-02: qty 4

## 2019-06-02 MED ORDER — NON FORMULARY: 6.0000 mg | Freq: Every evening | Status: DC | PRN

## 2019-06-02 NOTE — Progress Notes (Signed)
Subjective:  Intermittent episodes of acute onset N/V/D occurring sometimes 3-4 weeks apart. Typically has more issues with constipation. When she has these episodes, she will have acute onset abd pain +/- N/V followed by 1 or 2 large volume liquid "blowouts" and afterwards her abdominal pain improves. She has had one stool since admission, per nursing it was solid therefore not collected for stool studies. Currently whole stomach "sore". Sometimes hurts more on the right than left. No melena, brbpr.   Objective: Vital signs in last 24 hours: Temp:  [98.1 F (36.7 C)-98.3 F (36.8 C)] 98.1 F (36.7 C) (10/22 0516) Pulse Rate:  [88-102] 102 (10/22 0516) Resp:  [17-20] 17 (10/22 0516) BP: (130-162)/(56-67) 162/67 (10/22 0516) SpO2:  [95 %-98 %] 98 % (10/22 0516) Last BM Date: 05/30/19 General:   Alert,  Obese, WF in NAD Head:  Normocephalic and atraumatic. Eyes:  Sclera clear, no icterus.  Abdomen:  Soft, obese, mild generalized tenderness but no guarding, and without rebound. +normal bowel sounds  Extremities:  Without clubbing, deformity or edema. Neurologic:  Alert and  oriented x4;  grossly normal neurologically. Skin:  Intact without significant lesions or rashes. Psych:  Alert and cooperative. Normal mood and affect.  Intake/Output from previous day: 10/21 0701 - 10/22 0700 In: 420 [P.O.:420] Out: 150 [Urine:150] Intake/Output this shift: No intake/output data recorded.  Lab Results: CBC Recent Labs    05/31/19 0951 06/01/19 0451 06/02/19 0451  WBC 32.9* 22.7* 11.9*  HGB 9.8* 8.6* 9.0*  HCT 32.8* 28.5* 29.9*  MCV 101.2* 102.2* 101.4*  PLT 184 142* 153   BMET Recent Labs    05/30/19 2305 05/31/19 0951 06/01/19 0451 06/02/19 0451  NA 143  --  144 144  K 4.0  --  4.2 3.5  CL 112*  --  117* 116*  CO2 21*  --  21* 20*  GLUCOSE 153*  --  97 112*  BUN 39*  --  33* 22  CREATININE 1.42* 1.29* 1.26* 0.99  CALCIUM 8.7*  --  7.6* 7.8*   LFTs Recent Labs     05/30/19 2305 06/01/19 0451 06/02/19 0451  BILITOT 0.3 0.6 0.4  ALKPHOS 84 88 87  AST 24 313* 137*  ALT 37 530* 367*  PROT 6.3* 5.1* 5.5*  ALBUMIN 3.6 2.7* 3.0*   Recent Labs    05/30/19 2305  LIPASE 31   PT/INR No results for input(s): LABPROT, INR in the last 72 hours.    Imaging Studies: Ct Abdomen Pelvis Wo Contrast  Result Date: 05/04/2019 CLINICAL DATA:  Abdominal pain.  Diarrhea. EXAM: CT ABDOMEN AND PELVIS WITHOUT CONTRAST TECHNIQUE: Multidetector CT imaging of the abdomen and pelvis was performed following the standard protocol without IV contrast. COMPARISON:  CT 11/14/2018 FINDINGS: Lower chest: Lung bases are clear.  Large hiatal hernia. Hepatobiliary: Gallbladder is absent. Normal appearance of the liver. Extrahepatic bile duct appears to be prominent and similar to the previous examination. Pancreas: Unremarkable. No pancreatic ductal dilatation or surrounding inflammatory changes. Spleen: Again noted is a low-density lesion involving the superior aspect of the spleen. This lesion roughly measures 3.0 x 2.6 x 2.3 cm. Lesion measured 3.0 x 2.5 x 1.4 cm in April 2020. Lesion was probably present in 2017 but clearly much smaller. Adrenals/Urinary Tract: Normal adrenal glands. Low-density renal structures probably represent small cysts. No hydronephrosis. Urinary bladder is unremarkable. Negative for kidney stones. Stomach/Bowel: Large hiatal hernia. Normal appearance of the duodenum. Mild inflammatory changes around the sigmoid colon and there are  few colonic diverticula involving the sigmoid colon. Mild pericolonic edema in left lower quadrant involving the proximal sigmoid colon and distal descending colon. Mild distention of the cecum is similar to the previous examination. No small bowel dilatation. Vascular/Lymphatic: Atherosclerotic calcifications in the aorta and iliac arteries without aneurysm. No significant lymph node enlargement in the abdomen or pelvis. Reproductive:  Again noted is a low-density structure involving the left adnexa measuring roughly 4.2 cm and stable from the exam in April 2020 and minimally enlarged since 2017. Uterus has been removed. Other: Small amount of fluid in the pelvis.  Negative for free air. Musculoskeletal: Chronic compression deformity involving superior endplate of L4. Facet arthropathy in the lumbar spine. IMPRESSION: 1. Trace free fluid in the pelvis and concern for mild inflammatory changes around the sigmoid colon. There are colonic diverticula in this area. Unfortunately, there is significant motion artifact in this area that limits evaluation of the pelvis. Cannot exclude diverticulitis involving the sigmoid colon. No evidence for an abscess collection. 2. Low-density splenic lesion is nonspecific but could be cystic. Lesion may have slightly enlarged since April 2020 but clearly enlarged since 2017. Based on the evidence for enlargement since 2017, consider further characterization of the splenic lesion with MRI or follow-up CT in 3-6 months to ensure stability. 3. Minimal change in the low-density left adnexal lesion. This probably represents a cystic structure and favor a benign etiology based on the minimal change since 2017. 4. Large hiatal hernia. 5. Probable bilateral renal cysts. Electronically Signed   By: Markus Daft M.D.   On: 05/04/2019 19:19   Mr Abdomen W Wo Contrast  Result Date: 05/27/2019 CLINICAL DATA:  Enlarging splenic lesion on CT. EXAM: MRI ABDOMEN WITHOUT AND WITH CONTRAST TECHNIQUE: Multiplanar multisequence MR imaging of the abdomen was performed both before and after the administration of intravenous contrast. CONTRAST:  4m GADAVIST GADOBUTROL 1 MMOL/ML IV SOLN COMPARISON:  05/04/2019 CT abdomen/pelvis. FINDINGS: Lower chest: No acute abnormality at the lung bases. Hepatobiliary: Normal liver size and configuration. No hepatic steatosis. Segment 4A left liver dome simple subcentimeter cyst. No additional liver  lesions. Cholecystectomy. Bile ducts are within normal post cholecystectomy limits. Common bile duct diameter 8 mm. No evidence of choledocholithiasis, biliary strictures or beading. Pancreas: There is a 0.7 cm cystic pancreatic body lesion (series 9/image 24) without wall thickening, convincing enhancement or thick septations. No additional pancreatic lesions. No pancreatic duct dilation. No definite pancreas divisum. Spleen: Normal size spleen. There is a 3.1 x 2.8 cm cystic splenic mass (series 9/image 7) with a few thin internal septations, without wall thickening, solid enhancement or thickened septations, compatible with a splenic lymphangioma. No additional splenic lesions. Adrenals/Urinary Tract: Normal adrenals. No hydronephrosis. Several simple renal cysts scattered throughout both kidneys, largest 1.6 cm in the upper right kidney and 1.3 cm in the upper left kidney. 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 Stomach/Bowel: Large hiatal hernia. Otherwise normal nondistended stomach. Visualized small and large bowel is normal caliber, with no bowel wall thickening. Vascular/Lymphatic: Atherosclerotic nonaneurysmal abdominal aorta. Patent portal, splenic, hepatic and renal veins. No pathologically enlarged lymph nodes in the abdomen. Other: No abdominal ascites or focal fluid collection. Musculoskeletal: No aggressive appearing focal osseous lesions. 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 2. Tiny 0.7 cm cystic pancreatic body lesion without high risk MRI features, most commonly a side branch IPMN. No biliary or pancreatic duct dilation. Follow-up MRI abdomen without  and with IV contrast recommended in 2 years. This recommendation follows ACR consensus guidelines: Management of Incidental Pancreatic Cysts: A White Paper of the ACR Incidental Findings Committee. J Am Coll Radiol 9038;33:383-291. 3. 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 4. Large hiatal hernia. 5.  Aortic Atherosclerosis (ICD10-I70.0). Electronically Signed   By: Ilona Sorrel M.D.   On: 05/27/2019 13:24   Ct Abdomen Pelvis W Contrast  Result Date: 05/31/2019 CLINICAL DATA:  Abdominal pain for 4 hours EXAM: CT ABDOMEN AND PELVIS WITH CONTRAST TECHNIQUE: Multidetector CT imaging of the abdomen and pelvis was performed using the standard protocol following bolus administration of intravenous contrast. CONTRAST:  36m OMNIPAQUE IOHEXOL 300 MG/ML  SOLN COMPARISON:  MRI May 27, 2019, CT May 04, 2019 FINDINGS: Lower chest: There is a large paraesophageal hernia present. There is mild cardiomegaly. The visualized portions of the lungs are clear. Hepatobiliary: The liver is normal in density without focal abnormality.The main portal vein is patent. The patient is status post cholecystectomy. No biliary ductal dilation. Again noted is mild dilatation of the common bile duct. Pancreas: Unremarkable. No pancreatic ductal dilatation or surrounding inflammatory changes. Spleen: Normal in size. There is an unchanged 3 cm low-density lesion seen within the splenic dome. Adrenals/Urinary Tract: Both adrenal glands appear normal. Low-density lesions seen within both kidneys, shown to be renal cysts on recent MRI. There is also a tiny fat containing lesion in the upper pole the left kidney, consistent with angiomyolipoma Stomach/Bowel: The small bowel is unremarkable. There is a focal segment of colon within the splenic flexure and descending colon that appears to have diffuse bowel wall thickening with surrounding mesenteric fat stranding changesscattered colonic diverticula are noted within the descending colon. There is a moderate amount of colonic stool. No pericolonic free fluid or Vascular/Lymphatic: There are no enlarged mesenteric, retroperitoneal, or pelvic lymph nodes. Scattered aortic atherosclerotic calcifications are seen without  aneurysmal dilatation. Reproductive: Again noted within the left adnexa a 4 cm low-density lesion which appears to be present and stable since April 2020. The lesion was present dating back to 2017, however minimally enlarged since that exam. Other: A small fat containing anterior umbilical hernia seen. Musculoskeletal: No acute or significant osseous findings. Chronic superior compression deformity of the L4 vertebral bodies seen with 50% loss in vertebral body height. IMPRESSION: 1. 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 No pericolonic abscess or free air. 2. 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 3. Large paraesophageal hernia. 4. Stable 4 cm left adnexal cyst. 5.  Aortic Atherosclerosis (ICD10-I70.0). Electronically Signed   By: BPrudencio PairM.D.   On: 05/31/2019 02:08   Dg Chest Port 1 View  Result Date: 05/04/2019 CLINICAL DATA:  Nausea vomiting abdominal pain EXAM: PORTABLE CHEST 1 VIEW COMPARISON:  April 17, 2016 FINDINGS: There is mild cardiomegaly. Aortic knob calcifications. There is prominence to the pulmonary vasculature. Air fluid-filled hiatal hernia is present. Degenerative changes in bilateral shoulders. IMPRESSION: Mild cardiomegaly.  Pulmonary vascular congestion. Large hiatal hernia. Electronically Signed   By: BPrudencio PairM.D.   On: 05/04/2019 17:29  [2 weeks]   Assessment: Pleasant 78year old female with intermittent abdominal pain associated with nausea/vomiting and diarrhea.  She was admitted with similar symptoms back in September.   Left-sided colitis: Present on CT in 11/2018 and probably back to 2017 as well. 04/2019 inflammatory changes mostly involved sigmoid colon. Current CT showing inflammation mostly around the splenic flexure and descending colon.  Concerning for ischemia although infection remains a possibility, given recent antibiotic exposure would be concerned about C. Difficile.  If stools are  negative, consider flexible sigmoidoscopy.  She had a complete colonoscopy March 2014 in [place], complicated by aspiration and hospitalization.  Elevated transaminases.  They were normal 2 days ago.  Bump to AST of 313 and ALT of 530 yesterday.  She had mild elevated transaminases during her recent hospitalization as well.  CT and MR negative for choledocholithiasis.  She has a several year history of intermittent right-sided abdominal pain.  In 2016 she had elevated lipase but CT was unremarkable.  Bile duct mildly dilated which is not unexpected given prior cholecystectomy and her age.  Cannot exclude ischemic hepatitis given hypotension on admission.  Other possibilities include microlithiasis, sphincter of Oddi dysfunction.  Her transaminases are improving today.  Large hiatal hernia.  She is at risk for aspiration.  Advised to keep head of bed elevated 30 degrees at all times.  Anemia.  Hemoccult negative.  B12 and folate normal.  Chronic splenic lesion, most c/w lymphangioma on recent MRI.  Cystic pancreatic body lesion noted on MRI, follow-up MRI in 2 years recommended.  Most commonly a sidebranch IPMN.  Leukocytosis: 32,900 on admission, down to 11,900 today.  Plan: 1. Stool studies. 2. If stool studies negative, consider flexible sigmoidoscopy to evaluate persistent left sided colon abnormalities seen on multiple CTs. 3. Continue empiric antibiotic therapy. 4. Follow LFTs. 5. Patient is now local and wants to continue her GI care at [facility]. She plans to cancel upcoming appt at [facility].   Laureen Ochs. Bernarda Caffey [facility] [phone] 10/22/20202:14 PM     LOS: 1 day

## 2019-06-02 NOTE — Progress Notes (Signed)
Patient had no bowel movements during my shift. Patient offers no complaints. GI made aware that she reported formed stool during previous shift but no BMs during this shift.

## 2019-06-02 NOTE — Progress Notes (Signed)
Patient Demographics:    Tamara Stein, is a 78 y.o. female, DOB - 1940/09/13, SXJ:[id number]  Admit date - 05/30/2019   Admitting Physician Oswald Hillock, MD  Outpatient Primary MD for the patient is Janora Norlander, DO  LOS - 1   Chief Complaint  Patient presents with   Abdominal Pain        Subjective:    Tamara Stein today has no fevers,  -No further diarrhea -Abdominal pain and nausea noted, no emesis  Assessment  & Plan :    Active Problems:   Colitis   Symptomatic anemia   Abnormal LFTs  Brief summary -78 y.o. female with a past medical history of anxiety, depression, pretension, chronic kidney disease stage II, spinal cerebellar degeneration, hypothyroidism and diverticulitis admitted on 05/31/2019 with abdominal pain and elevated LFTs and CT abdomen findings of colitis   A/p 1) splenic flexure and proximal descending colitis --- CT abdomen and pelvis findings noted, patient is allergic to quinolones and penicillins, - continue Rocephin and Flagyl,  -GI consult appreciated -May need flexible sigmoidoscopy -WBC down to 11.9K from 32.9K -Abdominal pain persist -continue full liquid diet -Unable to obtain stool studies as patient in the longer has diarrhea  2)Elevated LFTs--- review of records shows intermittent LFTs elevation in the past--Had imaging work-up previously, -Discussed with Dr. Laural Golden from GI service, -ALT is down to 367 from 530 and AST is down to 137 from 313 -Stop Lipitor -Patient is status post prior cholecystectomy -Acute viral hepatitis profile is negative  3) CKD stage - III, suspect worsening renal function due to dehydration, E. coli UTI and soft BP,   creatinine on admission= 1.45 ,   baseline creatinine = 1.3 to 1.4    , creatinine is now= 0.99     , renally adjust medications, avoid nephrotoxic agents / dehydration /hypotension  4)E coli  UTI--continue IV Rocephin per sensitivity report  5)Depression and anxiety-continue Cymbalta 60 mg daily  6)HTN--continue amlodipine 10 mg daily  7) acute on chronic chronic anemia-Hemoccult blood negative, baseline hemoglobin recently around 11, admission hemoglobin 11.8, hemoglobin down to 8.6 at this time after hydration -Continue to monitor closely, no evidence of ongoing bleeding at this time elevated MCV noted  8)hypothyroidism-continue levothyroxine 50 mcg daily  9)GERD/Hiatal Hernia- continue Protonix  Disposition/Need for in-Hospital Stay- patient unable to be discharged at this time due to -colitis and E. coli UTI requiring IV antibiotics  Code Status : DNR  Family Communication:   (patient is alert, awake and coherent)  Disposition Plan  : TBD  Consults  :  Gi  DVT Prophylaxis  :  Lovenox - SCDs   Lab Results  Component Value Date   PLT 153 06/02/2019    Inpatient Medications  Scheduled Meds:  amLODipine  10 mg Oral q morning - 10a   aspirin EC  81 mg Oral q morning - 10a   atorvastatin  40 mg Oral QHS   DULoxetine  60 mg Oral q morning - 10a   enoxaparin (LOVENOX) injection  40 mg Subcutaneous Q24H   levothyroxine  50 mcg Oral QAC breakfast   loratadine  10 mg Oral Daily   pantoprazole  40 mg Oral Daily   raloxifene  60 mg  Oral q morning - 10a   sodium chloride flush  3 mL Intravenous Once   Continuous Infusions:  sodium chloride 100 mL/hr at 06/01/19 0916   cefTRIAXone (ROCEPHIN)  IV 2 g (06/02/19 0549)   metronidazole 500 mg (06/02/19 1336)   PRN Meds:.acetaminophen **OR** acetaminophen, labetalol, meclizine, ondansetron **OR** ondansetron (ZOFRAN) IV    Anti-infectives (From admission, onward)   Start     Dose/Rate Route Frequency Ordered Stop   06/01/19 0600  cefTRIAXone (ROCEPHIN) 2 g in sodium chloride 0.9 % 100 mL IVPB     2 g 200 mL/hr over 30 Minutes Intravenous Every 24 hours 05/31/19 0926     06/01/19 0500  cefTRIAXone  (ROCEPHIN) 1 g in sodium chloride 0.9 % 100 mL IVPB  Status:  Discontinued     1 g 200 mL/hr over 30 Minutes Intravenous Every 24 hours 05/31/19 0921 05/31/19 0926   05/31/19 1200  metroNIDAZOLE (FLAGYL) IVPB 500 mg     500 mg 100 mL/hr over 60 Minutes Intravenous Every 8 hours 05/31/19 0921     05/31/19 0300  cefTRIAXone (ROCEPHIN) 1 g in sodium chloride 0.9 % 100 mL IVPB     1 g 200 mL/hr over 30 Minutes Intravenous  Once 05/31/19 0250 05/31/19 0437   05/31/19 0300  metroNIDAZOLE (FLAGYL) IVPB 500 mg     500 mg 100 mL/hr over 60 Minutes Intravenous  Once 05/31/19 0250 05/31/19 0545        Objective:   Vitals:   06/01/19 1427 06/01/19 2142 06/02/19 0516 06/02/19 1357  BP: 135/65 (!) 130/56 (!) 162/67 (!) 154/83  Pulse: 88 95 (!) 102 97  Resp: 20 17 17 20   Temp: 98.3 F (36.8 C) 98.2 F (36.8 C) 98.1 F (36.7 C) 98.3 F (36.8 C)  TempSrc: Oral Oral  Oral  SpO2: 96% 96% 98% 98%  Weight:      Height:        Wt Readings from Last 3 Encounters:  05/31/19 86.3 kg  05/07/19 87 kg  11/14/18 90.7 kg     Intake/Output Summary (Last 24 hours) at 06/02/2019 1852 Last data filed at 06/02/2019 1300 Gross per 24 hour  Intake 135 ml  Output 150 ml  Net -15 ml     Physical Exam  Gen:- Awake Alert,   HEENT:- [place].AT, No sclera icterus Neck-Supple Neck,No JVD,.  Lungs-  CTAB , fair symmetrical air movement CV- S1, S2 normal, regular  Abd-  +ve B.Sounds, Abd Soft, left-sided abdominal tenderness, no CVA tenderness,    Extremity/Skin:- pedal pulses present  Psych-affect is appropriate, oriented x3 Neuro-generalized weakness, no new focal deficits, no tremors   Data Review:   Micro Results Recent Results (from the past 240 hour(s))  Culture, Urine     Status: Abnormal   Collection Time: 05/31/19  2:16 AM   Specimen: Urine, Clean Catch  Result Value Ref Range Status   Specimen Description   Final    URINE, CLEAN CATCH Performed at [facility], [address]  [place], [place] [address]    Special Requests   Final    NONE Performed at [facility], [address] [place], [place] [address]    Culture >=100,000 COLONIES/mL ESCHERICHIA COLI (A)  Final   Report Status 06/02/2019 FINAL  Final   Organism ID, Bacteria ESCHERICHIA COLI (A)  Final      Susceptibility   Escherichia coli - MIC*    AMPICILLIN 8 SENSITIVE Sensitive     CEFAZOLIN <=4 SENSITIVE  Sensitive     CEFTRIAXONE <=1 SENSITIVE Sensitive     CIPROFLOXACIN <=0.25 SENSITIVE Sensitive     GENTAMICIN <=1 SENSITIVE Sensitive     IMIPENEM <=0.25 SENSITIVE Sensitive     NITROFURANTOIN <=16 SENSITIVE Sensitive     TRIMETH/SULFA <=20 SENSITIVE Sensitive     AMPICILLIN/SULBACTAM 4 SENSITIVE Sensitive     PIP/TAZO <=4 SENSITIVE Sensitive     Extended ESBL NEGATIVE Sensitive     * >=100,000 COLONIES/mL ESCHERICHIA COLI  SARS CORONAVIRUS 2 (TAT 6-24 HRS) Nasopharyngeal Nasopharyngeal Swab     Status: None   Collection Time: 05/31/19  3:19 AM   Specimen: Nasopharyngeal Swab  Result Value Ref Range Status   SARS Coronavirus 2 NEGATIVE NEGATIVE Final    Comment: (NOTE) SARS-CoV-2 target nucleic acids are NOT DETECTED. The SARS-CoV-2 RNA is generally detectable in upper and lower respiratory specimens during the acute phase of infection. Negative results do not preclude SARS-CoV-2 infection, do not rule out co-infections with other pathogens, and should not be used as the sole basis for treatment or other patient management decisions. Negative results must be combined with clinical observations, patient history, and epidemiological information. The expected result is Negative. Fact Sheet for Patients: SugarRoll.be Fact Sheet for Healthcare Providers: https://www.woods-mathews.com/ This test is not yet approved or cleared by the [place] FDA and  has been authorized for detection and/or diagnosis of SARS-CoV-2 by FDA under an Emergency Use  Authorization (EUA). This EUA will remain  in effect (meaning this test can be used) for the duration of the COVID-19 declaration under Section 56 4(b)(1) of the Act, 21 U.S.C. section 360bbb-3(b)(1), unless the authorization is terminated or revoked sooner. Performed at [facility], [place] [address], [place], [place] [address]     Radiology Reports Ct Abdomen Pelvis Wo Contrast  Result Date: 05/04/2019 CLINICAL DATA:  Abdominal pain.  Diarrhea. EXAM: CT ABDOMEN AND PELVIS WITHOUT CONTRAST TECHNIQUE: Multidetector CT imaging of the abdomen and pelvis was performed following the standard protocol without IV contrast. COMPARISON:  CT 11/14/2018 FINDINGS: Lower chest: Lung bases are clear.  Large hiatal hernia. Hepatobiliary: Gallbladder is absent. Normal appearance of the liver. Extrahepatic bile duct appears to be prominent and similar to the previous examination. Pancreas: Unremarkable. No pancreatic ductal dilatation or surrounding inflammatory changes. Spleen: Again noted is a low-density lesion involving the superior aspect of the spleen. This lesion roughly measures 3.0 x 2.6 x 2.3 cm. Lesion measured 3.0 x 2.5 x 1.4 cm in April 2020. Lesion was probably present in 2017 but clearly much smaller. Adrenals/Urinary Tract: Normal adrenal glands. Low-density renal structures probably represent small cysts. No hydronephrosis. Urinary bladder is unremarkable. Negative for kidney stones. Stomach/Bowel: Large hiatal hernia. Normal appearance of the duodenum. Mild inflammatory changes around the sigmoid colon and there are few colonic diverticula involving the sigmoid colon. Mild pericolonic edema in left lower quadrant involving the proximal sigmoid colon and distal descending colon. Mild distention of the cecum is similar to the previous examination. No small bowel dilatation. Vascular/Lymphatic: Atherosclerotic calcifications in the aorta and iliac arteries without aneurysm. No significant lymph node  enlargement in the abdomen or pelvis. Reproductive: Again noted is a low-density structure involving the left adnexa measuring roughly 4.2 cm and stable from the exam in April 2020 and minimally enlarged since 2017. Uterus has been removed. Other: Small amount of fluid in the pelvis.  Negative for free air. Musculoskeletal: Chronic compression deformity involving superior endplate of L4. Facet arthropathy in the lumbar spine.  IMPRESSION: 1. Trace free fluid in the pelvis and concern for mild inflammatory changes around the sigmoid colon. There are colonic diverticula in this area. Unfortunately, there is significant motion artifact in this area that limits evaluation of the pelvis. Cannot exclude diverticulitis involving the sigmoid colon. No evidence for an abscess collection. 2. Low-density splenic lesion is nonspecific but could be cystic. Lesion may have slightly enlarged since April 2020 but clearly enlarged since 2017. Based on the evidence for enlargement since 2017, consider further characterization of the splenic lesion with MRI or follow-up CT in 3-6 months to ensure stability. 3. Minimal change in the low-density left adnexal lesion. This probably represents a cystic structure and favor a benign etiology based on the minimal change since 2017. 4. Large hiatal hernia. 5. Probable bilateral renal cysts. Electronically Signed   By: Markus Daft M.D.   On: 05/04/2019 19:19   Mr Abdomen W Wo Contrast  Result Date: 05/27/2019 CLINICAL DATA:  Enlarging splenic lesion on CT. EXAM: MRI ABDOMEN WITHOUT AND WITH CONTRAST TECHNIQUE: Multiplanar multisequence MR imaging of the abdomen was performed both before and after the administration of intravenous contrast. CONTRAST:  28m GADAVIST GADOBUTROL 1 MMOL/ML IV SOLN COMPARISON:  05/04/2019 CT abdomen/pelvis. FINDINGS: Lower chest: No acute abnormality at the lung bases. Hepatobiliary: Normal liver size and configuration. No hepatic steatosis. Segment 4A left liver  dome simple subcentimeter cyst. No additional liver lesions. Cholecystectomy. Bile ducts are within normal post cholecystectomy limits. Common bile duct diameter 8 mm. No evidence of choledocholithiasis, biliary strictures or beading. Pancreas: There is a 0.7 cm cystic pancreatic body lesion (series 9/image 24) without wall thickening, convincing enhancement or thick septations. No additional pancreatic lesions. No pancreatic duct dilation. No definite pancreas divisum. Spleen: Normal size spleen. There is a 3.1 x 2.8 cm cystic splenic mass (series 9/image 7) with a few thin internal septations, without wall thickening, solid enhancement or thickened septations, compatible with a splenic lymphangioma. No additional splenic lesions. Adrenals/Urinary Tract: Normal adrenals. No hydronephrosis. Several simple renal cysts scattered throughout both kidneys, largest 1.6 cm in the upper right kidney and 1.3 cm in the upper left kidney. 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 Stomach/Bowel: Large hiatal hernia. Otherwise normal nondistended stomach. Visualized small and large bowel is normal caliber, with no bowel wall thickening. Vascular/Lymphatic: Atherosclerotic nonaneurysmal abdominal aorta. Patent portal, splenic, hepatic and renal veins. No pathologically enlarged lymph nodes in the abdomen. Other: No abdominal ascites or focal fluid collection. Musculoskeletal: No aggressive appearing focal osseous lesions. 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 2. Tiny 0.7 cm cystic pancreatic body lesion without high risk MRI features, most commonly a side branch IPMN. No biliary or pancreatic duct dilation. Follow-up MRI abdomen without and with IV contrast recommended in 2 years. This recommendation follows ACR consensus guidelines: Management of Incidental Pancreatic Cysts: A White Paper of the ACR Incidental Findings Committee. J Am Coll Radiol  [id number] 3. 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 4. Large hiatal hernia. 5.  Aortic Atherosclerosis (ICD10-I70.0). Electronically Signed   By: JIlona SorrelM.D.   On: 05/27/2019 13:24   Ct Abdomen Pelvis W Contrast  Result Date: 05/31/2019 CLINICAL DATA:  Abdominal pain for 4 hours EXAM: CT ABDOMEN AND PELVIS WITH CONTRAST TECHNIQUE: Multidetector CT imaging of the abdomen and pelvis was performed using the standard protocol following bolus administration of intravenous contrast. CONTRAST:  727mOMNIPAQUE IOHEXOL 300 MG/ML  SOLN COMPARISON:  MRI May 27, 2019, CT May 04, 2019 FINDINGS: Lower chest: There is a large paraesophageal hernia present. There is mild cardiomegaly. The visualized portions of the lungs are clear. Hepatobiliary: The liver is normal in density without focal abnormality.The main portal vein is patent. The patient is status post cholecystectomy. No biliary ductal dilation. Again noted is mild dilatation of the common bile duct. Pancreas: Unremarkable. No pancreatic ductal dilatation or surrounding inflammatory changes. Spleen: Normal in size. There is an unchanged 3 cm low-density lesion seen within the splenic dome. Adrenals/Urinary Tract: Both adrenal glands appear normal. Low-density lesions seen within both kidneys, shown to be renal cysts on recent MRI. There is also a tiny fat containing lesion in the upper pole the left kidney, consistent with angiomyolipoma Stomach/Bowel: The small bowel is unremarkable. There is a focal segment of colon within the splenic flexure and descending colon that appears to have diffuse bowel wall thickening with surrounding mesenteric fat stranding changesscattered colonic diverticula are noted within the descending colon. There is a moderate amount of colonic stool. No pericolonic free fluid or Vascular/Lymphatic: There are no enlarged mesenteric, retroperitoneal, or pelvic lymph nodes. Scattered aortic  atherosclerotic calcifications are seen without aneurysmal dilatation. Reproductive: Again noted within the left adnexa a 4 cm low-density lesion which appears to be present and stable since April 2020. The lesion was present dating back to 2017, however minimally enlarged since that exam. Other: A small fat containing anterior umbilical hernia seen. Musculoskeletal: No acute or significant osseous findings. Chronic superior compression deformity of the L4 vertebral bodies seen with 50% loss in vertebral body height. IMPRESSION: 1. 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 No pericolonic abscess or free air. 2. 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 3. Large paraesophageal hernia. 4. Stable 4 cm left adnexal cyst. 5.  Aortic Atherosclerosis (ICD10-I70.0). Electronically Signed   By: Prudencio Pair M.D.   On: 05/31/2019 02:08   Dg Chest Port 1 View  Result Date: 05/04/2019 CLINICAL DATA:  Nausea vomiting abdominal pain EXAM: PORTABLE CHEST 1 VIEW COMPARISON:  April 17, 2016 FINDINGS: There is mild cardiomegaly. Aortic knob calcifications. There is prominence to the pulmonary vasculature. Air fluid-filled hiatal hernia is present. Degenerative changes in bilateral shoulders. IMPRESSION: Mild cardiomegaly.  Pulmonary vascular congestion. Large hiatal hernia. Electronically Signed   By: Prudencio Pair M.D.   On: 05/04/2019 17:29     CBC Recent Labs  Lab 05/30/19 2305 05/31/19 0951 06/01/19 0451 06/02/19 0451  WBC 14.4* 32.9* 22.7* 11.9*  HGB 11.8* 9.8* 8.6* 9.0*  HCT 38.9 32.8* 28.5* 29.9*  PLT 222 184 142* 153  MCV 100.8* 101.2* 102.2* 101.4*  MCH 30.6 30.2 30.8 30.5  MCHC 30.3 29.9* 30.2 30.1  RDW 14.5 14.6 14.9 14.8    Chemistries  Recent Labs  Lab 05/30/19 2305 05/31/19 0951 06/01/19 0451 06/02/19 0451  NA 143  --  144 144  K 4.0  --  4.2 3.5  CL 112*  --  117* 116*  CO2 21*  --  21* 20*  GLUCOSE 153*  --  97 112*    BUN 39*  --  33* 22  CREATININE 1.42* 1.29* 1.26* 0.99  CALCIUM 8.7*  --  7.6* 7.8*  AST 24  --  313* 137*  ALT 37  --  530* 367*  ALKPHOS 84  --  88 87  BILITOT 0.3  --  0.6 0.4   ------------------------------------------------------------------------------------------------------------------ No results for input(s):  CHOL, HDL, LDLCALC, TRIG, CHOLHDL, LDLDIRECT in the last 72 hours.  Lab Results  Component Value Date   HGBA1C 5.2 05/09/2016   ------------------------------------------------------------------------------------------------------------------ No results for input(s): TSH, T4TOTAL, T3FREE, THYROIDAB in the last 72 hours.  Invalid input(s): FREET3 ------------------------------------------------------------------------------------------------------------------ Recent Labs    06/02/19 0451  VITAMINB12 676  FOLATE 26.9    Coagulation profile No results for input(s): INR, PROTIME in the last 168 hours.  No results for input(s): DDIMER in the last 72 hours.  Cardiac Enzymes No results for input(s): CKMB, TROPONINI, MYOGLOBIN in the last 168 hours.  Invalid input(s): CK ------------------------------------------------------------------------------------------------------------------    Component Value Date/Time   BNP 15.9 01/21/2015 1000     Roxan Hockey M.D on 06/02/2019 at 6:52 PM  Go to [email or web address] - for contact info  [facility] - Office  [phone]

## 2019-06-03 DIAGNOSIS — A09 Infectious gastroenteritis and colitis, unspecified: Principal | ICD-10-CM

## 2019-06-03 DIAGNOSIS — D649 Anemia, unspecified: Secondary | ICD-10-CM | POA: Diagnosis not present

## 2019-06-03 DIAGNOSIS — R197 Diarrhea, unspecified: Secondary | ICD-10-CM | POA: Diagnosis not present

## 2019-06-03 DIAGNOSIS — K529 Noninfective gastroenteritis and colitis, unspecified: Secondary | ICD-10-CM | POA: Diagnosis not present

## 2019-06-03 DIAGNOSIS — R945 Abnormal results of liver function studies: Secondary | ICD-10-CM | POA: Diagnosis not present

## 2019-06-03 LAB — HEPATIC FUNCTION PANEL
ALT: 251 U/L — ABNORMAL HIGH (ref 0–44)
ALT: 207 U/L — ABNORMAL HIGH (ref 0–44)
AST: 61 U/L — ABNORMAL HIGH (ref 15–41)
AST: 43 U/L — ABNORMAL HIGH (ref 15–41)
Albumin: 3.1 g/dL — ABNORMAL LOW (ref 3.5–5.0)
Albumin: 2.9 g/dL — ABNORMAL LOW (ref 3.5–5.0)
Alkaline Phosphatase: 97 U/L (ref 38–126)
Alkaline Phosphatase: 78 U/L (ref 38–126)
Bilirubin, Direct: 0.1 mg/dL (ref 0.0–0.2)
Bilirubin, Direct: 0.1 mg/dL (ref 0.0–0.2)
Indirect Bilirubin: 0.3 mg/dL (ref 0.3–0.9)
Indirect Bilirubin: 0.1 mg/dL — ABNORMAL LOW (ref 0.3–0.9)
Total Bilirubin: 0.4 mg/dL (ref 0.3–1.2)
Total Bilirubin: 0.2 mg/dL — ABNORMAL LOW (ref 0.3–1.2)
Total Protein: 5.6 g/dL — ABNORMAL LOW (ref 6.5–8.1)
Total Protein: 5.6 g/dL — ABNORMAL LOW (ref 6.5–8.1)

## 2019-06-03 LAB — CBC
HCT: 32.1 % — ABNORMAL LOW (ref 36.0–46.0)
Hemoglobin: 9.5 g/dL — ABNORMAL LOW (ref 12.0–15.0)
MCH: 30.3 pg (ref 26.0–34.0)
MCHC: 29.6 g/dL — ABNORMAL LOW (ref 30.0–36.0)
MCV: 102.2 fL — ABNORMAL HIGH (ref 80.0–100.0)
Platelets: 145 10*3/uL — ABNORMAL LOW (ref 150–400)
RBC: 3.14 MIL/uL — ABNORMAL LOW (ref 3.87–5.11)
RDW: 14.7 % (ref 11.5–15.5)
WBC: 9.1 10*3/uL (ref 4.0–10.5)
nRBC: 0 % (ref 0.0–0.2)

## 2019-06-03 MED ORDER — POTASSIUM CHLORIDE CRYS ER 20 MEQ PO TBCR
40.0000 meq | EXTENDED_RELEASE_TABLET | Freq: Once | ORAL | Status: AC
  Administered 2019-06-03: 40 meq via ORAL
  Filled 2019-06-03: qty 2

## 2019-06-03 NOTE — Progress Notes (Signed)
Patient Demographics:    Tamara Stein, is a 78 y.o. female, DOB - September 08, 1940, CHY:[id number]  Admit date - 05/30/2019   Admitting Physician Oswald Hillock, MD  Outpatient Primary MD for the patient is Janora Norlander, DO  LOS - 2   Chief Complaint  Patient presents with   Abdominal Pain        Subjective:    [name] today has no fevers,  -No further diarrhea -Abdominal pain and nausea noted, no emesis  Assessment  & Plan :    Active Problems:   Colitis   Symptomatic anemia   Abnormal LFTs   Diarrhea  Brief summary -78 y.o. female with a past medical history of anxiety, depression, pretension, chronic kidney disease stage II, spinal cerebellar degeneration, hypothyroidism and diverticulitis admitted on 05/31/2019 with abdominal pain and elevated LFTs and CT abdomen findings of colitis   A/p 1) splenic flexure and proximal descending colitis --- CT abdomen and pelvis findings noted, patient is allergic to quinolones and penicillins, - continue iv Rocephin and iv Flagyl,  -GI consult appreciated -Per GI service patient needs flexible sigmoidoscopy -Awaiting GI decision on possible flex sig as inpatient versus outpatient -WBC down to 9.1K from 32.9K -Abdominal pain improving -continue full liquid diet -Unable to obtain stool studies as patient in the longer has diarrhea  2)Elevated LFTs--- review of records shows intermittent LFTs elevation in the past--Had imaging work-up previously, -Discussed with Dr. Laural Golden from GI service, -ALT is down to 251 from 530 and AST is down to 61 from 313 -Stopped Lipitor -Patient is status post prior cholecystectomy -Acute viral hepatitis profile is negative  3) CKD stage - III, suspect worsening renal function due to dehydration, E. coli UTI and soft BP,   creatinine on admission= 1.45 ,   baseline creatinine = 1.3 to 1.4    , creatinine  is now= 0.99     , renally adjust medications, avoid nephrotoxic agents / dehydration /hypotension  4)E coli UTI--continue IV Rocephin per sensitivity report  5)Depression and anxiety-continue Cymbalta 60 mg daily  6)HTN--continue amlodipine 10 mg daily  7) acute on chronic chronic anemia-Hemoccult blood negative, baseline hemoglobin recently around 11, admission hemoglobin 11.8, hemoglobin down to 8.6 at this time after hydration -Continue to monitor closely, no evidence of ongoing bleeding at this time elevated MCV noted  8)hypothyroidism-continue levothyroxine 50 mcg daily  9)GERD/Hiatal Hernia- continue Protonix  Disposition/Need for in-Hospital Stay- patient unable to be discharged at this time due to -left-sided colitis and E. coli UTI requiring IV antibiotics  Code Status : DNR  Family Communication:   (patient is alert, awake and coherent)  Disposition Plan  : TBD  Consults  :  Gi  DVT Prophylaxis  :  Lovenox - SCDs   Lab Results  Component Value Date   PLT 145 (L) 06/03/2019    Inpatient Medications  Scheduled Meds:  amLODipine  10 mg Oral q morning - 10a   aspirin EC  81 mg Oral q morning - 10a   atorvastatin  40 mg Oral QHS   DULoxetine  60 mg Oral q morning - 10a   enoxaparin (LOVENOX) injection  40 mg Subcutaneous Q24H   levothyroxine  50 mcg Oral QAC breakfast  loratadine  10 mg Oral Daily   pantoprazole  40 mg Oral Daily   raloxifene  60 mg Oral q morning - 10a   sodium chloride flush  3 mL Intravenous Once   Continuous Infusions:  sodium chloride 50 mL/hr at 06/03/19 0827   cefTRIAXone (ROCEPHIN)  IV 2 g (06/03/19 0432)   metronidazole 500 mg (06/03/19 1213)   PRN Meds:.acetaminophen **OR** acetaminophen, labetalol, meclizine, Melatonin, ondansetron **OR** ondansetron (ZOFRAN) IV    Anti-infectives (From admission, onward)   Start     Dose/Rate Route Frequency Ordered Stop   06/01/19 0600  cefTRIAXone (ROCEPHIN) 2 g in sodium  chloride 0.9 % 100 mL IVPB     2 g 200 mL/hr over 30 Minutes Intravenous Every 24 hours 05/31/19 0926     06/01/19 0500  cefTRIAXone (ROCEPHIN) 1 g in sodium chloride 0.9 % 100 mL IVPB  Status:  Discontinued     1 g 200 mL/hr over 30 Minutes Intravenous Every 24 hours 05/31/19 0921 05/31/19 0926   05/31/19 1200  metroNIDAZOLE (FLAGYL) IVPB 500 mg     500 mg 100 mL/hr over 60 Minutes Intravenous Every 8 hours 05/31/19 0921     05/31/19 0300  cefTRIAXone (ROCEPHIN) 1 g in sodium chloride 0.9 % 100 mL IVPB     1 g 200 mL/hr over 30 Minutes Intravenous  Once 05/31/19 0250 05/31/19 0437   05/31/19 0300  metroNIDAZOLE (FLAGYL) IVPB 500 mg     500 mg 100 mL/hr over 60 Minutes Intravenous  Once 05/31/19 0250 05/31/19 0545        Objective:   Vitals:   06/03/19 0418 06/03/19 0536 06/03/19 0700 06/03/19 1646  BP: (!) 178/94 (!) 145/70 (!) 155/72 (!) 132/57  Pulse: (!) 112 83 88 94  Resp: 20     Temp: 98.6 F (37 C)  98.8 F (37.1 C) 98.4 F (36.9 C)  TempSrc: Oral  Oral Oral  SpO2: 97%  96% 97%  Weight:      Height:        Wt Readings from Last 3 Encounters:  05/31/19 86.3 kg  05/07/19 87 kg  11/14/18 90.7 kg     Intake/Output Summary (Last 24 hours) at 06/03/2019 1810 Last data filed at 06/03/2019 1300 Gross per 24 hour  Intake 720 ml  Output 600 ml  Net 120 ml     Physical Exam  Gen:- Awake Alert,   HEENT:- [place].AT, No sclera icterus Neck-Supple Neck,No JVD,.  Lungs-  CTAB , fair symmetrical air movement CV- S1, S2 normal, regular  Abd-  +ve B.Sounds, Abd Soft, improving left-sided abdominal tenderness, no CVA tenderness,    Extremity/Skin:- pedal pulses present  Psych-affect is appropriate, oriented x3 Neuro-generalized weakness, no new focal deficits, no tremors   Data Review:   Micro Results Recent Results (from the past 240 hour(s))  Culture, Urine     Status: Abnormal   Collection Time: 05/31/19  2:16 AM   Specimen: Urine, Clean Catch  Result Value Ref  Range Status   Specimen Description   Final    URINE, CLEAN CATCH Performed at [facility], [address] [place], [place] [address]    Special Requests   Final    NONE Performed at [facility], [address] [place], [place] [address]    Culture >=100,000 COLONIES/mL ESCHERICHIA COLI (A)  Final   Report Status 06/02/2019 FINAL  Final   Organism ID, Bacteria ESCHERICHIA COLI (A)  Final      Susceptibility  Escherichia coli - MIC*    AMPICILLIN 8 SENSITIVE Sensitive     CEFAZOLIN <=4 SENSITIVE Sensitive     CEFTRIAXONE <=1 SENSITIVE Sensitive     CIPROFLOXACIN <=0.25 SENSITIVE Sensitive     GENTAMICIN <=1 SENSITIVE Sensitive     IMIPENEM <=0.25 SENSITIVE Sensitive     NITROFURANTOIN <=16 SENSITIVE Sensitive     TRIMETH/SULFA <=20 SENSITIVE Sensitive     AMPICILLIN/SULBACTAM 4 SENSITIVE Sensitive     PIP/TAZO <=4 SENSITIVE Sensitive     Extended ESBL NEGATIVE Sensitive     * >=100,000 COLONIES/mL ESCHERICHIA COLI  SARS CORONAVIRUS 2 (TAT 6-24 HRS) Nasopharyngeal Nasopharyngeal Swab     Status: None   Collection Time: 05/31/19  3:19 AM   Specimen: Nasopharyngeal Swab  Result Value Ref Range Status   SARS Coronavirus 2 NEGATIVE NEGATIVE Final    Comment: (NOTE) SARS-CoV-2 target nucleic acids are NOT DETECTED. The SARS-CoV-2 RNA is generally detectable in upper and lower respiratory specimens during the acute phase of infection. Negative results do not preclude SARS-CoV-2 infection, do not rule out co-infections with other pathogens, and should not be used as the sole basis for treatment or other patient management decisions. Negative results must be combined with clinical observations, patient history, and epidemiological information. The expected result is Negative. Fact Sheet for Patients: SugarRoll.be Fact Sheet for Healthcare Providers: https://www.woods-mathews.com/ This test is not yet approved or cleared by the [place] FDA and  has been authorized for detection and/or diagnosis of SARS-CoV-2 by FDA under an Emergency Use Authorization (EUA). This EUA will remain  in effect (meaning this test can be used) for the duration of the COVID-19 declaration under Section 56 4(b)(1) of the Act, 21 U.S.C. section 360bbb-3(b)(1), unless the authorization is terminated or revoked sooner. Performed at [facility], [place] [address], [place], [place] [address]     Radiology Reports Ct Abdomen Pelvis Wo Contrast  Result Date: 05/04/2019 CLINICAL DATA:  Abdominal pain.  Diarrhea. EXAM: CT ABDOMEN AND PELVIS WITHOUT CONTRAST TECHNIQUE: Multidetector CT imaging of the abdomen and pelvis was performed following the standard protocol without IV contrast. COMPARISON:  CT 11/14/2018 FINDINGS: Lower chest: Lung bases are clear.  Large hiatal hernia. Hepatobiliary: Gallbladder is absent. Normal appearance of the liver. Extrahepatic bile duct appears to be prominent and similar to the previous examination. Pancreas: Unremarkable. No pancreatic ductal dilatation or surrounding inflammatory changes. Spleen: Again noted is a low-density lesion involving the superior aspect of the spleen. This lesion roughly measures 3.0 x 2.6 x 2.3 cm. Lesion measured 3.0 x 2.5 x 1.4 cm in April 2020. Lesion was probably present in 2017 but clearly much smaller. Adrenals/Urinary Tract: Normal adrenal glands. Low-density renal structures probably represent small cysts. No hydronephrosis. Urinary bladder is unremarkable. Negative for kidney stones. Stomach/Bowel: Large hiatal hernia. Normal appearance of the duodenum. Mild inflammatory changes around the sigmoid colon and there are few colonic diverticula involving the sigmoid colon. Mild pericolonic edema in left lower quadrant involving the proximal sigmoid colon and distal descending colon. Mild distention of the cecum is similar to the previous examination. No small bowel dilatation.  Vascular/Lymphatic: Atherosclerotic calcifications in the aorta and iliac arteries without aneurysm. No significant lymph node enlargement in the abdomen or pelvis. Reproductive: Again noted is a low-density structure involving the left adnexa measuring roughly 4.2 cm and stable from the exam in April 2020 and minimally enlarged since 2017. Uterus has been removed. Other: Small amount of fluid in the pelvis.  Negative  for free air. Musculoskeletal: Chronic compression deformity involving superior endplate of L4. Facet arthropathy in the lumbar spine. IMPRESSION: 1. Trace free fluid in the pelvis and concern for mild inflammatory changes around the sigmoid colon. There are colonic diverticula in this area. Unfortunately, there is significant motion artifact in this area that limits evaluation of the pelvis. Cannot exclude diverticulitis involving the sigmoid colon. No evidence for an abscess collection. 2. Low-density splenic lesion is nonspecific but could be cystic. Lesion may have slightly enlarged since April 2020 but clearly enlarged since 2017. Based on the evidence for enlargement since 2017, consider further characterization of the splenic lesion with MRI or follow-up CT in 3-6 months to ensure stability. 3. Minimal change in the low-density left adnexal lesion. This probably represents a cystic structure and favor a benign etiology based on the minimal change since 2017. 4. Large hiatal hernia. 5. Probable bilateral renal cysts. Electronically Signed   By: Markus Daft M.D.   On: 05/04/2019 19:19   Mr Abdomen W Wo Contrast  Result Date: 05/27/2019 CLINICAL DATA:  Enlarging splenic lesion on CT. EXAM: MRI ABDOMEN WITHOUT AND WITH CONTRAST TECHNIQUE: Multiplanar multisequence MR imaging of the abdomen was performed both before and after the administration of intravenous contrast. CONTRAST:  55m GADAVIST GADOBUTROL 1 MMOL/ML IV SOLN COMPARISON:  05/04/2019 CT abdomen/pelvis. FINDINGS: Lower chest: No acute  abnormality at the lung bases. Hepatobiliary: Normal liver size and configuration. No hepatic steatosis. Segment 4A left liver dome simple subcentimeter cyst. No additional liver lesions. Cholecystectomy. Bile ducts are within normal post cholecystectomy limits. Common bile duct diameter 8 mm. No evidence of choledocholithiasis, biliary strictures or beading. Pancreas: There is a 0.7 cm cystic pancreatic body lesion (series 9/image 24) without wall thickening, convincing enhancement or thick septations. No additional pancreatic lesions. No pancreatic duct dilation. No definite pancreas divisum. Spleen: Normal size spleen. There is a 3.1 x 2.8 cm cystic splenic mass (series 9/image 7) with a few thin internal septations, without wall thickening, solid enhancement or thickened septations, compatible with a splenic lymphangioma. No additional splenic lesions. Adrenals/Urinary Tract: Normal adrenals. No hydronephrosis. Several simple renal cysts scattered throughout both kidneys, largest 1.6 cm in the upper right kidney and 1.3 cm in the upper left kidney. 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 Stomach/Bowel: Large hiatal hernia. Otherwise normal nondistended stomach. Visualized small and large bowel is normal caliber, with no bowel wall thickening. Vascular/Lymphatic: Atherosclerotic nonaneurysmal abdominal aorta. Patent portal, splenic, hepatic and renal veins. No pathologically enlarged lymph nodes in the abdomen. Other: No abdominal ascites or focal fluid collection. Musculoskeletal: No aggressive appearing focal osseous lesions. 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 2. Tiny 0.7 cm cystic pancreatic body lesion without high risk MRI features, most commonly a side branch IPMN. No biliary or pancreatic duct dilation. Follow-up MRI abdomen without and with IV contrast recommended in 2 years. This recommendation follows ACR consensus  guidelines: Management of Incidental Pancreatic Cysts: A White Paper of the ACR Incidental Findings Committee. J Am Coll Radiol 22595;[id number] 3. 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 4. Large hiatal hernia. 5.  Aortic Atherosclerosis (ICD10-I70.0). Electronically Signed   By: JIlona SorrelM.D.   On: 05/27/2019 13:24   Ct Abdomen Pelvis W Contrast  Result Date: 05/31/2019 CLINICAL DATA:  Abdominal pain for 4 hours EXAM: CT ABDOMEN AND PELVIS WITH CONTRAST TECHNIQUE: Multidetector CT imaging of the abdomen and pelvis was  performed using the standard protocol following bolus administration of intravenous contrast. CONTRAST:  34m OMNIPAQUE IOHEXOL 300 MG/ML  SOLN COMPARISON:  MRI May 27, 2019, CT May 04, 2019 FINDINGS: Lower chest: There is a large paraesophageal hernia present. There is mild cardiomegaly. The visualized portions of the lungs are clear. Hepatobiliary: The liver is normal in density without focal abnormality.The main portal vein is patent. The patient is status post cholecystectomy. No biliary ductal dilation. Again noted is mild dilatation of the common bile duct. Pancreas: Unremarkable. No pancreatic ductal dilatation or surrounding inflammatory changes. Spleen: Normal in size. There is an unchanged 3 cm low-density lesion seen within the splenic dome. Adrenals/Urinary Tract: Both adrenal glands appear normal. Low-density lesions seen within both kidneys, shown to be renal cysts on recent MRI. There is also a tiny fat containing lesion in the upper pole the left kidney, consistent with angiomyolipoma Stomach/Bowel: The small bowel is unremarkable. There is a focal segment of colon within the splenic flexure and descending colon that appears to have diffuse bowel wall thickening with surrounding mesenteric fat stranding changesscattered colonic diverticula are noted within the descending colon. There is a moderate amount of colonic stool. No pericolonic free  fluid or Vascular/Lymphatic: There are no enlarged mesenteric, retroperitoneal, or pelvic lymph nodes. Scattered aortic atherosclerotic calcifications are seen without aneurysmal dilatation. Reproductive: Again noted within the left adnexa a 4 cm low-density lesion which appears to be present and stable since April 2020. The lesion was present dating back to 2017, however minimally enlarged since that exam. Other: A small fat containing anterior umbilical hernia seen. Musculoskeletal: No acute or significant osseous findings. Chronic superior compression deformity of the L4 vertebral bodies seen with 50% loss in vertebral body height. IMPRESSION: 1. 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 No pericolonic abscess or free air. 2. 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 3. Large paraesophageal hernia. 4. Stable 4 cm left adnexal cyst. 5.  Aortic Atherosclerosis (ICD10-I70.0). Electronically Signed   By: BPrudencio PairM.D.   On: 05/31/2019 02:08     CBC Recent Labs  Lab 05/30/19 2305 05/31/19 0951 06/01/19 0451 06/02/19 0451 06/03/19 0458  WBC 14.4* 32.9* 22.7* 11.9* 9.1  HGB 11.8* 9.8* 8.6* 9.0* 9.5*  HCT 38.9 32.8* 28.5* 29.9* 32.1*  PLT 222 184 142* 153 145*  MCV 100.8* 101.2* 102.2* 101.4* 102.2*  MCH 30.6 30.2 30.8 30.5 30.3  MCHC 30.3 29.9* 30.2 30.1 29.6*  RDW 14.5 14.6 14.9 14.8 14.7    Chemistries  Recent Labs  Lab 05/30/19 2305 05/31/19 0951 06/01/19 0451 06/02/19 0451 06/03/19 0458  NA 143  --  144 144  --   K 4.0  --  4.2 3.5  --   CL 112*  --  117* 116*  --   CO2 21*  --  21* 20*  --   GLUCOSE 153*  --  97 112*  --   BUN 39*  --  33* 22  --   CREATININE 1.42* 1.29* 1.26* 0.99  --   CALCIUM 8.7*  --  7.6* 7.8*  --   AST 24  --  313* 137* 61*  ALT 37  --  530* 367* 251*  ALKPHOS 84  --  88 87 97  BILITOT 0.3  --  0.6 0.4 0.4    ------------------------------------------------------------------------------------------------------------------ No results for input(s): CHOL, HDL, LDLCALC, TRIG, CHOLHDL, LDLDIRECT in the last 72 hours.  Lab Results  Component Value Date  HGBA1C 5.2 05/09/2016   ------------------------------------------------------------------------------------------------------------------ No results for input(s): TSH, T4TOTAL, T3FREE, THYROIDAB in the last 72 hours.  Invalid input(s): FREET3 ------------------------------------------------------------------------------------------------------------------ Recent Labs    06/02/19 0451  VITAMINB12 676  FOLATE 26.9    Coagulation profile No results for input(s): INR, PROTIME in the last 168 hours.  No results for input(s): DDIMER in the last 72 hours.  Cardiac Enzymes No results for input(s): CKMB, TROPONINI, MYOGLOBIN in the last 168 hours.  Invalid input(s): CK ------------------------------------------------------------------------------------------------------------------    Component Value Date/Time   BNP 15.9 01/21/2015 1000     Roxan Hockey M.D on 06/03/2019 at 6:10 PM  Go to [email or web address] - for contact info  [facility] - Office  [phone]

## 2019-06-03 NOTE — Progress Notes (Signed)
Patient has had no bowel movements on this shift.

## 2019-06-03 NOTE — Progress Notes (Signed)
Subjective: Feels much better today. No BM last night or today but abdominal pain significantly improved. Denies N/V. No other GI complaints.  Objective: Vital signs in last 24 hours: Temp:  [98.3 F (36.8 C)-99.1 F (37.3 C)] 98.8 F (37.1 C) (10/23 0700) Pulse Rate:  [83-112] 88 (10/23 0700) Resp:  [20] 20 (10/23 0418) BP: (145-178)/(70-94) 155/72 (10/23 0700) SpO2:  [95 %-99 %] 96 % (10/23 0700) Last BM Date: 06/02/19 General:   Alert and oriented, pleasant Head:  Normocephalic and atraumatic. Eyes:  No icterus, sclera clear. Conjuctiva pink.  Heart:  S1, S2 present, no murmurs noted.  Lungs: Clear to auscultation bilaterally, without wheezing, rales, or rhonchi.  Abdomen:  Bowel sounds present, soft, non-distended. Minimal to no abdominal TTP. No HSM or hernias noted. No rebound or guarding. No masses appreciated  Msk:  Symmetrical without gross deformities. Pulses:  Normal bilateral DP pulses noted. Extremities:  Without clubbing or edema. Neurologic:  Alert and  oriented x4;  grossly normal neurologically. Skin:  Warm and dry, intact without significant lesions.  Psych:  Alert and cooperative. Normal mood and affect.  Intake/Output from previous day: 10/22 0701 - 10/23 0700 In: 375 [P.O.:375] Out: 600 [Urine:600] Intake/Output this shift: Total I/O In: 240 [P.O.:240] Out: -   Lab Results: Recent Labs    06/01/19 0451 06/02/19 0451 06/03/19 0458  WBC 22.7* 11.9* 9.1  HGB 8.6* 9.0* 9.5*  HCT 28.5* 29.9* 32.1*  PLT 142* 153 145*   BMET Recent Labs    06/01/19 0451 06/02/19 0451  NA 144 144  K 4.2 3.5  CL 117* 116*  CO2 21* 20*  GLUCOSE 97 112*  BUN 33* 22  CREATININE 1.26* 0.99  CALCIUM 7.6* 7.8*   LFT Recent Labs    06/01/19 0451 06/02/19 0451 06/03/19 0458  PROT 5.1* 5.5* 5.6*  ALBUMIN 2.7* 3.0* 3.1*  AST 313* 137* 61*  ALT 530* 367* 251*  ALKPHOS 88 87 97  BILITOT 0.6 0.4 0.4  BILIDIR  --   --  0.1  IBILI  --   --  0.3    PT/INR No results for input(s): LABPROT, INR in the last 72 hours. Hepatitis Panel Recent Labs    06/02/19 0451  HEPBSAG NON REACTIVE  HCVAB NON REACTIVE  HEPAIGM NON REACTIVE  HEPBIGM NON REACTIVE     Studies/Results: No results found.  Assessment: Pleasant 78 year old female with intermittent abdominal pain associated with nausea/vomiting and diarrhea.  She was admitted with similar symptoms back in September.   Left-sided colitis: Present on CT in 11/2018 and probably back to 2017 as well. 04/2019 inflammatory changes mostly involved sigmoid colon. Current CT showing inflammation mostly around the splenic flexure and descending colon.  Concerning for ischemia although infection remains a possibility, given recent antibiotic exposure would be concerned about C. Difficile.  If stools are negative, consider flexible sigmoidoscopy.  She had a complete colonoscopy March 2014 in [place], complicated by aspiration and hospitalization. However, she had a solid stool yesterday, no BM in the last 24 hours. Abdominal pain nearly resolved today. Stool studies not collected yet due to no appropriate specimen.  Elevated transaminases.  They were normal 2 days ago.  Bump to AST of 313 and ALT of 530 yesterday.  She had mild elevated transaminases during her recent hospitalization as well.  CT and MR negative for choledocholithiasis.  She has a several year history of intermittent right-sided abdominal pain.  In 2016 she had elevated lipase but CT was  unremarkable.  Bile duct mildly dilated which is not unexpected given prior cholecystectomy and her age.  Cannot exclude ischemic hepatitis given hypotension on admission.  Other possibilities include microlithiasis, sphincter of Oddi dysfunction.  Her transaminases continue to improve today (AST/ALT 61/251 compared to 137/367 yesterday, normal alk phos and bili).  Large hiatal hernia.  She is at risk for aspiration.  Advised to keep head of bed  elevated 30 degrees at all times.  Anemia.  Hemoccult negative.  B12 and folate normal.  Chronic splenic lesion, most c/w lymphangioma on recent MRI.  Cystic pancreatic body lesion noted on MRI, follow-up MRI in 2 years recommended.  Most commonly a sidebranch IPMN.  Leukocytosis: 32.9 on admission, down to 11.9 yesterday and normalized at 9.1 today.    Plan: 1. Will discuss with Dr. Gala Romney for flex sig this weekend versus outpatient 2. If liquid stool, collect stool studies 3. Continue empiric antibiotics 4. Continue to follow LFTs 5. Supportive measures  6. Will plan for outpatient hospital follow-up with our office   Thank you for allowing [place] to participate in the care of Tamara Stein  Walden Field, DNP, AGNP-C Adult & Gerontological Nurse Practitioner [facility]     LOS: 2 days    06/03/2019, 1:34 PM

## 2019-06-03 NOTE — Care Management Important Message (Signed)
Important Message  Patient Details  Name: Tamara Stein MRN: [id number] Date of Birth: September 24, 1940   Medicare Important Message Given:  Yes     [name] 06/03/2019, 12:02 PM

## 2019-06-04 DIAGNOSIS — K529 Noninfective gastroenteritis and colitis, unspecified: Secondary | ICD-10-CM | POA: Diagnosis not present

## 2019-06-04 LAB — CBC
HCT: 31.3 % — ABNORMAL LOW (ref 36.0–46.0)
Hemoglobin: 9.6 g/dL — ABNORMAL LOW (ref 12.0–15.0)
MCH: 30.7 pg (ref 26.0–34.0)
MCHC: 30.7 g/dL (ref 30.0–36.0)
MCV: 100 fL (ref 80.0–100.0)
Platelets: 155 10*3/uL (ref 150–400)
RBC: 3.13 MIL/uL — ABNORMAL LOW (ref 3.87–5.11)
RDW: 14.8 % (ref 11.5–15.5)
WBC: 7 10*3/uL (ref 4.0–10.5)
nRBC: 0 % (ref 0.0–0.2)

## 2019-06-04 LAB — COMPREHENSIVE METABOLIC PANEL
ALT: 178 U/L — ABNORMAL HIGH (ref 0–44)
AST: 33 U/L (ref 15–41)
Albumin: 3.1 g/dL — ABNORMAL LOW (ref 3.5–5.0)
Alkaline Phosphatase: 80 U/L (ref 38–126)
Anion gap: 9 (ref 5–15)
BUN: 9 mg/dL (ref 8–23)
CO2: 19 mmol/L — ABNORMAL LOW (ref 22–32)
Calcium: 8.3 mg/dL — ABNORMAL LOW (ref 8.9–10.3)
Chloride: 114 mmol/L — ABNORMAL HIGH (ref 98–111)
Creatinine, Ser: 0.83 mg/dL (ref 0.44–1.00)
GFR calc Af Amer: >60 mL/min (ref >60)
GFR calc non Af Amer: >60 mL/min (ref >60)
Glucose, Bld: 113 mg/dL — ABNORMAL HIGH (ref 70–99)
Potassium: 3.7 mmol/L (ref 3.5–5.1)
Sodium: 142 mmol/L (ref 135–145)
Total Bilirubin: 0.5 mg/dL (ref 0.3–1.2)
Total Protein: 5.8 g/dL — ABNORMAL LOW (ref 6.5–8.1)

## 2019-06-04 MED ORDER — ISOSORBIDE MONONITRATE ER 60 MG PO TB24
30.0000 mg | ORAL_TABLET | Freq: Every day | ORAL | Status: DC
  Administered 2019-06-04 – 2019-06-05 (×2): 30 mg via ORAL
  Filled 2019-06-04 (×2): qty 1

## 2019-06-04 MED ORDER — HYDRALAZINE HCL 25 MG PO TABS
50.0000 mg | ORAL_TABLET | Freq: Three times a day (TID) | ORAL | Status: DC
  Administered 2019-06-04 – 2019-06-05 (×3): 50 mg via ORAL
  Filled 2019-06-04 (×4): qty 2

## 2019-06-04 MED ORDER — ENOXAPARIN SODIUM 40 MG/0.4ML ~~LOC~~ SOLN: 40.0000 mg | SUBCUTANEOUS | Status: DC

## 2019-06-04 MED ORDER — BISACODYL 5 MG PO TBEC
5.0000 mg | DELAYED_RELEASE_TABLET | Freq: Once | ORAL | Status: AC
  Administered 2019-06-04: 5 mg via ORAL
  Filled 2019-06-04: qty 1

## 2019-06-04 MED ORDER — HYDRALAZINE HCL 25 MG PO TABS
25.0000 mg | ORAL_TABLET | Freq: Three times a day (TID) | ORAL | Status: DC
  Administered 2019-06-04: 25 mg via ORAL
  Filled 2019-06-04: qty 1

## 2019-06-04 MED ORDER — PEG 3350-KCL-NA BICARB-NACL 420 G PO SOLR
4000.0000 mL | Freq: Once | ORAL | Status: AC
  Administered 2019-06-04: 4000 mL via ORAL

## 2019-06-04 NOTE — Progress Notes (Addendum)
Patient Demographics:    Tamara Stein, is a 78 y.o. female, DOB - November 28, 1940, PPJ:[id number]  Admit date - 05/30/2019   Admitting Physician Oswald Hillock, MD  Outpatient Primary MD for the patient is Tamara Norlander, DO  LOS - 3   Chief Complaint  Patient presents with   Abdominal Pain        Subjective:    Tamara Stein today has no fevers,  -No further diarrhea -Abdominal pain and nausea persist, no emesis  Assessment  & Plan :    Active Problems:   Colitis   Symptomatic anemia   Abnormal LFTs   Diarrhea  Brief summary -78 y.o. female with a past medical history of anxiety, depression, pretension, chronic kidney disease stage II, spinal cerebellar degeneration, hypothyroidism and diverticulitis admitted on 05/31/2019 with abdominal pain and elevated LFTs and CT abdomen findings of colitis with concerns about possible ischemic colitis   A/p 1) splenic flexure and proximal descending colitis --- CT abdomen and pelvis findings noted, -Concerns about possible ischemic colitis -Patient is wheelchair-bound making it difficult for her to do colonoscopy prep at home, discussed with Dr. Laural Golden from GI service plan is to prep patient today with GoLYTELY and proceed with flexible Sigmoidoscopy on 06/05/2019 - continue iv Rocephin and iv Flagyl,  -GI consult appreciated ---WBC down to 7.0K from 32.9K -Abdominal pain has not resolved -continue liquid diet -Unable to obtain stool studies as patient in the longer has diarrhea  2)Elevated LFTs--- review of records shows intermittent LFTs elevation in the past--Had imaging work-up previously, -Discussed with Dr. Laural Golden from GI service, -ALT is down to 178 from 530 and AST is down to 33 from 313 -Stopped Lipitor -Patient is status post prior cholecystectomy -Acute viral hepatitis profile is negative  3) CKD stage - III, suspect worsening  renal function due to dehydration, E. coli UTI and soft BP,   creatinine on admission= 1.45 ,   baseline creatinine = 1.3 to 1.4    , creatinine is now= 0.83     , renally adjust medications, avoid nephrotoxic agents / dehydration /hypotension  4)E coli UTI--continue IV Rocephin per sensitivity report  5)Depression and anxiety-continue Cymbalta 60 mg daily  6)HTN--BP is not at goal, continue amlodipine 10 mg daily, add p.o. hydralazine/isosorbide combo, may use IV labetalol as needed  7) acute on chronic chronic anemia-Hemoccult blood negative, baseline hemoglobin recently around 11, admission hemoglobin 11.8, hemoglobin down to 9.6 at this time after hydration -Continue to monitor closely, no evidence of ongoing bleeding at this time elevated MCV noted  8)hypothyroidism-continue levothyroxine 50 mcg daily  9)GERD/Hiatal Hernia- continue Protonix  10) cerebellar degeneration--patient has significant challenges with mobility related activities of daily living, she is wheelchair-bound -PT eval appreciated  Disposition/Need for in-Hospital Stay- patient unable to be discharged at this time due to -left-sided colitis and E. coli UTI requiring IV antibiotics, for flexible sigmoidoscopy on 06/05/2019  Code Status : DNR  Family Communication:   (patient is alert, awake and coherent)  Disposition Plan  : TBD  Consults  :  Gi  DVT Prophylaxis  :  Lovenox - SCDs   Lab Results  Component Value Date   PLT 155 06/04/2019    Inpatient Medications  Scheduled  Meds:  amLODipine  10 mg Oral q morning - 10a   aspirin EC  81 mg Oral q morning - 10a   atorvastatin  40 mg Oral QHS   DULoxetine  60 mg Oral q morning - 10a   [START ON 06/07/2019] enoxaparin (LOVENOX) injection  40 mg Subcutaneous Q24H   hydrALAZINE  50 mg Oral TID   isosorbide mononitrate  30 mg Oral Daily   levothyroxine  50 mcg Oral QAC breakfast   loratadine  10 mg Oral Daily   pantoprazole  40 mg Oral Daily    polyethylene glycol-electrolytes  4,000 mL Oral Once   raloxifene  60 mg Oral q morning - 10a   sodium chloride flush  3 mL Intravenous Once   Continuous Infusions:  sodium chloride 50 mL/hr at 06/03/19 0827   cefTRIAXone (ROCEPHIN)  IV 2 g (06/04/19 0615)   metronidazole 500 mg (06/04/19 0405)   PRN Meds:.acetaminophen **OR** acetaminophen, labetalol, meclizine, Melatonin, ondansetron **OR** ondansetron (ZOFRAN) IV    Anti-infectives (From admission, onward)   Start     Dose/Rate Route Frequency Ordered Stop   06/01/19 0600  cefTRIAXone (ROCEPHIN) 2 g in sodium chloride 0.9 % 100 mL IVPB     2 g 200 mL/hr over 30 Minutes Intravenous Every 24 hours 05/31/19 0926     06/01/19 0500  cefTRIAXone (ROCEPHIN) 1 g in sodium chloride 0.9 % 100 mL IVPB  Status:  Discontinued     1 g 200 mL/hr over 30 Minutes Intravenous Every 24 hours 05/31/19 0921 05/31/19 0926   05/31/19 1200  metroNIDAZOLE (FLAGYL) IVPB 500 mg     500 mg 100 mL/hr over 60 Minutes Intravenous Every 8 hours 05/31/19 0921     05/31/19 0300  cefTRIAXone (ROCEPHIN) 1 g in sodium chloride 0.9 % 100 mL IVPB     1 g 200 mL/hr over 30 Minutes Intravenous  Once 05/31/19 0250 05/31/19 0437   05/31/19 0300  metroNIDAZOLE (FLAGYL) IVPB 500 mg     500 mg 100 mL/hr over 60 Minutes Intravenous  Once 05/31/19 0250 05/31/19 0545        Objective:   Vitals:   06/03/19 2106 06/04/19 0635 06/04/19 0842 06/04/19 1052  BP: (!) 167/92 (!) 175/83  (!) 184/93  Pulse: 96 95    Resp: 19 18    Temp: 99.2 F (37.3 C) 98.3 F (36.8 C)    TempSrc: Oral Oral    SpO2: 96% 95% 97%   Weight:      Height:        Wt Readings from Last 3 Encounters:  05/31/19 86.3 kg  05/07/19 87 kg  11/14/18 90.7 kg     Intake/Output Summary (Last 24 hours) at 06/04/2019 1329 Last data filed at 06/04/2019 0900 Gross per 24 hour  Intake 480 ml  Output 300 ml  Net 180 ml     Physical Exam  Gen:- Awake Alert,   HEENT:- [place].AT, No sclera  icterus Neck-Supple Neck,No JVD,.  Lungs-  CTAB , fair symmetrical air movement CV- S1, S2 normal, regular  Abd-  +ve B.Sounds, Abd Soft, improving left-sided abdominal tenderness, no CVA tenderness,    Extremity/Skin:- pedal pulses present  Psych-affect is appropriate, oriented x3 Neuro-generalized weakness, no new focal deficits, no tremors   Data Review:   Micro Results Recent Results (from the past 240 hour(s))  Culture, Urine     Status: Abnormal   Collection Time: 05/31/19  2:16 AM   Specimen: Urine, Clean Catch  Result Value Ref Range Status   Specimen Description   Final    URINE, CLEAN CATCH Performed at [facility], [address] [place], [place] [address]    Special Requests   Final    NONE Performed at [facility], [address], [place], [place] [address]    Culture >=100,000 COLONIES/mL ESCHERICHIA COLI (A)  Final   Report Status 06/02/2019 FINAL  Final   Organism ID, Bacteria ESCHERICHIA COLI (A)  Final      Susceptibility   Escherichia coli - MIC*    AMPICILLIN 8 SENSITIVE Sensitive     CEFAZOLIN <=4 SENSITIVE Sensitive     CEFTRIAXONE <=1 SENSITIVE Sensitive     CIPROFLOXACIN <=0.25 SENSITIVE Sensitive     GENTAMICIN <=1 SENSITIVE Sensitive     IMIPENEM <=0.25 SENSITIVE Sensitive     NITROFURANTOIN <=16 SENSITIVE Sensitive     TRIMETH/SULFA <=20 SENSITIVE Sensitive     AMPICILLIN/SULBACTAM 4 SENSITIVE Sensitive     PIP/TAZO <=4 SENSITIVE Sensitive     Extended ESBL NEGATIVE Sensitive     * >=100,000 COLONIES/mL ESCHERICHIA COLI  SARS CORONAVIRUS 2 (TAT 6-24 HRS) Nasopharyngeal Nasopharyngeal Swab     Status: None   Collection Time: 05/31/19  3:19 AM   Specimen: Nasopharyngeal Swab  Result Value Ref Range Status   SARS Coronavirus 2 NEGATIVE NEGATIVE Final    Comment: (NOTE) SARS-CoV-2 target nucleic acids are NOT DETECTED. The SARS-CoV-2 RNA is generally detectable in upper and lower respiratory specimens during the acute phase of infection.  Negative results do not preclude SARS-CoV-2 infection, do not rule out co-infections with other pathogens, and should not be used as the sole basis for treatment or other patient management decisions. Negative results must be combined with clinical observations, patient history, and epidemiological information. The expected result is Negative. Fact Sheet for Patients: SugarRoll.be Fact Sheet for Healthcare Providers: https://www.woods-mathews.com/ This test is not yet approved or cleared by the [place] FDA and  has been authorized for detection and/or diagnosis of SARS-CoV-2 by FDA under an Emergency Use Authorization (EUA). This EUA will remain  in effect (meaning this test can be used) for the duration of the COVID-19 declaration under Section 56 4(b)(1) of the Act, 21 U.S.C. section 360bbb-3(b)(1), unless the authorization is terminated or revoked sooner. Performed at [facility], [place] [address] [place], [place] [address]     Radiology Reports Mr Abdomen W Wo Contrast  Result Date: 05/27/2019 CLINICAL DATA:  Enlarging splenic lesion on CT. EXAM: MRI ABDOMEN WITHOUT AND WITH CONTRAST TECHNIQUE: Multiplanar multisequence MR imaging of the abdomen was performed both before and after the administration of intravenous contrast. CONTRAST:  60m GADAVIST GADOBUTROL 1 MMOL/ML IV SOLN COMPARISON:  05/04/2019 CT abdomen/pelvis. FINDINGS: Lower chest: No acute abnormality at the lung bases. Hepatobiliary: Normal liver size and configuration. No hepatic steatosis. Segment 4A left liver dome simple subcentimeter cyst. No additional liver lesions. Cholecystectomy. Bile ducts are within normal post cholecystectomy limits. Common bile duct diameter 8 mm. No evidence of choledocholithiasis, biliary strictures or beading. Pancreas: There is a 0.7 cm cystic pancreatic body lesion (series 9/image 24) without wall thickening, convincing enhancement or  thick septations. No additional pancreatic lesions. No pancreatic duct dilation. No definite pancreas divisum. Spleen: Normal size spleen. There is a 3.1 x 2.8 cm cystic splenic mass (series 9/image 7) with a few thin internal septations, without wall thickening, solid enhancement or thickened septations, compatible with a splenic lymphangioma. No additional splenic lesions. Adrenals/Urinary Tract:  Normal adrenals. No hydronephrosis. Several simple renal cysts scattered throughout both kidneys, largest 1.6 cm in the upper right kidney and 1.3 cm in the upper left kidney. 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 Stomach/Bowel: Large hiatal hernia. Otherwise normal nondistended stomach. Visualized small and large bowel is normal caliber, with no bowel wall thickening. Vascular/Lymphatic: Atherosclerotic nonaneurysmal abdominal aorta. Patent portal, splenic, hepatic and renal veins. No pathologically enlarged lymph nodes in the abdomen. Other: No abdominal ascites or focal fluid collection. Musculoskeletal: No aggressive appearing focal osseous lesions. 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 2. Tiny 0.7 cm cystic pancreatic body lesion without high risk MRI features, most commonly a side branch IPMN. No biliary or pancreatic duct dilation. Follow-up MRI abdomen without and with IV contrast recommended in 2 years. This recommendation follows ACR consensus guidelines: Management of Incidental Pancreatic Cysts: A White Paper of the ACR Incidental Findings Committee. J Am Coll Radiol 6295;28:413-244. 3. 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 4. Large hiatal hernia. 5.  Aortic Atherosclerosis (ICD10-I70.0). Electronically Signed   By: Ilona Sorrel M.D.   On: 05/27/2019 13:24   Ct Abdomen Pelvis W Contrast  Result Date: 05/31/2019 CLINICAL DATA:  Abdominal pain for 4 hours EXAM: CT ABDOMEN AND PELVIS WITH CONTRAST  TECHNIQUE: Multidetector CT imaging of the abdomen and pelvis was performed using the standard protocol following bolus administration of intravenous contrast. CONTRAST:  87m OMNIPAQUE IOHEXOL 300 MG/ML  SOLN COMPARISON:  MRI May 27, 2019, CT May 04, 2019 FINDINGS: Lower chest: There is a large paraesophageal hernia present. There is mild cardiomegaly. The visualized portions of the lungs are clear. Hepatobiliary: The liver is normal in density without focal abnormality.The main portal vein is patent. The patient is status post cholecystectomy. No biliary ductal dilation. Again noted is mild dilatation of the common bile duct. Pancreas: Unremarkable. No pancreatic ductal dilatation or surrounding inflammatory changes. Spleen: Normal in size. There is an unchanged 3 cm low-density lesion seen within the splenic dome. Adrenals/Urinary Tract: Both adrenal glands appear normal. Low-density lesions seen within both kidneys, shown to be renal cysts on recent MRI. There is also a tiny fat containing lesion in the upper pole the left kidney, consistent with angiomyolipoma Stomach/Bowel: The small bowel is unremarkable. There is a focal segment of colon within the splenic flexure and descending colon that appears to have diffuse bowel wall thickening with surrounding mesenteric fat stranding changesscattered colonic diverticula are noted within the descending colon. There is a moderate amount of colonic stool. No pericolonic free fluid or Vascular/Lymphatic: There are no enlarged mesenteric, retroperitoneal, or pelvic lymph nodes. Scattered aortic atherosclerotic calcifications are seen without aneurysmal dilatation. Reproductive: Again noted within the left adnexa a 4 cm low-density lesion which appears to be present and stable since April 2020. The lesion was present dating back to 2017, however minimally enlarged since that exam. Other: A small fat containing anterior umbilical hernia seen. Musculoskeletal: No  acute or significant osseous findings. Chronic superior compression deformity of the L4 vertebral bodies seen with 50% loss in vertebral body height. IMPRESSION: 1. 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 No pericolonic abscess or free air. 2. 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 3. Large paraesophageal hernia. 4. Stable 4 cm left adnexal cyst. 5.  Aortic Atherosclerosis (ICD10-I70.0). Electronically Signed   By: BPrudencio PairM.D.   On: 05/31/2019 02:08  CBC Recent Labs  Lab 05/31/19 0951 06/01/19 0451 06/02/19 0451 06/03/19 0458 06/04/19 0648  WBC 32.9* 22.7* 11.9* 9.1 7.0  HGB 9.8* 8.6* 9.0* 9.5* 9.6*  HCT 32.8* 28.5* 29.9* 32.1* 31.3*  PLT 184 142* 153 145* 155  MCV 101.2* 102.2* 101.4* 102.2* 100.0  MCH 30.2 30.8 30.5 30.3 30.7  MCHC 29.9* 30.2 30.1 29.6* 30.7  RDW 14.6 14.9 14.8 14.7 14.8    Chemistries  Recent Labs  Lab 05/30/19 2305 05/31/19 0951 06/01/19 0451 06/02/19 0451 06/03/19 0458 06/03/19 1716 06/04/19 0648  NA 143  --  144 144  --   --  142  K 4.0  --  4.2 3.5  --   --  3.7  CL 112*  --  117* 116*  --   --  114*  CO2 21*  --  21* 20*  --   --  19*  GLUCOSE 153*  --  97 112*  --   --  113*  BUN 39*  --  33* 22  --   --  9  CREATININE 1.42* 1.29* 1.26* 0.99  --   --  0.83  CALCIUM 8.7*  --  7.6* 7.8*  --   --  8.3*  AST 24  --  313* 137* 61* 43* 33  ALT 37  --  530* 367* 251* 207* 178*  ALKPHOS 84  --  88 87 97 78 80  BILITOT 0.3  --  0.6 0.4 0.4 0.2* 0.5   ------------------------------------------------------------------------------------------------------------------ No results for input(s): CHOL, HDL, LDLCALC, TRIG, CHOLHDL, LDLDIRECT in the last 72 hours.  Lab Results  Component Value Date   HGBA1C 5.2 05/09/2016   ------------------------------------------------------------------------------------------------------------------ No results for input(s): TSH, T4TOTAL, T3FREE,  THYROIDAB in the last 72 hours.  Invalid input(s): FREET3 ------------------------------------------------------------------------------------------------------------------ Recent Labs    06/02/19 0451  VITAMINB12 676  FOLATE 26.9    Coagulation profile No results for input(s): INR, PROTIME in the last 168 hours.  No results for input(s): DDIMER in the last 72 hours.  Cardiac Enzymes No results for input(s): CKMB, TROPONINI, MYOGLOBIN in the last 168 hours.  Invalid input(s): CK ------------------------------------------------------------------------------------------------------------------    Component Value Date/Time   BNP 15.9 01/21/2015 1000     Roxan Hockey M.D on 06/04/2019 at 1:29 PM  Go to [email or web address] - for contact info  [facility] - Office  [phone]

## 2019-06-04 NOTE — Evaluation (Signed)
Physical Therapy Evaluation Patient Details Name: Tamara Stein MRN: [id number] DOB: 04-21-1941 Today's Date: 06/04/2019   History of Present Illness  Tamara Stein  is a 78 y.o. female, with history of spinal cerebellar degeneration, hypertension, CKD stage III, anxiety, hypothyroidism, hyperlipidemia came to the hospital with abdominal pain and diarrhea.  Patient had similar presentation in September at that time she was diagnosed with acute diverticulitis, was treated with IV Zosyn and discharged on Augmentin.    Clinical Impression  Patient had difficulty with transfers due to not having set up she uses at home, apprehensive to attempt scooting laterally or use sliding board, able to scoot backwards into chair but required Mod assistance, able to stand with Max assist during stand pivot transfer back to bed.  Patient states she will be fine at home using her power scooter for transfers.  Plan:  Patient discharged from physical therapy to care of nursing for sitting up in bed unsupported as tolerated for length of stay.    Follow Up Recommendations No PT follow up    Equipment Recommendations  None recommended by PT    Recommendations for Other Services       Precautions / Restrictions Precautions Precautions: Fall Restrictions Weight Bearing Restrictions: No      Mobility  Bed Mobility Overal bed mobility: Modified Independent             General bed mobility comments: slightly labored movement, increased time  Transfers Overall transfer level: Needs assistance Equipment used: 1 person hand held assist;None Transfers: Sit to/from [facility];Lateral/Scoot Transfers Sit to Stand: Max assist Stand pivot transfers: Max assist      Lateral/Scoot Transfers: Mod assist General transfer comment: had difficulty scooting backwards into chair, states she uses rail at home and has her own set up for transferring to power scooter and  commode  Ambulation/Gait                Stairs            Wheelchair Mobility    Modified Rankin (Stroke Patients Only)       Balance Overall balance assessment: Needs assistance Sitting-balance support: Feet unsupported;No upper extremity supported Sitting balance-Leahy Scale: Good Sitting balance - Comments: long sitting in bed   Standing balance support: During functional activity;No upper extremity supported Standing balance-Leahy Scale: Poor Standing balance comment: hand held assist                             Pertinent Vitals/Pain Pain Assessment: Faces Faces Pain Scale: Hurts a little bit Pain Location: stomach Pain Descriptors / Indicators: Aching Pain Intervention(s): Limited activity within patient's tolerance;Monitored during session    Lee expects to be discharged to:: Private residence Living Arrangements: Other relatives Available Help at Discharge: Family;Available 24 hours/day Type of Home: House Home Access: Ramped entrance     Home Layout: One level Home Equipment: Youth worker - 2 wheels;Tub bench;Grab bars - toilet;Grab bars - tub/shower      Prior Function Level of Independence: Needs assistance   Gait / Transfers Assistance Needed: non ambulatory, Mod Indep for bed<>electric scooter<>commode transfers using rails scooting over  ADL's / Homemaking Assistance Needed: assisted for showers and household ADLs        Hand Dominance        Extremity/Trunk Assessment   Upper Extremity Assessment Upper Extremity Assessment: Overall WFL for tasks assessed    Lower Extremity Assessment Lower  Extremity Assessment: Generalized weakness    Cervical / Trunk Assessment Cervical / Trunk Assessment: Normal  Communication   Communication: No difficulties  Cognition Arousal/Alertness: Awake/alert Behavior During Therapy: WFL for tasks assessed/performed Overall Cognitive Status: Within  Functional Limits for tasks assessed                                        General Comments      Exercises     Assessment/Plan    PT Assessment Patent does not need any further PT services  PT Problem List         PT Treatment Interventions      PT Goals (Current goals can be found in the Care Plan section)  Acute Rehab PT Goals Patient Stated Goal: return home with family to assist PT Goal Formulation: With patient Time For Goal Achievement: 06/04/19 Potential to Achieve Goals: Good    Frequency     Barriers to discharge        Co-evaluation               AM-PAC PT "6 Clicks" Mobility  Outcome Measure Help needed turning from your back to your side while in a flat bed without using bedrails?: None Help needed moving from lying on your back to sitting on the side of a flat bed without using bedrails?: None Help needed moving to and from a bed to a chair (including a wheelchair)?: A Lot Help needed standing up from a chair using your arms (e.g., wheelchair or bedside chair)?: A Lot Help needed to walk in hospital room?: Total Help needed climbing 3-5 steps with a railing? : Total 6 Click Score: 14    End of Session   Activity Tolerance: Patient tolerated treatment well;Patient limited by fatigue Patient left: in bed;with call bell/phone within reach Nurse Communication: Mobility status PT Visit Diagnosis: Unsteadiness on feet (R26.81);Other abnormalities of gait and mobility (R26.89);Muscle weakness (generalized) (M62.81)    Time: 9323-5573 PT Time Calculation (min) (ACUTE ONLY): 33 min   Charges:   PT Evaluation $PT Eval Moderate Complexity: 1 Mod PT Treatments $Therapeutic Activity: 23-37 mins        12:34 PM, 06/04/19 Lonell Grandchild, MPT Physical Therapist with [facility] [phone] office [phone] mobile phone

## 2019-06-04 NOTE — Progress Notes (Signed)
Subjective:  Patient states he had normal stool yesterday.  She continues to have intermittent pain.  She denies nausea vomiting.  She said her appetite is fine.  He did not eat much at breakfast.  At home she eats egg and bacon and a coffee.  Patient would like to get the test done while she is hospitalized so that she would not have to come back.    Objective: Blood pressure (!) 184/93, pulse 95, temperature 98.3 F (36.8 C), temperature source Oral, resp. rate 18, height 5' 2"  (1.575 m), weight 86.3 kg, SpO2 97 %. Patient is alert and in no acute distress. He has mild dysarthria. Abdomen is full.  Bowel sounds are normal.  She has mild tenderness in LLQ and RLQ.  No organomegaly or masses.  Labs/studies Results:  CBC Latest Ref Rng & Units 06/04/2019 06/03/2019 06/02/2019  WBC 4.0 - 10.5 K/uL 7.0 9.1 11.9(H)  Hemoglobin 12.0 - 15.0 g/dL 9.6(L) 9.5(L) 9.0(L)  Hematocrit 36.0 - 46.0 % 31.3(L) 32.1(L) 29.9(L)  Platelets 150 - 400 K/uL 155 145(L) 153    CMP Latest Ref Rng & Units 06/04/2019 06/03/2019 06/03/2019  Glucose 70 - 99 mg/dL 113(H) - -  BUN 8 - 23 mg/dL 9 - -  Creatinine 0.44 - 1.00 mg/dL 0.83 - -  Sodium 135 - 145 mmol/L 142 - -  Potassium 3.5 - 5.1 mmol/L 3.7 - -  Chloride 98 - 111 mmol/L 114(H) - -  CO2 22 - 32 mmol/L 19(L) - -  Calcium 8.9 - 10.3 mg/dL 8.3(L) - -  Total Protein 6.5 - 8.1 g/dL 5.8(L) 5.6(L) 5.6(L)  Total Bilirubin 0.3 - 1.2 mg/dL 0.5 0.2(L) 0.4  Alkaline Phos 38 - 126 U/L 80 78 97  AST 15 - 41 U/L 33 43(H) 61(H)  ALT 0 - 44 U/L 178(H) 207(H) 251(H)    Hepatic Function Latest Ref Rng & Units 06/04/2019 06/03/2019 06/03/2019  Total Protein 6.5 - 8.1 g/dL 5.8(L) 5.6(L) 5.6(L)  Albumin 3.5 - 5.0 g/dL 3.1(L) 2.9(L) 3.1(L)  AST 15 - 41 U/L 33 43(H) 61(H)  ALT 0 - 44 U/L 178(H) 207(H) 251(H)  Alk Phosphatase 38 - 126 U/L 80 78 97  Total Bilirubin 0.3 - 1.2 mg/dL 0.5 0.2(L) 0.4  Bilirubin, Direct 0.0 - 0.2 mg/dL - 0.1 0.1    GI pathogen panel not  done.   Assessment:  #1.  Left-sided colitis.  Patient was hospitalized last month for what was felt to be diverticulitis.  CT finding suggest ischemic colitis.  Diarrhea has resolved.  Patient would prefer to have endoscopic evaluation during hospitalization.  Therefore it would be reasonable to proceed with flexible sigmoidoscopy before she is discharged.  I am concerned about inadequate prep for this exam.  Therefore she will be prepped with GoLYTELY.  Patient is agreeable. Patient is on low-dose aspirin and Lovenox.  #2.  Elevated transaminases.  Significant improvement since admission.  AST is now normal and ALT is down to 178.  Viral markers negative.  This episode may have been due to ischemic injury.  Prior elevation in transaminases has not been as significant.  #3.  Anemia.  Stool was guaiac negative on admission.  B12 and folate levels are normal.  #4.  Chronic GERD.  Patient has large hiatal hernia.  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  She has motorized wheelchair at home.   Recommendations  Flexible sigmoidoscopy on 06/05/2019. Patient will be prepped with GoLYTELY in order to  make sure prep adequate for this exam.

## 2019-06-04 NOTE — Progress Notes (Signed)
Tolerating golytely prep without diff or n/v.

## 2019-06-05 ENCOUNTER — Encounter (HOSPITAL_COMMUNITY)
Admission: EM | Disposition: A | Discharge status: Home / Self Care | Payer: Self-pay | Source: Home / Self Care | Attending: Family Medicine

## 2019-06-05 DIAGNOSIS — K5700 Diverticulitis of small intestine with perforation and abscess without bleeding: Secondary | ICD-10-CM

## 2019-06-05 DIAGNOSIS — R945 Abnormal results of liver function studies: Secondary | ICD-10-CM | POA: Diagnosis not present

## 2019-06-05 DIAGNOSIS — K644 Residual hemorrhoidal skin tags: Secondary | ICD-10-CM

## 2019-06-05 DIAGNOSIS — K529 Noninfective gastroenteritis and colitis, unspecified: Secondary | ICD-10-CM | POA: Diagnosis not present

## 2019-06-05 HISTORY — PX: FLEXIBLE SIGMOIDOSCOPY: SHX5431

## 2019-06-05 LAB — HEPATIC FUNCTION PANEL
ALT: 130 U/L — ABNORMAL HIGH (ref 0–44)
AST: 25 U/L (ref 15–41)
Albumin: 3.3 g/dL — ABNORMAL LOW (ref 3.5–5.0)
Alkaline Phosphatase: 78 U/L (ref 38–126)
Bilirubin, Direct: 0.1 mg/dL (ref 0.0–0.2)
Indirect Bilirubin: 0.6 mg/dL (ref 0.3–0.9)
Total Bilirubin: 0.7 mg/dL (ref 0.3–1.2)
Total Protein: 6.2 g/dL — ABNORMAL LOW (ref 6.5–8.1)

## 2019-06-05 SURGERY — SIGMOIDOSCOPY, FLEXIBLE
Anesthesia: Moderate Sedation

## 2019-06-05 MED ORDER — MEPERIDINE HCL 50 MG/ML IJ SOLN
INTRAMUSCULAR | Status: AC
  Filled 2019-06-05: qty 1

## 2019-06-05 MED ORDER — LISINOPRIL 10 MG PO TABS: 10.0000 mg | ORAL_TABLET | Freq: Every day | ORAL | 3 refills | Status: DC

## 2019-06-05 MED ORDER — HYDRALAZINE HCL 50 MG PO TABS: 50.0000 mg | ORAL_TABLET | Freq: Three times a day (TID) | ORAL | 3 refills | Status: DC

## 2019-06-05 MED ORDER — AMLODIPINE BESYLATE 10 MG PO TABS: 10.0000 mg | ORAL_TABLET | Freq: Every day | ORAL | 1 refills | Status: DC

## 2019-06-05 MED ORDER — ACETAMINOPHEN 325 MG PO TABS: 650.0000 mg | ORAL_TABLET | Freq: Four times a day (QID) | ORAL | 2 refills | Status: DC | PRN

## 2019-06-05 MED ORDER — ASPIRIN EC 81 MG PO TBEC: 81.0000 mg | DELAYED_RELEASE_TABLET | Freq: Every day | ORAL | 2 refills | Status: DC

## 2019-06-05 MED ORDER — ONDANSETRON HCL 4 MG PO TABS: 4.0000 mg | ORAL_TABLET | Freq: Four times a day (QID) | ORAL | 0 refills | Status: DC | PRN

## 2019-06-05 MED ORDER — MEPERIDINE HCL 25 MG/ML IJ SOLN
INTRAMUSCULAR | Status: DC | PRN
  Administered 2019-06-05: 20 mg via INTRAVENOUS

## 2019-06-05 MED ORDER — CEFDINIR 300 MG PO CAPS: 300.0000 mg | ORAL_CAPSULE | Freq: Two times a day (BID) | ORAL | 0 refills | Status: DC

## 2019-06-05 MED ORDER — METRONIDAZOLE 500 MG PO TABS: 500.0000 mg | ORAL_TABLET | Freq: Three times a day (TID) | ORAL | 0 refills | Status: DC

## 2019-06-05 MED ORDER — MIDAZOLAM HCL 5 MG/5ML IJ SOLN
INTRAMUSCULAR | Status: AC
  Filled 2019-06-05: qty 10

## 2019-06-05 MED ORDER — ISOSORBIDE MONONITRATE ER 30 MG PO TB24: 30.0000 mg | ORAL_TABLET | Freq: Every day | ORAL | 3 refills | Status: DC

## 2019-06-05 MED ORDER — MIDAZOLAM HCL 5 MG/5ML IJ SOLN
INTRAMUSCULAR | Status: DC | PRN
  Administered 2019-06-05 (×2): 1 mg via INTRAVENOUS

## 2019-06-05 NOTE — Discharge Summary (Signed)
Tamara Stein, is a 78 y.o. female  DOB June 16, 1941  MRN [id number].  Admission date:  05/30/2019  Admitting Physician  Oswald Hillock, MD  Discharge Date:  06/05/2019   Primary MD  Janora Norlander, DO  Recommendations for primary care physician for things to follow:  - 1) please note that there is been changes to your blood pressure medications 2)-please complete Omnicef and metronidazole/Flagyl antibiotic as prescribed for possible infection/colitis  Admission Diagnosis  Dehydration [E86.0] Colitis [K52.9]   Discharge Diagnosis  Dehydration [E86.0] Colitis [K52.9]    Active Problems:   Colitis   Symptomatic anemia   Abnormal LFTs   Diarrhea      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[place])    Chronic kidney disease    Chronic knee pain    Depression    GERD (gastroesophageal reflux disease)    Hyperlipidemia    Hypertension    Thyroid disease     Past Surgical History:  Procedure Laterality Date   ABDOMINAL HYSTERECTOMY     CHOLECYSTECTOMY     MOUTH SURGERY     RIGHT ELBOW       HPI  from the history and physical done on the day of admission:    - Tamara Stein  is a 78 y.o. female, with history of spinal cerebellar degeneration, hypertension, CKD stage III, anxiety, hypothyroidism, hyperlipidemia came to the hospital with abdominal pain and diarrhea.  Patient had similar presentation in September at that time she was diagnosed with acute diverticulitis, was treated with IV Zosyn and discharged on Augmentin. Today patient came to the ED with complaints of abdominal pain with diarrhea, patient had 2 large BMs in the ED.  CT scan of the abdomen showed findings suggestive of splenic flexure and proximal descending colitis.  No pericolonic abscess or free air. Patient started on ceftriaxone and  Flagyl. She denies chest pain or shortness of breath. Denies fever or chills. Denies dysuria    Hospital Course:   - Brief Summary -78 y.o.femalewith a past medical history of anxiety, depression, pretension, chronic kidney diseasestage II, spinal cerebellar degeneration, hypothyroidism and diverticulitis admitted on 05/31/2019 with abdominal pain and elevated LFTs and CT abdomen findings of colitis with concerns about possible ischemic colitis   A/p 1)Splenic Flexure and Proximal Descending colitis --- CT abdomen and pelvis findings noted, - flexible sigmoidoscopy on 06/05/2019 without significant acute findings, suspicion of ischemic colitis not confirmed -Overall improved with IV Rocephin and Flagyl, okay to discharge on Omnicef and Flagyl to complete treatment for presumed infectious colitis - -GI consult appreciated ---WBC down to 7.0K from 32.9K --Tolerating soft diet well -Unable to obtain stool studies as patient in the longer has diarrhea  2)Elevated LFTs--- review of records shows intermittent LFTs elevation in the past--Had imaging work-up previously, -Discussed with Dr. Laural Golden from GI service, -ALT is down to 130 from 530 and AST is down to 25 from 313 -  Okay to resume Lipitor -Patient is status post prior cholecystectomy -Acute viral hepatitis profile is negative  3) CKD stage - III, suspect worsening renal function due to dehydration, E. coli UTI and soft BP,   creatinine on admission= 1.45 ,   baseline creatinine = 1.3 to 1.4    , creatinine is now= 0.83     , renally adjust medications, avoid nephrotoxic agents / dehydration /hypotension  4)E coli UTI--treated with IV Rocephin per sensitivity report, discharged on Omnicef  5)Depression and anxiety-stable continue Cymbalta 60 mg daily  6)HTN--BP improved after adjustments of medications  p- continue amlodipine 10 mg daily, add p.o. hydralazine/isosorbide combo,  -Lisinopril 10 mg daily  7) acute on  chronic chronic anemia-Hemoccult blood negative, baseline hemoglobin recently around 11, admission hemoglobin 11.8, hemoglobin down to 9.6 at this time after hydration - no evidence of ongoing bleeding at this time elevated MCV noted -Flexible sigmoidoscopy without evidence of bleeding  8)hypothyroidism-continue levothyroxine 50 mcg daily  9)GERD/Hiatal Hernia- continue Protonix  10) cerebellar degeneration--patient has significant challenges with mobility related activities of daily living, she is wheelchair-bound, she gets from bed to wheelchair by sliding --PT eval appreciated, no further PT recommended as patient is at baseline -Patient will need hospital bed/DME to help with transfers and positional change  Code Status : DNR  Family Communication:   (patient is alert, awake and coherent) Discussed with POA Mr. Jill Poling [phone]) Disposition Plan  : home   Consults  :  Gi Procedures:-  flexible sigmoidoscopy on 06/05/2019  Discharge Condition: stable  Follow UP--PCP as previously scheduled  Diet and Activity recommendation:  As advised  Discharge Instructions    Discharge Instructions    Call MD for:  difficulty breathing, headache or visual disturbances   Complete by: As directed    Call MD for:  persistant dizziness or light-headedness   Complete by: As directed    Call MD for:  persistant nausea and vomiting   Complete by: As directed    Call MD for:  severe uncontrolled pain   Complete by: As directed    Call MD for:  temperature >100.4   Complete by: As directed    Diet - low sodium heart healthy   Complete by: As directed    Discharge instructions   Complete by: As directed    1) please note that there is been changes to your blood pressure medications 2)-please complete Omnicef and metronidazole/Flagyl antibiotic as prescribed for possible infection/colitis   Increase activity slowly   Complete by: As directed    Fall precautions--        Discharge Medications     Allergies as of 06/05/2019      Reactions   Sulfa Antibiotics Anaphylaxis   Facial swelling    Codeine Other (See Comments)   BLACK OUTS   Doxycycline Nausea Only   Erythromycin Swelling   Ibuprofen Other (See Comments)   Stomach pain, muscle spasm, GI bleeding   Levaquin [levofloxacin] Other (See Comments)   Insomnia/trouble breathing   Penicillins Diarrhea   Has patient had a PCN reaction causing immediate rash, facial/tongue/throat swelling, SOB or lightheadedness with hypotension: No Has patient had a PCN reaction causing severe rash involving mucus membranes or skin necrosis: No Has patient had a PCN reaction that required hospitalization Unknown Has patient had a PCN reaction occurring within the last 10 years: No If all of the above answers are "NO", then may proceed with Cephalosporin use.  Medication List    STOP taking these medications   diclofenac 50 MG EC tablet Commonly known as: VOLTAREN     TAKE these medications   acetaminophen 325 MG tablet Commonly known as: TYLENOL Take 2 tablets (650 mg total) by mouth every 6 (six) hours as needed for mild pain, fever or headache (or Fever >/= 101).   Align Prebiotic-Probiotic 5-1.25 MG-GM Chew Chew 1 tablet by mouth daily.   amLODipine 10 MG tablet Commonly known as: NORVASC Take 1 tablet (10 mg total) by mouth daily. for blood pressure What changed:   when to take this  additional instructions   aspirin EC 81 MG tablet Take 1 tablet (81 mg total) by mouth daily with breakfast. What changed: when to take this   atorvastatin 40 MG tablet Commonly known as: LIPITOR Take 1 tablet (40 mg total) by mouth at bedtime. Do not restart until instructed by MD   CALTRATE 600+D PO Take 1 tablet by mouth daily.   cefdinir 300 MG capsule Commonly known as: OMNICEF Take 1 capsule (300 mg total) by mouth 2 (two) times daily for 3 days.   cetirizine 10 MG chewable tablet Commonly known  as: ZYRTEC Chew 10 mg by mouth daily.   DULoxetine 60 MG capsule Commonly known as: CYMBALTA Take 1 capsule (60 mg total) by mouth daily. What changed: when to take this   furosemide 20 MG tablet Commonly known as: LASIX Take 1 tablet (20 mg total) by mouth daily as needed. As needed for swelling   hydrALAZINE 50 MG tablet Commonly known as: APRESOLINE Take 1 tablet (50 mg total) by mouth 3 (three) times daily. For BP   isosorbide mononitrate 30 MG 24 hr tablet Commonly known as: IMDUR Take 1 tablet (30 mg total) by mouth daily. Start taking on: June 06, 2019   levothyroxine 50 MCG tablet Commonly known as: SYNTHROID TAKE 1 TABLET ONCE DAILY What changed:   how much to take  how to take this  when to take this  additional instructions   lisinopril 10 MG tablet Commonly known as: ZESTRIL Take 1 tablet (10 mg total) by mouth daily. What changed:   medication strength  how much to take   meclizine 25 MG tablet Commonly known as: ANTIVERT Take 25 mg by mouth 3 (three) times daily as needed for dizziness.   metroNIDAZOLE 500 MG tablet Commonly known as: Flagyl Take 1 tablet (500 mg total) by mouth 3 (three) times daily for 3 days.   multivitamin with minerals Tabs tablet Take 1 tablet by mouth daily.   omeprazole 40 MG capsule Commonly known as: PRILOSEC TAKE 1 CAPSULE DAILY What changed: when to take this   ondansetron 4 MG tablet Commonly known as: ZOFRAN Take 1 tablet (4 mg total) by mouth every 6 (six) hours as needed for nausea. What changed:   when to take this  reasons to take this   raloxifene 60 MG tablet Commonly known as: EVISTA Take 1 tablet (60 mg total) by mouth daily. What changed: when to take this            Durable Medical Equipment  (From admission, onward)         Start     Ordered   06/05/19 1328  For home use only DME Hospital bed  Once    Question Answer Comment  Length of Need Lifetime   Patient has (list  medical condition): Cerebellar degeneration--wheelchair and bedbound   The above medical condition  requires: Patient requires the ability to reposition frequently   Head must be elevated greater than: 30 degrees   Bed type Semi-electric   Support Surface: Gel Overlay      06/05/19 1327          Major procedures and Radiology Reports - PLEASE review detailed and final reports for all details, in brief -   Mr Abdomen W Wo Contrast  Result Date: 05/27/2019 CLINICAL DATA:  Enlarging splenic lesion on CT. EXAM: MRI ABDOMEN WITHOUT AND WITH CONTRAST TECHNIQUE: Multiplanar multisequence MR imaging of the abdomen was performed both before and after the administration of intravenous contrast. CONTRAST:  57m GADAVIST GADOBUTROL 1 MMOL/ML IV SOLN COMPARISON:  05/04/2019 CT abdomen/pelvis. FINDINGS: Lower chest: No acute abnormality at the lung bases. Hepatobiliary: Normal liver size and configuration. No hepatic steatosis. Segment 4A left liver dome simple subcentimeter cyst. No additional liver lesions. Cholecystectomy. Bile ducts are within normal post cholecystectomy limits. Common bile duct diameter 8 mm. No evidence of choledocholithiasis, biliary strictures or beading. Pancreas: There is a 0.7 cm cystic pancreatic body lesion (series 9/image 24) without wall thickening, convincing enhancement or thick septations. No additional pancreatic lesions. No pancreatic duct dilation. No definite pancreas divisum. Spleen: Normal size spleen. There is a 3.1 x 2.8 cm cystic splenic mass (series 9/image 7) with a few thin internal septations, without wall thickening, solid enhancement or thickened septations, compatible with a splenic lymphangioma. No additional splenic lesions. Adrenals/Urinary Tract: Normal adrenals. No hydronephrosis. Several simple renal cysts scattered throughout both kidneys, largest 1.6 cm in the upper right kidney and 1.3 cm in the upper left kidney. There is a 0.6 cm angiomyolipoma in the  anterior upper left kidney (series 21/image 18 and series 6/image 23). Stomach/Bowel: Large hiatal hernia. Otherwise normal nondistended stomach. Visualized small and large bowel is normal caliber, with no bowel wall thickening. Vascular/Lymphatic: Atherosclerotic nonaneurysmal abdominal aorta. Patent portal, splenic, hepatic and renal veins. No pathologically enlarged lymph nodes in the abdomen. Other: No abdominal ascites or focal fluid collection. Musculoskeletal: No aggressive appearing focal osseous lesions. 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 2. Tiny 0.7 cm cystic pancreatic body lesion without high risk MRI features, most commonly a side branch IPMN. No biliary or pancreatic duct dilation. Follow-up MRI abdomen without and with IV contrast recommended in 2 years. This recommendation follows ACR consensus guidelines: Management of Incidental Pancreatic Cysts: A White Paper of the ACR Incidental Findings Committee. J Am Coll Radiol 22706;23:[id number] 3. 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 4. Large hiatal hernia. 5.  Aortic Atherosclerosis (ICD10-I70.0). Electronically Signed   By: JIlona SorrelM.D.   On: 05/27/2019 13:24   Ct Abdomen Pelvis W Contrast  Result Date: 05/31/2019 CLINICAL DATA:  Abdominal pain for 4 hours EXAM: CT ABDOMEN AND PELVIS WITH CONTRAST TECHNIQUE: Multidetector CT imaging of the abdomen and pelvis was performed using the standard protocol following bolus administration of intravenous contrast. CONTRAST:  714mOMNIPAQUE IOHEXOL 300 MG/ML  SOLN COMPARISON:  MRI May 27, 2019, CT May 04, 2019 FINDINGS: Lower chest: There is a large paraesophageal hernia present. There is mild cardiomegaly. The visualized portions of the lungs are clear. Hepatobiliary: The liver is normal in density without focal abnormality.The main portal vein is patent. The patient is status post cholecystectomy. No biliary ductal dilation. Again noted is mild dilatation  of the common bile duct. Pancreas: Unremarkable. No pancreatic ductal dilatation or surrounding inflammatory changes. Spleen: Normal in size. There is an unchanged 3 cm  low-density lesion seen within the splenic dome. Adrenals/Urinary Tract: Both adrenal glands appear normal. Low-density lesions seen within both kidneys, shown to be renal cysts on recent MRI. There is also a tiny fat containing lesion in the upper pole the left kidney, consistent with angiomyolipoma Stomach/Bowel: The small bowel is unremarkable. There is a focal segment of colon within the splenic flexure and descending colon that appears to have diffuse bowel wall thickening with surrounding mesenteric fat stranding changesscattered colonic diverticula are noted within the descending colon. There is a moderate amount of colonic stool. No pericolonic free fluid or Vascular/Lymphatic: There are no enlarged mesenteric, retroperitoneal, or pelvic lymph nodes. Scattered aortic atherosclerotic calcifications are seen without aneurysmal dilatation. Reproductive: Again noted within the left adnexa a 4 cm low-density lesion which appears to be present and stable since April 2020. The lesion was present dating back to 2017, however minimally enlarged since that exam. Other: A small fat containing anterior umbilical hernia seen. Musculoskeletal: No acute or significant osseous findings. Chronic superior compression deformity of the L4 vertebral bodies seen with 50% loss in vertebral body height. IMPRESSION: 1. 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 No pericolonic abscess or free air. 2. 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 3. Large paraesophageal hernia. 4. Stable 4 cm left adnexal cyst. 5.  Aortic Atherosclerosis (ICD10-I70.0). Electronically Signed   By: Prudencio Pair M.D.   On: 05/31/2019 02:08   Micro Results   Recent Results (from the past 240 hour(s))  Culture, Urine     Status:  Abnormal   Collection Time: 05/31/19  2:16 AM   Specimen: Urine, Clean Catch  Result Value Ref Range Status   Specimen Description   Final    URINE, CLEAN CATCH Performed at [facility], [address] [place], [place] [address]    Special Requests   Final    NONE Performed at [facility] [address] [place], [place] [address]    Culture >=100,000 COLONIES/mL ESCHERICHIA COLI (A)  Final   Report Status 06/02/2019 FINAL  Final   Organism ID, Bacteria ESCHERICHIA COLI (A)  Final      Susceptibility   Escherichia coli - MIC*    AMPICILLIN 8 SENSITIVE Sensitive     CEFAZOLIN <=4 SENSITIVE Sensitive     CEFTRIAXONE <=1 SENSITIVE Sensitive     CIPROFLOXACIN <=0.25 SENSITIVE Sensitive     GENTAMICIN <=1 SENSITIVE Sensitive     IMIPENEM <=0.25 SENSITIVE Sensitive     NITROFURANTOIN <=16 SENSITIVE Sensitive     TRIMETH/SULFA <=20 SENSITIVE Sensitive     AMPICILLIN/SULBACTAM 4 SENSITIVE Sensitive     PIP/TAZO <=4 SENSITIVE Sensitive     Extended ESBL NEGATIVE Sensitive     * >=100,000 COLONIES/mL ESCHERICHIA COLI  SARS CORONAVIRUS 2 (TAT 6-24 HRS) Nasopharyngeal Nasopharyngeal Swab     Status: None   Collection Time: 05/31/19  3:19 AM   Specimen: Nasopharyngeal Swab  Result Value Ref Range Status   SARS Coronavirus 2 NEGATIVE NEGATIVE Final    Comment: (NOTE) SARS-CoV-2 target nucleic acids are NOT DETECTED. The SARS-CoV-2 RNA is generally detectable in upper and lower respiratory specimens during the acute phase of infection. Negative results do not preclude SARS-CoV-2 infection, do not rule out co-infections with other pathogens, and should not be used as the sole basis for treatment or other patient management decisions. Negative results must be combined with clinical observations, patient history, and epidemiological information. The expected result is Negative. Fact Sheet for  Patients: SugarRoll.be Fact Sheet for Healthcare  Providers: https://www.woods-mathews.com/ This test is not yet approved or cleared by the [place] FDA and  has been authorized for detection and/or diagnosis of SARS-CoV-2 by FDA under an Emergency Use Authorization (EUA). This EUA will remain  in effect (meaning this test can be used) for the duration of the COVID-19 declaration under Section 56 4(b)(1) of the Act, 21 U.S.C. section 360bbb-3(b)(1), unless the authorization is terminated or revoked sooner. Performed at [facility], [place] [address], [place], [place] [address]     Today   Subjective    Richetta Cubillos today has no new complaints -Tolerated flexible sigmoidoscopy well -Tolerating soft diet well -Eager to go home          Patient has been seen and examined prior to discharge   Objective   Blood pressure (!) 144/104, pulse (!) 105, temperature 98.6 F (37 C), temperature source Oral, resp. rate (!) 22, height 5' 2"  (1.575 m), weight 86.3 kg, SpO2 94 %.   Intake/Output Summary (Last 24 hours) at 06/05/2019 1337 Last data filed at 06/04/2019 1700 Gross per 24 hour  Intake 240 ml  Output 350 ml  Net -110 ml   Exam Gen:- Awake Alert, no acute distress  HEENT:- [place].AT, No sclera icterus Neck-Supple Neck,No JVD,.  Lungs-  CTAB , good air movement bilaterally  CV- S1, S2 normal, regular Abd-  +ve B.Sounds, Abd Soft, No tenderness,    Extremity/Skin:- No  edema,   good pulses Psych-affect is appropriate, oriented x3 Neuro-no new focal deficits, no tremors  -At baseline patient is wheelchair and bedbound   Data Review   CBC w Diff:  Lab Results  Component Value Date   WBC 7.0 06/04/2019   HGB 9.6 (L) 06/04/2019   HGB 11.0 (L) 05/18/2019   HCT 31.3 (L) 06/04/2019   HCT 33.3 (L) 05/18/2019   PLT 155 06/04/2019   PLT 276 05/18/2019   LYMPHOPCT 12 05/06/2019   MONOPCT 7 05/06/2019   EOSPCT 2 05/06/2019   BASOPCT 1 05/06/2019   CMP:  Lab Results  Component Value Date   NA  142 06/04/2019   NA 141 05/18/2019   K 3.7 06/04/2019   CL 114 (H) 06/04/2019   CO2 19 (L) 06/04/2019   BUN 9 06/04/2019   BUN 38 (H) 05/18/2019   CREATININE 0.83 06/04/2019   PROT 6.2 (L) 06/05/2019   PROT 6.6 05/18/2019   ALBUMIN 3.3 (L) 06/05/2019   ALBUMIN 4.2 05/18/2019   BILITOT 0.7 06/05/2019   BILITOT 0.3 05/18/2019   ALKPHOS 78 06/05/2019   AST 25 06/05/2019   ALT 130 (H) 06/05/2019   Total Discharge time is about 33 minutes  Roxan Hockey M.D on 06/05/2019 at 1:37 PM  Go to [email or web address] -  for contact info  [facility] - Office  [phone]

## 2019-06-05 NOTE — Progress Notes (Signed)
Pt returned to room from Endo. Alert and oriented. Denies c/o pain. Given H20 to drink and tolerated without diff.

## 2019-06-05 NOTE — TOC Transition Note (Signed)
Transition of Care [facility]) - CM/SW Discharge Note   Patient Details  Name: Tamara Stein MRN: [id number] Date of Birth: 30-Aug-1940  [facility] CM/SW Contact:  Latanya Maudlin, RN Phone Number: 06/05/2019, 1:51 PM   Clinical Narrative: Orders in place for DME hospital bed. Per MD can be delivered tomorrow per patient request. Will order from [employer] and plan for home delivery.             Patient Goals and CMS Choice        Discharge Placement                       Discharge Plan and Services                DME Arranged: Hospital bed DME Agency: [employer] Date DME Agency Contacted: 06/05/19 Time DME Agency Contacted: 1735              Social Determinants of Health (SDOH) Interventions     Readmission Risk Interventions Readmission Risk Prevention Plan 05/07/2019  Post Dischage Appt Complete  Medication Screening Complete  Transportation Screening Complete  Some recent data might be hidden

## 2019-06-05 NOTE — Progress Notes (Signed)
Pt down to endo via stretcher for Flexible Sigmoidoscopy.

## 2019-06-05 NOTE — Discharge Instructions (Signed)
1) please note that there is been changes to your blood pressure medications 2)-please complete Omnicef and metronidazole/Flagyl antibiotic as prescribed for possible infection/colitis

## 2019-06-05 NOTE — Progress Notes (Signed)
Pt left via stretcher with [facility] for transport home. Mariel Sleet and Romona (caregiver) both notified of pt leaving.

## 2019-06-05 NOTE — Progress Notes (Signed)
Flexible sigmoidoscopy.  Examination performed to splenic flexure. Scattered diverticula at sigmoid colon but no evidence of colitis. Normal external hemorrhoids. No biopsies taken.

## 2019-06-05 NOTE — Op Note (Signed)
[facility] Patient Name: Tamara Stein Procedure Date: 06/05/2019 7:47 AM MRN: [id number] Date of Birth: [birth year] Attending MD: Hildred Laser , MD CSN: [id number] Age: 78 Admit Type: Inpatient Procedure:                Flexible Sigmoidoscopy Indications:              Abnormal CT of the GI tract; left sided colitis. Providers:                Hildred Laser, MD, Lurline Del, RN, Nelda Severe, RN Referring MD:             Roxan Hockey, MD Medicines:                Meperidine 20 mg IV, Midazolam 2 mg IV Complications:            No immediate complications. Estimated Blood Loss:     Estimated blood loss: none. Procedure:                Pre-Anesthesia Assessment:                           - Prior to the procedure, a History and Physical                            was performed, and patient medications and                            allergies were reviewed. The patient's tolerance of                            previous anesthesia was also reviewed. The risks                            and benefits of the procedure and the sedation                            options and risks were discussed with the patient.                            All questions were answered, and informed consent                            was obtained. Prior Anticoagulants: The patient                            last took Lovenox (enoxaparin) 1 day prior to the                            procedure. ASA Grade Assessment: III - A patient                            with severe systemic disease. After reviewing the                            risks and benefits, the patient was deemed in  satisfactory condition to undergo the procedure.                           After obtaining informed consent, the scope was                            passed under direct vision. The [id number]-[id number]                            ([id number]) scope was introduced through the anus and   advanced to the the splenic flexure. The flexible                            sigmoidoscopy was somewhat difficult due to                            significant looping. The flexible sigmoidoscopy was                            somewhat difficult due to significant looping. The                            patient tolerated the procedure well. The quality                            of the bowel preparation was adequate. Scope In: 8:41:15 AM Scope Out: 8:54:33 AM Total Procedure Duration: 0 hours 13 minutes 18 seconds  Findings:      The perianal and digital rectal examinations were normal.      The descending colon and splenic flexure appeared normal.      Scattered diverticula were found in the sigmoid colon.      External hemorrhoids were found during retroflexion. The hemorrhoids       were small. Impression:               - The descending colon and splenic flexure are                            normal.                           - Diverticulosis in the sigmoid colon.                           - External hemorrhoids.                           - No specimens collected. Moderate Sedation:      Moderate (conscious) sedation was administered by the endoscopy nurse       and supervised by the endoscopist. The following parameters were       monitored: oxygen saturation, heart rate, blood pressure, CO2       capnography and response to care. Total physician intraservice time was       16 minutes. Recommendation:           - Return patient to hospital ward for ongoing care.                           -  Resume previous diet.                           - Continue present medications. Procedure Code(s):        --- Professional ---                           [id number], Sigmoidoscopy, flexible; diagnostic,                            including collection of specimen(s) by brushing or                            washing, when performed (separate procedure)                           G0500, Moderate sedation  services provided by the                            same physician or other qualified health care                            professional performing a gastrointestinal                            endoscopic service that sedation supports,                            requiring the presence of an independent trained                            observer to assist in the monitoring of the                            patient's level of consciousness and physiological                            status; initial 15 minutes of intra-service time;                            patient age 7 years or older (additional time may                            be reported with [phone], as appropriate) Diagnosis Code(s):        --- Professional ---                           K64.4, Residual hemorrhoidal skin tags                           K57.30, Diverticulosis of large intestine without                            perforation or abscess without bleeding  R93.3, Abnormal findings on diagnostic imaging of                            other parts of digestive tract CPT copyright 2019 [employer] Association. All rights reserved. The codes documented in this report are preliminary and upon coder review may  be revised to meet current compliance requirements. Hildred Laser, MD Hildred Laser, MD 06/05/2019 9:20:57 AM This report has been signed electronically. Number of Addenda: 0

## 2019-06-07 ENCOUNTER — Emergency Department (HOSPITAL_COMMUNITY): Payer: Medicare Other

## 2019-06-07 ENCOUNTER — Encounter (HOSPITAL_COMMUNITY): Payer: Self-pay

## 2019-06-07 ENCOUNTER — Other Ambulatory Visit: Payer: Self-pay

## 2019-06-07 ENCOUNTER — Inpatient Hospital Stay (HOSPITAL_COMMUNITY)
Admission: EM | Admit: 2019-06-07 | Discharge: 2019-06-09 | DRG: 194 | Disposition: A | Discharge status: Home Health | Payer: Medicare Other | Attending: Family Medicine | Admitting: Family Medicine

## 2019-06-07 DIAGNOSIS — Z881 Allergy status to other antibiotic agents status: Secondary | ICD-10-CM

## 2019-06-07 DIAGNOSIS — Z882 Allergy status to sulfonamides status: Secondary | ICD-10-CM

## 2019-06-07 DIAGNOSIS — J189 Pneumonia, unspecified organism: Principal | ICD-10-CM | POA: Diagnosis present

## 2019-06-07 DIAGNOSIS — I129 Hypertensive chronic kidney disease with stage 1 through stage 4 chronic kidney disease, or unspecified chronic kidney disease: Secondary | ICD-10-CM | POA: Diagnosis present

## 2019-06-07 DIAGNOSIS — D631 Anemia in chronic kidney disease: Secondary | ICD-10-CM | POA: Diagnosis present

## 2019-06-07 DIAGNOSIS — N183 Chronic kidney disease, stage 3 unspecified: Secondary | ICD-10-CM | POA: Diagnosis present

## 2019-06-07 DIAGNOSIS — K5150 Left sided colitis without complications: Secondary | ICD-10-CM | POA: Diagnosis not present

## 2019-06-07 DIAGNOSIS — Z20828 Contact with and (suspected) exposure to other viral communicable diseases: Secondary | ICD-10-CM | POA: Diagnosis present

## 2019-06-07 DIAGNOSIS — Z8261 Family history of arthritis: Secondary | ICD-10-CM

## 2019-06-07 DIAGNOSIS — E785 Hyperlipidemia, unspecified: Secondary | ICD-10-CM | POA: Diagnosis present

## 2019-06-07 DIAGNOSIS — J918 Pleural effusion in other conditions classified elsewhere: Secondary | ICD-10-CM | POA: Diagnosis not present

## 2019-06-07 DIAGNOSIS — Z880 Allergy status to penicillin: Secondary | ICD-10-CM

## 2019-06-07 DIAGNOSIS — E039 Hypothyroidism, unspecified: Secondary | ICD-10-CM | POA: Diagnosis not present

## 2019-06-07 DIAGNOSIS — Z8249 Family history of ischemic heart disease and other diseases of the circulatory system: Secondary | ICD-10-CM

## 2019-06-07 DIAGNOSIS — R609 Edema, unspecified: Secondary | ICD-10-CM

## 2019-06-07 DIAGNOSIS — H269 Unspecified cataract: Secondary | ICD-10-CM | POA: Diagnosis present

## 2019-06-07 DIAGNOSIS — R0689 Other abnormalities of breathing: Secondary | ICD-10-CM | POA: Diagnosis not present

## 2019-06-07 DIAGNOSIS — Z66 Do not resuscitate: Secondary | ICD-10-CM | POA: Diagnosis present

## 2019-06-07 DIAGNOSIS — Z79899 Other long term (current) drug therapy: Secondary | ICD-10-CM

## 2019-06-07 DIAGNOSIS — R0602 Shortness of breath: Secondary | ICD-10-CM | POA: Diagnosis not present

## 2019-06-07 DIAGNOSIS — I2510 Atherosclerotic heart disease of native coronary artery without angina pectoris: Secondary | ICD-10-CM | POA: Diagnosis present

## 2019-06-07 DIAGNOSIS — J90 Pleural effusion, not elsewhere classified: Secondary | ICD-10-CM | POA: Diagnosis present

## 2019-06-07 DIAGNOSIS — K449 Diaphragmatic hernia without obstruction or gangrene: Secondary | ICD-10-CM | POA: Diagnosis present

## 2019-06-07 DIAGNOSIS — Z9049 Acquired absence of other specified parts of digestive tract: Secondary | ICD-10-CM

## 2019-06-07 DIAGNOSIS — Y95 Nosocomial condition: Secondary | ICD-10-CM | POA: Diagnosis present

## 2019-06-07 DIAGNOSIS — F419 Anxiety disorder, unspecified: Secondary | ICD-10-CM | POA: Diagnosis present

## 2019-06-07 DIAGNOSIS — Z833 Family history of diabetes mellitus: Secondary | ICD-10-CM

## 2019-06-07 DIAGNOSIS — Z7982 Long term (current) use of aspirin: Secondary | ICD-10-CM

## 2019-06-07 DIAGNOSIS — Z79890 Hormone replacement therapy (postmenopausal): Secondary | ICD-10-CM

## 2019-06-07 DIAGNOSIS — F329 Major depressive disorder, single episode, unspecified: Secondary | ICD-10-CM | POA: Diagnosis present

## 2019-06-07 DIAGNOSIS — R0600 Dyspnea, unspecified: Secondary | ICD-10-CM | POA: Diagnosis present

## 2019-06-07 DIAGNOSIS — I700 Atherosclerosis of aorta: Secondary | ICD-10-CM | POA: Diagnosis present

## 2019-06-07 DIAGNOSIS — Z888 Allergy status to other drugs, medicaments and biological substances status: Secondary | ICD-10-CM

## 2019-06-07 DIAGNOSIS — G319 Degenerative disease of nervous system, unspecified: Secondary | ICD-10-CM | POA: Diagnosis present

## 2019-06-07 DIAGNOSIS — K219 Gastro-esophageal reflux disease without esophagitis: Secondary | ICD-10-CM | POA: Diagnosis present

## 2019-06-07 DIAGNOSIS — Z993 Dependence on wheelchair: Secondary | ICD-10-CM

## 2019-06-07 DIAGNOSIS — E876 Hypokalemia: Secondary | ICD-10-CM | POA: Diagnosis present

## 2019-06-07 DIAGNOSIS — Z90710 Acquired absence of both cervix and uterus: Secondary | ICD-10-CM

## 2019-06-07 DIAGNOSIS — I959 Hypotension, unspecified: Secondary | ICD-10-CM | POA: Diagnosis not present

## 2019-06-07 DIAGNOSIS — Z8349 Family history of other endocrine, nutritional and metabolic diseases: Secondary | ICD-10-CM

## 2019-06-07 LAB — COMPREHENSIVE METABOLIC PANEL
ALT: 71 U/L — ABNORMAL HIGH (ref 0–44)
AST: 22 U/L (ref 15–41)
Albumin: 3.2 g/dL — ABNORMAL LOW (ref 3.5–5.0)
Alkaline Phosphatase: 72 U/L (ref 38–126)
Anion gap: 9 (ref 5–15)
BUN: 15 mg/dL (ref 8–23)
CO2: 18 mmol/L — ABNORMAL LOW (ref 22–32)
Calcium: 8.3 mg/dL — ABNORMAL LOW (ref 8.9–10.3)
Chloride: 113 mmol/L — ABNORMAL HIGH (ref 98–111)
Creatinine, Ser: 1.27 mg/dL — ABNORMAL HIGH (ref 0.44–1.00)
GFR calc Af Amer: 46 mL/min — ABNORMAL LOW (ref >60)
GFR calc non Af Amer: 40 mL/min — ABNORMAL LOW (ref >60)
Glucose, Bld: 93 mg/dL (ref 70–99)
Potassium: 3.2 mmol/L — ABNORMAL LOW (ref 3.5–5.1)
Sodium: 140 mmol/L (ref 135–145)
Total Bilirubin: 0.7 mg/dL (ref 0.3–1.2)
Total Protein: 6 g/dL — ABNORMAL LOW (ref 6.5–8.1)

## 2019-06-07 LAB — CBC WITH DIFFERENTIAL/PLATELET
Abs Immature Granulocytes: 0.04 10*3/uL (ref 0.00–0.07)
Basophils Absolute: 0 10*3/uL (ref 0.0–0.1)
Basophils Relative: 1 %
Eosinophils Absolute: 0.1 10*3/uL (ref 0.0–0.5)
Eosinophils Relative: 2 %
HCT: 29.6 % — ABNORMAL LOW (ref 36.0–46.0)
Hemoglobin: 9.3 g/dL — ABNORMAL LOW (ref 12.0–15.0)
Immature Granulocytes: 1 %
Lymphocytes Relative: 11 %
Lymphs Abs: 0.8 10*3/uL (ref 0.7–4.0)
MCH: 31 pg (ref 26.0–34.0)
MCHC: 31.4 g/dL (ref 30.0–36.0)
MCV: 98.7 fL (ref 80.0–100.0)
Monocytes Absolute: 0.9 10*3/uL (ref 0.1–1.0)
Monocytes Relative: 12 %
Neutro Abs: 5.8 10*3/uL (ref 1.7–7.7)
Neutrophils Relative %: 73 %
Platelets: 196 10*3/uL (ref 150–400)
RBC: 3 MIL/uL — ABNORMAL LOW (ref 3.87–5.11)
RDW: 15.6 % — ABNORMAL HIGH (ref 11.5–15.5)
WBC: 7.8 10*3/uL (ref 4.0–10.5)
nRBC: 0 % (ref 0.0–0.2)

## 2019-06-07 LAB — URINALYSIS, ROUTINE W REFLEX MICROSCOPIC
Bilirubin Urine: NEGATIVE
Glucose, UA: NEGATIVE mg/dL
Hgb urine dipstick: NEGATIVE
Ketones, ur: 5 mg/dL — AB
Nitrite: NEGATIVE
Protein, ur: 100 mg/dL — AB
Specific Gravity, Urine: >1.046 — ABNORMAL HIGH (ref 1.005–1.030)
pH: 5 (ref 5.0–8.0)

## 2019-06-07 LAB — TROPONIN I (HIGH SENSITIVITY)
Troponin I (High Sensitivity): 18 ng/L — ABNORMAL HIGH (ref <18)
Troponin I (High Sensitivity): 17 ng/L (ref <18)

## 2019-06-07 LAB — BRAIN NATRIURETIC PEPTIDE: B Natriuretic Peptide: 84 pg/mL (ref 0.0–100.0)

## 2019-06-07 LAB — MAGNESIUM: Magnesium: 1.4 mg/dL — ABNORMAL LOW (ref 1.7–2.4)

## 2019-06-07 LAB — PHOSPHORUS: Phosphorus: 3.1 mg/dL (ref 2.5–4.6)

## 2019-06-07 MED ORDER — POTASSIUM CHLORIDE CRYS ER 20 MEQ PO TBCR
40.0000 meq | EXTENDED_RELEASE_TABLET | ORAL | Status: AC
  Administered 2019-06-07 – 2019-06-08 (×2): 40 meq via ORAL
  Filled 2019-06-07 (×2): qty 2

## 2019-06-07 MED ORDER — HYDRALAZINE HCL 25 MG PO TABS
50.0000 mg | ORAL_TABLET | Freq: Three times a day (TID) | ORAL | Status: DC
  Administered 2019-06-07 – 2019-06-09 (×6): 50 mg via ORAL
  Filled 2019-06-07 (×6): qty 2

## 2019-06-07 MED ORDER — SODIUM CHLORIDE 0.9 % IV SOLN
2.0000 g | Freq: Once | INTRAVENOUS | Status: AC
  Administered 2019-06-07: 20:00:00 2 g via INTRAVENOUS
  Filled 2019-06-07: qty 2

## 2019-06-07 MED ORDER — ATORVASTATIN CALCIUM 40 MG PO TABS
40.0000 mg | ORAL_TABLET | Freq: Every day | ORAL | Status: DC
  Administered 2019-06-07 – 2019-06-08 (×2): 40 mg via ORAL
  Filled 2019-06-07 (×2): qty 1

## 2019-06-07 MED ORDER — LEVOTHYROXINE SODIUM 50 MCG PO TABS
50.0000 ug | ORAL_TABLET | Freq: Every day | ORAL | Status: DC
  Administered 2019-06-08 – 2019-06-09 (×2): 50 ug via ORAL
  Filled 2019-06-07 (×2): qty 1

## 2019-06-07 MED ORDER — ACETAMINOPHEN 650 MG RE SUPP: 650.0000 mg | Freq: Four times a day (QID) | RECTAL | Status: DC | PRN

## 2019-06-07 MED ORDER — ENOXAPARIN SODIUM 40 MG/0.4ML ~~LOC~~ SOLN
40.0000 mg | Freq: Every day | SUBCUTANEOUS | Status: DC
  Administered 2019-06-07 – 2019-06-08 (×2): 40 mg via SUBCUTANEOUS
  Filled 2019-06-07 (×2): qty 0.4

## 2019-06-07 MED ORDER — ONDANSETRON HCL 4 MG/2ML IJ SOLN: 4.0000 mg | Freq: Four times a day (QID) | INTRAMUSCULAR | Status: DC | PRN

## 2019-06-07 MED ORDER — SODIUM CHLORIDE 0.9 % IV SOLN
2.0000 g | Freq: Two times a day (BID) | INTRAVENOUS | Status: DC
  Administered 2019-06-08 – 2019-06-09 (×3): 2 g via INTRAVENOUS
  Filled 2019-06-07 (×3): qty 2

## 2019-06-07 MED ORDER — ACETAMINOPHEN 325 MG PO TABS
650.0000 mg | ORAL_TABLET | Freq: Four times a day (QID) | ORAL | Status: DC | PRN
  Administered 2019-06-07 – 2019-06-09 (×5): 650 mg via ORAL
  Filled 2019-06-07 (×5): qty 2

## 2019-06-07 MED ORDER — ONDANSETRON HCL 4 MG PO TABS
4.0000 mg | ORAL_TABLET | Freq: Four times a day (QID) | ORAL | Status: DC | PRN
  Administered 2019-06-09: 18:00:00 4 mg via ORAL
  Filled 2019-06-07: qty 1

## 2019-06-07 MED ORDER — IOHEXOL 350 MG/ML SOLN
100.0000 mL | Freq: Once | INTRAVENOUS | Status: AC | PRN
  Administered 2019-06-07: 18:00:00 75 mL via INTRAVENOUS

## 2019-06-07 MED ORDER — AMLODIPINE BESYLATE 5 MG PO TABS
10.0000 mg | ORAL_TABLET | Freq: Every day | ORAL | Status: DC
  Administered 2019-06-08 – 2019-06-09 (×2): 10 mg via ORAL
  Filled 2019-06-07 (×2): qty 2

## 2019-06-07 MED ORDER — VANCOMYCIN HCL IN DEXTROSE 1-5 GM/200ML-% IV SOLN: 1000.0000 mg | INTRAVENOUS | Status: DC

## 2019-06-07 MED ORDER — VANCOMYCIN HCL IN DEXTROSE 1-5 GM/200ML-% IV SOLN
1000.0000 mg | Freq: Once | INTRAVENOUS | Status: AC
  Administered 2019-06-07: 20:00:00 1000 mg via INTRAVENOUS
  Filled 2019-06-07: qty 200

## 2019-06-07 MED ORDER — ISOSORBIDE MONONITRATE ER 60 MG PO TB24
30.0000 mg | ORAL_TABLET | Freq: Every day | ORAL | Status: DC
  Administered 2019-06-08 – 2019-06-09 (×2): 30 mg via ORAL
  Filled 2019-06-07 (×2): qty 1

## 2019-06-07 MED ORDER — PANTOPRAZOLE SODIUM 40 MG PO TBEC
40.0000 mg | DELAYED_RELEASE_TABLET | Freq: Every day | ORAL | Status: DC
  Administered 2019-06-08 – 2019-06-09 (×2): 40 mg via ORAL
  Filled 2019-06-07 (×2): qty 1

## 2019-06-07 MED ORDER — ASPIRIN EC 81 MG PO TBEC
81.0000 mg | DELAYED_RELEASE_TABLET | Freq: Every day | ORAL | Status: DC
  Administered 2019-06-08 – 2019-06-09 (×2): 81 mg via ORAL
  Filled 2019-06-07 (×2): qty 1

## 2019-06-07 MED ORDER — METRONIDAZOLE 500 MG PO TABS
500.0000 mg | ORAL_TABLET | Freq: Three times a day (TID) | ORAL | Status: DC
  Administered 2019-06-07 – 2019-06-09 (×6): 500 mg via ORAL
  Filled 2019-06-07 (×6): qty 1

## 2019-06-07 MED ORDER — RALOXIFENE HCL 60 MG PO TABS
60.0000 mg | ORAL_TABLET | Freq: Every morning | ORAL | Status: DC
  Administered 2019-06-08 – 2019-06-09 (×2): 60 mg via ORAL
  Filled 2019-06-07 (×2): qty 1

## 2019-06-07 NOTE — H&P (Signed)
History and Physical    Tamara Stein DEY:[id number] DOB: 10/20/1940 DOA: 06/07/2019  PCP: Janora Norlander, DO   Patient coming from: home  I have personally briefly reviewed patient's old medical records in [place]  Chief Complaint: Difficulty breathing.  HPI: Tamara Stein is a 78 y.o. female with medical history significant for cerebellar degeneration, hypothyroidism, hypertension, depression.  Patient presented to the ED with complaints of difficulty breathing started after she was discharged from the hospital and gradually progressed.  She denies cough, no chest pain..  She reports some lower extremity swelling also.  She reports improvement in abdominal symptoms since discharge.  Recent hospitalization-10/19-10/25 for colitis, with elevated LFTs, acute on chronic kidney injury.  Patient had flexible sigmoidoscopy 10/25 which was without significant acute findings, suspicion of ischemic colitis was not confirmed.  Patient was started on IV ceftriaxone and Flagyl and discharged home on Omnicef and Flagyl, for presumed infectious colitis.  Liver enzymes also trended down prior to discharge.  ED Course: O2 sats greater than 93% on room air.  WBC 7.8.  Creatinine 1.27.  BNP within normal limits at 84. Hs troponin x2 unremarkable.  2 view chest x-ray without acute abnormality.  Subsequent CTA chest-negative for PE, but shows moderate pleural effusions bilaterally with compressive atelectasis in each lung base, degree of superimposed pneumonia in the bases cannot be excluded.  Patient received IV vancomycin and cefepime for possible HCAP.  Review of Systems: As per HPI all other systems reviewed and negative.  Past Medical History:  Diagnosis Date  . Allergy   . Anxiety   . Cataract   . Cerebellar degeneration ([place])   . Chronic kidney disease   . Chronic knee pain   . Depression   . GERD (gastroesophageal reflux disease)   . Hyperlipidemia   . Hypertension   .  Thyroid disease     Past Surgical History:  Procedure Laterality Date  . ABDOMINAL HYSTERECTOMY    . CHOLECYSTECTOMY    . MOUTH SURGERY    . RIGHT ELBOW       reports that she has never smoked. She has never used smokeless tobacco. She reports that she does not drink alcohol or use drugs.  Allergies  Allergen Reactions  . Sulfa Antibiotics Anaphylaxis    Facial swelling   . Codeine Other (See Comments)    BLACK OUTS  . Doxycycline Nausea Only  . Erythromycin Swelling  . Ibuprofen Other (See Comments)    Stomach pain, muscle spasm, GI bleeding  . Levaquin [Levofloxacin] Other (See Comments)    Insomnia/trouble breathing  . Penicillins Diarrhea    Has patient had a PCN reaction causing immediate rash, facial/tongue/throat swelling, SOB or lightheadedness with hypotension: No Has patient had a PCN reaction causing severe rash involving mucus membranes or skin necrosis: No Has patient had a PCN reaction that required hospitalization Unknown Has patient had a PCN reaction occurring within the last 10 years: No If all of the above answers are "NO", then may proceed with Cephalosporin use.     Family History  Problem Relation Age of Onset  . Arthritis Sister   . Hyperlipidemia Sister   . Hypertension Sister   . Cancer Brother   . Diabetes Brother   . Heart disease Brother     Prior to Admission medications   Medication Sig Start Date End Date Taking? Authorizing Provider  acetaminophen (TYLENOL) 325 MG tablet Take 2 tablets (650 mg total) by mouth every 6 (six) hours as  needed for mild pain, fever or headache (or Fever >/= 101). 06/05/19   Roxan Hockey, MD  amLODipine (NORVASC) 10 MG tablet Take 1 tablet (10 mg total) by mouth daily. for blood pressure 06/05/19   Roxan Hockey, MD  aspirin EC 81 MG tablet Take 1 tablet (81 mg total) by mouth daily with breakfast. 06/05/19   Denton Brick, Courage, MD  atorvastatin (LIPITOR) 40 MG tablet Take 1 tablet (40 mg total) by mouth  at bedtime. Do not restart until instructed by MD 05/14/19   Orson Eva, MD  Bacillus Coagulans-Inulin (ALIGN PREBIOTIC-PROBIOTIC) 5-1.25 MG-GM CHEW Chew 1 tablet by mouth daily. 11/14/18   Isla Pence, MD  Calcium Carbonate-Vitamin D (CALTRATE 600+D PO) Take 1 tablet by mouth daily.    [provider]  cefdinir (OMNICEF) 300 MG capsule Take 1 capsule (300 mg total) by mouth 2 (two) times daily for 3 days. 06/05/19 06/08/19  Roxan Hockey, MD  cetirizine (ZYRTEC) 10 MG chewable tablet Chew 10 mg by mouth daily.    [provider]  DULoxetine (CYMBALTA) 60 MG capsule Take 1 capsule (60 mg total) by mouth daily. Patient taking differently: Take 60 mg by mouth every morning.  04/27/19   Janora Norlander, DO  furosemide (LASIX) 20 MG tablet Take 1 tablet (20 mg total) by mouth daily as needed. As needed for swelling 08/25/17   Eustaquio Maize, MD  hydrALAZINE (APRESOLINE) 50 MG tablet Take 1 tablet (50 mg total) by mouth 3 (three) times daily. For BP 06/05/19   Katye Valek, Courage, MD  isosorbide mononitrate (IMDUR) 30 MG 24 hr tablet Take 1 tablet (30 mg total) by mouth daily. 06/06/19   Roxan Hockey, MD  levothyroxine (SYNTHROID) 50 MCG tablet TAKE 1 TABLET ONCE DAILY Patient taking differently: Take 50 mcg by mouth daily before breakfast.  04/26/19   Ronnie Doss M, DO  lisinopril (ZESTRIL) 10 MG tablet Take 1 tablet (10 mg total) by mouth daily. 06/05/19   Roxan Hockey, MD  meclizine (ANTIVERT) 25 MG tablet Take 25 mg by mouth 3 (three) times daily as needed for dizziness.    [provider]  metroNIDAZOLE (FLAGYL) 500 MG tablet Take 1 tablet (500 mg total) by mouth 3 (three) times daily for 3 days. 06/05/19 06/08/19  Roxan Hockey, MD  Multiple Vitamin (MULTIVITAMIN WITH MINERALS) TABS tablet Take 1 tablet by mouth daily.    [provider]  omeprazole (PRILOSEC) 40 MG capsule TAKE 1 CAPSULE DAILY Patient taking differently: Take 40 mg by mouth  daily before breakfast.  04/26/19   Ronnie Doss M, DO  ondansetron (ZOFRAN) 4 MG tablet Take 1 tablet (4 mg total) by mouth every 6 (six) hours as needed for nausea. 06/05/19   Roxan Hockey, MD  raloxifene (EVISTA) 60 MG tablet Take 1 tablet (60 mg total) by mouth daily. Patient taking differently: Take 60 mg by mouth every morning.  04/27/19   Janora Norlander, DO  atorvastatin (LIPITOR) 40 MG tablet Take 1 tablet (40 mg total) by mouth at bedtime. 11/05/18   Janora Norlander, DO  DULoxetine (CYMBALTA) 60 MG capsule Take 1 capsule (60 mg total) by mouth daily. 11/05/18   Janora Norlander, DO    Physical Exam: Vitals:   06/07/19 1600 06/07/19 1630 06/07/19 1700 06/07/19 1900  BP: 118/76 136/73 (!) 148/81 135/74  Pulse: 93 95 (!) 102 (!) 101  Resp: 20 (!) 23 (!) 26 (!) 22  Temp:      TempSrc:  SpO2: 96% 96% 95% 96%  Weight:      Height:        Constitutional:  calm, comfortable Vitals:   06/07/19 1600 06/07/19 1630 06/07/19 1700 06/07/19 1900  BP: 118/76 136/73 (!) 148/81 135/74  Pulse: 93 95 (!) 102 (!) 101  Resp: 20 (!) 23 (!) 26 (!) 22  Temp:      TempSrc:      SpO2: 96% 96% 95% 96%  Weight:      Height:       Eyes: PERRL, lids and conjunctivae normal ENMT: Mucous membranes are moist. Posterior pharynx clear of any exudate or lesions.Normal dentition.  Neck: normal, supple, no masses, no thyromegaly Respiratory: On room air maintaining O2 sats, increased work of breathing with exertion and when talking.  Mild crackles bilateral bases, no wheezing. Normal respiratory effort at rest. No accessory muscle use.  Cardiovascular: Regular rate and rhythm, 3/6 systolic murmurs , no rubs / gallops.  Bilateral lower extremities appear puffy but without pitting.  2+ pedal pulses.  Abdomen: no tenderness, no masses palpated. No hepatosplenomegaly. Bowel sounds positive.  Musculoskeletal: no clubbing / cyanosis. No joint deformity upper and lower extremities. Good ROM,  no contractures. Normal muscle tone.  Skin: no rashes, lesions, ulcers. No induration Neurologic: Intermittent mild tremors, generalized, with poor coordination, likely related to her history of cerebellar degeneration, appears to have affected her speech, otherwise CN 2-12 grossly intact. Strength 4+ /5 in all 4.  Psychiatric: Normal judgment and insight. Alert and oriented x 3. Normal mood.   Labs on Admission: I have personally reviewed following labs and imaging studies  CBC: Recent Labs  Lab 06/01/19 0451 06/02/19 0451 06/03/19 0458 06/04/19 0648 06/07/19 1530  WBC 22.7* 11.9* 9.1 7.0 7.8  NEUTROABS  --   --   --   --  5.8  HGB 8.6* 9.0* 9.5* 9.6* 9.3*  HCT 28.5* 29.9* 32.1* 31.3* 29.6*  MCV 102.2* 101.4* 102.2* 100.0 98.7  PLT 142* 153 145* 155 355   Basic Metabolic Panel: Recent Labs  Lab 06/01/19 0451 06/02/19 0451 06/04/19 0648 06/07/19 1530  NA 144 144 142 140  K 4.2 3.5 3.7 3.2*  CL 117* 116* 114* 113*  CO2 21* 20* 19* 18*  GLUCOSE 97 112* 113* 93  BUN 33* 22 9 15   CREATININE 1.26* 0.99 0.83 1.27*  CALCIUM 7.6* 7.8* 8.3* 8.3*   GFR: Estimated Creatinine Clearance: 36.2 mL/min (A) (by C-G formula based on SCr of 1.27 mg/dL (H)). Liver Function Tests: Recent Labs  Lab 06/03/19 0458 06/03/19 1716 06/04/19 0648 06/05/19 0957 06/07/19 1530  AST 61* 43* 33 25 22  ALT 251* 207* 178* 130* 71*  ALKPHOS 97 78 80 78 72  BILITOT 0.4 0.2* 0.5 0.7 0.7  PROT 5.6* 5.6* 5.8* 6.2* 6.0*  ALBUMIN 3.1* 2.9* 3.1* 3.3* 3.2*   Urine analysis:    Component Value Date/Time   COLORURINE YELLOW 06/07/2019 1517   APPEARANCEUR HAZY (A) 06/07/2019 1517   APPEARANCEUR Cloudy (A) 01/25/2019 1425   LABSPEC >1.046 (H) 06/07/2019 1517   PHURINE 5.0 06/07/2019 1517   GLUCOSEU NEGATIVE 06/07/2019 1517   HGBUR NEGATIVE 06/07/2019 1517   BILIRUBINUR NEGATIVE 06/07/2019 1517   BILIRUBINUR Negative 01/25/2019 1425   KETONESUR 5 (A) 06/07/2019 1517   PROTEINUR 100 (A) 06/07/2019  1517   UROBILINOGEN negative 09/06/2015 1155   UROBILINOGEN 0.2 03/25/2014 1359   NITRITE NEGATIVE 06/07/2019 1517   LEUKOCYTESUR MODERATE (A) 06/07/2019 1517    Radiological Exams  on Admission: Dg Chest 2 View  Result Date: 06/07/2019 CLINICAL DATA:  Shortness of breath for 3 days EXAM: CHEST - 2 VIEW COMPARISON:  May 04, 2019 FINDINGS: The heart size is enlarged. The mediastinal contour is stable. Mild central pulmonary vascular congestion is identified unchanged. There is no focal pneumonia. Small bilateral posterior pleural effusions are identified. The bony structures are stable. IMPRESSION: Mild central pulmonary vascular congestion stable compared prior exam. No focal pneumonia. Small bilateral pleural effusions. Electronically Signed   By: Abelardo Diesel M.D.   On: 06/07/2019 15:08   Ct Angio Chest Pe W/cm &/or Wo Cm  Result Date: 06/07/2019 CLINICAL DATA:  Shortness of breath EXAM: CT ANGIOGRAPHY CHEST WITH CONTRAST TECHNIQUE: Multidetector CT imaging of the chest was performed using the standard protocol during bolus administration of intravenous contrast. Multiplanar CT image reconstructions and MIPs were obtained to evaluate the vascular anatomy. CONTRAST:  62m OMNIPAQUE IOHEXOL 350 MG/ML SOLN COMPARISON:  Chest radiograph June 07, 2019. Abdominal MRI May 27, 2019 FINDINGS: Cardiovascular: There is no demonstrable pulmonary embolus. Ascending thoracic aorta measures 4.0 x 4.0 cm in diameter. No dissection evident. There are scattered foci of calcification in the visualized great vessels. Note that the right innominate and left common carotid arteries arise as a common trunk, an anatomic variant. There is aortic atherosclerosis as well as foci of coronary artery calcification. There is no pericardial effusion or pericardial thickening. Mediastinum/Nodes: There is a subcentimeter nodular area in the thyroid. Per consensus guidelines, a nodule of this size does not warrant  additional imaging surveillance. There is no appreciable thoracic adenopathy. 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 Lungs/Pleura: There are moderate pleural effusions bilaterally. There is consolidation in each lung base, in part due to compressive atelectasis. No consolidation noted elsewhere. There is mild atelectatic change of the apex in the left upper lobe. Upper Abdomen: In the visualized upper abdomen, there is a cystic appearing mass within the anterior spleen which measures 3.0 x 2.7 cm. 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 There is aortic atherosclerosis in the upper abdominal aorta. Visualized upper abdominal structures otherwise appear unremarkable. Musculoskeletal: There is degenerative change in the thoracic spine. There are no blastic or lytic bone lesions. No chest wall lesions evident. Review of the MIP images confirms the above findings. IMPRESSION: 1.  No demonstrable pulmonary embolus. 2. Ascending thoracic aorta has a measured diameter of 4.0 x 4.0 cm. No evident dissection. There is aortic atherosclerosis as well as foci of coronary artery and great vessel calcification. Recommend annual imaging followup by CTA or MRA. This recommendation follows 2010 ACCF/AHA/AATS/ACR/ASA/SCA/SCAI/SIR/STS/SVM Guidelines for the Diagnosis and Management of Patients with Thoracic Aortic Disease. Circulation. 2010; 121:: T245-Y099 Aortic aneurysm NOS (ICD10-I71.9). 3. Moderate pleural effusions bilaterally with compressive atelectasis in each lung base. A degree of superimposed pneumonia in the bases cannot be excluded. 4.  Much of the stomach is above the diaphragm. 5.  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 6.  No evident thoracic adenopathy. Aortic Atherosclerosis (ICD10-I70.0). Electronically Signed   By: WLowella GripIII M.D.   On: 06/07/2019 18:11    EKG: Independently reviewed.   Sinus rhythm with artifact.  No significant ST or T wave abnormalities compared to prior.  QTc prolonged at 530.  Assessment/Plan Active Problems:   Dyspnea   Dyspnea likely multifactorial-O2 sats greater than 93% on room air.  No chest pain,  no cough.  Afebrile.  WBC 7.8.  Rules out for sepsis.  CTA negative for PE, shows trace bilateral pleural effusions, with compressive atelectasis and ?? superimposed pneumonia.  BNP unremarkable- 84, I do not appreciate significant pitting edema, per chart weight is stable. Hs troponin x 2 and EKG without remarkable abnormalities. -Continue IV vancomycin and cefepime, DC Vanco if MRSA negative. -CBC, BMP a.m. -Echocardiogram -Incentive spirometry -Will hold Lasix dosing for tonight-with contrast exposure, vancomycin and recent acute kidney injury - consider some gentle diuresis while hospitalized. -Check magnesium, phosphorus  Prolonged QTC-530.  ?  Compliance with Cymbalta. Potassium 3.2 -Check magnesium  Cardiac murmur -Follow-up echo  Recent colitis-improved.  Discharged home on cefdinir and metronidazole. -Hold cefdinir while on cefepime. -Continue metronidazole  Hypokalemia- 3.2 -Replete  Hypertension-stable. -Resume home Norvasc, hydralazine, Imdur, -Hold lisinopril 41m for now  ? Mild AKI on CKD 3-creatinine 1.27 today, creatinine down to 0.8 on recent hospitalization, otherwise baseline previously was 1.3-1.4. -BMP with contrast exposure, IV vancomycin,  - may need some IV diuresis prior to discharge  Depression, cerebellar degeneration-does not appear to be taking her Cymbalta.  Incidental CT finding - ascending thoracic aorta has a measured diameter of 4.0 x 4.0 cm. No evident dissection. There is aortic atherosclerosis as well as foci of coronary artery and great vessel calcification. Recommend annual imaging followup by CTA or MRA.  DVT prophylaxis: Enoxaparin Code Status: DNR-confirmed with patient at bedside, consistent  with prior documentation. Family Communication: None at bedside Disposition Plan: Per rounding team Consults called: None Admission status: Observation, telemetry   Jayana Kotula EArlyce DiceMD [facility]  06/07/2019, 8:57 PM

## 2019-06-07 NOTE — ED Provider Notes (Signed)
[facility] EMERGENCY DEPARTMENT Provider Note   CSN: [id number] Arrival date & time: 06/07/19  1318     History   Chief Complaint Chief Complaint  Patient presents with   Weakness   Shortness of Breath    HPI [name] is a 78 y.o. female with a hx of CKD, HTN, hyperlipidemia, GERD, colitis, and sleep apnea who presents to the emergency department via EMS for dyspnea that has been progressively worsening since hospital discharge.  Patient states that since leaving the hospital she has been having progressively worsening shortness of breath, it is constant, it is worse with any type of movement or when laying in the supine position.  She states she also feels generally weak, coughs sometimes, and that her feet are somewhat swollen..  She denies any fever, chills, chest pain, or syncope.  She states her abdominal discomfort is better since her last admission.     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[place])    Chronic kidney disease    Chronic knee pain    Depression    GERD (gastroesophageal reflux disease)    Hyperlipidemia    Hypertension    Thyroid disease     Patient Active Problem List   Diagnosis Date Noted   Diarrhea    Abnormal LFTs    Symptomatic anemia 06/01/2019   Nausea vomiting and diarrhea    Lesion of spleen    Diverticulitis 05/04/2019   Internal impingement of left shoulder 12/24/2017   Anxiety 04/20/2017   Cerebellar dysfunction 04/20/2017   Incontinence of urine 04/20/2017   UTI (urinary tract infection) 07/15/2016   Abdominal pain 07/15/2016   Colitis 07/15/2016   Syncope and collapse 04/26/2016   Lower urinary tract infectious disease 04/26/2016   Fall    Daytime somnolence 03/10/2016   Diplopia 03/10/2016   Insomnia 03/10/2016   Sleep apnea in adult 03/10/2016   Imbalance 03/07/2016   Migraine 03/07/2016   Tremor 03/07/2016     Pain of both shoulder joints 02/29/2016   Arthritis 09/24/2015   Constipation 06/05/2015   Depression 06/05/2015   Hypothyroid 06/05/2015   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      Home Medications    Prior to Admission medications   Medication Sig Start Date End Date Taking? Authorizing Provider  acetaminophen (TYLENOL) 325 MG tablet Take 2 tablets (650 mg total) by mouth every 6 (six) hours as needed for mild pain, fever or headache (or Fever >/= 101). 06/05/19   Roxan Hockey, MD  amLODipine (NORVASC) 10 MG tablet Take 1 tablet (10 mg total) by mouth daily. for blood pressure 06/05/19   Roxan Hockey, MD  aspirin EC 81 MG tablet Take 1 tablet (81 mg total) by mouth daily with breakfast. 06/05/19   Denton Brick, Courage, MD  atorvastatin (LIPITOR) 40 MG tablet Take 1 tablet (40 mg total) by mouth at bedtime. Do not restart  until instructed by MD 05/14/19   Orson Eva, MD  Bacillus Coagulans-Inulin (ALIGN PREBIOTIC-PROBIOTIC) 5-1.25 MG-GM CHEW Chew 1 tablet by mouth daily. 11/14/18   Isla Pence, MD  Calcium Carbonate-Vitamin D (CALTRATE 600+D PO) Take 1 tablet by mouth daily.    [provider]  cefdinir (OMNICEF) 300 MG capsule Take 1 capsule (300 mg total) by mouth 2 (two) times daily for 3 days. 06/05/19 06/08/19  Roxan Hockey, MD  cetirizine (ZYRTEC) 10 MG chewable tablet Chew 10 mg by mouth daily.    [provider]  DULoxetine (CYMBALTA) 60 MG capsule Take 1 capsule (60 mg total) by mouth daily. Patient taking  differently: Take 60 mg by mouth every morning.  04/27/19   Janora Norlander, DO  furosemide (LASIX) 20 MG tablet Take 1 tablet (20 mg total) by mouth daily as needed. As needed for swelling 08/25/17   Eustaquio Maize, MD  hydrALAZINE (APRESOLINE) 50 MG tablet Take 1 tablet (50 mg total) by mouth 3 (three) times daily. For BP 06/05/19   Emokpae, Courage, MD  isosorbide mononitrate (IMDUR) 30 MG 24 hr tablet Take 1 tablet (30 mg total) by mouth daily. 06/06/19   Roxan Hockey, MD  levothyroxine (SYNTHROID) 50 MCG tablet TAKE 1 TABLET ONCE DAILY Patient taking differently: Take 50 mcg by mouth daily before breakfast.  04/26/19   Ronnie Doss M, DO  lisinopril (ZESTRIL) 10 MG tablet Take 1 tablet (10 mg total) by mouth daily. 06/05/19   Roxan Hockey, MD  meclizine (ANTIVERT) 25 MG tablet Take 25 mg by mouth 3 (three) times daily as needed for dizziness.    [provider]  metroNIDAZOLE (FLAGYL) 500 MG tablet Take 1 tablet (500 mg total) by mouth 3 (three) times daily for 3 days. 06/05/19 06/08/19  Roxan Hockey, MD  Multiple Vitamin (MULTIVITAMIN WITH MINERALS) TABS tablet Take 1 tablet by mouth daily.    [provider]  omeprazole (PRILOSEC) 40 MG capsule TAKE 1 CAPSULE DAILY Patient taking differently: Take 40 mg by mouth daily before breakfast.  04/26/19   Ronnie Doss M, DO  ondansetron (ZOFRAN) 4 MG tablet Take 1 tablet (4 mg total) by mouth every 6 (six) hours as needed for nausea. 06/05/19   Roxan Hockey, MD  raloxifene (EVISTA) 60 MG tablet Take 1 tablet (60 mg total) by mouth daily. Patient taking differently: Take 60 mg by mouth every morning.  04/27/19   Janora Norlander, DO  atorvastatin (LIPITOR) 40 MG tablet Take 1 tablet (40 mg total) by mouth at bedtime. 11/05/18   Janora Norlander, DO  DULoxetine (CYMBALTA) 60 MG capsule Take 1 capsule (60 mg total) by mouth daily. 11/05/18   Janora Norlander, DO    Family History Family History    Problem Relation Age of Onset   Arthritis Sister    Hyperlipidemia Sister    Hypertension Sister    Cancer Brother    Diabetes Brother    Heart disease Brother     Social History Social History   Tobacco Use   Smoking status: Never Smoker   Smokeless tobacco: Never Used  Substance Use Topics   Alcohol use: No   Drug use: No     Allergies   Sulfa antibiotics, Codeine, Doxycycline, Erythromycin, Ibuprofen, Levaquin [levofloxacin], and Penicillins   Review of Systems Review of Systems  Constitutional: Positive for fatigue. Negative for chills and fever.  Respiratory: Positive for cough and shortness of breath.   Cardiovascular: Positive for leg swelling. Negative  for chest pain.  Gastrointestinal: Negative for abdominal pain, blood in stool and vomiting.  Genitourinary: Negative for dysuria.  Neurological: Positive for weakness (generalized). Negative for syncope.  All other systems reviewed and are negative.    Physical Exam Updated Vital Signs BP (!) 117/59 (BP Location: Right Arm)    Pulse 92    Temp 97.8 F (36.6 C) (Oral)    Resp (!) 22    Ht 5' 2"  (1.575 m)    Wt 84.4 kg    SpO2 93%    BMI 34.02 kg/m   Physical Exam Vitals signs and nursing note reviewed.  Constitutional:      Appearance: She is well-developed. She is not toxic-appearing.  HENT:     Head: Normocephalic and atraumatic.  Eyes:     Pupils: Pupils are equal, round, and reactive to light.  Neck:     Musculoskeletal: Neck supple.  Cardiovascular:     Rate and Rhythm: Normal rate and regular rhythm.  Pulmonary:     Effort: Tachypnea present.     Breath sounds: Decreased breath sounds (bibasilar) present. No wheezing or rales.     Comments: Speaking in short sentences.  SPO2 maintaining greater than 92% on room air. Abdominal:     General: There is no distension.     Palpations: Abdomen is soft.     Tenderness: There is no abdominal tenderness. There is no guarding or rebound.   Musculoskeletal:     Comments: 2+ pitting edema to the feet bilaterally.  Without overlying erythema/warmth or significant tenderness to palpation  Neurological:     Mental Status: She is alert.    ED Treatments / Results  Labs (all labs ordered are listed, but only abnormal results are displayed) Labs Reviewed  CBC WITH DIFFERENTIAL/PLATELET - Abnormal; Notable for the following components:      Result Value   RBC 3.00 (*)    Hemoglobin 9.3 (*)    HCT 29.6 (*)    RDW 15.6 (*)    All other components within normal limits  COMPREHENSIVE METABOLIC PANEL - Abnormal; Notable for the following components:   Potassium 3.2 (*)    Chloride 113 (*)    CO2 18 (*)    Creatinine, Ser 1.27 (*)    Calcium 8.3 (*)    Total Protein 6.0 (*)    Albumin 3.2 (*)    ALT 71 (*)    GFR calc non Af Amer 40 (*)    GFR calc Af Amer 46 (*)    All other components within normal limits  TROPONIN I (HIGH SENSITIVITY) - Abnormal; Notable for the following components:   Troponin I (High Sensitivity) 18 (*)    All other components within normal limits  SARS CORONAVIRUS 2 (TAT 6-24 HRS)  BRAIN NATRIURETIC PEPTIDE  URINALYSIS, ROUTINE W REFLEX MICROSCOPIC  TROPONIN I (HIGH SENSITIVITY)    EKG None  Radiology Dg Chest 2 View  Result Date: 06/07/2019 CLINICAL DATA:  Shortness of breath for 3 days EXAM: CHEST - 2 VIEW COMPARISON:  May 04, 2019 FINDINGS: The heart size is enlarged. The mediastinal contour is stable. Mild central pulmonary vascular congestion is identified unchanged. There is no focal pneumonia. Small bilateral posterior pleural effusions are identified. The bony structures are stable. IMPRESSION: Mild central pulmonary vascular congestion stable compared prior exam. No focal pneumonia. Small bilateral pleural effusions. Electronically Signed   By: Abelardo Diesel M.D.   On: 06/07/2019 15:08   Ct Angio Chest Pe W/cm &/or  Wo Cm  Result Date: 06/07/2019 CLINICAL DATA:  Shortness of  breath EXAM: CT ANGIOGRAPHY CHEST WITH CONTRAST TECHNIQUE: Multidetector CT imaging of the chest was performed using the standard protocol during bolus administration of intravenous contrast. Multiplanar CT image reconstructions and MIPs were obtained to evaluate the vascular anatomy. CONTRAST:  52m OMNIPAQUE IOHEXOL 350 MG/ML SOLN COMPARISON:  Chest radiograph June 07, 2019. Abdominal MRI May 27, 2019 FINDINGS: Cardiovascular: There is no demonstrable pulmonary embolus. Ascending thoracic aorta measures 4.0 x 4.0 cm in diameter. No dissection evident. There are scattered foci of calcification in the visualized great vessels. Note that the right innominate and left common carotid arteries arise as a common trunk, an anatomic variant. There is aortic atherosclerosis as well as foci of coronary artery calcification. There is no pericardial effusion or pericardial thickening. Mediastinum/Nodes: There is a subcentimeter nodular area in the thyroid. Per consensus guidelines, a nodule of this size does not warrant additional imaging surveillance. There is no appreciable thoracic adenopathy. 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 Lungs/Pleura: There are moderate pleural effusions bilaterally. There is consolidation in each lung base, in part due to compressive atelectasis. No consolidation noted elsewhere. There is mild atelectatic change of the apex in the left upper lobe. Upper Abdomen: In the visualized upper abdomen, there is a cystic appearing mass within the anterior spleen which measures 3.0 x 2.7 cm. 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 There is aortic atherosclerosis in the upper abdominal aorta. Visualized upper abdominal structures otherwise appear unremarkable. Musculoskeletal: There is degenerative change in the thoracic spine. There are no blastic or lytic bone lesions. No chest wall  lesions evident. Review of the MIP images confirms the above findings. IMPRESSION: 1.  No demonstrable pulmonary embolus. 2. Ascending thoracic aorta has a measured diameter of 4.0 x 4.0 cm. No evident dissection. There is aortic atherosclerosis as well as foci of coronary artery and great vessel calcification. Recommend annual imaging followup by CTA or MRA. This recommendation follows 2010 ACCF/AHA/AATS/ACR/ASA/SCA/SCAI/SIR/STS/SVM Guidelines for the Diagnosis and Management of Patients with Thoracic Aortic Disease. Circulation. 2010; 121:: B638-G536 Aortic aneurysm NOS (ICD10-I71.9). 3. Moderate pleural effusions bilaterally with compressive atelectasis in each lung base. A degree of superimposed pneumonia in the bases cannot be excluded. 4.  Much of the stomach is above the diaphragm. 5.  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 6.  No evident thoracic adenopathy. Aortic Atherosclerosis (ICD10-I70.0). Electronically Signed   By: WLowella GripIII M.D.   On: 06/07/2019 18:11    Procedures Procedures (including critical care time)  Medications Ordered in ED Medications - No data to display   Initial Impression / Assessment and Plan / ED Course  I have reviewed the triage vital signs and the nursing notes.  Pertinent labs & imaging results that were available during my care of the patient were reviewed by me and considered in my medical decision making (see chart for details).   Patient presents to the emergency department for progressively worsening shortness of breath since discharge from the hospital.  He is nontoxic-appearing, she does appear tachypneic and is speaking in short sentences but is maintaining her SPO2 greater than 92% on room air.  She has decreased breath sounds bibasilarly.  2+ pitting edema noted to the dorsum of the feet.  Abdomen nontender without peritoneal signs.  CBC: Anemia similar to prior.  No leukocytosis. CMP: Mild increase in  creatinine 1.27 today, last on  record 0.83, mild hypokalemia/hypocalcemia. Troponin: 18, 17, decreasing Initial EKG: NSR, no STEMI, repeat EKG w/ some artifact making interpretation less than ideal but initial reassuring.  Chest x-ray:Mild central pulmonary vascular congestion stable compared prior exam. No focal pneumonia. Small bilateral pleural effusions. BNP: 84.0  CTA obtained:  - No PE - Ascending thoracic aorta diameter 4.0x4.0 cm- no dissection- will need follow up imaging.  - Moderate pleural effusions bilaterally with compressive atelectasis in each lung base. A degree of superimposed pneumonia in the bases cannot be excluded.  Suspect her dyspnea is multifactorial- possibly related to fluid overload w/ pleural effusions & peripheral edema, also some concern for superimposed pneumonia given cough/dyspnea- will given dose of abx to cover for HCAP in the ED & discuss w/ hospitalist service for admission.   This is a shared visit with supervising physician Dr. Stark Jock who has independently evaluated patient & provided guidance in evaluation/management/disposition, in agreement with care   18:45: CONSULT: Discussed with hospitalist Dr. Denton Brick who accepts admission.   Tamara Stein was evaluated in Emergency Department on 06/07/2019 for the symptoms described in the history of present illness. He/she was evaluated in the context of the global COVID-19 pandemic, which necessitated consideration that the patient might be at risk for infection with the SARS-CoV-2 virus that causes COVID-19. Institutional protocols and algorithms that pertain to the evaluation of patients at risk for COVID-19 are in a state of rapid change based on information released by regulatory bodies including the CDC and federal and state organizations. These policies and algorithms were followed during the patient's care in the ED.   Final Clinical Impressions(s) / ED Diagnoses   Final diagnoses:  Shortness of breath  Pleural effusion, bilateral   Healthcare-associated pneumonia    ED Discharge Orders    None       Amaryllis Dyke, PA-C 06/07/19 1917    Veryl Speak, MD 06/07/19 2235

## 2019-06-07 NOTE — ED Triage Notes (Signed)
EMS reprots pt was recenlty admitted for colitis.  Reports increased weakness and sob over the past 3 days that she has been home.  Denies fever.  Pt not on o2 at home.  EMS says pts o2 sat was 96% when they arrived.  EMS placed pt on o2 at 4liters to see if it helped with labored respirations.  EMS started 20g iv in r ac.

## 2019-06-07 NOTE — ED Notes (Signed)
Pt in ct 

## 2019-06-07 NOTE — Progress Notes (Signed)
Pharmacy Antibiotic Note  Tamara Stein is a 78 y.o. female admitted on 06/07/2019 with pneumonia.  Pharmacy has been consulted for vanc/cefepime dosing.  Pt was recently discharged for colitis. She got 5 days of ceftriaxone and metronidazole before being discharged on cefdinir/flagyl. She presented today with SOB due to possibly fluid overload +- PNA. Empiric vanc/cefepime have been ordered.   Plan:  Vanc 1g IV q24>>AUC 515, scr 1.27 Cefepime 2g IV q12 F/u if level is needed  Height: 5' 2"  (157.5 cm) Weight: 186 lb (84.4 kg) IBW/kg (Calculated) : 50.1  Temp (24hrs), Avg:97.8 F (36.6 C), Min:97.8 F (36.6 C), Max:97.8 F (36.6 C)  Recent Labs  Lab 06/01/19 0451 06/02/19 0451 06/03/19 0458 06/04/19 0648 06/07/19 1530  WBC 22.7* 11.9* 9.1 7.0 7.8  CREATININE 1.26* 0.99  --  0.83 1.27*    Estimated Creatinine Clearance: 36.2 mL/min (A) (by C-G formula based on SCr of 1.27 mg/dL (H)).    Allergies  Allergen Reactions  . Sulfa Antibiotics Anaphylaxis    Facial swelling   . Codeine Other (See Comments)    BLACK OUTS  . Doxycycline Nausea Only  . Erythromycin Swelling  . Ibuprofen Other (See Comments)    Stomach pain, muscle spasm, GI bleeding  . Levaquin [Levofloxacin] Other (See Comments)    Insomnia/trouble breathing  . Penicillins Diarrhea    Has patient had a PCN reaction causing immediate rash, facial/tongue/throat swelling, SOB or lightheadedness with hypotension: No Has patient had a PCN reaction causing severe rash involving mucus membranes or skin necrosis: No Has patient had a PCN reaction that required hospitalization Unknown Has patient had a PCN reaction occurring within the last 10 years: No If all of the above answers are "NO", then may proceed with Cephalosporin use.     Antimicrobials this admission: 10/20 ceftriaxone/flagyl>10/25 10/27 cefepime>> 10/27 vanc>>  Dose adjustments this admission:   Microbiology results: 10/20 urine>>pan sens  e.coli  Onnie Boer, PharmD, BCIDP, AAHIVP, CPP Infectious Disease Pharmacist 06/07/2019 7:49 PM

## 2019-06-07 NOTE — ED Notes (Signed)
ED TO INPATIENT HANDOFF REPORT  ED Nurse Name and Phone #: [phone]  S Name/Age/Gender Tamara Stein 78 y.o. female Room/Bed: APA18/APA18  Code Status   Code Status: Prior  Home/SNF/Other Home Patient oriented to: situation Is this baseline? Yes   Triage Complete: Triage complete  Chief Complaint shortness of breath  Triage Note EMS reprots pt was recenlty admitted for colitis.  Reports increased weakness and sob over the past 3 days that she has been home.  Denies fever.  Pt not on o2 at home.  EMS says pts o2 sat was 96% when they arrived.  EMS placed pt on o2 at 4liters to see if it helped with labored respirations.  EMS started 20g iv in r ac.     Allergies Allergies  Allergen Reactions  . Sulfa Antibiotics Anaphylaxis    Facial swelling   . Codeine Other (See Comments)    BLACK OUTS  . Doxycycline Nausea Only  . Erythromycin Swelling  . Ibuprofen Other (See Comments)    Stomach pain, muscle spasm, GI bleeding  . Levaquin [Levofloxacin] Other (See Comments)    Insomnia/trouble breathing  . Penicillins Diarrhea    Has patient had a PCN reaction causing immediate rash, facial/tongue/throat swelling, SOB or lightheadedness with hypotension: No Has patient had a PCN reaction causing severe rash involving mucus membranes or skin necrosis: No Has patient had a PCN reaction that required hospitalization Unknown Has patient had a PCN reaction occurring within the last 10 years: No If all of the above answers are "NO", then may proceed with Cephalosporin use.     Level of Care/Admitting Diagnosis ED Disposition    ED Disposition Condition [facility] Area: [facility] [[id number]]  Level of Care: Telemetry [5]  Covid Evaluation: Asymptomatic Screening Protocol (No Symptoms)  Diagnosis: Dyspnea [387564]  Admitting Physician: Kara Pacer  Attending Physician: Bethena Roys Nessa.Cuff  PT Class (Do Not Modify):  Observation [104]  PT Acc Code (Do Not Modify): Observation [10022]       B Medical/Surgery History Past Medical History:  Diagnosis Date  . Allergy   . Anxiety   . Cataract   . 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 Chronic kidney disease   . Chronic knee pain   . Depression   . GERD (gastroesophageal reflux disease)   . Hyperlipidemia   . Hypertension   . Thyroid disease    Past Surgical History:  Procedure Laterality Date  . ABDOMINAL HYSTERECTOMY    . CHOLECYSTECTOMY    . MOUTH SURGERY    . RIGHT ELBOW       A IV Location/Drains/Wounds Patient Lines/Drains/Airways Status   Active Line/Drains/Airways    Name:   Placement date:   Placement time:   Site:   Days:   Peripheral IV 06/07/19 Right Antecubital   06/07/19    1725    Antecubital   less than 1   External Urinary Catheter   06/07/19    1906    -   less than 1          Intake/Output Last 24 hours No intake or output data in the 24 hours ending 06/07/19 2025  Labs/Imaging Results for orders placed or performed during the hospital encounter of 06/07/19 (from the past 48 hour(s))  Urinalysis, Routine w reflex microscopic     Status: Abnormal   Collection Time: 06/07/19  3:17 PM  Result Value Ref Range   Color, Urine YELLOW YELLOW  APPearance HAZY (A) CLEAR   Specific Gravity, Urine >1.046 (H) 1.005 - 1.030   pH 5.0 5.0 - 8.0   Glucose, UA NEGATIVE NEGATIVE mg/dL   Hgb urine dipstick NEGATIVE NEGATIVE   Bilirubin Urine NEGATIVE NEGATIVE   Ketones, ur 5 (A) NEGATIVE mg/dL   Protein, ur 100 (A) NEGATIVE mg/dL   Nitrite NEGATIVE NEGATIVE   Leukocytes,Ua MODERATE (A) NEGATIVE   RBC / HPF 6-10 0 - 5 RBC/hpf   WBC, UA 6-10 0 - 5 WBC/hpf   Bacteria, UA RARE (A) NONE SEEN   Squamous Epithelial / LPF 6-10 0 - 5   Mucus PRESENT     Comment: Performed at [facility], [address] [place], [place] [address]  CBC with Differential     Status: Abnormal   Collection Time: 06/07/19  3:30 PM  Result Value Ref  Range   WBC 7.8 4.0 - 10.5 K/uL   RBC 3.00 (L) 3.87 - 5.11 MIL/uL   Hemoglobin 9.3 (L) 12.0 - 15.0 g/dL   HCT 29.6 (L) 36.0 - 46.0 %   MCV 98.7 80.0 - 100.0 fL   MCH 31.0 26.0 - 34.0 pg   MCHC 31.4 30.0 - 36.0 g/dL   RDW 15.6 (H) 11.5 - 15.5 %   Platelets 196 150 - 400 K/uL   nRBC 0.0 0.0 - 0.2 %   Neutrophils Relative % 73 %   Neutro Abs 5.8 1.7 - 7.7 K/uL   Lymphocytes Relative 11 %   Lymphs Abs 0.8 0.7 - 4.0 K/uL   Monocytes Relative 12 %   Monocytes Absolute 0.9 0.1 - 1.0 K/uL   Eosinophils Relative 2 %   Eosinophils Absolute 0.1 0.0 - 0.5 K/uL   Basophils Relative 1 %   Basophils Absolute 0.0 0.0 - 0.1 K/uL   Immature Granulocytes 1 %   Abs Immature Granulocytes 0.04 0.00 - 0.07 K/uL    Comment: Performed at [facility], [address] [place], [place] [address]  Comprehensive metabolic panel     Status: Abnormal   Collection Time: 06/07/19  3:30 PM  Result Value Ref Range   Sodium 140 135 - 145 mmol/L   Potassium 3.2 (L) 3.5 - 5.1 mmol/L   Chloride 113 (H) 98 - 111 mmol/L   CO2 18 (L) 22 - 32 mmol/L   Glucose, Bld 93 70 - 99 mg/dL   BUN 15 8 - 23 mg/dL   Creatinine, Ser 1.27 (H) 0.44 - 1.00 mg/dL   Calcium 8.3 (L) 8.9 - 10.3 mg/dL   Total Protein 6.0 (L) 6.5 - 8.1 g/dL   Albumin 3.2 (L) 3.5 - 5.0 g/dL   AST 22 15 - 41 U/L   Stein 71 (H) 0 - 44 U/L   Alkaline Phosphatase 72 38 - 126 U/L   Total Bilirubin 0.7 0.3 - 1.2 mg/dL   GFR calc non Af Amer 40 (L) >60 mL/min   GFR calc Af Amer 46 (L) >60 mL/min   Anion gap 9 5 - 15    Comment: Performed at [facility], [address], [place],  [address]  Brain natriuretic peptide     Status: None   Collection Time: 06/07/19  3:30 PM  Result Value Ref Range   B Natriuretic Peptide 84.0 0.0 - 100.0 pg/mL    Comment: Performed at [facility], [address], [place], [place] [address]  Troponin I (High Sensitivity)     Status: Abnormal   Collection Time: 06/07/19  3:30 PM  Result Value Ref Range   Troponin I (High  Sensitivity) 18 (H) <18 ng/L    Comment: (NOTE) Elevated high sensitivity troponin I (hsTnI) values and significant  changes across serial measurements may suggest ACS but many other  chronic and acute conditions are known to elevate hsTnI results.  Refer to the "Links" section for chest pain algorithms and additional  guidance. Performed at [facility], [address], [place], [place] [address]   Troponin I (High Sensitivity)     Status: None   Collection Time: 06/07/19  5:18 PM  Result Value Ref Range   Troponin I (High Sensitivity) 17 <18 ng/L    Comment: (NOTE) Elevated high sensitivity troponin I (hsTnI) values and significant  changes across serial measurements may suggest ACS but many other  chronic and acute conditions are known to elevate hsTnI results.  Refer to the "Links" section for chest pain algorithms and additional  guidance. Performed at [facility], [address], [place], [place] [address]    Dg Chest 2 View  Result Date: 06/07/2019 CLINICAL DATA:  Shortness of breath for 3 days EXAM: CHEST - 2 VIEW COMPARISON:  May 04, 2019 FINDINGS: The heart size is enlarged. The mediastinal contour is stable. Mild central pulmonary vascular congestion is identified unchanged. There is no focal pneumonia. Small bilateral posterior pleural effusions are identified. The bony structures are stable. IMPRESSION: Mild central pulmonary vascular congestion stable compared prior exam. No focal pneumonia. Small bilateral pleural effusions. Electronically Signed   By: Abelardo Diesel M.D.   On: 06/07/2019 15:08   Ct Angio Chest Pe W/cm &/or Wo Cm  Result Date: 06/07/2019 CLINICAL DATA:  Shortness of breath EXAM: CT ANGIOGRAPHY CHEST WITH CONTRAST TECHNIQUE: Multidetector CT imaging of the chest was performed using the standard protocol during bolus administration of intravenous contrast. Multiplanar CT image reconstructions and MIPs were obtained to evaluate the vascular anatomy.  CONTRAST:  58m OMNIPAQUE IOHEXOL 350 MG/ML SOLN COMPARISON:  Chest radiograph June 07, 2019. Abdominal MRI May 27, 2019 FINDINGS: Cardiovascular: There is no demonstrable pulmonary embolus. Ascending thoracic aorta measures 4.0 x 4.0 cm in diameter. No dissection evident. There are scattered foci of calcification in the visualized great vessels. Note that the right innominate and left common carotid arteries arise as a common trunk, an anatomic variant. There is aortic atherosclerosis as well as foci of coronary artery calcification. There is no pericardial effusion or pericardial thickening. Mediastinum/Nodes: There is a subcentimeter nodular area in the thyroid. Per consensus guidelines, a nodule of this size does not warrant additional imaging surveillance. There is no appreciable thoracic adenopathy. 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 Lungs/Pleura: There are moderate pleural effusions bilaterally. There is consolidation in each lung base, in part due to compressive atelectasis. No consolidation noted elsewhere. There is mild atelectatic change of the apex in the left upper lobe. Upper Abdomen: In the visualized upper abdomen, there is a cystic appearing mass within the anterior spleen which measures 3.0 x 2.7 cm. 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 There is aortic atherosclerosis in the upper abdominal aorta. Visualized upper abdominal structures otherwise appear unremarkable. Musculoskeletal: There is degenerative change in the thoracic spine. There are no blastic or lytic bone lesions. No chest wall lesions evident. Review of the MIP images confirms the above findings. IMPRESSION: 1.  No demonstrable pulmonary embolus. 2. Ascending thoracic aorta has a  measured diameter of 4.0 x 4.0 cm. No evident dissection. There is aortic atherosclerosis as well as foci of coronary artery and  great vessel calcification. Recommend annual imaging followup by CTA or MRA. This recommendation follows 2010 ACCF/AHA/AATS/ACR/ASA/SCA/SCAI/SIR/STS/SVM Guidelines for the Diagnosis and Management of Patients with Thoracic Aortic Disease. Circulation. 2010; 121: E158-X094. Aortic aneurysm NOS (ICD10-I71.9). 3. Moderate pleural effusions bilaterally with compressive atelectasis in each lung base. A degree of superimposed pneumonia in the bases cannot be excluded. 4.  Much of the stomach is above the diaphragm. 5.  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 6.  No evident thoracic adenopathy. Aortic Atherosclerosis (ICD10-I70.0). Electronically Signed   By: Lowella Grip III M.D.   On: 06/07/2019 18:11    Pending Labs Unresulted Labs (From admission, onward)    Start     Ordered   06/07/19 1930  MRSA PCR Screening  Once,   STAT     06/07/19 1929   06/07/19 1830  SARS CORONAVIRUS 2 (TAT 6-24 HRS) Nasopharyngeal Nasopharyngeal Swab  (Symptomatic/High Risk of Exposure/Tier 1 Patients Labs with Precautions)  Once,   STAT    Question Answer Comment  Is this test for diagnosis or screening Diagnosis of ill patient   Symptomatic for COVID-19 as defined by CDC Yes   Date of Symptom Onset 06/05/2019   Hospitalized for COVID-19 Yes   Admitted to ICU for COVID-19 No   Previously tested for COVID-19 Yes   Resident in a congregate (group) care setting No   Employed in healthcare setting No   Pregnant No      06/07/19 1830   Signed and Held  Basic metabolic panel  Tomorrow morning,   R     Signed and Held          Vitals/Pain Today's Vitals   06/07/19 1700 06/07/19 1702 06/07/19 1900 06/07/19 2000  BP: (!) 148/81  135/74 (!) 143/66  Pulse: (!) 102  (!) 101 99  Resp: (!) 26  (!) 22 (!) 23  Temp:      TempSrc:      SpO2: 95%  96% 95%  Weight:      Height:      PainSc:  0-No pain      Isolation Precautions No active isolations  Medications Medications  vancomycin (VANCOCIN) IVPB 1000  mg/200 mL premix (1,000 mg Intravenous New Bag/Given 06/07/19 2023)  ceFEPIme (MAXIPIME) 2 g in sodium chloride 0.9 % 100 mL IVPB (has no administration in time range)  vancomycin (VANCOCIN) IVPB 1000 mg/200 mL premix (has no administration in time range)  iohexol (OMNIPAQUE) 350 MG/ML injection 100 mL (75 mLs Intravenous Contrast Given 06/07/19 1737)  ceFEPIme (MAXIPIME) 2 g in sodium chloride 0.9 % 100 mL IVPB (0 g Intravenous Stopped 06/07/19 2023)    Mobility walks with person assist High fall risk   Focused Assessments Pulmonary Assessment Handoff:  Lung sounds: Bilateral Breath Sounds: Clear O2 Device: Room Air        R Recommendations: See Admitting Provider Note  Report given to:   Additional Notes: Pt is on intermittent o2 2-3 lpm. Pt has garbled speech and hard to understand at times.

## 2019-06-08 ENCOUNTER — Observation Stay (HOSPITAL_BASED_OUTPATIENT_CLINIC_OR_DEPARTMENT_OTHER): Payer: Medicare Other

## 2019-06-08 ENCOUNTER — Ambulatory Visit: Payer: Medicare Other | Admitting: Family Medicine

## 2019-06-08 DIAGNOSIS — K5150 Left sided colitis without complications: Secondary | ICD-10-CM | POA: Diagnosis present

## 2019-06-08 DIAGNOSIS — R0600 Dyspnea, unspecified: Secondary | ICD-10-CM | POA: Diagnosis not present

## 2019-06-08 DIAGNOSIS — Z7401 Bed confinement status: Secondary | ICD-10-CM | POA: Diagnosis not present

## 2019-06-08 DIAGNOSIS — H269 Unspecified cataract: Secondary | ICD-10-CM | POA: Diagnosis present

## 2019-06-08 DIAGNOSIS — I10 Essential (primary) hypertension: Secondary | ICD-10-CM | POA: Diagnosis not present

## 2019-06-08 DIAGNOSIS — J90 Pleural effusion, not elsewhere classified: Secondary | ICD-10-CM | POA: Diagnosis present

## 2019-06-08 DIAGNOSIS — D631 Anemia in chronic kidney disease: Secondary | ICD-10-CM | POA: Diagnosis present

## 2019-06-08 DIAGNOSIS — E785 Hyperlipidemia, unspecified: Secondary | ICD-10-CM | POA: Diagnosis present

## 2019-06-08 DIAGNOSIS — F419 Anxiety disorder, unspecified: Secondary | ICD-10-CM | POA: Diagnosis present

## 2019-06-08 DIAGNOSIS — G319 Degenerative disease of nervous system, unspecified: Secondary | ICD-10-CM | POA: Diagnosis present

## 2019-06-08 DIAGNOSIS — R0602 Shortness of breath: Secondary | ICD-10-CM

## 2019-06-08 DIAGNOSIS — Z66 Do not resuscitate: Secondary | ICD-10-CM | POA: Diagnosis present

## 2019-06-08 DIAGNOSIS — Z79899 Other long term (current) drug therapy: Secondary | ICD-10-CM | POA: Diagnosis not present

## 2019-06-08 DIAGNOSIS — Z881 Allergy status to other antibiotic agents status: Secondary | ICD-10-CM | POA: Diagnosis not present

## 2019-06-08 DIAGNOSIS — K219 Gastro-esophageal reflux disease without esophagitis: Secondary | ICD-10-CM | POA: Diagnosis present

## 2019-06-08 DIAGNOSIS — Z79890 Hormone replacement therapy (postmenopausal): Secondary | ICD-10-CM | POA: Diagnosis not present

## 2019-06-08 DIAGNOSIS — Z882 Allergy status to sulfonamides status: Secondary | ICD-10-CM | POA: Diagnosis not present

## 2019-06-08 DIAGNOSIS — Z888 Allergy status to other drugs, medicaments and biological substances status: Secondary | ICD-10-CM | POA: Diagnosis not present

## 2019-06-08 DIAGNOSIS — K449 Diaphragmatic hernia without obstruction or gangrene: Secondary | ICD-10-CM | POA: Diagnosis present

## 2019-06-08 DIAGNOSIS — Z20828 Contact with and (suspected) exposure to other viral communicable diseases: Secondary | ICD-10-CM | POA: Diagnosis present

## 2019-06-08 DIAGNOSIS — N183 Chronic kidney disease, stage 3 unspecified: Secondary | ICD-10-CM | POA: Diagnosis present

## 2019-06-08 DIAGNOSIS — J189 Pneumonia, unspecified organism: Secondary | ICD-10-CM | POA: Diagnosis present

## 2019-06-08 DIAGNOSIS — R457 State of emotional shock and stress, unspecified: Secondary | ICD-10-CM | POA: Diagnosis not present

## 2019-06-08 DIAGNOSIS — R4182 Altered mental status, unspecified: Secondary | ICD-10-CM | POA: Diagnosis not present

## 2019-06-08 DIAGNOSIS — E039 Hypothyroidism, unspecified: Secondary | ICD-10-CM | POA: Diagnosis present

## 2019-06-08 DIAGNOSIS — Z880 Allergy status to penicillin: Secondary | ICD-10-CM | POA: Diagnosis not present

## 2019-06-08 DIAGNOSIS — I129 Hypertensive chronic kidney disease with stage 1 through stage 4 chronic kidney disease, or unspecified chronic kidney disease: Secondary | ICD-10-CM | POA: Diagnosis present

## 2019-06-08 DIAGNOSIS — Z993 Dependence on wheelchair: Secondary | ICD-10-CM | POA: Diagnosis not present

## 2019-06-08 DIAGNOSIS — Z9049 Acquired absence of other specified parts of digestive tract: Secondary | ICD-10-CM | POA: Diagnosis not present

## 2019-06-08 DIAGNOSIS — Y95 Nosocomial condition: Secondary | ICD-10-CM | POA: Diagnosis present

## 2019-06-08 DIAGNOSIS — F329 Major depressive disorder, single episode, unspecified: Secondary | ICD-10-CM | POA: Diagnosis present

## 2019-06-08 LAB — BASIC METABOLIC PANEL
Anion gap: 8 (ref 5–15)
BUN: 15 mg/dL (ref 8–23)
CO2: 19 mmol/L — ABNORMAL LOW (ref 22–32)
Calcium: 8.2 mg/dL — ABNORMAL LOW (ref 8.9–10.3)
Chloride: 115 mmol/L — ABNORMAL HIGH (ref 98–111)
Creatinine, Ser: 1.2 mg/dL — ABNORMAL HIGH (ref 0.44–1.00)
GFR calc Af Amer: 50 mL/min — ABNORMAL LOW (ref >60)
GFR calc non Af Amer: 43 mL/min — ABNORMAL LOW (ref >60)
Glucose, Bld: 110 mg/dL — ABNORMAL HIGH (ref 70–99)
Potassium: 3.4 mmol/L — ABNORMAL LOW (ref 3.5–5.1)
Sodium: 142 mmol/L (ref 135–145)

## 2019-06-08 LAB — ECHOCARDIOGRAM COMPLETE
Height: 62 in
Weight: 3273.39 oz

## 2019-06-08 LAB — MRSA PCR SCREENING: MRSA by PCR: NEGATIVE

## 2019-06-08 LAB — SARS CORONAVIRUS 2 (TAT 6-24 HRS): SARS Coronavirus 2: NEGATIVE

## 2019-06-08 MED ORDER — GUAIFENESIN ER 600 MG PO TB12
600.0000 mg | ORAL_TABLET | Freq: Two times a day (BID) | ORAL | Status: DC
  Administered 2019-06-08 – 2019-06-09 (×3): 600 mg via ORAL
  Filled 2019-06-08 (×3): qty 1

## 2019-06-08 NOTE — Clinical Social Work Note (Signed)
Hospital bed was ordered through [facility] [place].     Ross Hefferan, Clydene Pugh, LCSW

## 2019-06-08 NOTE — Progress Notes (Signed)
Patient Demographics:    Tamara Stein, is a 78 y.o. female, DOB - 12/20/1940, LGX:[id number]  Admit date - 06/07/2019   Admitting Physician Ejiroghene Arlyce Dice, MD  Outpatient Primary MD for the patient is Janora Norlander, DO  LOS - 0   Chief Complaint  Patient presents with   Weakness   Shortness of Breath        Subjective:    Tamara Stein today has no fevers, no emesis,  No chest pain, shortness of breath persist slight cough  Assessment  & Plan :    Active Problems:   Dyspnea  Brief summary 78 y.o. female with medical history significant for cerebellar degeneration, hypothyroidism, hypertension, depression admitted with significant dyspnea on 10/27/202020  A/p 1)Possible HCAP with  bilateral pleural effusions with dyspnea and respiratory concerns---slight cough and shortness of breath persist imaging studies with effusions and possible pneumonia and the patient was recently hospitalized -MRSA PCR negative stop vancomycin -Continue IV cefepime -Continue mucolytics   2)Elevated LFTs--- review of records shows intermittent LFTs elevation in the past--Had imaging work-up previously, -Discussed with Dr. Laural Golden from GI service, -ALT is down to76fom 530 and AST is down to 251fm 313 -Patient is status post prior cholecystectomy -Acute viral hepatitis profile is negative -She takes Lipitor  3) CKD stage - III, creatinine currently 1.2 which is close to baseline, renally adjust medications, avoid nephrotoxic agents / dehydration /hypotension  4)resolving left-sided colitis-continue IV cefepime, and Flagyl  5)Depression and anxiety-stable continue Cymbalta 60 mg daily  6)HTN-continue amlodipine 10 mg daily,hydralazine/isosorbide combo,  -Lisinopril on hold  7)Acute on chronic chronic anemia-Hemoccult blood negative, baseline hemoglobin recently around 11,  hemoglobin down to9.3   - no evidence of ongoing bleeding at this time elevated MCV noted -Recent flexible sigmoidoscopy without evidence of bleeding  8)Hypothyroidism-continue levothyroxine 50 mcg daily  9)GERD/Hiatal Hernia- continue Protonix  10)Cerebellar degeneration--patient has significant challenges with mobility related activities of daily living, she is wheelchair-bound, she gets from bed to wheelchair by sliding --Recent PT eval appreciated, no further PT recommended as patient is at baseline -Patient will need hospital bed/DME to help with transfers and positional change  Disposition/Need for in-Hospital Stay- patient unable to be discharged at this time due to --- possible HCAP with shortness of breath continue IV antibiotics  Code Status : DNR  Family Communication:   NA (patient is alert, awake and coherent)   Disposition Plan  : TBD  Consults  :  Na  DVT Prophylaxis  :  Lovenox -   Lab Results  Component Value Date   PLT 196 06/07/2019    Inpatient Medications  Scheduled Meds:  amLODipine  10 mg Oral Daily   aspirin EC  81 mg Oral Q breakfast   atorvastatin  40 mg Oral QHS   enoxaparin (LOVENOX) injection  40 mg Subcutaneous QHS   guaiFENesin  600 mg Oral BID   hydrALAZINE  50 mg Oral TID   isosorbide mononitrate  30 mg Oral Daily   levothyroxine  50 mcg Oral QAC breakfast   metroNIDAZOLE  500 mg Oral TID   pantoprazole  40 mg Oral Daily   raloxifene  60 mg Oral q morning - 10a   Continuous Infusions:  ceFEPime (MAXIPIME) IV  2 g (06/08/19 0943)   PRN Meds:.acetaminophen **OR** acetaminophen, ondansetron **OR** ondansetron (ZOFRAN) IV    Anti-infectives (From admission, onward)   Start     Dose/Rate Route Frequency Ordered Stop   06/08/19 2000  vancomycin (VANCOCIN) IVPB 1000 mg/200 mL premix  Status:  Discontinued     1,000 mg 200 mL/hr over 60 Minutes Intravenous Every 24 hours 06/07/19 1941 06/08/19 0905   06/08/19 0800   ceFEPIme (MAXIPIME) 2 g in sodium chloride 0.9 % 100 mL IVPB     2 g 200 mL/hr over 30 Minutes Intravenous Every 12 hours 06/07/19 1941     06/07/19 2245  metroNIDAZOLE (FLAGYL) tablet 500 mg     500 mg Oral 3 times daily 06/07/19 2239     06/07/19 2000  vancomycin (VANCOCIN) IVPB 1000 mg/200 mL premix     1,000 mg 200 mL/hr over 60 Minutes Intravenous  Once 06/07/19 1917 06/07/19 2146   06/07/19 1930  ceFEPIme (MAXIPIME) 2 g in sodium chloride 0.9 % 100 mL IVPB     2 g 200 mL/hr over 30 Minutes Intravenous  Once 06/07/19 1917 06/07/19 2023        Objective:   Vitals:   06/08/19 0522 06/08/19 0800 06/08/19 1126 06/08/19 1300  BP: (!) 144/79   129/74  Pulse: (!) 103   95  Resp: 20 (!) 21  (!) 24  Temp: 98.6 F (37 C)   98 F (36.7 C)  TempSrc: Oral   Oral  SpO2: 97%  98% 99%  Weight:      Height:        Wt Readings from Last 3 Encounters:  06/07/19 92.8 kg  05/31/19 86.3 kg  05/07/19 87 kg    Intake/Output Summary (Last 24 hours) at 06/08/2019 1726 Last data filed at 06/08/2019 1300 Gross per 24 hour  Intake 300 ml  Output --  Net 300 ml    Physical Exam Gen:- Awake Alert, conversational dyspnea HEENT:- [place].AT, No sclera icterus Neck-Supple Neck,No JVD,.  Lungs-diminished in bases with few scattered rhonchi CV- S1, S2 normal, regular  Abd-  +ve B.Sounds, Abd Soft, No tenderness,    Extremity/Skin:- No  edema, pedal pulses present  Psych-affect is appropriate, oriented x3 Neuro-generalized weakness, wheelchair and bedbound at baseline , no new focal deficits, no tremors   Data Review:   Micro Results Recent Results (from the past 240 hour(s))  Culture, Urine     Status: Abnormal   Collection Time: 05/31/19  2:16 AM   Specimen: Urine, Clean Catch  Result Value Ref Range Status   Specimen Description   Final    URINE, CLEAN CATCH Performed at [facility], [address], [place], [place] [address]    Special Requests   Final    NONE Performed at [facility], [address], [place], [place] [address]    Culture >=100,000 COLONIES/mL ESCHERICHIA COLI (A)  Final   Report Status 06/02/2019 FINAL  Final   Organism ID, Bacteria ESCHERICHIA COLI (A)  Final      Susceptibility   Escherichia coli - MIC*    AMPICILLIN 8 SENSITIVE Sensitive     CEFAZOLIN <=4 SENSITIVE Sensitive     CEFTRIAXONE <=1 SENSITIVE Sensitive     CIPROFLOXACIN <=0.25 SENSITIVE Sensitive     GENTAMICIN <=1 SENSITIVE Sensitive     IMIPENEM <=0.25 SENSITIVE Sensitive     NITROFURANTOIN <=16 SENSITIVE Sensitive     TRIMETH/SULFA <=20 SENSITIVE Sensitive     AMPICILLIN/SULBACTAM 4 SENSITIVE  Sensitive     PIP/TAZO <=4 SENSITIVE Sensitive     Extended ESBL NEGATIVE Sensitive     * >=100,000 COLONIES/mL ESCHERICHIA COLI  SARS CORONAVIRUS 2 (TAT 6-24 HRS) Nasopharyngeal Nasopharyngeal Swab     Status: None   Collection Time: 05/31/19  3:19 AM   Specimen: Nasopharyngeal Swab  Result Value Ref Range Status   SARS Coronavirus 2 NEGATIVE NEGATIVE Final    Comment: (NOTE) SARS-CoV-2 target nucleic acids are NOT DETECTED. The SARS-CoV-2 RNA is generally detectable in upper and lower respiratory specimens during the acute phase of infection. Negative results do not preclude SARS-CoV-2 infection, do not rule out co-infections with other pathogens, and should not be used as the sole basis for treatment or other patient management decisions. Negative results must be combined with clinical observations, patient history, and epidemiological information. The expected result is Negative. Fact Sheet for Patients: SugarRoll.be Fact Sheet for Healthcare Providers: https://www.woods-mathews.com/ This test is not yet approved or cleared by the [place] FDA and  has been authorized for detection and/or diagnosis of SARS-CoV-2 by FDA under an Emergency Use Authorization (EUA). This EUA will remain  in effect (meaning this test can be used) for  the duration of the COVID-19 declaration under Section 56 4(b)(1) of the Act, 21 U.S.C. section 360bbb-3(b)(1), unless the authorization is terminated or revoked sooner. Performed at [facility], [place] [address] [place], [place] [address]   MRSA PCR Screening     Status: None   Collection Time: 06/08/19  5:03 AM   Specimen: Nasal Mucosa; Nasopharyngeal  Result Value Ref Range Status   MRSA by PCR NEGATIVE NEGATIVE Final    Comment:        The GeneXpert MRSA Assay (FDA approved for NASAL specimens only), is one component of a comprehensive MRSA colonization surveillance program. It is not intended to diagnose MRSA infection nor to guide or monitor treatment for MRSA infections. Performed at [facility], [address], [place], [place] [address]     Radiology Reports Dg Chest 2 View  Result Date: 06/07/2019 CLINICAL DATA:  Shortness of breath for 3 days EXAM: CHEST - 2 VIEW COMPARISON:  May 04, 2019 FINDINGS: The heart size is enlarged. The mediastinal contour is stable. Mild central pulmonary vascular congestion is identified unchanged. There is no focal pneumonia. Small bilateral posterior pleural effusions are identified. The bony structures are stable. IMPRESSION: Mild central pulmonary vascular congestion stable compared prior exam. No focal pneumonia. Small bilateral pleural effusions. Electronically Signed   By: Abelardo Diesel M.D.   On: 06/07/2019 15:08   Ct Angio Chest Pe W/cm &/or Wo Cm  Result Date: 06/07/2019 CLINICAL DATA:  Shortness of breath EXAM: CT ANGIOGRAPHY CHEST WITH CONTRAST TECHNIQUE: Multidetector CT imaging of the chest was performed using the standard protocol during bolus administration of intravenous contrast. Multiplanar CT image reconstructions and MIPs were obtained to evaluate the vascular anatomy. CONTRAST:  70m OMNIPAQUE IOHEXOL 350 MG/ML SOLN COMPARISON:  Chest radiograph June 07, 2019. Abdominal MRI May 27, 2019 FINDINGS:  Cardiovascular: There is no demonstrable pulmonary embolus. Ascending thoracic aorta measures 4.0 x 4.0 cm in diameter. No dissection evident. There are scattered foci of calcification in the visualized great vessels. Note that the right innominate and left common carotid arteries arise as a common trunk, an anatomic variant. There is aortic atherosclerosis as well as foci of coronary artery calcification. There is no pericardial effusion or pericardial thickening. Mediastinum/Nodes: There is a subcentimeter nodular area in the  thyroid. Per consensus guidelines, a nodule of this size does not warrant additional imaging surveillance. There is no appreciable thoracic adenopathy. 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 Lungs/Pleura: There are moderate pleural effusions bilaterally. There is consolidation in each lung base, in part due to compressive atelectasis. No consolidation noted elsewhere. There is mild atelectatic change of the apex in the left upper lobe. Upper Abdomen: In the visualized upper abdomen, there is a cystic appearing mass within the anterior spleen which measures 3.0 x 2.7 cm. 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 There is aortic atherosclerosis in the upper abdominal aorta. Visualized upper abdominal structures otherwise appear unremarkable. Musculoskeletal: There is degenerative change in the thoracic spine. There are no blastic or lytic bone lesions. No chest wall lesions evident. Review of the MIP images confirms the above findings. IMPRESSION: 1.  No demonstrable pulmonary embolus. 2. Ascending thoracic aorta has a measured diameter of 4.0 x 4.0 cm. No evident dissection. There is aortic atherosclerosis as well as foci of coronary artery and great vessel calcification. Recommend annual imaging followup by CTA or MRA. This recommendation follows 2010  ACCF/AHA/AATS/ACR/ASA/SCA/SCAI/SIR/STS/SVM Guidelines for the Diagnosis and Management of Patients with Thoracic Aortic Disease. Circulation. 2010; 121: Z610-R604. Aortic aneurysm NOS (ICD10-I71.9). 3. Moderate pleural effusions bilaterally with compressive atelectasis in each lung base. A degree of superimposed pneumonia in the bases cannot be excluded. 4.  Much of the stomach is above the diaphragm. 5.  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 6.  No evident thoracic adenopathy. Aortic Atherosclerosis (ICD10-I70.0). Electronically Signed   By: Lowella Grip III M.D.   On: 06/07/2019 18:11   Mr Abdomen W Wo Contrast  Result Date: 05/27/2019 CLINICAL DATA:  Enlarging splenic lesion on CT. EXAM: MRI ABDOMEN WITHOUT AND WITH CONTRAST TECHNIQUE: Multiplanar multisequence MR imaging of the abdomen was performed both before and after the administration of intravenous contrast. CONTRAST:  40m GADAVIST GADOBUTROL 1 MMOL/ML IV SOLN COMPARISON:  05/04/2019 CT abdomen/pelvis. FINDINGS: Lower chest: No acute abnormality at the lung bases. Hepatobiliary: Normal liver size and configuration. No hepatic steatosis. Segment 4A left liver dome simple subcentimeter cyst. No additional liver lesions. Cholecystectomy. Bile ducts are within normal post cholecystectomy limits. Common bile duct diameter 8 mm. No evidence of choledocholithiasis, biliary strictures or beading. Pancreas: There is a 0.7 cm cystic pancreatic body lesion (series 9/image 24) without wall thickening, convincing enhancement or thick septations. No additional pancreatic lesions. No pancreatic duct dilation. No definite pancreas divisum. Spleen: Normal size spleen. There is a 3.1 x 2.8 cm cystic splenic mass (series 9/image 7) with a few thin internal septations, without wall thickening, solid enhancement or thickened septations, compatible with a splenic lymphangioma. No additional splenic lesions. Adrenals/Urinary Tract: Normal adrenals. No  hydronephrosis. Several simple renal cysts scattered throughout both kidneys, largest 1.6 cm in the upper right kidney and 1.3 cm in the upper left kidney. 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 Stomach/Bowel: Large hiatal hernia. Otherwise normal nondistended stomach. Visualized small and large bowel is normal caliber, with no bowel wall thickening. Vascular/Lymphatic: Atherosclerotic nonaneurysmal abdominal aorta. Patent portal, splenic, hepatic and renal veins. No pathologically enlarged lymph nodes in the abdomen. Other: No abdominal ascites or focal fluid collection. Musculoskeletal: No aggressive appearing focal osseous lesions. 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 2. Tiny 0.7 cm cystic  pancreatic body lesion without high risk MRI features, most commonly a side branch IPMN. No biliary or pancreatic duct dilation. Follow-up MRI abdomen without and with IV contrast recommended in 2 years. This recommendation follows ACR consensus guidelines: Management of Incidental Pancreatic Cysts: A White Paper of the ACR Incidental Findings Committee. J Am Coll Radiol 6226;33:354-562. 3. 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 4. Large hiatal hernia. 5.  Aortic Atherosclerosis (ICD10-I70.0). Electronically Signed   By: Ilona Sorrel M.D.   On: 05/27/2019 13:24   Ct Abdomen Pelvis W Contrast  Result Date: 05/31/2019 CLINICAL DATA:  Abdominal pain for 4 hours EXAM: CT ABDOMEN AND PELVIS WITH CONTRAST TECHNIQUE: Multidetector CT imaging of the abdomen and pelvis was performed using the standard protocol following bolus administration of intravenous contrast. CONTRAST:  51m OMNIPAQUE IOHEXOL 300 MG/ML  SOLN COMPARISON:  MRI May 27, 2019, CT May 04, 2019 FINDINGS: Lower chest: There is a large paraesophageal hernia present. There is mild cardiomegaly. The visualized portions of the lungs are clear.  Hepatobiliary: The liver is normal in density without focal abnormality.The main portal vein is patent. The patient is status post cholecystectomy. No biliary ductal dilation. Again noted is mild dilatation of the common bile duct. Pancreas: Unremarkable. No pancreatic ductal dilatation or surrounding inflammatory changes. Spleen: Normal in size. There is an unchanged 3 cm low-density lesion seen within the splenic dome. Adrenals/Urinary Tract: Both adrenal glands appear normal. Low-density lesions seen within both kidneys, shown to be renal cysts on recent MRI. There is also a tiny fat containing lesion in the upper pole the left kidney, consistent with angiomyolipoma Stomach/Bowel: The small bowel is unremarkable. There is a focal segment of colon within the splenic flexure and descending colon that appears to have diffuse bowel wall thickening with surrounding mesenteric fat stranding changesscattered colonic diverticula are noted within the descending colon. There is a moderate amount of colonic stool. No pericolonic free fluid or Vascular/Lymphatic: There are no enlarged mesenteric, retroperitoneal, or pelvic lymph nodes. Scattered aortic atherosclerotic calcifications are seen without aneurysmal dilatation. Reproductive: Again noted within the left adnexa a 4 cm low-density lesion which appears to be present and stable since April 2020. The lesion was present dating back to 2017, however minimally enlarged since that exam. Other: A small fat containing anterior umbilical hernia seen. Musculoskeletal: No acute or significant osseous findings. Chronic superior compression deformity of the L4 vertebral bodies seen with 50% loss in vertebral body height. IMPRESSION: 1. 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 No pericolonic abscess or free air. 2. 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 3. Large paraesophageal hernia. 4. Stable 4 cm left adnexal  cyst. 5.  Aortic Atherosclerosis (ICD10-I70.0). Electronically Signed   By: BPrudencio PairM.D.   On: 05/31/2019 02:08     CBC Recent Labs  Lab 06/02/19 0451 06/03/19 0458 06/04/19 0648 06/07/19 1530  WBC 11.9* 9.1 7.0 7.8  HGB 9.0* 9.5* 9.6* 9.3*  HCT 29.9* 32.1* 31.3* 29.6*  PLT 153 145* 155 196  MCV 101.4* 102.2* 100.0 98.7  MCH 30.5 30.3 30.7 31.0  MCHC 30.1 29.6* 30.7 31.4  RDW 14.8 14.7 14.8 15.6*  LYMPHSABS  --   --   --  0.8  MONOABS  --   --   --  0.9  EOSABS  --   --   --  0.1  BASOSABS  --   --   --  0.0    Chemistries  Recent Labs  Lab 06/02/19 0451 06/03/19 0458 06/03/19 1716 06/04/19 0648 06/05/19 0957 06/07/19 1530 06/07/19 1718 06/08/19 0658  NA 144  --   --  142  --  140  --  142  K 3.5  --   --  3.7  --  3.2*  --  3.4*  CL 116*  --   --  114*  --  113*  --  115*  CO2 20*  --   --  19*  --  18*  --  19*  GLUCOSE 112*  --   --  113*  --  93  --  110*  BUN 22  --   --  9  --  15  --  15  CREATININE 0.99  --   --  0.83  --  1.27*  --  1.20*  CALCIUM 7.8*  --   --  8.3*  --  8.3*  --  8.2*  MG  --   --   --   --   --   --  1.4*  --   AST 137* 61* 43* 33 25 22  --   --   ALT 367* 251* 207* 178* 130* 71*  --   --   ALKPHOS 87 97 78 80 78 72  --   --   BILITOT 0.4 0.4 0.2* 0.5 0.7 0.7  --   --    ------------------------------------------------------------------------------------------------------------------ No results for input(s): CHOL, HDL, LDLCALC, TRIG, CHOLHDL, LDLDIRECT in the last 72 hours.  Lab Results  Component Value Date   HGBA1C 5.2 05/09/2016   ------------------------------------------------------------------------------------------------------------------ No results for input(s): TSH, T4TOTAL, T3FREE, THYROIDAB in the last 72 hours.  Invalid input(s): FREET3 ------------------------------------------------------------------------------------------------------------------ No results for input(s): VITAMINB12, FOLATE, FERRITIN, TIBC,  IRON, RETICCTPCT in the last 72 hours.  Coagulation profile No results for input(s): INR, PROTIME in the last 168 hours.  No results for input(s): DDIMER in the last 72 hours.  Cardiac Enzymes No results for input(s): CKMB, TROPONINI, MYOGLOBIN in the last 168 hours.  Invalid input(s): CK ------------------------------------------------------------------------------------------------------------------    Component Value Date/Time   BNP 84.0 06/07/2019 1530     Tamara Stein M.D on 06/08/2019 at 5:26 PM  Go to [email or web address] - for contact info  [facility] - Office  [phone]

## 2019-06-08 NOTE — [place] Notes (Signed)
Pt denies c/o SOB at present. Breath sounds clear/diminished in bases.

## 2019-06-08 NOTE — Plan of Care (Signed)

## 2019-06-08 NOTE — [place] Notes (Signed)
*  PRELIMINARY RESULTS* Echocardiogram 2D Echocardiogram has been performed.  [name] 06/08/2019, 3:41 PM

## 2019-06-08 NOTE — Care Management Important Message (Signed)
Important Message  Patient Details  Name: Tamara Stein MRN: [id number] Date of Birth: [birth year]   Medicare Important Message Given:  Yes(RN will deliver due to contact precautions)     [name] 06/08/2019, 4:30 PM

## 2019-06-09 ENCOUNTER — Encounter (HOSPITAL_COMMUNITY): Payer: Self-pay | Admitting: Internal Medicine

## 2019-06-09 DIAGNOSIS — R0602 Shortness of breath: Secondary | ICD-10-CM | POA: Diagnosis not present

## 2019-06-09 LAB — BASIC METABOLIC PANEL
Anion gap: 11 (ref 5–15)
BUN: 16 mg/dL (ref 8–23)
CO2: 18 mmol/L — ABNORMAL LOW (ref 22–32)
Calcium: 8.5 mg/dL — ABNORMAL LOW (ref 8.9–10.3)
Chloride: 113 mmol/L — ABNORMAL HIGH (ref 98–111)
Creatinine, Ser: 1.18 mg/dL — ABNORMAL HIGH (ref 0.44–1.00)
GFR calc Af Amer: 51 mL/min — ABNORMAL LOW (ref >60)
GFR calc non Af Amer: 44 mL/min — ABNORMAL LOW (ref >60)
Glucose, Bld: 105 mg/dL — ABNORMAL HIGH (ref 70–99)
Potassium: 3.5 mmol/L (ref 3.5–5.1)
Sodium: 142 mmol/L (ref 135–145)

## 2019-06-09 LAB — CBC
HCT: 28.3 % — ABNORMAL LOW (ref 36.0–46.0)
Hemoglobin: 8.5 g/dL — ABNORMAL LOW (ref 12.0–15.0)
MCH: 30 pg (ref 26.0–34.0)
MCHC: 30 g/dL (ref 30.0–36.0)
MCV: 100 fL (ref 80.0–100.0)
Platelets: 220 10*3/uL (ref 150–400)
RBC: 2.83 MIL/uL — ABNORMAL LOW (ref 3.87–5.11)
RDW: 16 % — ABNORMAL HIGH (ref 11.5–15.5)
WBC: 6.8 10*3/uL (ref 4.0–10.5)
nRBC: 0 % (ref 0.0–0.2)

## 2019-06-09 MED ORDER — CEFDINIR 300 MG PO CAPS: 300.0000 mg | ORAL_CAPSULE | Freq: Two times a day (BID) | ORAL | 0 refills | Status: AC

## 2019-06-09 MED ORDER — GUAIFENESIN ER 600 MG PO TB12: 600.0000 mg | ORAL_TABLET | Freq: Two times a day (BID) | ORAL | 0 refills | Status: AC

## 2019-06-09 MED ORDER — ONDANSETRON HCL 4 MG PO TABS: 4.0000 mg | ORAL_TABLET | Freq: Four times a day (QID) | ORAL | 0 refills | Status: DC | PRN

## 2019-06-09 MED ORDER — FUROSEMIDE 20 MG PO TABS: 20.0000 mg | ORAL_TABLET | Freq: Every day | ORAL | 1 refills | Status: DC

## 2019-06-09 MED ORDER — ALPRAZOLAM 0.5 MG PO TABS: 0.5000 mg | ORAL_TABLET | Freq: Three times a day (TID) | ORAL | 0 refills | Status: DC | PRN

## 2019-06-09 MED ORDER — METRONIDAZOLE 500 MG PO TABS: 500.0000 mg | ORAL_TABLET | Freq: Three times a day (TID) | ORAL | 0 refills | Status: AC

## 2019-06-09 NOTE — [place] Notes (Signed)
IV removed, patient tolerated well. Reviewed AVS with patient and patient's caregiver  [name], both verbalized understanding.  Patient transported home via [place], Jill Poling made aware of patient's discharge home.

## 2019-06-09 NOTE — Clinical Social Work Note (Addendum)
Spoke with family member, [name]. Advised that hospital bed will be delivered today. Provided [place] options from [facility] website. Referral made to [facility] for RN, aide and OT to [facility].  LCSW signing off.   Remon Quinto, Clydene Pugh, LCSW

## 2019-06-09 NOTE — Discharge Instructions (Signed)
1) take medications as prescribed 2) follow-up with PCP within a week for repeat BMP and CBC blood test

## 2019-06-09 NOTE — Discharge Summary (Signed)
Tamara Stein, is a 78 y.o. female  DOB 1940-09-09  MRN [id number].  Admission date:  06/07/2019  Admitting Physician  Bethena Roys, MD  Discharge Date:  06/09/2019   Primary MD  Janora Norlander, DO  Recommendations for primary care physician for things to follow:   Repeat CBC and BMP as advised within a week   Admission Diagnosis  Shortness of breath [R06.02] Healthcare-associated pneumonia [J18.9] Pleural effusion, bilateral [J90]   Discharge Diagnosis  Shortness of breath [R06.02] Healthcare-associated pneumonia [J18.9] Pleural effusion, bilateral [J90]    Active Problems:   Dyspnea   Pleural effusion      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  HPI: Tamara Stein is a 78 y.o. female with medical history significant for cerebellar degeneration, hypothyroidism, hypertension, depression.  Patient presented to the ED with complaints of difficulty breathing started after she was discharged from the hospital and gradually progressed.  She denies cough, no chest pain..  She reports some lower extremity swelling also.  She reports improvement in abdominal symptoms since discharge.  Recent  hospitalization-10/19-10/25 for colitis, with elevated LFTs, acute on chronic kidney injury.  Patient had flexible sigmoidoscopy 10/25 which was without significant acute findings, suspicion of ischemic colitis was not confirmed.  Patient was started on IV ceftriaxone and Flagyl and discharged home on Omnicef and Flagyl, for presumed infectious colitis.  Liver enzymes also trended down prior to discharge.  ED Course: O2 sats greater than 93% on room air.  WBC 7.8.  Creatinine 1.27.  BNP within normal limits at 84. Hs troponin x2 unremarkable.  2 view chest x-ray without acute abnormality.  Subsequent CTA chest-negative for PE, but shows moderate pleural effusions bilaterally with compressive atelectasis in each lung base, degree of superimposed pneumonia in the bases cannot be excluded.  Patient received IV vancomycin and cefepime for possible HCAP.    Hospital Course:    -  Brief Summary 78 y.o.femalewith medical history significant forcerebellar degeneration, hypothyroidism,hypertension, depression admitted with significant dyspnea on 10/27/202020  A/p 1)Possible HCAP with  bilateral pleural effusions with dyspnea and respiratory concerns--- much improved overall----   -MRSA PCR negative stop vancomycin No Hypoxia ---Oxygen sats at rest is  98 % on RA today -Treated IV cefepime -Continue mucolytics  -dc home on Omnicef  -Use Lasix as ordered -WBC is 6.8  2)Elevated LFTs--- review of records shows intermittent LFTs elevation in the past--Had imaging work-up previously, -Discussed with Dr. Laural Golden from GI service, -ALT is down to20fom 530 and AST is down to23fm 313 -Patient is status post prior cholecystectomy -Acute viral hepatitis profile is negative -She takes Lipitor  3) CKD stage - III, creatinine currently 1.1 which is close to baseline, renally adjust medications, avoid nephrotoxic agents / dehydration /hypotension Repeat BMP within a week as we are restarting Lasix  at 20 mg daily  4)resolving left-sided colitis-treated with IV cefepime, and Flagyl -Okay to discharge on Omnicef and Flagyl to complete course  5)Depression and anxiety-stablecontinue Cymbalta 60 mg daily -May use Xanax as needed for anxiety  6)HTN-continue amlodipine 10 mg daily,hydralazine/isosorbide combo,   7)Acute on chronic chronic anemia-Hemoccult blood negative, baseline hemoglobin recently around 11, hemoglobin 8.5 which is not far off from his recent levels   -no evidence of ongoing bleeding at this time elevated MCV noted -Recent flexible sigmoidoscopy without evidence of bleeding  8)Hypothyroidism-continue levothyroxine 50 mcg daily  9)GERD/Hiatal Hernia- continue PPI  10)Cerebellar degeneration--patient has significant challenges with mobility related activities of daily living, she is wheelchair-bound,she gets from bed to wheelchair by sliding --Recent PT eval appreciated,no further PT recommended as patient is at baseline -Patient will need hospital bed/DMEto help with transfers and positional change  Disposition --discharge home Code Status : DNR  Family Communication:   (patient is alert, awake and coherent) -Discussed with Ms. [name] who is at bedside with patient and who is patient's personal caregiver   Discharge Condition: stable  Follow UP  Diet and Activity recommendation:  As advised  Discharge Instructions    Discharge Instructions    Call MD for:  difficulty breathing, headache or visual disturbances   Complete by: As directed    Call MD for:  persistant dizziness or light-headedness   Complete by: As directed    Call MD for:  persistant nausea and vomiting   Complete by: As directed    Call MD for:  temperature >100.4   Complete by: As directed    Diet - low sodium heart healthy   Complete by: As directed    Discharge instructions   Complete by: As directed    1) take medications as prescribed 2) follow-up with PCP  within a week for repeat BMP and CBC blood test   Increase activity slowly   Complete by: As directed         Discharge Medications     Allergies as of 06/09/2019      Reactions   Sulfa Antibiotics Anaphylaxis   Facial swelling    Codeine Other (See Comments)   BLACK OUTS   Doxycycline Nausea Only   Erythromycin Swelling   Ibuprofen Other (See Comments)   Stomach pain, muscle spasm, GI bleeding   Levaquin [levofloxacin] Other (See Comments)   Insomnia/trouble breathing   Penicillins Diarrhea   Has patient had a PCN reaction causing immediate rash, facial/tongue/throat swelling, SOB or lightheadedness with hypotension: No Has patient had a PCN reaction causing severe rash involving mucus membranes or skin necrosis: No Has patient had a PCN reaction that required hospitalization Unknown Has patient had a PCN reaction occurring within the last 10 years: No If all of the above answers are "NO", then may proceed with Cephalosporin use.  Medication List    TAKE these medications   acetaminophen 325 MG tablet Commonly known as: TYLENOL Take 2 tablets (650 mg total) by mouth every 6 (six) hours as needed for mild pain, fever or headache (or Fever >/= 101).   Align Prebiotic-Probiotic 5-1.25 MG-GM Chew Chew 1 tablet by mouth daily.   ALPRAZolam 0.5 MG tablet Commonly known as: Xanax Take 1 tablet (0.5 mg total) by mouth 3 (three) times daily as needed for sleep or anxiety.   amLODipine 10 MG tablet Commonly known as: NORVASC Take 1 tablet (10 mg total) by mouth daily. for blood pressure   aspirin EC 81 MG tablet Take 1 tablet (81 mg total) by mouth daily with breakfast.   atorvastatin 40 MG tablet Commonly known as: LIPITOR Take 1 tablet (40 mg total) by mouth at bedtime. Do not restart until instructed by MD   CALTRATE 600+D PO Take 1 tablet by mouth daily.   cefdinir 300 MG capsule Commonly known as: OMNICEF Take 1 capsule (300 mg total) by mouth 2 (two) times  daily for 2 days.   cetirizine 10 MG chewable tablet Commonly known as: ZYRTEC Chew 10 mg by mouth daily.   DULoxetine 60 MG capsule Commonly known as: CYMBALTA Take 1 capsule (60 mg total) by mouth daily. What changed: when to take this   furosemide 20 MG tablet Commonly known as: LASIX Take 1 tablet (20 mg total) by mouth daily as needed. As needed for swelling   guaiFENesin 600 MG 12 hr tablet Commonly known as: MUCINEX Take 1 tablet (600 mg total) by mouth 2 (two) times daily for 10 days.   hydrALAZINE 50 MG tablet Commonly known as: APRESOLINE Take 1 tablet (50 mg total) by mouth 3 (three) times daily. For BP   isosorbide mononitrate 30 MG 24 hr tablet Commonly known as: IMDUR Take 1 tablet (30 mg total) by mouth daily.   levothyroxine 50 MCG tablet Commonly known as: SYNTHROID TAKE 1 TABLET ONCE DAILY What changed:   how much to take  how to take this  when to take this  additional instructions   meclizine 25 MG tablet Commonly known as: ANTIVERT Take 25 mg by mouth 3 (three) times daily as needed for dizziness.   metroNIDAZOLE 500 MG tablet Commonly known as: Flagyl Take 1 tablet (500 mg total) by mouth 3 (three) times daily for 2 days.   multivitamin with minerals Tabs tablet Take 1 tablet by mouth daily.   omeprazole 40 MG capsule Commonly known as: PRILOSEC TAKE 1 CAPSULE DAILY What changed: when to take this   ondansetron 4 MG tablet Commonly known as: ZOFRAN Take 1 tablet (4 mg total) by mouth every 6 (six) hours as needed for nausea.   raloxifene 60 MG tablet Commonly known as: EVISTA Take 1 tablet (60 mg total) by mouth daily. What changed: when to take this      Major procedures and Radiology Reports - PLEASE review detailed and final reports for all details, in brief -   Dg Chest 2 View  Result Date: 06/07/2019 CLINICAL DATA:  Shortness of breath for 3 days EXAM: CHEST - 2 VIEW COMPARISON:  May 04, 2019 FINDINGS: The heart  size is enlarged. The mediastinal contour is stable. Mild central pulmonary vascular congestion is identified unchanged. There is no focal pneumonia. Small bilateral posterior pleural effusions are identified. The bony structures are stable. IMPRESSION: Mild central pulmonary vascular congestion stable compared prior exam. No focal pneumonia. Small bilateral pleural  effusions. Electronically Signed   By: Abelardo Diesel M.D.   On: 06/07/2019 15:08   Ct Angio Chest Pe W/cm &/or Wo Cm  Result Date: 06/07/2019 CLINICAL DATA:  Shortness of breath EXAM: CT ANGIOGRAPHY CHEST WITH CONTRAST TECHNIQUE: Multidetector CT imaging of the chest was performed using the standard protocol during bolus administration of intravenous contrast. Multiplanar CT image reconstructions and MIPs were obtained to evaluate the vascular anatomy. CONTRAST:  20m OMNIPAQUE IOHEXOL 350 MG/ML SOLN COMPARISON:  Chest radiograph June 07, 2019. Abdominal MRI May 27, 2019 FINDINGS: Cardiovascular: There is no demonstrable pulmonary embolus. Ascending thoracic aorta measures 4.0 x 4.0 cm in diameter. No dissection evident. There are scattered foci of calcification in the visualized great vessels. Note that the right innominate and left common carotid arteries arise as a common trunk, an anatomic variant. There is aortic atherosclerosis as well as foci of coronary artery calcification. There is no pericardial effusion or pericardial thickening. Mediastinum/Nodes: There is a subcentimeter nodular area in the thyroid. Per consensus guidelines, a nodule of this size does not warrant additional imaging surveillance. There is no appreciable thoracic adenopathy. 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 Lungs/Pleura: There are moderate pleural effusions bilaterally. There is consolidation in each lung base, in part due to compressive atelectasis. No consolidation noted elsewhere. There is mild  atelectatic change of the apex in the left upper lobe. Upper Abdomen: In the visualized upper abdomen, there is a cystic appearing mass within the anterior spleen which measures 3.0 x 2.7 cm. 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 There is aortic atherosclerosis in the upper abdominal aorta. Visualized upper abdominal structures otherwise appear unremarkable. Musculoskeletal: There is degenerative change in the thoracic spine. There are no blastic or lytic bone lesions. No chest wall lesions evident. Review of the MIP images confirms the above findings. IMPRESSION: 1.  No demonstrable pulmonary embolus. 2. Ascending thoracic aorta has a measured diameter of 4.0 x 4.0 cm. No evident dissection. There is aortic atherosclerosis as well as foci of coronary artery and great vessel calcification. Recommend annual imaging followup by CTA or MRA. This recommendation follows 2010 ACCF/AHA/AATS/ACR/ASA/SCA/SCAI/SIR/STS/SVM Guidelines for the Diagnosis and Management of Patients with Thoracic Aortic Disease. Circulation. 2010; 121:: V400-Q676 Aortic aneurysm NOS (ICD10-I71.9). 3. Moderate pleural effusions bilaterally with compressive atelectasis in each lung base. A degree of superimposed pneumonia in the bases cannot be excluded. 4.  Much of the stomach is above the diaphragm. 5.  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 6.  No evident thoracic adenopathy. Aortic Atherosclerosis (ICD10-I70.0). Electronically Signed   By: WLowella GripIII M.D.   On: 06/07/2019 18:11   Mr Abdomen W Wo Contrast  Result Date: 05/27/2019 CLINICAL DATA:  Enlarging splenic lesion on CT. EXAM: MRI ABDOMEN WITHOUT AND WITH CONTRAST TECHNIQUE: Multiplanar multisequence MR imaging of the abdomen was performed both before and after the administration of intravenous contrast. CONTRAST:  935mGADAVIST GADOBUTROL 1 MMOL/ML IV SOLN COMPARISON:  05/04/2019 CT abdomen/pelvis. FINDINGS:  Lower chest: No acute abnormality at the lung bases. Hepatobiliary: Normal liver size and configuration. No hepatic steatosis. Segment 4A left liver dome simple subcentimeter cyst. No additional liver lesions. Cholecystectomy. Bile ducts are within normal post cholecystectomy limits. Common bile duct diameter 8 mm. No evidence of choledocholithiasis, biliary strictures or beading. Pancreas: There is a 0.7 cm cystic pancreatic body lesion (series 9/image 24) without wall thickening, convincing enhancement  or thick septations. No additional pancreatic lesions. No pancreatic duct dilation. No definite pancreas divisum. Spleen: Normal size spleen. There is a 3.1 x 2.8 cm cystic splenic mass (series 9/image 7) with a few thin internal septations, without wall thickening, solid enhancement or thickened septations, compatible with a splenic lymphangioma. No additional splenic lesions. Adrenals/Urinary Tract: Normal adrenals. No hydronephrosis. Several simple renal cysts scattered throughout both kidneys, largest 1.6 cm in the upper right kidney and 1.3 cm in the upper left kidney. 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 Stomach/Bowel: Large hiatal hernia. Otherwise normal nondistended stomach. Visualized small and large bowel is normal caliber, with no bowel wall thickening. Vascular/Lymphatic: Atherosclerotic nonaneurysmal abdominal aorta. Patent portal, splenic, hepatic and renal veins. No pathologically enlarged lymph nodes in the abdomen. Other: No abdominal ascites or focal fluid collection. Musculoskeletal: No aggressive appearing focal osseous lesions. 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 2. Tiny 0.7 cm cystic pancreatic body lesion without high risk MRI features, most commonly a side branch IPMN. No biliary or pancreatic duct dilation. Follow-up MRI abdomen without and with IV contrast recommended in 2 years. This recommendation  follows ACR consensus guidelines: Management of Incidental Pancreatic Cysts: A White Paper of the ACR Incidental Findings Committee. J Am Coll Radiol 5400;86:761-950. 3. 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 4. Large hiatal hernia. 5.  Aortic Atherosclerosis (ICD10-I70.0). Electronically Signed   By: Ilona Sorrel M.D.   On: 05/27/2019 13:24   Ct Abdomen Pelvis W Contrast  Result Date: 05/31/2019 CLINICAL DATA:  Abdominal pain for 4 hours EXAM: CT ABDOMEN AND PELVIS WITH CONTRAST TECHNIQUE: Multidetector CT imaging of the abdomen and pelvis was performed using the standard protocol following bolus administration of intravenous contrast. CONTRAST:  52m OMNIPAQUE IOHEXOL 300 MG/ML  SOLN COMPARISON:  MRI May 27, 2019, CT May 04, 2019 FINDINGS: Lower chest: There is a large paraesophageal hernia present. There is mild cardiomegaly. The visualized portions of the lungs are clear. Hepatobiliary: The liver is normal in density without focal abnormality.The main portal vein is patent. The patient is status post cholecystectomy. No biliary ductal dilation. Again noted is mild dilatation of the common bile duct. Pancreas: Unremarkable. No pancreatic ductal dilatation or surrounding inflammatory changes. Spleen: Normal in size. There is an unchanged 3 cm low-density lesion seen within the splenic dome. Adrenals/Urinary Tract: Both adrenal glands appear normal. Low-density lesions seen within both kidneys, shown to be renal cysts on recent MRI. There is also a tiny fat containing lesion in the upper pole the left kidney, consistent with angiomyolipoma Stomach/Bowel: The small bowel is unremarkable. There is a focal segment of colon within the splenic flexure and descending colon that appears to have diffuse bowel wall thickening with surrounding mesenteric fat stranding changesscattered colonic diverticula are noted within the descending colon. There is a moderate amount of colonic stool.  No pericolonic free fluid or Vascular/Lymphatic: There are no enlarged mesenteric, retroperitoneal, or pelvic lymph nodes. Scattered aortic atherosclerotic calcifications are seen without aneurysmal dilatation. Reproductive: Again noted within the left adnexa a 4 cm low-density lesion which appears to be present and stable since April 2020. The lesion was present dating back to 2017, however minimally enlarged since that exam. Other: A small fat containing anterior umbilical hernia seen. Musculoskeletal: No acute or significant osseous findings. Chronic superior compression deformity of the L4 vertebral bodies seen with 50% loss in vertebral body height. IMPRESSION: 1. Findings suggestive  of splenic flexure and proximal descending colitis. No pericolonic abscess or free air. 2. 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 3. Large paraesophageal hernia. 4. Stable 4 cm left adnexal cyst. 5.  Aortic Atherosclerosis (ICD10-I70.0). Electronically Signed   By: Prudencio Pair M.D.   On: 05/31/2019 02:08   Micro Results   Recent Results (from the past 240 hour(s))  Culture, Urine     Status: Abnormal   Collection Time: 05/31/19  2:16 AM   Specimen: Urine, Clean Catch  Result Value Ref Range Status   Specimen Description   Final    URINE, CLEAN CATCH Performed at [facility], [address] [place], [place] [address]    Special Requests   Final    NONE Performed at [facility], [address] [place],  [address]    Culture >=100,000 COLONIES/mL ESCHERICHIA COLI (A)  Final   Report Status 06/02/2019 FINAL  Final   Organism ID, Bacteria ESCHERICHIA COLI (A)  Final      Susceptibility   Escherichia coli - MIC*    AMPICILLIN 8 SENSITIVE Sensitive     CEFAZOLIN <=4 SENSITIVE Sensitive     CEFTRIAXONE <=1 SENSITIVE Sensitive     CIPROFLOXACIN <=0.25 SENSITIVE Sensitive     GENTAMICIN <=1 SENSITIVE Sensitive     IMIPENEM <=0.25 SENSITIVE Sensitive     NITROFURANTOIN  <=16 SENSITIVE Sensitive     TRIMETH/SULFA <=20 SENSITIVE Sensitive     AMPICILLIN/SULBACTAM 4 SENSITIVE Sensitive     PIP/TAZO <=4 SENSITIVE Sensitive     Extended ESBL NEGATIVE Sensitive     * >=100,000 COLONIES/mL ESCHERICHIA COLI  SARS CORONAVIRUS 2 (TAT 6-24 HRS) Nasopharyngeal Nasopharyngeal Swab     Status: None   Collection Time: 05/31/19  3:19 AM   Specimen: Nasopharyngeal Swab  Result Value Ref Range Status   SARS Coronavirus 2 NEGATIVE NEGATIVE Final    Comment: (NOTE) SARS-CoV-2 target nucleic acids are NOT DETECTED. The SARS-CoV-2 RNA is generally detectable in upper and lower respiratory specimens during the acute phase of infection. Negative results do not preclude SARS-CoV-2 infection, do not rule out co-infections with other pathogens, and should not be used as the sole basis for treatment or other patient management decisions. Negative results must be combined with clinical observations, patient history, and epidemiological information. The expected result is Negative. Fact Sheet for Patients: SugarRoll.be Fact Sheet for Healthcare Providers: https://www.woods-mathews.com/ This test is not yet approved or cleared by the [place] FDA and  has been authorized for detection and/or diagnosis of SARS-CoV-2 by FDA under an Emergency Use Authorization (EUA). This EUA will remain  in effect (meaning this test can be used) for the duration of the COVID-19 declaration under Section 56 4(b)(1) of the Act, 21 U.S.C. section 360bbb-3(b)(1), unless the authorization is terminated or revoked sooner. Performed at [facility], [place] [address], [place], [place] [address]   SARS CORONAVIRUS 2 (TAT 6-24 HRS) Nasopharyngeal Nasopharyngeal Swab     Status: None   Collection Time: 06/07/19  6:30 PM   Specimen: Nasopharyngeal Swab  Result Value Ref Range Status   SARS Coronavirus 2 NEGATIVE NEGATIVE Final    Comment:  (NOTE) SARS-CoV-2 target nucleic acids are NOT DETECTED. The SARS-CoV-2 RNA is generally detectable in upper and lower respiratory specimens during the acute phase of infection. Negative results do not preclude SARS-CoV-2 infection, do not rule out co-infections with other pathogens, and should not be used as the sole basis for treatment or  other patient management decisions. Negative results must be combined with clinical observations, patient history, and epidemiological information. The expected result is Negative. Fact Sheet for Patients: SugarRoll.be Fact Sheet for Healthcare Providers: https://www.woods-mathews.com/ This test is not yet approved or cleared by the [place] FDA and  has been authorized for detection and/or diagnosis of SARS-CoV-2 by FDA under an Emergency Use Authorization (EUA). This EUA will remain  in effect (meaning this test can be used) for the duration of the COVID-19 declaration under Section 56 4(b)(1) of the Act, 21 U.S.C. section 360bbb-3(b)(1), unless the authorization is terminated or revoked sooner. Performed at [facility], [place] [address], [place], [place] [address]   MRSA PCR Screening     Status: None   Collection Time: 06/08/19  5:03 AM   Specimen: Nasal Mucosa; Nasopharyngeal  Result Value Ref Range Status   MRSA by PCR NEGATIVE NEGATIVE Final    Comment:        The GeneXpert MRSA Assay (FDA approved for NASAL specimens only), is one component of a comprehensive MRSA colonization surveillance program. It is not intended to diagnose MRSA infection nor to guide or monitor treatment for MRSA infections. Performed at [facility], [address] [place], [place] [address]        Today   Subjective    Niyati Heinke today has no new complaints No fever  Or chills   No Nausea, Vomiting or Diarrhea  -No productive cough, no shortness of breath, O2 sats 97 to 98% on room  air          Patient has been seen and examined prior to discharge   Objective   Blood pressure (!) 145/82, pulse (!) 101, temperature 98.2 F (36.8 C), temperature source Oral, resp. rate 20, height 5' 2"  (1.575 m), weight 92.8 kg, SpO2 95 %.   Intake/Output Summary (Last 24 hours) at 06/09/2019 1227 Last data filed at 06/09/2019 0001 Gross per 24 hour  Intake 540.34 ml  Output 250 ml  Net 290.34 ml    Exam Gen:- Awake Alert, no acute distress , speaking in complete sentences HEENT:- [place].AT, No sclera icterus Neck-Supple Neck,No JVD,.  Lungs-improving air movement, no wheezing CV- S1, S2 normal, regular Abd-  +ve B.Sounds, Abd Soft, No tenderness,   Extremity/Skin:- No  edema,   good pulses Psych-affect is appropriate, oriented x3 Neuro-generalized weakness, at baseline patient is wheelchair-bound and  bedbound no new focal deficits, no tremors    Data Review   CBC w Diff:  Lab Results  Component Value Date   WBC 6.8 06/09/2019   HGB 8.5 (L) 06/09/2019   HGB 11.0 (L) 05/18/2019   HCT 28.3 (L) 06/09/2019   HCT 33.3 (L) 05/18/2019   PLT 220 06/09/2019   PLT 276 05/18/2019   LYMPHOPCT 11 06/07/2019   MONOPCT 12 06/07/2019   EOSPCT 2 06/07/2019   BASOPCT 1 06/07/2019   CMP:  Lab Results  Component Value Date   NA 142 06/09/2019   NA 141 05/18/2019   K 3.5 06/09/2019   CL 113 (H) 06/09/2019   CO2 18 (L) 06/09/2019   BUN 16 06/09/2019   BUN 38 (H) 05/18/2019   CREATININE 1.18 (H) 06/09/2019   PROT 6.0 (L) 06/07/2019   PROT 6.6 05/18/2019   ALBUMIN 3.2 (L) 06/07/2019   ALBUMIN 4.2 05/18/2019   BILITOT 0.7 06/07/2019   BILITOT 0.3 05/18/2019   ALKPHOS 72 06/07/2019   AST 22 06/07/2019   ALT 71 (H) 06/07/2019  .  Total Discharge time is about 33 minutes  Roxan Hockey M.D on 06/09/2019 at 12:27 PM  Go to [email or web address] -  for contact info  [facility] - Office  [phone]

## 2019-06-10 ENCOUNTER — Ambulatory Visit: Payer: Medicare Other | Admitting: Family Medicine

## 2019-06-12 DIAGNOSIS — G3189 Other specified degenerative diseases of nervous system: Secondary | ICD-10-CM | POA: Diagnosis not present

## 2019-06-12 DIAGNOSIS — Z7982 Long term (current) use of aspirin: Secondary | ICD-10-CM | POA: Diagnosis not present

## 2019-06-12 DIAGNOSIS — F329 Major depressive disorder, single episode, unspecified: Secondary | ICD-10-CM | POA: Diagnosis not present

## 2019-06-12 DIAGNOSIS — R2689 Other abnormalities of gait and mobility: Secondary | ICD-10-CM | POA: Diagnosis not present

## 2019-06-12 DIAGNOSIS — Z993 Dependence on wheelchair: Secondary | ICD-10-CM | POA: Diagnosis not present

## 2019-06-12 DIAGNOSIS — Z792 Long term (current) use of antibiotics: Secondary | ICD-10-CM | POA: Diagnosis not present

## 2019-06-12 DIAGNOSIS — E039 Hypothyroidism, unspecified: Secondary | ICD-10-CM | POA: Diagnosis not present

## 2019-06-12 DIAGNOSIS — R011 Cardiac murmur, unspecified: Secondary | ICD-10-CM | POA: Diagnosis not present

## 2019-06-12 DIAGNOSIS — H269 Unspecified cataract: Secondary | ICD-10-CM | POA: Diagnosis not present

## 2019-06-12 DIAGNOSIS — E785 Hyperlipidemia, unspecified: Secondary | ICD-10-CM | POA: Diagnosis not present

## 2019-06-12 DIAGNOSIS — J90 Pleural effusion, not elsewhere classified: Secondary | ICD-10-CM | POA: Diagnosis not present

## 2019-06-12 DIAGNOSIS — D631 Anemia in chronic kidney disease: Secondary | ICD-10-CM | POA: Diagnosis not present

## 2019-06-12 DIAGNOSIS — N183 Chronic kidney disease, stage 3 unspecified: Secondary | ICD-10-CM | POA: Diagnosis not present

## 2019-06-12 DIAGNOSIS — K449 Diaphragmatic hernia without obstruction or gangrene: Secondary | ICD-10-CM | POA: Diagnosis not present

## 2019-06-12 DIAGNOSIS — J189 Pneumonia, unspecified organism: Secondary | ICD-10-CM | POA: Diagnosis not present

## 2019-06-12 DIAGNOSIS — K5730 Diverticulosis of large intestine without perforation or abscess without bleeding: Secondary | ICD-10-CM | POA: Diagnosis not present

## 2019-06-12 DIAGNOSIS — D62 Acute posthemorrhagic anemia: Secondary | ICD-10-CM | POA: Diagnosis not present

## 2019-06-12 DIAGNOSIS — Z79899 Other long term (current) drug therapy: Secondary | ICD-10-CM | POA: Diagnosis not present

## 2019-06-12 DIAGNOSIS — K219 Gastro-esophageal reflux disease without esophagitis: Secondary | ICD-10-CM | POA: Diagnosis not present

## 2019-06-12 DIAGNOSIS — I129 Hypertensive chronic kidney disease with stage 1 through stage 4 chronic kidney disease, or unspecified chronic kidney disease: Secondary | ICD-10-CM | POA: Diagnosis not present

## 2019-06-13 ENCOUNTER — Ambulatory Visit (INDEPENDENT_AMBULATORY_CARE_PROVIDER_SITE_OTHER): Payer: Medicare Other | Admitting: Family Medicine

## 2019-06-13 ENCOUNTER — Other Ambulatory Visit: Payer: Self-pay

## 2019-06-13 DIAGNOSIS — Z09 Encounter for follow-up examination after completed treatment for conditions other than malignant neoplasm: Secondary | ICD-10-CM | POA: Diagnosis not present

## 2019-06-13 DIAGNOSIS — R748 Abnormal levels of other serum enzymes: Secondary | ICD-10-CM

## 2019-06-13 DIAGNOSIS — M25511 Pain in right shoulder: Secondary | ICD-10-CM

## 2019-06-13 DIAGNOSIS — R410 Disorientation, unspecified: Secondary | ICD-10-CM | POA: Diagnosis not present

## 2019-06-13 DIAGNOSIS — N183 Chronic kidney disease, stage 3 unspecified: Secondary | ICD-10-CM | POA: Diagnosis not present

## 2019-06-13 DIAGNOSIS — M25512 Pain in left shoulder: Secondary | ICD-10-CM

## 2019-06-13 DIAGNOSIS — R4781 Slurred speech: Secondary | ICD-10-CM

## 2019-06-13 DIAGNOSIS — I712 Thoracic aortic aneurysm, without rupture: Secondary | ICD-10-CM

## 2019-06-13 DIAGNOSIS — I7121 Aneurysm of the ascending aorta, without rupture: Secondary | ICD-10-CM

## 2019-06-13 DIAGNOSIS — G319 Degenerative disease of nervous system, unspecified: Secondary | ICD-10-CM | POA: Diagnosis not present

## 2019-06-13 DIAGNOSIS — J189 Pneumonia, unspecified organism: Secondary | ICD-10-CM

## 2019-06-13 MED ORDER — DICLOFENAC SODIUM 1 % TD GEL: 4.0000 g | Freq: Four times a day (QID) | TRANSDERMAL | 0 refills | Status: DC

## 2019-06-13 NOTE — Progress Notes (Signed)
Telephone visit  Subjective: CC: Hospital discharge follow up PCP: Janora Norlander, DO BOF:[id number] Myer is a 78 y.o. female calls for telephone consult today. Patient provides verbal consent for consult held via phone.  Location of patient: home Location of provider: Working remotely from home Others present for call: [name]  1.  Hospital discharge follow-up for HCAP and bilateral pleural effusion Patient with multiple hospitalizations over the last couple of months.  She was initially hospitalized and treated for colitis with elevated LFTs, acute on chronic kidney injury.  She is status post treatment with IV and oral antibiotics.  On 06/07/2019, she was admitted for shortness of breath and found to have a healthcare acquired pneumonia with bilateral pleural effusion noted on CT chest.  She was treated with IV antibiotics and transitioned over to [facility].  White blood cell count was stable at discharge at 6.8.  Renal function was within her normal baseline with creatinine of 1.18.  She was discharged home with a hospital bed and [facility] RN, PT.  They have not set up the bed yet.  [name] notes that she continues to be very weak.  Her speech is very slurred.  She continues to be in a lot of pain in her shoulders and knees. She has been on a lower dose of lisinopril 24m and BPs are normal.  HH RN started with patient x1 visit yesterday.  Of note CTA also showed an aortic aneurysm that was 4 cm x 4 cm in size.    ROS: Per HPI  Allergies  Allergen Reactions  . Sulfa Antibiotics Anaphylaxis    Facial swelling   . Codeine Other (See Comments)    BLACK OUTS  . Doxycycline Nausea Only  . Erythromycin Swelling  . Ibuprofen Other (See Comments)    Stomach pain, muscle spasm, GI bleeding  . Levaquin [Levofloxacin] Other (See Comments)    Insomnia/trouble breathing  . Penicillins Diarrhea    Has patient had a PCN reaction causing immediate rash, facial/tongue/throat swelling, SOB or  lightheadedness with hypotension: No Has patient had a PCN reaction causing severe rash involving mucus membranes or skin necrosis: No Has patient had a PCN reaction that required hospitalization Unknown Has patient had a PCN reaction occurring within the last 10 years: No If all of the above answers are "NO", then may proceed with Cephalosporin use.    Past Medical History:  Diagnosis Date  . Allergy   . Anxiety   . Cataract   . [place]   . Chronic kidney disease   . Chronic knee pain   . Depression   . GERD (gastroesophageal reflux disease)   . Hyperlipidemia   . Hypertension   . Thyroid disease     Current Outpatient Medications:  .  acetaminophen (TYLENOL) 325 MG tablet, Take 2 tablets (650 mg total) by mouth every 6 (six) hours as needed for mild pain, fever or headache (or Fever >/= 101)., Disp: 120 tablet, Rfl: 2 .  ALPRAZolam (XANAX) 0.5 MG tablet, Take 1 tablet (0.5 mg total) by mouth 3 (three) times daily as needed for sleep or anxiety., Disp: 15 tablet, Rfl: 0 .  amLODipine (NORVASC) 10 MG tablet, Take 1 tablet (10 mg total) by mouth daily. for blood pressure, Disp: 90 tablet, Rfl: 1 .  aspirin EC 81 MG tablet, Take 1 tablet (81 mg total) by mouth daily with breakfast., Disp: 120 tablet, Rfl: 2 .  atorvastatin (LIPITOR) 40 MG tablet, Take 1 tablet (40 mg  total) by mouth at bedtime. Do not restart until instructed by MD, Disp: 90 tablet, Rfl: 0 .  Bacillus Coagulans-Inulin (ALIGN PREBIOTIC-PROBIOTIC) 5-1.25 MG-GM CHEW, Chew 1 tablet by mouth daily., Disp: 30 tablet, Rfl: 0 .  Calcium Carbonate-Vitamin D (CALTRATE 600+D PO), Take 1 tablet by mouth daily., Disp: , Rfl:  .  cetirizine (ZYRTEC) 10 MG chewable tablet, Chew 10 mg by mouth daily., Disp: , Rfl:  .  DULoxetine (CYMBALTA) 60 MG capsule, Take 1 capsule (60 mg total) by mouth daily. (Patient taking differently: Take 60 mg by mouth every morning. ), Disp: 90 capsule, Rfl: 0 .  furosemide (LASIX) 20  MG tablet, Take 1 tablet (20 mg total) by mouth daily. For swelling/Fluid, Disp: 30 tablet, Rfl: 1 .  guaiFENesin (MUCINEX) 600 MG 12 hr tablet, Take 1 tablet (600 mg total) by mouth 2 (two) times daily for 10 days., Disp: 20 tablet, Rfl: 0 .  hydrALAZINE (APRESOLINE) 50 MG tablet, Take 1 tablet (50 mg total) by mouth 3 (three) times daily. For BP, Disp: 90 tablet, Rfl: 3 .  isosorbide mononitrate (IMDUR) 30 MG 24 hr tablet, Take 1 tablet (30 mg total) by mouth daily., Disp: 30 tablet, Rfl: 3 .  levothyroxine (SYNTHROID) 50 MCG tablet, TAKE 1 TABLET ONCE DAILY (Patient taking differently: Take 50 mcg by mouth daily before breakfast. ), Disp: 90 tablet, Rfl: 1 .  meclizine (ANTIVERT) 25 MG tablet, Take 25 mg by mouth 3 (three) times daily as needed for dizziness., Disp: , Rfl:  .  Multiple Vitamin (MULTIVITAMIN WITH MINERALS) TABS tablet, Take 1 tablet by mouth daily., Disp: , Rfl:  .  omeprazole (PRILOSEC) 40 MG capsule, TAKE 1 CAPSULE DAILY (Patient taking differently: Take 40 mg by mouth daily before breakfast. ), Disp: 90 capsule, Rfl: 1 .  ondansetron (ZOFRAN) 4 MG tablet, Take 1 tablet (4 mg total) by mouth every 6 (six) hours as needed for nausea., Disp: 20 tablet, Rfl: 0 .  raloxifene (EVISTA) 60 MG tablet, Take 1 tablet (60 mg total) by mouth daily. (Patient taking differently: Take 60 mg by mouth every morning. ), Disp: 90 tablet, Rfl: 1  Assessment/ Plan: 78 y.o. female   1. Hospital discharge follow-up I reviewed her last 2 hospital discharge summaries.  For some reason the lisinopril 10 mg had fallen off her med list but I do not see that this was formally discontinued by the hospitalist.  I have added this back.  I had a long conversation with [name] about patient's state.  I think that she is experiencing delirium due to uncontrolled pain given discontinuation of the NSAID, though do agree with discontinuation given acute on chronic renal injury and known CKD 3.  I have instead ordered  Voltaren gel as below.  May need to do prior authorization to get this covered.  We discussed ways to manage delirium.  If symptoms were to worsen, low threshold to have her reevaluated to rule out delirium caused by worsening infection.  We also had a frank conversation about possible need for comfort care if she continues to decline.  Very worried that she has had 3 hospitalizations in the last 2 months.  It sounds like she is not thriving at home currently but hesitate to order any comfort care given acute illness.  I will reconvene with the family on Friday to check in and see how patient is progressing.  2. HCAP (healthcare-associated pneumonia) Currently completing Omnicef status post IV antibiotics in the hospital.  Faythe Ghee  to use Mucinex if needed for chest congestion.  May use Robitussin at nighttime if needed for cough but would hold evening dose of Mucinex.  3. Pain of both shoulder joints Not controlled with Tylenol.  Recommending Voltaren gel.  This is been ordered  4. Stage 3 chronic kidney disease, unspecified whether stage 3a or 3b CKD Avoid oral NSAIDs given renal disease  5. 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  Asking for speech therapy as this is helped previously.  She has known cerebellar degeneration and has had slurred speech at baseline.  There we did discuss the possibility of stroke and progression of her underlying cerebral disease.  Family will reach out to neurology as well for recommendations.  6. Slurred speech SLP ordered with home health.  Currently using [facility]  7. Elevated liver enzymes Plan for CBC and CMP.  Currently holding Lipitor while on Tylenol.  GI gave the go ahead to resume Lipitor use during her 06/05/2019 hospitalization.  8. Delirium As above.  May be precipitated from uncontrolled shoulder pain given recent discontinuation of oral NSAID and missed appointments with  orthopedist due to illness versus hospitalization versus acute illness with pneumonia.  Likely multifactorial.  9. Ascending aortic aneurysm ([place]) Noted on CTA on 06/07/2019.  Annual follow-up was recommended of this 4 cm x 4 cm ascending aortic aneurysm without evidence of dissection.  Start time: 10:12am End time: 10:37am  Total time spent on patient care (including telephone call/ virtual visit): 30 minutes  [place], [place] [phone]

## 2019-06-17 DIAGNOSIS — Z5181 Encounter for therapeutic drug level monitoring: Secondary | ICD-10-CM | POA: Diagnosis not present

## 2019-06-17 DIAGNOSIS — M170 Bilateral primary osteoarthritis of knee: Secondary | ICD-10-CM | POA: Diagnosis not present

## 2019-06-20 ENCOUNTER — Telehealth: Payer: Self-pay | Admitting: Family Medicine

## 2019-06-20 ENCOUNTER — Other Ambulatory Visit: Payer: Self-pay | Admitting: Family Medicine

## 2019-06-20 DIAGNOSIS — K219 Gastro-esophageal reflux disease without esophagitis: Secondary | ICD-10-CM

## 2019-06-20 MED ORDER — FAMOTIDINE 10 MG PO TABS: 10.0000 mg | ORAL_TABLET | Freq: Every day | ORAL | 0 refills | Status: DC

## 2019-06-20 NOTE — Telephone Encounter (Signed)
Patient wants to add pecid 20 to what she is taking.

## 2019-06-20 NOTE — Telephone Encounter (Signed)
She is on max dose of Omeprazole.  Could either switch to Omeprazole 29m and have her take twice daily if the 463mwears off to quickly OR could add Pepcid 2066mID to Omeprazole 41m16mCE daily that she is taking.

## 2019-06-20 NOTE — Telephone Encounter (Signed)
I have sent in Pepcid 34m daily (reduced for impaired kidney function).  Please inform patient.

## 2019-06-21 ENCOUNTER — Inpatient Hospital Stay (HOSPITAL_COMMUNITY)
Admission: EM | Admit: 2019-06-21 | Discharge: 2019-06-28 | DRG: 871 | Disposition: A | Discharge status: Home Health | Payer: Medicare Other | Attending: Internal Medicine | Admitting: Internal Medicine

## 2019-06-21 ENCOUNTER — Encounter (HOSPITAL_COMMUNITY): Payer: Self-pay | Admitting: *Deleted

## 2019-06-21 ENCOUNTER — Emergency Department (HOSPITAL_COMMUNITY): Payer: Medicare Other

## 2019-06-21 ENCOUNTER — Other Ambulatory Visit: Payer: Self-pay

## 2019-06-21 DIAGNOSIS — A4150 Gram-negative sepsis, unspecified: Secondary | ICD-10-CM | POA: Diagnosis present

## 2019-06-21 DIAGNOSIS — G319 Degenerative disease of nervous system, unspecified: Secondary | ICD-10-CM | POA: Diagnosis present

## 2019-06-21 DIAGNOSIS — E785 Hyperlipidemia, unspecified: Secondary | ICD-10-CM | POA: Diagnosis not present

## 2019-06-21 DIAGNOSIS — E039 Hypothyroidism, unspecified: Secondary | ICD-10-CM | POA: Diagnosis present

## 2019-06-21 DIAGNOSIS — Z20828 Contact with and (suspected) exposure to other viral communicable diseases: Secondary | ICD-10-CM | POA: Diagnosis present

## 2019-06-21 DIAGNOSIS — I10 Essential (primary) hypertension: Secondary | ICD-10-CM | POA: Diagnosis present

## 2019-06-21 DIAGNOSIS — Z79890 Hormone replacement therapy (postmenopausal): Secondary | ICD-10-CM

## 2019-06-21 DIAGNOSIS — R05 Cough: Secondary | ICD-10-CM | POA: Diagnosis not present

## 2019-06-21 DIAGNOSIS — Z79899 Other long term (current) drug therapy: Secondary | ICD-10-CM

## 2019-06-21 DIAGNOSIS — Z833 Family history of diabetes mellitus: Secondary | ICD-10-CM

## 2019-06-21 DIAGNOSIS — K219 Gastro-esophageal reflux disease without esophagitis: Secondary | ICD-10-CM | POA: Diagnosis present

## 2019-06-21 DIAGNOSIS — R0602 Shortness of breath: Secondary | ICD-10-CM | POA: Diagnosis not present

## 2019-06-21 DIAGNOSIS — Z881 Allergy status to other antibiotic agents status: Secondary | ICD-10-CM

## 2019-06-21 DIAGNOSIS — R58 Hemorrhage, not elsewhere classified: Secondary | ICD-10-CM | POA: Diagnosis not present

## 2019-06-21 DIAGNOSIS — F419 Anxiety disorder, unspecified: Secondary | ICD-10-CM | POA: Diagnosis present

## 2019-06-21 DIAGNOSIS — N1831 Chronic kidney disease, stage 3a: Secondary | ICD-10-CM | POA: Diagnosis present

## 2019-06-21 DIAGNOSIS — N179 Acute kidney failure, unspecified: Secondary | ICD-10-CM

## 2019-06-21 DIAGNOSIS — Z66 Do not resuscitate: Secondary | ICD-10-CM | POA: Diagnosis present

## 2019-06-21 DIAGNOSIS — F32A Depression, unspecified: Secondary | ICD-10-CM | POA: Diagnosis present

## 2019-06-21 DIAGNOSIS — Z888 Allergy status to other drugs, medicaments and biological substances status: Secondary | ICD-10-CM | POA: Diagnosis not present

## 2019-06-21 DIAGNOSIS — D649 Anemia, unspecified: Secondary | ICD-10-CM | POA: Diagnosis present

## 2019-06-21 DIAGNOSIS — N3000 Acute cystitis without hematuria: Secondary | ICD-10-CM | POA: Diagnosis present

## 2019-06-21 DIAGNOSIS — I130 Hypertensive heart and chronic kidney disease with heart failure and stage 1 through stage 4 chronic kidney disease, or unspecified chronic kidney disease: Secondary | ICD-10-CM | POA: Diagnosis present

## 2019-06-21 DIAGNOSIS — Z90710 Acquired absence of both cervix and uterus: Secondary | ICD-10-CM

## 2019-06-21 DIAGNOSIS — Z8349 Family history of other endocrine, nutritional and metabolic diseases: Secondary | ICD-10-CM

## 2019-06-21 DIAGNOSIS — Z9049 Acquired absence of other specified parts of digestive tract: Secondary | ICD-10-CM

## 2019-06-21 DIAGNOSIS — I4891 Unspecified atrial fibrillation: Secondary | ICD-10-CM

## 2019-06-21 DIAGNOSIS — I5033 Acute on chronic diastolic (congestive) heart failure: Secondary | ICD-10-CM | POA: Diagnosis present

## 2019-06-21 DIAGNOSIS — E876 Hypokalemia: Secondary | ICD-10-CM | POA: Diagnosis present

## 2019-06-21 DIAGNOSIS — N170 Acute kidney failure with tubular necrosis: Secondary | ICD-10-CM | POA: Diagnosis present

## 2019-06-21 DIAGNOSIS — Z7982 Long term (current) use of aspirin: Secondary | ICD-10-CM

## 2019-06-21 DIAGNOSIS — Z880 Allergy status to penicillin: Secondary | ICD-10-CM

## 2019-06-21 DIAGNOSIS — R6520 Severe sepsis without septic shock: Secondary | ICD-10-CM | POA: Diagnosis not present

## 2019-06-21 DIAGNOSIS — Z7401 Bed confinement status: Secondary | ICD-10-CM | POA: Diagnosis not present

## 2019-06-21 DIAGNOSIS — Z1619 Resistance to other specified beta lactam antibiotics: Secondary | ICD-10-CM | POA: Diagnosis present

## 2019-06-21 DIAGNOSIS — A419 Sepsis, unspecified organism: Secondary | ICD-10-CM

## 2019-06-21 DIAGNOSIS — Z885 Allergy status to narcotic agent status: Secondary | ICD-10-CM

## 2019-06-21 DIAGNOSIS — F329 Major depressive disorder, single episode, unspecified: Secondary | ICD-10-CM | POA: Diagnosis present

## 2019-06-21 DIAGNOSIS — Z809 Family history of malignant neoplasm, unspecified: Secondary | ICD-10-CM

## 2019-06-21 DIAGNOSIS — N183 Chronic kidney disease, stage 3 unspecified: Secondary | ICD-10-CM | POA: Diagnosis present

## 2019-06-21 DIAGNOSIS — N1832 Chronic kidney disease, stage 3b: Secondary | ICD-10-CM | POA: Diagnosis not present

## 2019-06-21 LAB — COMPREHENSIVE METABOLIC PANEL
ALT: 22 U/L (ref 0–44)
AST: 28 U/L (ref 15–41)
Albumin: 3.1 g/dL — ABNORMAL LOW (ref 3.5–5.0)
Alkaline Phosphatase: 68 U/L (ref 38–126)
Anion gap: 13 (ref 5–15)
BUN: 28 mg/dL — ABNORMAL HIGH (ref 8–23)
CO2: 22 mmol/L (ref 22–32)
Calcium: 8.3 mg/dL — ABNORMAL LOW (ref 8.9–10.3)
Chloride: 103 mmol/L (ref 98–111)
Creatinine, Ser: 1.56 mg/dL — ABNORMAL HIGH (ref 0.44–1.00)
GFR calc Af Amer: 36 mL/min — ABNORMAL LOW (ref >60)
GFR calc non Af Amer: 31 mL/min — ABNORMAL LOW (ref >60)
Glucose, Bld: 140 mg/dL — ABNORMAL HIGH (ref 70–99)
Potassium: 3.1 mmol/L — ABNORMAL LOW (ref 3.5–5.1)
Sodium: 138 mmol/L (ref 135–145)
Total Bilirubin: 0.9 mg/dL (ref 0.3–1.2)
Total Protein: 6.2 g/dL — ABNORMAL LOW (ref 6.5–8.1)

## 2019-06-21 LAB — LACTATE DEHYDROGENASE: LDH: 164 U/L (ref 98–192)

## 2019-06-21 LAB — CBC WITH DIFFERENTIAL/PLATELET
Abs Immature Granulocytes: 0.09 10*3/uL — ABNORMAL HIGH (ref 0.00–0.07)
Basophils Absolute: 0 10*3/uL (ref 0.0–0.1)
Basophils Relative: 0 %
Eosinophils Absolute: 0 10*3/uL (ref 0.0–0.5)
Eosinophils Relative: 0 %
HCT: 35.2 % — ABNORMAL LOW (ref 36.0–46.0)
Hemoglobin: 10.8 g/dL — ABNORMAL LOW (ref 12.0–15.0)
Immature Granulocytes: 1 %
Lymphocytes Relative: 3 %
Lymphs Abs: 0.4 10*3/uL — ABNORMAL LOW (ref 0.7–4.0)
MCH: 30 pg (ref 26.0–34.0)
MCHC: 30.7 g/dL (ref 30.0–36.0)
MCV: 97.8 fL (ref 80.0–100.0)
Monocytes Absolute: 0.6 10*3/uL (ref 0.1–1.0)
Monocytes Relative: 4 %
Neutro Abs: 12.6 10*3/uL — ABNORMAL HIGH (ref 1.7–7.7)
Neutrophils Relative %: 92 %
Platelets: 302 10*3/uL (ref 150–400)
RBC: 3.6 MIL/uL — ABNORMAL LOW (ref 3.87–5.11)
RDW: 16.3 % — ABNORMAL HIGH (ref 11.5–15.5)
WBC: 13.7 10*3/uL — ABNORMAL HIGH (ref 4.0–10.5)
nRBC: 0 % (ref 0.0–0.2)

## 2019-06-21 LAB — URINALYSIS, ROUTINE W REFLEX MICROSCOPIC
Bilirubin Urine: NEGATIVE
Glucose, UA: NEGATIVE mg/dL
Hgb urine dipstick: NEGATIVE
Ketones, ur: NEGATIVE mg/dL
Nitrite: POSITIVE — AB
Protein, ur: 100 mg/dL — AB
Specific Gravity, Urine: 1.015 (ref 1.005–1.030)
WBC, UA: >50 WBC/hpf — ABNORMAL HIGH (ref 0–5)
pH: 5 (ref 5.0–8.0)

## 2019-06-21 LAB — APTT: aPTT: 33 seconds (ref 24–36)

## 2019-06-21 LAB — TRIGLYCERIDES: Triglycerides: 119 mg/dL (ref <150)

## 2019-06-21 LAB — C-REACTIVE PROTEIN: CRP: 13.1 mg/dL — ABNORMAL HIGH (ref <1.0)

## 2019-06-21 LAB — PROCALCITONIN: Procalcitonin: 0.24 ng/mL

## 2019-06-21 LAB — PROTIME-INR
INR: 1.1 (ref 0.8–1.2)
Prothrombin Time: 13.9 seconds (ref 11.4–15.2)

## 2019-06-21 LAB — FERRITIN: Ferritin: 68 ng/mL (ref 11–307)

## 2019-06-21 LAB — CBG MONITORING, ED: Glucose-Capillary: 137 mg/dL — ABNORMAL HIGH (ref 70–99)

## 2019-06-21 LAB — FIBRINOGEN: Fibrinogen: 553 mg/dL — ABNORMAL HIGH (ref 210–475)

## 2019-06-21 LAB — LACTIC ACID, PLASMA
Lactic Acid, Venous: 1.5 mmol/L (ref 0.5–1.9)
Lactic Acid, Venous: 1.3 mmol/L (ref 0.5–1.9)

## 2019-06-21 LAB — D-DIMER, QUANTITATIVE: D-Dimer, Quant: 2.47 ug/mL-FEU — ABNORMAL HIGH (ref 0.00–0.50)

## 2019-06-21 MED ORDER — AMLODIPINE BESYLATE 5 MG PO TABS
10.0000 mg | ORAL_TABLET | Freq: Every day | ORAL | Status: DC
  Administered 2019-06-22: 10 mg via ORAL
  Filled 2019-06-21: qty 2

## 2019-06-21 MED ORDER — RALOXIFENE HCL 60 MG PO TABS
60.0000 mg | ORAL_TABLET | Freq: Every day | ORAL | Status: DC
  Filled 2019-06-21: qty 1

## 2019-06-21 MED ORDER — SODIUM CHLORIDE 0.9 % IV SOLN
1000.0000 mL | INTRAVENOUS | Status: DC
  Administered 2019-06-21: 1000 mL via INTRAVENOUS

## 2019-06-21 MED ORDER — ADULT MULTIVITAMIN W/MINERALS CH
1.0000 | ORAL_TABLET | Freq: Every day | ORAL | Status: DC
  Administered 2019-06-22 – 2019-06-28 (×7): 1 via ORAL
  Filled 2019-06-21 (×7): qty 1

## 2019-06-21 MED ORDER — ACETAMINOPHEN 325 MG PO TABS: 650.0000 mg | ORAL_TABLET | Freq: Four times a day (QID) | ORAL | Status: DC | PRN

## 2019-06-21 MED ORDER — VANCOMYCIN HCL 10 G IV SOLR
1500.0000 mg | INTRAVENOUS | Status: DC
  Filled 2019-06-21: qty 1500

## 2019-06-21 MED ORDER — MECLIZINE HCL 12.5 MG PO TABS: 25.0000 mg | ORAL_TABLET | Freq: Three times a day (TID) | ORAL | Status: DC | PRN

## 2019-06-21 MED ORDER — SODIUM CHLORIDE 0.9 % IV SOLN
500.0000 mg | INTRAVENOUS | Status: DC
  Administered 2019-06-21: 500 mg via INTRAVENOUS
  Filled 2019-06-21: qty 500

## 2019-06-21 MED ORDER — ONDANSETRON HCL 4 MG PO TABS: 4.0000 mg | ORAL_TABLET | Freq: Four times a day (QID) | ORAL | Status: DC | PRN

## 2019-06-21 MED ORDER — POLYETHYLENE GLYCOL 3350 17 G PO PACK: 17.0000 g | PACK | Freq: Every day | ORAL | Status: DC | PRN

## 2019-06-21 MED ORDER — HEPARIN SODIUM (PORCINE) 5000 UNIT/ML IJ SOLN
5000.0000 [IU] | Freq: Three times a day (TID) | INTRAMUSCULAR | Status: DC
  Administered 2019-06-21 – 2019-06-22 (×3): 5000 [IU] via SUBCUTANEOUS
  Filled 2019-06-21 (×3): qty 1

## 2019-06-21 MED ORDER — ACETAMINOPHEN 650 MG RE SUPP: 650.0000 mg | Freq: Four times a day (QID) | RECTAL | Status: DC | PRN

## 2019-06-21 MED ORDER — ONDANSETRON HCL 4 MG/2ML IJ SOLN
4.0000 mg | Freq: Four times a day (QID) | INTRAMUSCULAR | Status: DC | PRN
  Administered 2019-06-25: 4 mg via INTRAVENOUS
  Filled 2019-06-21: qty 2

## 2019-06-21 MED ORDER — ASPIRIN EC 81 MG PO TBEC
81.0000 mg | DELAYED_RELEASE_TABLET | Freq: Every day | ORAL | Status: DC
  Administered 2019-06-22 – 2019-06-28 (×7): 81 mg via ORAL
  Filled 2019-06-21 (×7): qty 1

## 2019-06-21 MED ORDER — SODIUM CHLORIDE 0.9 % IV SOLN: 2.0000 g | Freq: Once | INTRAVENOUS | Status: DC

## 2019-06-21 MED ORDER — VANCOMYCIN HCL IN DEXTROSE 1-5 GM/200ML-% IV SOLN
1000.0000 mg | INTRAVENOUS | Status: AC
  Administered 2019-06-21 – 2019-06-22 (×2): 1000 mg via INTRAVENOUS
  Filled 2019-06-21 (×2): qty 200

## 2019-06-21 MED ORDER — POTASSIUM CHLORIDE 10 MEQ/100ML IV SOLN
10.0000 meq | Freq: Once | INTRAVENOUS | Status: AC
  Administered 2019-06-21: 10 meq via INTRAVENOUS
  Filled 2019-06-21: qty 100

## 2019-06-21 MED ORDER — ACETAMINOPHEN 325 MG PO TABS
650.0000 mg | ORAL_TABLET | Freq: Four times a day (QID) | ORAL | Status: DC | PRN
  Administered 2019-06-27: 650 mg via ORAL
  Filled 2019-06-21: qty 2

## 2019-06-21 MED ORDER — FAMOTIDINE 20 MG PO TABS
10.0000 mg | ORAL_TABLET | Freq: Every day | ORAL | Status: DC
  Administered 2019-06-22 – 2019-06-28 (×7): 10 mg via ORAL
  Filled 2019-06-21 (×7): qty 1

## 2019-06-21 MED ORDER — SODIUM CHLORIDE 0.9 % IV SOLN: 2.0000 g | INTRAVENOUS | Status: DC

## 2019-06-21 MED ORDER — SODIUM CHLORIDE 0.9 % IV SOLN
2.0000 g | INTRAVENOUS | Status: DC
  Administered 2019-06-21: 19:00:00 2 g via INTRAVENOUS
  Filled 2019-06-21: qty 20

## 2019-06-21 MED ORDER — LACTATED RINGERS IV SOLN
INTRAVENOUS | Status: DC
  Administered 2019-06-21 – 2019-06-22 (×3): via INTRAVENOUS

## 2019-06-21 MED ORDER — DULOXETINE HCL 60 MG PO CPEP
60.0000 mg | ORAL_CAPSULE | Freq: Every morning | ORAL | Status: DC
  Administered 2019-06-22 – 2019-06-28 (×7): 60 mg via ORAL
  Filled 2019-06-21: qty 1
  Filled 2019-06-21 (×2): qty 2
  Filled 2019-06-21 (×5): qty 1

## 2019-06-21 MED ORDER — ISOSORBIDE MONONITRATE ER 30 MG PO TB24
30.0000 mg | ORAL_TABLET | Freq: Every day | ORAL | Status: DC
  Administered 2019-06-22: 30 mg via ORAL
  Filled 2019-06-21 (×3): qty 1

## 2019-06-21 MED ORDER — SODIUM CHLORIDE 0.9 % IV SOLN: 250.0000 mL | INTRAVENOUS | Status: DC | PRN

## 2019-06-21 MED ORDER — BISACODYL 10 MG RE SUPP: 10.0000 mg | Freq: Every day | RECTAL | Status: DC | PRN

## 2019-06-21 MED ORDER — TRAMADOL HCL 50 MG PO TABS
50.0000 mg | ORAL_TABLET | Freq: Three times a day (TID) | ORAL | Status: DC | PRN
  Administered 2019-06-21 – 2019-06-25 (×3): 50 mg via ORAL
  Filled 2019-06-21 (×4): qty 1

## 2019-06-21 MED ORDER — DILTIAZEM HCL 25 MG/5ML IV SOLN
10.0000 mg | Freq: Once | INTRAVENOUS | Status: AC
  Administered 2019-06-21: 10 mg via INTRAVENOUS
  Filled 2019-06-21: qty 5

## 2019-06-21 MED ORDER — SODIUM CHLORIDE 0.9% FLUSH: 3.0000 mL | INTRAVENOUS | Status: DC | PRN

## 2019-06-21 MED ORDER — LORATADINE 10 MG PO TABS
10.0000 mg | ORAL_TABLET | Freq: Every day | ORAL | Status: DC
  Administered 2019-06-22 – 2019-06-28 (×7): 10 mg via ORAL
  Filled 2019-06-21 (×7): qty 1

## 2019-06-21 MED ORDER — LEVOTHYROXINE SODIUM 50 MCG PO TABS
50.0000 ug | ORAL_TABLET | Freq: Every day | ORAL | Status: DC
  Administered 2019-06-22 – 2019-06-28 (×8): 50 ug via ORAL
  Filled 2019-06-21: qty 1
  Filled 2019-06-21: qty 2
  Filled 2019-06-21 (×2): qty 1
  Filled 2019-06-21: qty 2
  Filled 2019-06-21: qty 1
  Filled 2019-06-21: qty 2
  Filled 2019-06-21: qty 1

## 2019-06-21 MED ORDER — SODIUM CHLORIDE 0.9% FLUSH
3.0000 mL | Freq: Two times a day (BID) | INTRAVENOUS | Status: DC
  Administered 2019-06-22 – 2019-06-28 (×13): 3 mL via INTRAVENOUS

## 2019-06-21 MED ORDER — ALPRAZOLAM 0.5 MG PO TABS
0.2500 mg | ORAL_TABLET | Freq: Three times a day (TID) | ORAL | Status: DC | PRN
  Administered 2019-06-22 – 2019-06-25 (×5): 0.5 mg via ORAL
  Filled 2019-06-21 (×6): qty 1

## 2019-06-21 MED ORDER — ATORVASTATIN CALCIUM 40 MG PO TABS
40.0000 mg | ORAL_TABLET | Freq: Every day | ORAL | Status: DC
  Administered 2019-06-21 – 2019-06-27 (×7): 40 mg via ORAL
  Filled 2019-06-21 (×7): qty 1

## 2019-06-21 NOTE — H&P (Addendum)
History and Physical    Tamara Stein DUK:[id number] DOB: 12-29-1940 DOA: 06/21/2019  PCP: Janora Norlander, DO  Patient coming from: Home  I have personally briefly reviewed patient's old medical records in [place]  Chief Complaint: Weakness  HPI: Tamara Stein is a 78 y.o. female with medical history significant of anxiety, depression, pretension, chronic kidney diseasestage II, spinal cerebellar degeneration, hypothyroidism and diverticulitis who presented to the ER with weakness, nausea, vague abdominal discomfort.  Patient reports she has been having progressive weakness over the last few days and has been feeling unwell.  Today she started having nausea and some lower abdominal discomfort.  No vomiting, diarrhea reported.  She was also complaining of some shortness of breath mainly with exertion.  EMS was called with above-mentioned complaint.  She was noted to be tachycardic and tachypneic with mild hypoxemia.  Review of records shows patient was recently admitted in October initially for colitis and then subsequently for healthcare associated pneumonia.  Has a history of recurrent UTIs in the past.  Did not have any fever, chills at home.  Noted to be hypothermic on arrival.  Was also noted to be in new onset atrial fibrillation with RVR and received Cardizem IV in the ER. ED Course:  Vital Signs reviewed on presentation, significant for temperature 96.9, blood pressure 127/76, heart rate 102, saturation 94% on room air. Labs reviewed, significant for sodium 138, potassium 3.1, BUN 20, creatinine 1.56, albumin 3.1, LDH 164, ferritin 68, CRP 13.1, lactic acid 1.5, procalcitonin 0.24, WBC count 13.7, hemoglobin 10.8, hematocrit 35, platelets 302, D-dimer 2.47, ferritin fibrinogen 553, INR 1.1, PTT 33, SARS-CoV-2 RT-PCR has been sent.  Urinalysis shows more than 50 WBCs, moderate leukocytes, positive nitrite, many bacteria. Imaging personally Reviewed, chest x-ray shows  cardiomegaly with central vascular congestion and small pleural effusions with moderate large hiatal hernia. EKG personally reviewed, shows atrial fibrillation with rapid ventricular response.  Review of Systems: As per HPI otherwise 10 point review of systems negative.  All other review of systems is negative except the ones noted above in the HPI.  Past Medical History:  Diagnosis Date  . Allergy   . Anxiety   . Cataract   . 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 Chronic kidney disease   . Chronic knee pain   . Depression   . GERD (gastroesophageal reflux disease)   . Hyperlipidemia   . Hypertension   . Thyroid disease     Past Surgical History:  Procedure Laterality Date  . ABDOMINAL HYSTERECTOMY    . CHOLECYSTECTOMY    . FLEXIBLE SIGMOIDOSCOPY N/A 06/05/2019   Procedure: FLEXIBLE SIGMOIDOSCOPY;  Surgeon: Rogene Houston, MD;  Location: [facility];  Service: Endoscopy;  Laterality: N/A;  . MOUTH SURGERY    . RIGHT ELBOW       reports that she has never smoked. She has never used smokeless tobacco. She reports that she does not drink alcohol or use drugs.  Allergies  Allergen Reactions  . Sulfa Antibiotics Anaphylaxis    Facial swelling   . Codeine Other (See Comments)    BLACK OUTS  . Doxycycline Nausea Only  . Erythromycin Swelling  . Ibuprofen Other (See Comments)    Stomach pain, muscle spasm, GI bleeding  . Levaquin [Levofloxacin] Other (See Comments)    Insomnia/trouble breathing  . Penicillins Diarrhea    Has patient had a PCN reaction causing immediate rash, facial/tongue/throat swelling, SOB or lightheadedness with hypotension: No Has patient had a PCN  reaction causing severe rash involving mucus membranes or skin necrosis: No Has patient had a PCN reaction that required hospitalization Unknown Has patient had a PCN reaction occurring within the last 10 years: No If all of the above answers are "NO", then may proceed with Cephalosporin use.     Family  History  Problem Relation Age of Onset  . Arthritis Sister   . Hyperlipidemia Sister   . Hypertension Sister   . Cancer Brother   . Diabetes Brother   . Heart disease Brother    Family history reviewed, noted as above, not pertinent to current presentation.   Prior to Admission medications   Medication Sig Start Date End Date Taking? Authorizing Provider  acetaminophen (TYLENOL) 325 MG tablet Take 2 tablets (650 mg total) by mouth every 6 (six) hours as needed for mild pain, fever or headache (or Fever >/= 101). 06/05/19  Yes Roxan Hockey, MD  ALPRAZolam Duanne Moron) 0.5 MG tablet Take 1 tablet (0.5 mg total) by mouth 3 (three) times daily as needed for sleep or anxiety. Patient taking differently: Take 0.25-0.5 mg by mouth 3 (three) times daily as needed for sleep or anxiety.  06/09/19 06/08/20 Yes Emokpae, Courage, MD  amLODipine (NORVASC) 10 MG tablet Take 1 tablet (10 mg total) by mouth daily. for blood pressure 06/05/19  Yes Emokpae, Courage, MD  aspirin EC 81 MG tablet Take 1 tablet (81 mg total) by mouth daily with breakfast. 06/05/19  Yes Emokpae, Courage, MD  Bacillus Coagulans-Inulin (ALIGN PREBIOTIC-PROBIOTIC) 5-1.25 MG-GM CHEW Chew 1 tablet by mouth daily. 11/14/18  Yes Isla Pence, MD  Calcium Carbonate-Vitamin D (CALTRATE 600+D PO) Take 1 tablet by mouth daily.   Yes [provider]  cetirizine (ZYRTEC) 10 MG chewable tablet Chew 10 mg by mouth daily.   Yes [provider]  diclofenac sodium (VOLTAREN) 1 % GEL Apply 4 g topically 4 (four) times daily. (for arthritis to REPLACE oral diclofenac) 06/13/19  Yes Gottschalk, Ashly M, DO  DULoxetine (CYMBALTA) 60 MG capsule Take 1 capsule (60 mg total) by mouth daily. Patient taking differently: Take 60 mg by mouth every morning.  04/27/19  Yes Gottschalk, Leatrice Jewels M, DO  furosemide (LASIX) 20 MG tablet Take 1 tablet (20 mg total) by mouth daily. For swelling/Fluid 06/09/19  Yes Emokpae, Courage, MD  hydrALAZINE  (APRESOLINE) 50 MG tablet Take 1 tablet (50 mg total) by mouth 3 (three) times daily. For BP 06/05/19  Yes Emokpae, Courage, MD  isosorbide mononitrate (IMDUR) 30 MG 24 hr tablet Take 1 tablet (30 mg total) by mouth daily. 06/06/19  Yes Emokpae, Courage, MD  levothyroxine (SYNTHROID) 50 MCG tablet TAKE 1 TABLET ONCE DAILY Patient taking differently: Take 50 mcg by mouth daily before breakfast.  04/26/19  Yes Gottschalk, Ashly M, DO  lisinopril (ZESTRIL) 10 MG tablet Take 10 mg by mouth daily.   Yes [provider]  meclizine (ANTIVERT) 25 MG tablet Take 25 mg by mouth 3 (three) times daily as needed for dizziness.   Yes [provider]  Multiple Vitamin (MULTIVITAMIN WITH MINERALS) TABS tablet Take 1 tablet by mouth daily.   Yes [provider]  omeprazole (PRILOSEC) 40 MG capsule TAKE 1 CAPSULE DAILY Patient taking differently: Take 40 mg by mouth daily before breakfast.  04/26/19  Yes Gottschalk, Ashly M, DO  ondansetron (ZOFRAN) 4 MG tablet Take 1 tablet (4 mg total) by mouth every 6 (six) hours as needed for nausea. 06/09/19  Yes Roxan Hockey, MD  raloxifene (EVISTA)  60 MG tablet Take 1 tablet (60 mg total) by mouth daily. Patient taking differently: Take 60 mg by mouth every morning.  04/27/19  Yes Gottschalk, Leatrice Jewels M, DO  atorvastatin (LIPITOR) 40 MG tablet Take 1 tablet (40 mg total) by mouth at bedtime. Do not restart until instructed by MD Patient not taking: Reported on 06/21/2019 05/14/19   Orson Eva, MD  famotidine (PEPCID AC) 10 MG tablet Take 1 tablet (10 mg total) by mouth daily. 06/20/19   Janora Norlander, DO  atorvastatin (LIPITOR) 40 MG tablet Take 1 tablet (40 mg total) by mouth at bedtime. 11/05/18   Janora Norlander, DO  DULoxetine (CYMBALTA) 60 MG capsule Take 1 capsule (60 mg total) by mouth daily. 11/05/18   Janora Norlander, DO    Physical Exam: Vitals:   06/21/19 2130 06/21/19 2145 06/21/19 2200 06/21/19 2230  BP: (!) 109/97  111/70  122/85  Pulse: 69 (!) 109 64 (!) 114  Resp: (!) 30 (!) 28 (!) 21 (!) 28  Temp:      TempSrc:      SpO2: 93% 96% 94% 93%  Weight:      Height:        Constitutional: NAD, calm, comfortable Vitals:   06/21/19 2130 06/21/19 2145 06/21/19 2200 06/21/19 2230  BP: (!) 109/97  111/70 122/85  Pulse: 69 (!) 109 64 (!) 114  Resp: (!) 30 (!) 28 (!) 21 (!) 28  Temp:      TempSrc:      SpO2: 93% 96% 94% 93%  Weight:      Height:       Eyes: PERRL, lids and conjunctivae normal ENMT: Mucous membranes are dry.  Neck: normal, supple, no masses, no thyromegaly Respiratory: Decreased air entry bilaterally, scattered crepitations.   Cardiovascular: Irregular rhythm, tachycardia, no murmurs / rubs / gallops. No extremity edema. 2+ pedal pulses. No carotid bruits.  Abdomen: Mild lower abdominal tenderness, no guarding or rigidity, no masses palpated. No hepatosplenomegaly. Bowel sounds positive.  Musculoskeletal: no clubbing / cyanosis. No joint deformity upper and lower extremities. Skin: no rashes, lesions, ulcers. No induration Neurologic: CN 2-12 grossly intact. Sensation intact, DTR normal.  Power is 3/5 in both lower extremities, bilateral upper extremity tremors, more on right Psychiatric: Normal judgment and insight. Alert and oriented x 3. Normal mood.    Decubitus Ulcers: Not present on admission Catheters and tubes: None   Labs on Admission: I have personally reviewed following labs and imaging studies  CBC: Recent Labs  Lab 06/21/19 1857  WBC 13.7*  NEUTROABS 12.6*  HGB 10.8*  HCT 35.2*  MCV 97.8  PLT 361   Basic Metabolic Panel: Recent Labs  Lab 06/21/19 1857  NA 138  K 3.1*  CL 103  CO2 22  GLUCOSE 140*  BUN 28*  CREATININE 1.56*  CALCIUM 8.3*   GFR: Estimated Creatinine Clearance: 31 mL/min (A) (by C-G formula based on SCr of 1.56 mg/dL (H)). Liver Function Tests: Recent Labs  Lab 06/21/19 1857  AST 28  ALT 22  ALKPHOS 68  BILITOT 0.9  PROT 6.2*   ALBUMIN 3.1*   No results for input(s): LIPASE, AMYLASE in the last 168 hours. No results for input(s): AMMONIA in the last 168 hours. Coagulation Profile: Recent Labs  Lab 06/21/19 1857  INR 1.1   Cardiac Enzymes: No results for input(s): CKTOTAL, CKMB, CKMBINDEX, TROPONINI in the last 168 hours. BNP (last 3 results) No results for input(s): PROBNP in the last 8760  hours. HbA1C: No results for input(s): HGBA1C in the last 72 hours. CBG: Recent Labs  Lab 06/21/19 1839  GLUCAP 137*   Lipid Profile: Recent Labs    06/21/19 1858  TRIG 119   Thyroid Function Tests: No results for input(s): TSH, T4TOTAL, FREET4, T3FREE, THYROIDAB in the last 72 hours. Anemia Panel: Recent Labs    06/21/19 1858  FERRITIN 68   Urine analysis:    Component Value Date/Time   COLORURINE AMBER (A) 06/21/2019 1903   APPEARANCEUR CLOUDY (A) 06/21/2019 1903   APPEARANCEUR Cloudy (A) 01/25/2019 1425   LABSPEC 1.015 06/21/2019 1903   PHURINE 5.0 06/21/2019 1903   GLUCOSEU NEGATIVE 06/21/2019 [place] 06/21/2019 [place] 06/21/2019 1903   BILIRUBINUR Negative 01/25/2019 [place] 06/21/2019 1903   PROTEINUR 100 (A) 06/21/2019 1903   UROBILINOGEN negative 09/06/2015 1155   UROBILINOGEN 0.2 03/25/2014 1359   NITRITE POSITIVE (A) 06/21/2019 1903   LEUKOCYTESUR MODERATE (A) 06/21/2019 1903    Radiological Exams on Admission: Dg Chest Port 1 View  Result Date: 06/21/2019 CLINICAL DATA:  Cough and short of breath EXAM: PORTABLE CHEST 1 VIEW COMPARISON:  06/07/2019 FINDINGS: Mild cardiomegaly with central vascular congestion. Small pleural effusions. 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 Aortic atherosclerosis. IMPRESSION: 1. Cardiomegaly with central vascular congestion and small pleural effusions. 2. 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 Electronically Signed   By: Donavan Foil M.D.   On: 06/21/2019 19:44      Assessment/Plan Active Problems:   GERD  (gastroesophageal reflux disease)   Essential hypertension with goal blood pressure less than 140/90   Hyperlipidemia   Cerebellar degeneration (HCC)   Depression   Hypothyroid   CKD (chronic kidney disease) stage 3, GFR 30-59 ml/min (HCC)   Sepsis (H[facility])     Principal Problem: Sepsis possibly secondary to UTI Patient presented with weakness, nausea, lower abdominal pain.  Noted to be tachypneic, tachycardic, hypothermic on presentation. Review of records shows patient was admitted from 06/07/2019-06/09/2019 with healthcare associated pneumonia and bilateral pleural effusions and discharged on p.o. antibiotics.  Was admitted prior to that from 05/30/2019-06/05/2019 with colitis and had a flex sigmoidoscopy at the time with no significant findings. Has had multiple episodes of UTI in the past with urine cultures growing E. coli and Proteus which are usually sensitive to cephalosporins. Plan: We will place on IV antibiotics Follow blood and urine cultures Monitor CBC, temp, lactic acid  Other Active Problems:  New onset atrial fibrillation with rapid ventricular response Patient presented with heart rate in the 120s to 130s.  Received 1 dose of Cardizem IV in the ER with good response. We will place on telemetry Serial troponins Echocardiogram in a.m. Consider cardiology consult TSH ordered for AM  Mild acute on chronic renal failure with baseline CKD stage III: Creatinine 1.65 at presentation, baseline appears to be around 1.1-1.2. Likely secondary to sepsis, UTI.  Will give IV fluid resuscitation and monitor input output, renal function closely.  Hypertension: Continue amlodipine.  Hold hydralazine, lisinopril due to low pressures, AKI  Hyperlipidemia: Continue Lipitor  Hypothyroidism: Continue levothyroxine  Depression/anxiety: Continue Cymbalta, Xanax as needed  GERD/hiatal hernia: Continue famotidine, will place on IV PPI  Chronic normocytic anemia: Hemoglobin  10.8, appears to be at baseline.  No evidence of bleeding.  History of spinocerebellar degeneration: Patient has significant debility secondary to above, wheelchair-bound at baseline, only has minimal mobility at home.  Will get PT/OT evaluation for continued therapy during hospital  stay.  Expect discharge back to home with home based equipment.   DVT prophylaxis: Heparin Code Status: DNR Family Communication: N/A  Disposition Plan: Admit as inpatient, evaluation for UTI, new onset A. fib, expect 3 to 4-day stay, disposition based on PT/OT evaluation as well Consults called: N/A Admission status: Inpatient   Lynetta Mare MD [facility]  If 7PM-7AM, please contact night-coverage   06/21/2019, 11:40 PM

## 2019-06-21 NOTE — Progress Notes (Addendum)
Pharmacy Antibiotic Note  Tamara Stein is a 78 y.o. female admitted on 06/21/2019 with UTI  and sepsis.  Pharmacy has been consulted for vancomycin  dosing.  Patient is also currently on ceftriaxone.  Urinalysis is nitrite  and leukocyte esterase positive with many bacteria.   Plan: Loading dose: vancomycin 2g total Maintenance dose:   vancomycin 1.5g IV  q36 h Goal vancomycin trough range:  15-20   mcg/mL Pharmacy will continue to monitor renal function, vancomycin troughs as clinically appropriate,  cultures and patient progress.    Height: 5' 2"  (157.5 cm) Weight: 204 lb 9.4 oz (92.8 kg) IBW/kg (Calculated) : 50.1  Temp (24hrs), Avg:97.2 F (36.2 C), Min:96.9 F (36.1 C), Max:97.5 F (36.4 C)  Recent Labs  Lab 06/21/19 1857 06/21/19 2049  WBC 13.7*  --   CREATININE 1.56*  --   LATICACIDVEN 1.5 1.3    Estimated Creatinine Clearance: 31 mL/min (A) (by C-G formula based on SCr of 1.56 mg/dL (H)).    Allergies  Allergen Reactions  . Sulfa Antibiotics Anaphylaxis    Facial swelling   . Codeine Other (See Comments)    BLACK OUTS  . Doxycycline Nausea Only  . Erythromycin Swelling  . Ibuprofen Other (See Comments)    Stomach pain, muscle spasm, GI bleeding  . Levaquin [Levofloxacin] Other (See Comments)    Insomnia/trouble breathing  . Penicillins Diarrhea    Has patient had a PCN reaction causing immediate rash, facial/tongue/throat swelling, SOB or lightheadedness with hypotension: No Has patient had a PCN reaction causing severe rash involving mucus membranes or skin necrosis: No Has patient had a PCN reaction that required hospitalization Unknown Has patient had a PCN reaction occurring within the last 10 years: No If all of the above answers are "NO", then may proceed with Cephalosporin use.     Antimicrobials this admission: azithromycin 11/10 >>  11/10 ceftriaxone 11/10 >>  vancomycin 11/11>>  Dose adjustments this admission: Vancomycin    Microbiology results: 11/10  [facility] x2: pending 11/10 UCx:   pendng 11/10 SARS CoV-2: pending 10/28 MRSA PCR: neg  Thank you for allowing pharmacy to participate in this patient's care.  Despina Pole 06/21/2019 11:28 PM

## 2019-06-21 NOTE — Progress Notes (Addendum)
Patient-reported Beta-Lactam Allergy Assessment  Specific drug that caused reaction: Penicillin Reaction(s) that occurred: diarrhea  Antibiotics tolerated in the past: . Patient reported:   Marland Kitchen Historical data obtained from the EMR:  Ceftriaxone  Information received from:  Medical record  Based on the above interview, it has been deemed appropriate for patient to be administered Ceftriaxone as the risk for cross-reactivity to documented reaction is low. She has tolerated cephalosporins without issues.   Onnie Boer, PharmD, BCIDP, AAHIVP, CPP Infectious Disease Pharmacist 06/21/2019 6:58 PM   Addendum:  Pt has had an extensive hx of urine cultures. Most were either e.coli, klebsiella or proteus. They were all sens to ceftriaxone. D/w Dr. Sabra Heck and we'll continue with ceftriaxone rather than cefepime.   Onnie Boer, PharmD, BCIDP, AAHIVP, CPP Infectious Disease Pharmacist 06/21/2019 9:25 PM

## 2019-06-21 NOTE — ED Triage Notes (Signed)
Pt brought in by [facility] with c/o difficulty breathing and generalized weakness. Pt recently diagnosed with PNA. A. Fib on the monitor, HR 120, ETCO2 23, temp 97.5, O2 sat 90% on RA increased to 96% on 4L O2, resp 30. EMS called Code Sepsis.  Dr. Sabra Heck at bedside.

## 2019-06-21 NOTE — ED Provider Notes (Signed)
[facility] EMERGENCY DEPARTMENT Provider Note   CSN: [id number] Arrival date & time: 06/21/19  1832     History   Chief Complaint Chief Complaint  Patient presents with  . Code Sepsis    HPI Tamara Stein is a 78 y.o. female.     HPI  This patient is a 78 year old female with a known history of hypertension, hyperlipidemia, cerebellar degeneration with a recent admission to the hospital for shortness of breath.  She was admitted on October 27 and discharged on October 29.  She was diagnosed with healthcare associated pneumonia and bilateral pleural effusions.  She had been admitted the week prior to that for colitis, had some elevated liver function tests and an acute on chronic kidney injury.  There was a flex sig on October 25 which showed no significant findings.  She was given ceftriaxone and Flagyl, discharged on Omnicef and Flagyl and when she was discharged from the hospital on the 29th, she was discharged on Omnicef to cover for possible healthcare associated pneumonia.  Since being home the patient has had a progressive weakness, she reports that she is having some vague nausea, there has been no vomiting, she has been feeling more and more weak and progressively more uncomfortable with a dry mouth and increasing coughing and shortness of breath.  She was found to be hypoxic by the paramedics who report that the patient was tachypneic, tachycardic in atrial fibrillation with rapid ventricular rate of which the patient does not have a history.  There was no fever measured, she was not hypotensive.  A code sepsis was activated from the field and she was given Rocephin as well as 500 cc of IV fluids.  Past Medical History:  Diagnosis Date  . Allergy   . Anxiety   . Cataract   . 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 Chronic kidney disease   . Chronic knee pain   . Depression   . GERD (gastroesophageal reflux disease)   . Hyperlipidemia   . Hypertension   . Thyroid disease      Patient Active Problem List   Diagnosis Date Noted  . Ascending aortic aneurysm ([place]) 06/13/2019  . Pleural effusion 06/08/2019  . Dyspnea 06/07/2019  . Diarrhea   . Abnormal LFTs   . Symptomatic anemia 06/01/2019  . Nausea vomiting and diarrhea   . Lesion of spleen   . Diverticulitis 05/04/2019  . Internal impingement of left shoulder 12/24/2017  . Anxiety 04/20/2017  . 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 Incontinence of urine 04/20/2017  . UTI (urinary tract infection) 07/15/2016  . Abdominal pain 07/15/2016  . Colitis 07/15/2016  . Syncope and collapse 04/26/2016  . Lower urinary tract infectious disease 04/26/2016  . Fall   . Daytime somnolence 03/10/2016  . Diplopia 03/10/2016  . Insomnia 03/10/2016  . Sleep apnea in adult 03/10/2016  . Imbalance 03/07/2016  . Migraine 03/07/2016  . Tremor 03/07/2016  . Pain of both shoulder joints 02/29/2016  . Arthritis 09/24/2015  . Constipation 06/05/2015  . Depression 06/05/2015  . Hypothyroid 06/05/2015  . 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 GERD (gastroesophageal reflux disease)   . Essential hypertension with goal blood pressure less than 140/90   . Hyperlipidemia   . Cerebellar degeneration ([place])   . CKD (chronic kidney disease) stage 3, GFR 30-59 ml/min (HCC) 12/02/2013    Past Surgical History:  Procedure Laterality Date  . ABDOMINAL HYSTERECTOMY    . CHOLECYSTECTOMY    . FLEXIBLE SIGMOIDOSCOPY N/A 06/05/2019  Procedure: FLEXIBLE SIGMOIDOSCOPY;  Surgeon: Rogene Houston, MD;  Location: [facility]O [facility];  Service: Endoscopy;  Laterality: N/A;  . MOUTH SURGERY    . RIGHT ELBOW       OB History   No obstetric history on file.      Home Medications    Prior to Admission medications   Medication Sig Start Date End Date Taking? Authorizing Provider  acetaminophen (TYLENOL) 325 MG tablet Take 2 tablets (650 mg total) by mouth every 6 (six) hours as needed for mild pain, fever or headache (or Fever  >/= 101). 06/05/19  Yes Roxan Hockey, MD  ALPRAZolam Duanne Moron) 0.5 MG tablet Take 1 tablet (0.5 mg total) by mouth 3 (three) times daily as needed for sleep or anxiety. Patient taking differently: Take 0.25-0.5 mg by mouth 3 (three) times daily as needed for sleep or anxiety.  06/09/19 06/08/20 Yes Emokpae, Courage, MD  amLODipine (NORVASC) 10 MG tablet Take 1 tablet (10 mg total) by mouth daily. for blood pressure 06/05/19  Yes Emokpae, Courage, MD  aspirin EC 81 MG tablet Take 1 tablet (81 mg total) by mouth daily with breakfast. 06/05/19  Yes Emokpae, Courage, MD  Bacillus Coagulans-Inulin (ALIGN PREBIOTIC-PROBIOTIC) 5-1.25 MG-GM CHEW Chew 1 tablet by mouth daily. 11/14/18  Yes Isla Pence, MD  Calcium Carbonate-Vitamin D (CALTRATE 600+D PO) Take 1 tablet by mouth daily.   Yes [provider]  cetirizine (ZYRTEC) 10 MG chewable tablet Chew 10 mg by mouth daily.   Yes [provider]  diclofenac sodium (VOLTAREN) 1 % GEL Apply 4 g topically 4 (four) times daily. (for arthritis to REPLACE oral diclofenac) 06/13/19  Yes Gottschalk, Ashly M, DO  DULoxetine (CYMBALTA) 60 MG capsule Take 1 capsule (60 mg total) by mouth daily. Patient taking differently: Take 60 mg by mouth every morning.  04/27/19  Yes Gottschalk, Leatrice Jewels M, DO  furosemide (LASIX) 20 MG tablet Take 1 tablet (20 mg total) by mouth daily. For swelling/Fluid 06/09/19  Yes Emokpae, Courage, MD  hydrALAZINE (APRESOLINE) 50 MG tablet Take 1 tablet (50 mg total) by mouth 3 (three) times daily. For BP 06/05/19  Yes Emokpae, Courage, MD  isosorbide mononitrate (IMDUR) 30 MG 24 hr tablet Take 1 tablet (30 mg total) by mouth daily. 06/06/19  Yes Emokpae, Courage, MD  levothyroxine (SYNTHROID) 50 MCG tablet TAKE 1 TABLET ONCE DAILY Patient taking differently: Take 50 mcg by mouth daily before breakfast.  04/26/19  Yes Gottschalk, Ashly M, DO  lisinopril (ZESTRIL) 10 MG tablet Take 10 mg by mouth daily.   Yes [provider]  meclizine (ANTIVERT) 25 MG tablet Take 25 mg by mouth 3 (three) times daily as needed for dizziness.   Yes [provider]  Multiple Vitamin (MULTIVITAMIN WITH MINERALS) TABS tablet Take 1 tablet by mouth daily.   Yes [provider]  omeprazole (PRILOSEC) 40 MG capsule TAKE 1 CAPSULE DAILY Patient taking differently: Take 40 mg by mouth daily before breakfast.  04/26/19  Yes Gottschalk, Ashly M, DO  ondansetron (ZOFRAN) 4 MG tablet Take 1 tablet (4 mg total) by mouth every 6 (six) hours as needed for nausea. 06/09/19  Yes Emokpae, Courage, MD  raloxifene (EVISTA) 60 MG tablet Take 1 tablet (60 mg total) by mouth daily. Patient taking differently: Take 60 mg by mouth every morning.  04/27/19  Yes Gottschalk, Leatrice Jewels M, DO  atorvastatin (LIPITOR) 40 MG tablet Take 1 tablet (40 mg total) by mouth at bedtime. Do not restart until instructed  by MD Patient not taking: Reported on 06/21/2019 05/14/19   Orson Eva, MD  famotidine (PEPCID AC) 10 MG tablet Take 1 tablet (10 mg total) by mouth daily. 06/20/19   Janora Norlander, DO  atorvastatin (LIPITOR) 40 MG tablet Take 1 tablet (40 mg total) by mouth at bedtime. 11/05/18   Janora Norlander, DO  DULoxetine (CYMBALTA) 60 MG capsule Take 1 capsule (60 mg total) by mouth daily. 11/05/18   Janora Norlander, DO    Family History Family History  Problem Relation Age of Onset  . Arthritis Sister   . Hyperlipidemia Sister   . Hypertension Sister   . Cancer Brother   . Diabetes Brother   . Heart disease Brother     Social History Social History   Tobacco Use  . Smoking status: Never Smoker  . Smokeless tobacco: Never Used  Substance Use Topics  . Alcohol use: No  . Drug use: No     Allergies   Sulfa antibiotics, Codeine, Doxycycline, Erythromycin, Ibuprofen, Levaquin [levofloxacin], and Penicillins   Review of Systems Review of Systems  All other systems reviewed and are negative.    Physical Exam Updated Vital  Signs BP 104/69   Pulse 71   Temp (!) 96.9 F (36.1 C) (Rectal)   Resp (!) 24   Ht 1.575 m (5' 2" )   Wt 92.8 kg   SpO2 94%   BMI 37.42 kg/m   Physical Exam Vitals signs and nursing note reviewed.  Constitutional:      General: She is in acute distress.     Appearance: She is well-developed. She is ill-appearing.  HENT:     Head: Normocephalic and atraumatic.     Mouth/Throat:     Mouth: Mucous membranes are dry.     Pharynx: No oropharyngeal exudate.  Eyes:     General: No scleral icterus.       Right eye: No discharge.        Left eye: No discharge.     Conjunctiva/sclera: Conjunctivae normal.     Pupils: Pupils are equal, round, and reactive to light.  Neck:     Musculoskeletal: Normal range of motion and neck supple.     Thyroid: No thyromegaly.     Vascular: No JVD.  Cardiovascular:     Rate and Rhythm: Tachycardia present. Rhythm irregular.     Heart sounds: Normal heart sounds. No murmur. No friction rub. No gallop.   Pulmonary:     Effort: Respiratory distress present.     Breath sounds: Rales present. No wheezing.  Abdominal:     General: Bowel sounds are normal. There is no distension.     Palpations: Abdomen is soft. There is no mass.     Tenderness: There is no abdominal tenderness.  Musculoskeletal: Normal range of motion.        General: No tenderness.     Right lower leg: Edema present.     Left lower leg: Edema present.  Lymphadenopathy:     Cervical: No cervical adenopathy.  Skin:    General: Skin is warm and dry.     Findings: No erythema or rash.  Neurological:     Mental Status: She is alert.     Coordination: Coordination normal.  Psychiatric:        Behavior: Behavior normal.      ED Treatments / Results  Labs (all labs ordered are listed, but only abnormal results are displayed) Labs Reviewed  COMPREHENSIVE METABOLIC PANEL -  Abnormal; Notable for the following components:      Result Value   Potassium 3.1 (*)    Glucose, Bld 140  (*)    BUN 28 (*)    Creatinine, Ser 1.56 (*)    Calcium 8.3 (*)    Total Protein 6.2 (*)    Albumin 3.1 (*)    GFR calc non Af Amer 31 (*)    GFR calc Af Amer 36 (*)    All other components within normal limits  CBC WITH DIFFERENTIAL/PLATELET - Abnormal; Notable for the following components:   WBC 13.7 (*)    RBC 3.60 (*)    Hemoglobin 10.8 (*)    HCT 35.2 (*)    RDW 16.3 (*)    Neutro Abs 12.6 (*)    Lymphs Abs 0.4 (*)    Abs Immature Granulocytes 0.09 (*)    All other components within normal limits  URINALYSIS, ROUTINE W REFLEX MICROSCOPIC - Abnormal; Notable for the following components:   Color, Urine AMBER (*)    APPearance CLOUDY (*)    Protein, ur 100 (*)    Nitrite POSITIVE (*)    Leukocytes,Ua MODERATE (*)    WBC, UA >50 (*)    Bacteria, UA MANY (*)    All other components within normal limits  D-DIMER, QUANTITATIVE (NOT AT [facility]) - Abnormal; Notable for the following components:   D-Dimer, Quant 2.47 (*)    All other components within normal limits  FIBRINOGEN - Abnormal; Notable for the following components:   Fibrinogen 553 (*)    All other components within normal limits  C-REACTIVE PROTEIN - Abnormal; Notable for the following components:   CRP 13.1 (*)    All other components within normal limits  CBG MONITORING, ED - Abnormal; Notable for the following components:   Glucose-Capillary 137 (*)    All other components within normal limits  CULTURE, BLOOD (ROUTINE X 2)  CULTURE, BLOOD (ROUTINE X 2)  URINE CULTURE  SARS CORONAVIRUS 2 (TAT 6-24 HRS)  LACTIC ACID, PLASMA  APTT  PROTIME-INR  PROCALCITONIN  LACTATE DEHYDROGENASE  FERRITIN  TRIGLYCERIDES  LACTIC ACID, PLASMA    EKG EKG Interpretation  Date/Time:  Tuesday June 21 2019 18:41:07 EST Ventricular Rate:  124 PR Interval:    QRS Duration: 94 QT Interval:  325 QTC Calculation: 467 R Axis:   83 Text Interpretation: Atrial fibrillation Borderline right axis deviation Low voltage,  precordial leads Nonspecific T abnormalities, inferior leads Minimal ST elevation, lateral leads Since last tracing Atrial fibrillation NOW PRESENT Confirmed by Noemi Chapel [phone]) on 06/21/2019 6:46:03 PM   Radiology No results found.  Procedures .Critical Care Performed by: Noemi Chapel, MD Authorized by: Noemi Chapel, MD   Critical care provider statement:    Critical care time (minutes):  35   Critical care time was exclusive of:  Separately billable procedures and treating other patients and teaching time   Critical care was necessary to treat or prevent imminent or life-threatening deterioration of the following conditions:  Respiratory failure   Critical care was time spent personally by me on the following activities:  Blood draw for specimens, development of treatment plan with patient or surrogate, discussions with consultants, evaluation of patient's response to treatment, examination of patient, obtaining history from patient or surrogate, ordering and performing treatments and interventions, ordering and review of laboratory studies, ordering and review of radiographic studies, pulse oximetry, re-evaluation of patient's condition and review of old charts   (including critical care time)  Medications Ordered in ED Medications  0.9 %  sodium chloride infusion (1,000 mLs Intravenous New Bag/Given 06/21/19 2056)  cefTRIAXone (ROCEPHIN) 2 g in sodium chloride 0.9 % 100 mL IVPB ( Intravenous Stopped 06/21/19 1943)  azithromycin (ZITHROMAX) 500 mg in sodium chloride 0.9 % 250 mL IVPB ( Intravenous Stopped 06/21/19 2054)  ceFEPIme (MAXIPIME) 2 g in sodium chloride 0.9 % 100 mL IVPB (has no administration in time range)  diltiazem (CARDIZEM) injection 10 mg (10 mg Intravenous Given 06/21/19 1942)     Initial Impression / Assessment and Plan / ED Course  I have reviewed the triage vital signs and the nursing notes.  Pertinent labs & imaging results that were available during my  care of the patient were reviewed by me and considered in my medical decision making (see chart for details).  Clinical Course as of Jun 20 2109  Tue Jun 21, 2019  1935 Today the patient has a leukocytosis of 13,700 which is approximately twice what it was prior.  Her hemoglobin is approximately 1 g higher than it was at her prior visit.  Heart rate is currently between 120 and 148, temperature is 96.9 by rectal route   [BM]    Clinical Course User Index [BM] Noemi Chapel, MD       This patient has increased work of breathing, she is tachycardic in atrial fibrillation with a rapid ventricular rate.  She will need labs and imaging to rule out other causes of her symptoms although I suspect she may have some fluid overload and third spacing from prior admissions.  This may also be related to worsening kidney function, dehydration etc.  She does appear critically ill and will likely need admission to the hospital.  She denies any definite exposures to coronavirus but will need to be retested.  Urinalysis reveals many bacteria and greater than 50 white blood cells.  This is particularly concerning since she has been on a third-generation cephalosporin, she has some altered renal function, her potassium is 3.1, creatinine is 1.56 with a BUN of 28 and a white blood cell count of 13,700.  Lactic acid is 1.5  Blood pressure has remained low normal, temperature low at 96.9, she is hypothermic, tachypneic, leukocytosis and meets criteria for sepsis, with some renal dysfunction this is considered severe sepsis and the patient will need to be admitted to the hospital.  Hospitalist paged at 9:10 PM  Antibiotics given per the sepsis order set POtassium repleted HR has improved on small dose of cardizem' D/w hosptialist who will admit  Vitals:   06/21/19 1944 06/21/19 1945 06/21/19 1954 06/21/19 2030  BP:    104/69  Pulse: 93 98 82 71  Resp: (!) 30 (!) 33 20 (!) 24  Temp:      TempSrc:       SpO2: 95% 94% 94% 94%  Weight:      Height:        Florentine Ky was evaluated in Emergency Department on 06/21/2019 for the symptoms described in the history of present illness. She was evaluated in the context of the global COVID-19 pandemic, which necessitated consideration that the patient might be at risk for infection with the SARS-CoV-2 virus that causes COVID-19. Institutional protocols and algorithms that pertain to the evaluation of patients at risk for COVID-19 are in a state of rapid change based on information released by regulatory bodies including the CDC and federal and state organizations. These policies and algorithms were followed during the patient's care  in the ED.   Final Clinical Impressions(s) / ED Diagnoses   Final diagnoses:  Severe sepsis ([place])  Acute cystitis without hematuria  Hypokalemia    ED Discharge Orders    None       Noemi Chapel, MD 06/21/19 2121

## 2019-06-22 ENCOUNTER — Inpatient Hospital Stay (HOSPITAL_COMMUNITY): Payer: Medicare Other

## 2019-06-22 DIAGNOSIS — R6520 Severe sepsis without septic shock: Secondary | ICD-10-CM

## 2019-06-22 DIAGNOSIS — A419 Sepsis, unspecified organism: Secondary | ICD-10-CM

## 2019-06-22 DIAGNOSIS — G319 Degenerative disease of nervous system, unspecified: Secondary | ICD-10-CM

## 2019-06-22 DIAGNOSIS — N3000 Acute cystitis without hematuria: Secondary | ICD-10-CM

## 2019-06-22 DIAGNOSIS — I4891 Unspecified atrial fibrillation: Secondary | ICD-10-CM

## 2019-06-22 DIAGNOSIS — N1832 Chronic kidney disease, stage 3b: Secondary | ICD-10-CM

## 2019-06-22 DIAGNOSIS — E876 Hypokalemia: Secondary | ICD-10-CM

## 2019-06-22 LAB — BASIC METABOLIC PANEL
Anion gap: 13 (ref 5–15)
BUN: 31 mg/dL — ABNORMAL HIGH (ref 8–23)
CO2: 24 mmol/L (ref 22–32)
Calcium: 8.1 mg/dL — ABNORMAL LOW (ref 8.9–10.3)
Chloride: 104 mmol/L (ref 98–111)
Creatinine, Ser: 1.65 mg/dL — ABNORMAL HIGH (ref 0.44–1.00)
GFR calc Af Amer: 34 mL/min — ABNORMAL LOW (ref >60)
GFR calc non Af Amer: 29 mL/min — ABNORMAL LOW (ref >60)
Glucose, Bld: 141 mg/dL — ABNORMAL HIGH (ref 70–99)
Potassium: 3.3 mmol/L — ABNORMAL LOW (ref 3.5–5.1)
Sodium: 141 mmol/L (ref 135–145)

## 2019-06-22 LAB — CBC
HCT: 31.3 % — ABNORMAL LOW (ref 36.0–46.0)
Hemoglobin: 9.6 g/dL — ABNORMAL LOW (ref 12.0–15.0)
MCH: 30.1 pg (ref 26.0–34.0)
MCHC: 30.7 g/dL (ref 30.0–36.0)
MCV: 98.1 fL (ref 80.0–100.0)
Platelets: 258 10*3/uL (ref 150–400)
RBC: 3.19 MIL/uL — ABNORMAL LOW (ref 3.87–5.11)
RDW: 16.3 % — ABNORMAL HIGH (ref 11.5–15.5)
WBC: 18.6 10*3/uL — ABNORMAL HIGH (ref 4.0–10.5)
nRBC: 0 % (ref 0.0–0.2)

## 2019-06-22 LAB — SARS CORONAVIRUS 2 (TAT 6-24 HRS): SARS Coronavirus 2: NEGATIVE

## 2019-06-22 LAB — MRSA PCR SCREENING: MRSA by PCR: NEGATIVE

## 2019-06-22 LAB — TROPONIN I (HIGH SENSITIVITY)
Troponin I (High Sensitivity): 7 ng/L (ref <18)
Troponin I (High Sensitivity): 8 ng/L (ref <18)
Troponin I (High Sensitivity): 7 ng/L (ref <18)

## 2019-06-22 LAB — TSH: TSH: 4.733 u[IU]/mL — ABNORMAL HIGH (ref 0.350–4.500)

## 2019-06-22 MED ORDER — PANTOPRAZOLE SODIUM 40 MG IV SOLR
40.0000 mg | INTRAVENOUS | Status: DC
  Administered 2019-06-22 – 2019-06-27 (×6): 40 mg via INTRAVENOUS
  Filled 2019-06-22 (×6): qty 40

## 2019-06-22 MED ORDER — CHLORHEXIDINE GLUCONATE CLOTH 2 % EX PADS
6.0000 | MEDICATED_PAD | Freq: Every day | CUTANEOUS | Status: DC
  Administered 2019-06-22 – 2019-06-28 (×5): 6 via TOPICAL

## 2019-06-22 MED ORDER — DILTIAZEM HCL-DEXTROSE 125-5 MG/125ML-% IV SOLN (PREMIX)
5.0000 mg/h | INTRAVENOUS | Status: DC
  Administered 2019-06-22: 5 mg/h via INTRAVENOUS
  Administered 2019-06-23: 15 mg/h via INTRAVENOUS
  Administered 2019-06-23: 11:00:00 10 mg/h via INTRAVENOUS
  Filled 2019-06-22 (×4): qty 125

## 2019-06-22 MED ORDER — VANCOMYCIN HCL IN DEXTROSE 1-5 GM/200ML-% IV SOLN: 1000.0000 mg | INTRAVENOUS | Status: DC

## 2019-06-22 MED ORDER — SODIUM CHLORIDE 0.9 % IV SOLN
1.0000 g | INTRAVENOUS | Status: DC
  Administered 2019-06-22 – 2019-06-24 (×3): 1 g via INTRAVENOUS
  Filled 2019-06-22 (×3): qty 10

## 2019-06-22 MED ORDER — HEPARIN (PORCINE) 25000 UT/250ML-% IV SOLN
1000.0000 [IU]/h | INTRAVENOUS | Status: DC
  Administered 2019-06-22: 19:00:00 1000 [IU]/h via INTRAVENOUS
  Filled 2019-06-22: qty 250

## 2019-06-22 MED ORDER — HEPARIN BOLUS VIA INFUSION
4000.0000 [IU] | Freq: Once | INTRAVENOUS | Status: AC
  Administered 2019-06-22: 19:00:00 4000 [IU] via INTRAVENOUS
  Filled 2019-06-22: qty 4000

## 2019-06-22 NOTE — Progress Notes (Signed)
PT Cancellation Note  Patient Details Name: Tamara Stein MRN: [id number] DOB: 1940-09-26   Cancelled Treatment:    Reason Eval/Treat Not Completed: Medical issues which prohibited therapy.  Physical therapy held due to patient having increased heart rate with exertion.  Will check back tomorrow.   3:59 PM, 06/22/19 Lonell Grandchild, MPT Physical Therapist with [facility] [phone] office [phone] mobile phone

## 2019-06-22 NOTE — Progress Notes (Signed)
PROGRESS NOTE    Tamara Stein  NFA:[id number] DOB: 01-27-1941 DOA: 06/21/2019 PCP: Janora Norlander, DO    Brief Narrative:  78 year old female who presented with weakness.  She does have significant past medical history of anxiety, depression, chronic kidney disease stage II, spinal cerebellar degeneration, hypothyroidism and diverticulosis.  She reported weakness, nausea and vague abdominal discomfort.  She had dyspnea on exertion but no chest pain or palpitations.  On her initial physical examination her temperature was 96.9, blood pressure 127/76 and heart rate 102, oxygen saturation 94% on room air.  Heart S1-S2 present, irregular lungs with decreased breath sounds bilaterally, scattered rails, abdomen protuberant, nontender, positive lower extremity edema.  Urinalysis had specific gravity 1.015, more than 50 white cells.  Chest x-ray with no infiltrates.  EKG 104 bpm, normal axis, atrial fibrillation rhythm, no ST segment or T wave changes.  Patient was admitted to the hospital with working diagnosis of new onset atrial fibrillation complicated by urine tract infection   Assessment & Plan:   Principal Problem:   Atrial fibrillation with RVR (HCC) Active Problems:   GERD (gastroesophageal reflux disease)   Essential hypertension with goal blood pressure less than 140/90   Hyperlipidemia   Cerebellar degeneration (HCC)   Depression   Hypothyroid   CKD (chronic kidney disease) stage 3, GFR 30-59 ml/min ([place])   1. New onset atrial fibrillation with RVR. Patient continue uncontrolled RVR this am, blood pressure 469 to 629 systolic, recent echocardiogram from last month with preserved LV systolic function (no need to repeat study). Will resume IV diltiazem drip for rate control, continue telemetry monitoring. Anticoagulation for now with heparin. Chads VAsc score is 4.   2. Urinary tract infection with sepsis, (end organ damage AKI)./ present on admission.  Will continue  antibiotic therapy with ceftriaxone and will hold on V fluids for now, follow with cell count and cultures.   3. AKI on CKD stage 3a with hypokalemia  Renal function with cr at 1,65 with K at 3,3 and serum bicarbonate at 24, persistent elevation in serum cr, will follow on renal panel in am, avoid hypotension and nephrotoxic medications. High risk for acute heart failure in the setting of afib with RVR will hold on IV fluids for now. Had K correction on admission.   4. HTN with dyslipidemia. Continue blood pressure monitoring will continue to hold amlodipine, lisinopril and hdralazine to avoid hypotension. Continue with statin therapy.   5. Hypothyroid. Continue with levothyroxine.   6. Chronic anemia. Multifactorial continue to follow cell count.   7. Spinocerebellar degeneration. Ambulatory dysfunction, will need physical therapy evaluation.     DVT prophylaxis: heparin    Code Status:  dnr  Family Communication:  No family at the bedside  Disposition Plan/ discharge barriers: pending clinical improvement.   Body mass index is 37.42 kg/m. Malnutrition Type:      Malnutrition Characteristics:      Nutrition Interventions:     RN Pressure Injury Documentation:     Consultants:     Procedures:     Antimicrobials:       Subjective: Patient continue to have generalized weakness, no chest pain or palpitation, positive dyspnea.   Objective: Vitals:   06/22/19 1516 06/22/19 1526 06/22/19 1527 06/22/19 1533  BP: 129/66 (!) 122/105  107/75  Pulse: 65 85 (!) 111 (!) 124  Resp: 19 18 20 19   Temp:      TempSrc:      SpO2: (!) 89% 95% 94% 92%  Weight:      Height:        Intake/Output Summary (Last 24 hours) at 06/22/2019 1601 Last data filed at 06/22/2019 0855 Gross per 24 hour  Intake 1862.5 ml  Output -  Net 1862.5 ml   Filed Weights   06/21/19 1842  Weight: 92.8 kg    Examination:   General: Not in pain or dyspnea , deconditioned and ill  looking appearing Neurology: Awake and alert, non focal  E ENT: positive pallor, no icterus, oral mucosa moist Cardiovascular: No JVD. S1-S2 present, irregularly irregular, no gallops, rubs, or murmurs. +++ non pitting lower extremity edema. Pulmonary: positive breath sounds bilaterally, adequate air movement, no wheezing, rhonchi or rales. Gastrointestinal. Abdomen with no organomegaly, non tender, no rebound or guarding Skin. No rashes Musculoskeletal: no joint deformities     Data Reviewed: I have personally reviewed following labs and imaging studies  CBC: Recent Labs  Lab 06/21/19 1857 06/22/19 0400  WBC 13.7* 18.6*  NEUTROABS 12.6*  --   HGB 10.8* 9.6*  HCT 35.2* 31.3*  MCV 97.8 98.1  PLT 302 350   Basic Metabolic Panel: Recent Labs  Lab 06/21/19 1857 06/22/19 0400  NA 138 141  K 3.1* 3.3*  CL 103 104  CO2 22 24  GLUCOSE 140* 141*  BUN 28* 31*  CREATININE 1.56* 1.65*  CALCIUM 8.3* 8.1*   GFR: Estimated Creatinine Clearance: 29.3 mL/min (A) (by C-G formula based on SCr of 1.65 mg/dL (H)). Liver Function Tests: Recent Labs  Lab 06/21/19 1857  AST 28  ALT 22  ALKPHOS 68  BILITOT 0.9  PROT 6.2*  ALBUMIN 3.1*   No results for input(s): LIPASE, AMYLASE in the last 168 hours. No results for input(s): AMMONIA in the last 168 hours. Coagulation Profile: Recent Labs  Lab 06/21/19 1857  INR 1.1   Cardiac Enzymes: No results for input(s): CKTOTAL, CKMB, CKMBINDEX, TROPONINI in the last 168 hours. BNP (last 3 results) No results for input(s): PROBNP in the last 8760 hours. HbA1C: No results for input(s): HGBA1C in the last 72 hours. CBG: Recent Labs  Lab 06/21/19 1839  GLUCAP 137*   Lipid Profile: Recent Labs    06/21/19 1858  TRIG 119   Thyroid Function Tests: Recent Labs    06/22/19 0710  TSH 4.733*   Anemia Panel: Recent Labs    06/21/19 1858  FERRITIN 18      Radiology Studies: I have reviewed all of the imaging during this  hospital visit personally     Scheduled Meds: . amLODipine  10 mg Oral Daily  . aspirin EC  81 mg Oral Q breakfast  . atorvastatin  40 mg Oral QHS  . DULoxetine  60 mg Oral q morning - 10a  . famotidine  10 mg Oral Daily  . heparin  5,000 Units Subcutaneous Q8H  . isosorbide mononitrate  30 mg Oral Daily  . levothyroxine  50 mcg Oral QAC breakfast  . loratadine  10 mg Oral Daily  . multivitamin with minerals  1 tablet Oral Daily  . pantoprazole (PROTONIX) IV  40 mg Intravenous Q24H  . sodium chloride flush  3 mL Intravenous Q12H   Continuous Infusions: . sodium chloride Stopped (06/21/19 2329)  . sodium chloride    . cefTRIAXone (ROCEPHIN)  IV    . diltiazem (CARDIZEM) infusion 12.5 mg/hr (06/22/19 1500)  . lactated ringers 75 mL/hr at 06/22/19 1241  . [START ON 06/23/2019] vancomycin       LOS: 1  day        Karina Lenderman Gerome Apley, MD

## 2019-06-22 NOTE — TOC Initial Note (Signed)
Transition of Care [facility]) - Initial/Assessment Note    Patient Details  Name: Tamara Stein MRN: [id number] Date of Birth: [birth year]  [facility]) CM/SW Contact:    [place], LCSW Phone Number: 06/22/2019, 3:12 PM  Clinical Narrative:  CSW at bedside to conduct TOC assessment. Pt is a 78 yo female who has been admitted for severe sepsis. Pt reports that she currently resides with her brother in law who provides much support for her. Pt goes into detail and states that he assists with things such as ADLs, and transportation to and from medical appointments. Pt adds that she also receives visits from a home health company who provides PT/OT/RN and assistance with bathing 2x weekly.   Pt states that she is not currently on o2 at home but is on it during her ED visit.   Pt has the following DME in the home: hoverround, shower chair and raised toilet seat.    Pt has no hx of receiving short term rehab from SNF but expresses her interest in long term care. CSW and pt engage in conversation related to her being able to qualify for Medicaid. Pt states that she only receives disability income and that it is not that much. Pt states that she makes $2 too much to qualify for medicaid assistance. CSW expresses understanding and empathy.   TOC team will continue to follow patient for any discharge related needs.    Expected Discharge Plan: (P) [place] Barriers to Discharge: (P) Continued Medical Work up   Patient Goals and CMS Choice Patient states their goals for this hospitalization and ongoing recovery are:: (P) patient states that her short term goal is to return home under the supervision of her brother in law, but in the future patient would like to explore more long term care options      Expected Discharge Plan and Services Expected Discharge Plan: (P) [place]                           DME Agency: (P) [employer]                   Prior Living Arrangements/Services     Patient language and need for interpreter reviewed:: (P) Yes Do you feel safe going back to the place where you live?: (P) Yes      Need for Family Participation in Patient Care: (P) Yes (Comment) Care giver support system in place?: (P) Yes (comment)   Criminal Activity/Legal Involvement Pertinent to Current Situation/Hospitalization: (P) No - Comment as needed  Activities of Daily Living Home Assistive Devices/Equipment: Eyeglasses ADL Screening (condition at time of admission) Patient's cognitive ability adequate to safely complete daily activities?: Yes Is the patient deaf or have difficulty hearing?: Yes Does the patient have difficulty seeing, even when wearing glasses/contacts?: Yes Does the patient have difficulty concentrating, remembering, or making decisions?: No Patient able to express need for assistance with ADLs?: Yes Does the patient have difficulty dressing or bathing?: No Independently performs ADLs?: No Communication: Independent Dressing (OT): Dependent Is this a change from baseline?: Pre-admission baseline Grooming: Dependent Is this a change from baseline?: Pre-admission baseline Feeding: Dependent Is this a change from baseline?: Pre-admission baseline Bathing: Dependent Is this a change from baseline?: Pre-admission baseline Toileting: Dependent Is this a change from baseline?: Pre-admission baseline In/Out Bed: Dependent Is this a change from baseline?: Pre-admission baseline Walks in  Home: Dependent Is this a change from baseline?: Pre-admission baseline Does the patient have difficulty walking or climbing stairs?: Yes Weakness of Legs: Both Weakness of Arms/Hands: Both  Permission Sought/Granted Permission sought to share information with : (P) Case Manager Permission granted to share information with : (P) Yes, Verbal Permission Granted              Emotional  Assessment Appearance:: (P) Appears stated age Attitude/Demeanor/Rapport: (P) Engaged, Gracious          Admission diagnosis:  Sepsis Patient Active Problem List   Diagnosis Date Noted  . Sepsis ([place]) 06/21/2019  . Ascending aortic aneurysm ([place]) 06/13/2019  . Pleural effusion 06/08/2019  . Dyspnea 06/07/2019  . Diarrhea   . Abnormal LFTs   . Symptomatic anemia 06/01/2019  . Nausea vomiting and diarrhea   . Lesion of spleen   . Diverticulitis 05/04/2019  . Internal impingement of left shoulder 12/24/2017  . Anxiety 04/20/2017  . 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 Incontinence of urine 04/20/2017  . UTI (urinary tract infection) 07/15/2016  . Abdominal pain 07/15/2016  . Colitis 07/15/2016  . Syncope and collapse 04/26/2016  . Lower urinary tract infectious disease 04/26/2016  . Fall   . Daytime somnolence 03/10/2016  . Diplopia 03/10/2016  . Insomnia 03/10/2016  . Sleep apnea in adult 03/10/2016  . Imbalance 03/07/2016  . Migraine 03/07/2016  . Tremor 03/07/2016  . Pain of both shoulder joints 02/29/2016  . Arthritis 09/24/2015  . Constipation 06/05/2015  . Depression 06/05/2015  . Hypothyroid 06/05/2015  . Spinocerebellar disease ([place]) 12/07/2014  . GERD (gastroesophageal reflux disease)   . Essential hypertension with goal blood pressure less than 140/90   . Hyperlipidemia   . Cerebellar degeneration ([place])   . CKD (chronic kidney disease) stage 3, GFR 30-59 ml/min (HCC) 12/02/2013   PCP:  Janora Norlander, DO Pharmacy:   [id number] [place] [address] [id number]: [phone]: [phone] [facility] [place] [facility] [id number]: [phone]: [phone]    Social Determinants of Health (SDOH) Interventions    Readmission Risk Interventions Readmission  Risk Prevention Plan 05/07/2019  Post Dischage Appt Complete  Medication Screening Complete  Transportation Screening Complete  Some recent data might be hidden

## 2019-06-22 NOTE — ED Notes (Signed)
ED TO INPATIENT HANDOFF REPORT  ED Nurse Name and Phone #: Chania Kochanski [phone]  S Name/Age/Gender Tamara Stein 78 y.o. female Room/Bed: APA09/APA09  Code Status   Code Status: DNR  Home/SNF/Other Home Patient oriented to: self, place, time and situation Is this baseline? Yes   Triage Complete: Triage complete  Chief Complaint Sepsis  Triage Note Pt brought in by [facility] with c/o difficulty breathing and generalized weakness. Pt recently diagnosed with PNA. A. Fib on the monitor, HR 120, ETCO2 23, temp 97.5, O2 sat 90% on RA increased to 96% on 4L O2, resp 30. EMS called Code Sepsis.  Dr. Sabra Heck at bedside.   Allergies Allergies  Allergen Reactions  . Sulfa Antibiotics Anaphylaxis    Facial swelling   . Codeine Other (See Comments)    BLACK OUTS  . Doxycycline Nausea Only  . Erythromycin Swelling  . Ibuprofen Other (See Comments)    Stomach pain, muscle spasm, GI bleeding  . Levaquin [Levofloxacin] Other (See Comments)    Insomnia/trouble breathing  . Penicillins Diarrhea    Has patient had a PCN reaction causing immediate rash, facial/tongue/throat swelling, SOB or lightheadedness with hypotension: No Has patient had a PCN reaction causing severe rash involving mucus membranes or skin necrosis: No Has patient had a PCN reaction that required hospitalization Unknown Has patient had a PCN reaction occurring within the last 10 years: No If all of the above answers are "NO", then may proceed with Cephalosporin use.     Level of Care/Admitting Diagnosis ED Disposition    ED Disposition Condition [place]: [facility] [[id number]]  Level of Care: ICU [6]  Covid Evaluation: Asymptomatic Screening Protocol (No Symptoms)  Diagnosis: Sepsis [facility]) [[id number]]  Admitting Physician: Lynetta Mare [[id number]]  Attending Physician: Lynetta Mare [[id number]]  Estimated length of stay: 3 - 4 days  Certification:: I certify this patient will need  inpatient services for at least 2 midnights  PT Class (Do Not Modify): Inpatient [101]  PT Acc Code (Do Not Modify): Private [1]       B Medical/Surgery History Past Medical History:  Diagnosis Date  . Allergy   . Anxiety   . Cataract   . 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 Chronic kidney disease   . Chronic knee pain   . Depression   . GERD (gastroesophageal reflux disease)   . Hyperlipidemia   . Hypertension   . Thyroid disease    Past Surgical History:  Procedure Laterality Date  . ABDOMINAL HYSTERECTOMY    . CHOLECYSTECTOMY    . FLEXIBLE SIGMOIDOSCOPY N/A 06/05/2019   Procedure: FLEXIBLE SIGMOIDOSCOPY;  Surgeon: Rogene Houston, MD;  Location: [facility];  Service: Endoscopy;  Laterality: N/A;  . MOUTH SURGERY    . RIGHT ELBOW       A IV Location/Drains/Wounds Patient Lines/Drains/Airways Status   Active Line/Drains/Airways    Name:   Placement date:   Placement time:   Site:   Days:   Peripheral IV 06/21/19 Right Antecubital   06/21/19    1838    Antecubital   1   Peripheral IV 06/21/19 Left Hand   06/21/19    1848    Hand   1          Intake/Output Last 24 hours  Intake/Output Summary (Last 24 hours) at 06/22/2019 1549 Last data filed at 06/22/2019 0855 Gross per 24 hour  Intake 1862.5 ml  Output -  Net 1862.5 ml  Labs/Imaging Results for orders placed or performed during the hospital encounter of 06/21/19 (from the past 48 hour(s))  CBG monitoring, ED     Status: Abnormal   Collection Time: 06/21/19  6:39 PM  Result Value Ref Range   Glucose-Capillary 137 (H) 70 - 99 mg/dL  Lactic acid, plasma     Status: None   Collection Time: 06/21/19  6:57 PM  Result Value Ref Range   Lactic Acid, Venous 1.5 0.5 - 1.9 mmol/L    Comment: Performed at [facility], [address], [place], [place] [address]  Comprehensive metabolic panel     Status: Abnormal   Collection Time: 06/21/19  6:57 PM  Result Value Ref Range   Sodium 138 135 - 145 mmol/L    Potassium 3.1 (L) 3.5 - 5.1 mmol/L   Chloride 103 98 - 111 mmol/L   CO2 22 22 - 32 mmol/L   Glucose, Bld 140 (H) 70 - 99 mg/dL   BUN 28 (H) 8 - 23 mg/dL   Creatinine, Ser 1.56 (H) 0.44 - 1.00 mg/dL   Calcium 8.3 (L) 8.9 - 10.3 mg/dL   Total Protein 6.2 (L) 6.5 - 8.1 g/dL   Albumin 3.1 (L) 3.5 - 5.0 g/dL   AST 28 15 - 41 U/L   Stein 22 0 - 44 U/L   Alkaline Phosphatase 68 38 - 126 U/L   Total Bilirubin 0.9 0.3 - 1.2 mg/dL   GFR calc non Af Amer 31 (L) >60 mL/min   GFR calc Af Amer 36 (L) >60 mL/min   Anion gap 13 5 - 15    Comment: Performed at [facility], [address] [place], [place] [address]  CBC WITH DIFFERENTIAL     Status: Abnormal   Collection Time: 06/21/19  6:57 PM  Result Value Ref Range   WBC 13.7 (H) 4.0 - 10.5 K/uL   RBC 3.60 (L) 3.87 - 5.11 MIL/uL   Hemoglobin 10.8 (L) 12.0 - 15.0 g/dL   HCT 35.2 (L) 36.0 - 46.0 %   MCV 97.8 80.0 - 100.0 fL   MCH 30.0 26.0 - 34.0 pg   MCHC 30.7 30.0 - 36.0 g/dL   RDW 16.3 (H) 11.5 - 15.5 %   Platelets 302 150 - 400 K/uL   nRBC 0.0 0.0 - 0.2 %   Neutrophils Relative % 92 %   Neutro Abs 12.6 (H) 1.7 - 7.7 K/uL   Lymphocytes Relative 3 %   Lymphs Abs 0.4 (L) 0.7 - 4.0 K/uL   Monocytes Relative 4 %   Monocytes Absolute 0.6 0.1 - 1.0 K/uL   Eosinophils Relative 0 %   Eosinophils Absolute 0.0 0.0 - 0.5 K/uL   Basophils Relative 0 %   Basophils Absolute 0.0 0.0 - 0.1 K/uL   Immature Granulocytes 1 %   Abs Immature Granulocytes 0.09 (H) 0.00 - 0.07 K/uL    Comment: Performed at [facility], [address], [place], [place] [address]  APTT     Status: None   Collection Time: 06/21/19  6:57 PM  Result Value Ref Range   aPTT 33 24 - 36 seconds    Comment: Performed at [facility], [address], [place], [place] [address]  Protime-INR     Status: None   Collection Time: 06/21/19  6:57 PM  Result Value Ref Range   Prothrombin Time 13.9 11.4 - 15.2 seconds   INR 1.1 0.8 - 1.2    Comment: (NOTE) INR goal varies based on device  and disease states. Performed at [facility], [address] [place], [place] [address]   D-dimer, quantitative     Status: Abnormal   Collection Time: 06/21/19  6:57 PM  Result Value Ref Range   D-Dimer, Quant 2.47 (H) 0.00 - 0.50 ug/mL-FEU    Comment: (NOTE) At the manufacturer cut-off of 0.50 ug/mL FEU, this assay has been documented to exclude PE with a sensitivity and negative predictive value of 97 to 99%.  At this time, this assay has not been approved by the FDA to exclude DVT/VTE. Results should be correlated with clinical presentation. Performed at [facility], [address], [place], [place] [address]   Procalcitonin     Status: None   Collection Time: 06/21/19  6:57 PM  Result Value Ref Range   Procalcitonin 0.24 ng/mL    Comment:        Interpretation: PCT (Procalcitonin) <= 0.5 ng/mL: Systemic infection (sepsis) is not likely. Local bacterial infection is possible. (NOTE)       Sepsis PCT Algorithm           Lower Respiratory Tract                                      Infection PCT Algorithm    ----------------------------     ----------------------------         PCT < 0.25 ng/mL                PCT < 0.10 ng/mL         Strongly encourage             Strongly discourage   discontinuation of antibiotics    initiation of antibiotics    ----------------------------     -----------------------------       PCT 0.25 - 0.50 ng/mL            PCT 0.10 - 0.25 ng/mL               OR       >80% decrease in PCT            Discourage initiation of                                            antibiotics      Encourage discontinuation           of antibiotics    ----------------------------     -----------------------------         PCT >= 0.50 ng/mL              PCT 0.26 - 0.50 ng/mL               AND        <80% decrease in PCT             Encourage initiation of                                             antibiotics       Encourage continuation           of antibiotics     ----------------------------     -----------------------------  PCT >= 0.50 ng/mL                  PCT > 0.50 ng/mL               AND         increase in PCT                  Strongly encourage                                      initiation of antibiotics    Strongly encourage escalation           of antibiotics                                     -----------------------------                                           PCT <= 0.25 ng/mL                                                 OR                                        > 80% decrease in PCT                                     Discontinue / Do not initiate                                             antibiotics Performed at [facility], [address], [place], [place] [address]   Lactate dehydrogenase     Status: None   Collection Time: 06/21/19  6:57 PM  Result Value Ref Range   LDH 164 98 - 192 U/L    Comment: Performed at [facility], [address], [place], [place] [address]  Fibrinogen     Status: Abnormal   Collection Time: 06/21/19  6:57 PM  Result Value Ref Range   Fibrinogen 553 (H) 210 - 475 mg/dL    Comment: Performed at [facility], [address], [place], [place] [address]  Blood Culture (routine x 2)     Status: None (Preliminary result)   Collection Time: 06/21/19  6:58 PM   Specimen: Left Antecubital; Blood  Result Value Ref Range   Specimen Description LEFT ANTECUBITAL    Special Requests      BOTTLES DRAWN AEROBIC AND ANAEROBIC Blood Culture adequate volume   Culture      NO GROWTH < 12 HOURS Performed at [facility], [address] [place], [place] [address]    Report Status PENDING   Ferritin     Status: None   Collection Time: 06/21/19  6:58 PM  Result Value  Ref Range   Ferritin 68 11 - 307 ng/mL    Comment: Performed at [facility], [address], [place], [place] [address]  Triglycerides     Status: None   Collection Time: 06/21/19  6:58 PM  Result Value Ref Range   Triglycerides  119 <150 mg/dL    Comment: Performed at [facility], [address], [place], [place] [address]  C-reactive protein     Status: Abnormal   Collection Time: 06/21/19  6:58 PM  Result Value Ref Range   CRP 13.1 (H) <1.0 mg/dL    Comment: Performed at [facility], [address], [place], [place] [address]  Blood Culture (routine x 2)     Status: None (Preliminary result)   Collection Time: 06/21/19  6:59 PM   Specimen: BLOOD LEFT FOREARM  Result Value Ref Range   Specimen Description BLOOD LEFT FOREARM    Special Requests      BOTTLES DRAWN AEROBIC AND ANAEROBIC Blood Culture adequate volume   Culture      NO GROWTH < 12 HOURS Performed at [facility], [address] [place], [place] [address]    Report Status PENDING   Urinalysis, Routine w reflex microscopic     Status: Abnormal   Collection Time: 06/21/19  7:03 PM  Result Value Ref Range   Color, Urine AMBER (A) YELLOW    Comment: BIOCHEMICALS MAY BE AFFECTED BY COLOR   APPearance CLOUDY (A) CLEAR   Specific Gravity, Urine 1.015 1.005 - 1.030   pH 5.0 5.0 - 8.0   Glucose, UA NEGATIVE NEGATIVE mg/dL   Hgb urine dipstick NEGATIVE NEGATIVE   Bilirubin Urine NEGATIVE NEGATIVE   Ketones, ur NEGATIVE NEGATIVE mg/dL   Protein, ur 100 (A) NEGATIVE mg/dL   Nitrite POSITIVE (A) NEGATIVE   Leukocytes,Ua MODERATE (A) NEGATIVE   RBC / HPF 0-5 0 - 5 RBC/hpf   WBC, UA >50 (H) 0 - 5 WBC/hpf   Bacteria, UA MANY (A) NONE SEEN   Squamous Epithelial / LPF 0-5 0 - 5   WBC Clumps PRESENT    Mucus PRESENT    Budding Yeast PRESENT    Hyaline Casts, UA PRESENT     Comment: Performed at [facility], [address] [place], [place] [address]  Lactic acid, plasma     Status: None   Collection Time: 06/21/19  8:49 PM  Result Value Ref Range   Lactic Acid, Venous 1.3 0.5 - 1.9 mmol/L    Comment: Performed at [facility], [address], [place], [place] [address]  Basic metabolic panel     Status: Abnormal   Collection Time: 06/22/19  4:00 AM   Result Value Ref Range   Sodium 141 135 - 145 mmol/L   Potassium 3.3 (L) 3.5 - 5.1 mmol/L   Chloride 104 98 - 111 mmol/L   CO2 24 22 - 32 mmol/L   Glucose, Bld 141 (H) 70 - 99 mg/dL   BUN 31 (H) 8 - 23 mg/dL   Creatinine, Ser 1.65 (H) 0.44 - 1.00 mg/dL   Calcium 8.1 (L) 8.9 - 10.3 mg/dL   GFR calc non Af Amer 29 (L) >60 mL/min   GFR calc Af Amer 34 (L) >60 mL/min   Anion gap 13 5 - 15    Comment: Performed at [facility], [address] [place], [place] [address]  CBC     Status: Abnormal   Collection Time: 06/22/19  4:00 AM  Result Value Ref Range  WBC 18.6 (H) 4.0 - 10.5 K/uL   RBC 3.19 (L) 3.87 - 5.11 MIL/uL   Hemoglobin 9.6 (L) 12.0 - 15.0 g/dL   HCT 31.3 (L) 36.0 - 46.0 %   MCV 98.1 80.0 - 100.0 fL   MCH 30.1 26.0 - 34.0 pg   MCHC 30.7 30.0 - 36.0 g/dL   RDW 16.3 (H) 11.5 - 15.5 %   Platelets 258 150 - 400 K/uL   nRBC 0.0 0.0 - 0.2 %    Comment: Performed at [facility], [address], [place], [place] [address]  Troponin I (High Sensitivity)     Status: None   Collection Time: 06/22/19  4:00 AM  Result Value Ref Range   Troponin I (High Sensitivity) 7 <18 ng/L    Comment: (NOTE) Elevated high sensitivity troponin I (hsTnI) values and significant  changes across serial measurements may suggest ACS but many other  chronic and acute conditions are known to elevate hsTnI results.  Refer to the "Links" section for chest pain algorithms and additional  guidance. Performed at [facility], [address], [place], [place] [address]   TSH     Status: Abnormal   Collection Time: 06/22/19  7:10 AM  Result Value Ref Range   TSH 4.733 (H) 0.350 - 4.500 uIU/mL    Comment: Performed by a 3rd Generation assay with a functional sensitivity of <=0.01 uIU/mL. Performed at [facility], [address] [place], [place] [address]   Troponin I (High Sensitivity)     Status: None   Collection Time: 06/22/19 10:32 AM  Result Value Ref Range   Troponin I (High Sensitivity) 8 <18  ng/L    Comment: (NOTE) Elevated high sensitivity troponin I (hsTnI) values and significant  changes across serial measurements may suggest ACS but many other  chronic and acute conditions are known to elevate hsTnI results.  Refer to the "Links" section for chest pain algorithms and additional  guidance. Performed at [facility], [address], [place], [place] [address]   Troponin I (High Sensitivity)     Status: None   Collection Time: 06/22/19  2:57 PM  Result Value Ref Range   Troponin I (High Sensitivity) 7 <18 ng/L    Comment: (NOTE) Elevated high sensitivity troponin I (hsTnI) values and significant  changes across serial measurements may suggest ACS but many other  chronic and acute conditions are known to elevate hsTnI results.  Refer to the "Links" section for chest pain algorithms and additional  guidance. Performed at [facility], [address], [place], [place] [address]    Dg Chest Port 1 View  Result Date: 06/21/2019 CLINICAL DATA:  Cough and short of breath EXAM: PORTABLE CHEST 1 VIEW COMPARISON:  06/07/2019 FINDINGS: Mild cardiomegaly with central vascular congestion. Small pleural effusions. 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 Aortic atherosclerosis. IMPRESSION: 1. Cardiomegaly with central vascular congestion and small pleural effusions. 2. 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 Electronically Signed   By: Donavan Foil M.D.   On: 06/21/2019 19:44    Pending Labs Unresulted Labs (From admission, onward)    Start     Ordered   06/21/19 1843  SARS CORONAVIRUS 2 (TAT 6-24 HRS) Nasopharyngeal Nasopharyngeal Swab  (Novel Coronavirus, [facility] Order))  Once,   STAT    Question Answer Comment  Is this test for diagnosis or screening Diagnosis of ill patient   Symptomatic for COVID-19 as defined by CDC Yes   Date of Symptom Onset 06/19/2019   Hospitalized for  COVID-19 No   Admitted to ICU for COVID-19 No   Previously tested for COVID-19 Yes   Resident in a congregate  (group) care setting No   Employed in healthcare setting No   Pregnant No      06/21/19 1843   06/21/19 1842  Urine culture  ONCE - STAT,   STAT     06/21/19 1842          Vitals/Pain Today's Vitals   06/22/19 1526 06/22/19 1527 06/22/19 1533 06/22/19 1533  BP: (!) 122/105  107/75   Pulse: 85 (!) 111 (!) 124   Resp: 18 20 19    Temp:      TempSrc:      SpO2: 95% 94% 92%   Weight:      Height:      PainSc:    2     Isolation Precautions Airborne and Contact precautions  Medications Medications  0.9 %  sodium chloride infusion (0 mLs Intravenous Stopped 06/21/19 2329)  atorvastatin (LIPITOR) tablet 40 mg (40 mg Oral Given 06/21/19 2330)  acetaminophen (TYLENOL) tablet 650 mg (has no administration in time range)  aspirin EC tablet 81 mg (81 mg Oral Given 06/22/19 0857)  amLODipine (NORVASC) tablet 10 mg (10 mg Oral Given 06/22/19 0858)  isosorbide mononitrate (IMDUR) 24 hr tablet 30 mg (30 mg Oral Given 06/22/19 1133)  ALPRAZolam (XANAX) tablet 0.25-0.5 mg (0.5 mg Oral Given 06/22/19 1238)  DULoxetine (CYMBALTA) DR capsule 60 mg (60 mg Oral Given 06/22/19 1132)  levothyroxine (SYNTHROID) tablet 50 mcg (50 mcg Oral Given 06/22/19 0901)  famotidine (PEPCID) tablet 10 mg (10 mg Oral Given 06/22/19 0858)  meclizine (ANTIVERT) tablet 25 mg (has no administration in time range)  multivitamin with minerals tablet 1 tablet (1 tablet Oral Given 06/22/19 0857)  loratadine (CLARITIN) tablet 10 mg (10 mg Oral Given 06/22/19 0901)  lactated ringers infusion ( Intravenous New Bag/Given 06/22/19 1241)  sodium chloride flush (NS) 0.9 % injection 3 mL (3 mLs Intravenous Given 06/22/19 1131)  sodium chloride flush (NS) 0.9 % injection 3 mL (has no administration in time range)  0.9 %  sodium chloride infusion (has no administration in time range)  acetaminophen (TYLENOL) suppository 650 mg (has no administration in time range)  traMADol (ULTRAM) tablet 50 mg (50 mg Oral Given 06/22/19  1238)  polyethylene glycol (MIRALAX / GLYCOLAX) packet 17 g (has no administration in time range)  bisacodyl (DULCOLAX) suppository 10 mg (has no administration in time range)  ondansetron (ZOFRAN) tablet 4 mg (has no administration in time range)    Or  ondansetron (ZOFRAN) injection 4 mg (has no administration in time range)  heparin injection 5,000 Units (5,000 Units Subcutaneous Given 06/22/19 1457)  cefTRIAXone (ROCEPHIN) 2 g in sodium chloride 0.9 % 100 mL IVPB (has no administration in time range)  vancomycin (VANCOCIN) 1,500 mg in sodium chloride 0.9 % 500 mL IVPB (has no administration in time range)  pantoprazole (PROTONIX) injection 40 mg (40 mg Intravenous Given 06/22/19 0902)  diltiazem (CARDIZEM) 125 mg in dextrose 5% 125 mL (1 mg/mL) infusion (12.5 mg/hr Intravenous Rate/Dose Change 06/22/19 1500)  diltiazem (CARDIZEM) injection 10 mg (10 mg Intravenous Given 06/21/19 1942)  potassium chloride 10 mEq in 100 mL IVPB ( Intravenous Stopped 06/21/19 2240)  vancomycin (VANCOCIN) IVPB 1000 mg/200 mL premix (0 mg Intravenous Stopped 06/22/19 0139)    Mobility walks with device Moderate fall risk   Focused Assessments    R Recommendations: See Admitting Provider Note  Report given to:   Additional Notes:

## 2019-06-22 NOTE — Progress Notes (Signed)
ANTICOAGULATION CONSULT NOTE - Initial Consult  Pharmacy Consult for heparin Indication: atrial fibrillation  Allergies  Allergen Reactions  . Sulfa Antibiotics Anaphylaxis    Facial swelling   . Codeine Other (See Comments)    BLACK OUTS  . Doxycycline Nausea Only  . Erythromycin Swelling  . Ibuprofen Other (See Comments)    Stomach pain, muscle spasm, GI bleeding  . Levaquin [Levofloxacin] Other (See Comments)    Insomnia/trouble breathing  . Penicillins Diarrhea    Has patient had a PCN reaction causing immediate rash, facial/tongue/throat swelling, SOB or lightheadedness with hypotension: No Has patient had a PCN reaction causing severe rash involving mucus membranes or skin necrosis: No Has patient had a PCN reaction that required hospitalization Unknown Has patient had a PCN reaction occurring within the last 10 years: No If all of the above answers are "NO", then may proceed with Cephalosporin use.     Patient Measurements: Height: 5' 2"  (157.5 cm) Weight: 191 lb 9.3 oz (86.9 kg) IBW/kg (Calculated) : 50.1 Heparin Dosing Weight: 70 kg  Vital Signs: Temp: 97.8 F (36.6 C) (11/11 1241) Temp Source: Oral (11/11 1241) BP: 107/75 (11/11 1533) Pulse Rate: 78 (11/11 1800)  Labs: Recent Labs    06/21/19 1857 06/22/19 0400 06/22/19 1032 06/22/19 1457  HGB 10.8* 9.6*  --   --   HCT 35.2* 31.3*  --   --   PLT 302 258  --   --   APTT 33  --   --   --   LABPROT 13.9  --   --   --   INR 1.1  --   --   --   CREATININE 1.56* 1.65*  --   --   TROPONINIHS  --  7 8 7     Estimated Creatinine Clearance: 28.3 mL/min (A) (by C-G formula based on SCr of 1.65 mg/dL (H)).   Medical History: Past Medical History:  Diagnosis Date  . Allergy   . Anxiety   . Cataract   . 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 Chronic kidney disease   . Chronic knee pain   . Depression   . GERD (gastroesophageal reflux disease)   . Hyperlipidemia   . Hypertension   . Thyroid disease      Medications:  Medications Prior to Admission  Medication Sig Dispense Refill Last Dose  . acetaminophen (TYLENOL) 325 MG tablet Take 2 tablets (650 mg total) by mouth every 6 (six) hours as needed for mild pain, fever or headache (or Fever >/= 101). 120 tablet 2 NKNOWN  . ALPRAZolam (XANAX) 0.5 MG tablet Take 1 tablet (0.5 mg total) by mouth 3 (three) times daily as needed for sleep or anxiety. (Patient taking differently: Take 0.25-0.5 mg by mouth 3 (three) times daily as needed for sleep or anxiety. ) 15 tablet 0 06/21/2019 at Unknown time  . amLODipine (NORVASC) 10 MG tablet Take 1 tablet (10 mg total) by mouth daily. for blood pressure 90 tablet 1 06/21/2019 at Unknown time  . aspirin EC 81 MG tablet Take 1 tablet (81 mg total) by mouth daily with breakfast. 120 tablet 2 06/21/2019 at Unknown time  . Bacillus Coagulans-Inulin (ALIGN PREBIOTIC-PROBIOTIC) 5-1.25 MG-GM CHEW Chew 1 tablet by mouth daily. 30 tablet 0 Past Month at Unknown time  . Calcium Carbonate-Vitamin D (CALTRATE 600+D PO) Take 1 tablet by mouth daily.   Past Month at Unknown time  . cetirizine (ZYRTEC) 10 MG chewable tablet Chew 10 mg by mouth daily.  06/21/2019 at Unknown time  . diclofenac sodium (VOLTAREN) 1 % GEL Apply 4 g topically 4 (four) times daily. (for arthritis to REPLACE oral diclofenac) 200 g 0 06/21/2019 at Unknown time  . DULoxetine (CYMBALTA) 60 MG capsule Take 1 capsule (60 mg total) by mouth daily. (Patient taking differently: Take 60 mg by mouth every morning. ) 90 capsule 0 06/21/2019 at Unknown time  . furosemide (LASIX) 20 MG tablet Take 1 tablet (20 mg total) by mouth daily. For swelling/Fluid 30 tablet 1 06/21/2019 at Unknown time  . hydrALAZINE (APRESOLINE) 50 MG tablet Take 1 tablet (50 mg total) by mouth 3 (three) times daily. For BP 90 tablet 3 06/21/2019 at Unknown time  . isosorbide mononitrate (IMDUR) 30 MG 24 hr tablet Take 1 tablet (30 mg total) by mouth daily. 30 tablet 3 06/21/2019 at  Unknown time  . levothyroxine (SYNTHROID) 50 MCG tablet TAKE 1 TABLET ONCE DAILY (Patient taking differently: Take 50 mcg by mouth daily before breakfast. ) 90 tablet 1 06/21/2019 at Unknown time  . lisinopril (ZESTRIL) 10 MG tablet Take 10 mg by mouth daily.   06/21/2019 at Unknown time  . meclizine (ANTIVERT) 25 MG tablet Take 25 mg by mouth 3 (three) times daily as needed for dizziness.   UNKNOWN  . Multiple Vitamin (MULTIVITAMIN WITH MINERALS) TABS tablet Take 1 tablet by mouth daily.   Past Month at Unknown time  . omeprazole (PRILOSEC) 40 MG capsule TAKE 1 CAPSULE DAILY (Patient taking differently: Take 40 mg by mouth daily before breakfast. ) 90 capsule 1 06/21/2019 at Unknown time  . ondansetron (ZOFRAN) 4 MG tablet Take 1 tablet (4 mg total) by mouth every 6 (six) hours as needed for nausea. 20 tablet 0 UNKNOWN  . raloxifene (EVISTA) 60 MG tablet Take 1 tablet (60 mg total) by mouth daily. (Patient taking differently: Take 60 mg by mouth every morning. ) 90 tablet 1 06/21/2019 at Unknown time  . atorvastatin (LIPITOR) 40 MG tablet Take 1 tablet (40 mg total) by mouth at bedtime. Do not restart until instructed by MD (Patient not taking: Reported on 06/21/2019) 90 tablet 0 Not Taking at Unknown time  . famotidine (PEPCID AC) 10 MG tablet Take 1 tablet (10 mg total) by mouth daily. 30 tablet 0    Scheduled:  . aspirin EC  81 mg Oral Q breakfast  . atorvastatin  40 mg Oral QHS  . Chlorhexidine Gluconate Cloth  6 each Topical Daily  . DULoxetine  60 mg Oral q morning - 10a  . famotidine  10 mg Oral Daily  . levothyroxine  50 mcg Oral QAC breakfast  . loratadine  10 mg Oral Daily  . multivitamin with minerals  1 tablet Oral Daily  . pantoprazole (PROTONIX) IV  40 mg Intravenous Q24H  . sodium chloride flush  3 mL Intravenous Q12H   Infusions:  . sodium chloride    . cefTRIAXone (ROCEPHIN)  IV    . diltiazem (CARDIZEM) infusion 12.5 mg/hr (06/22/19 1500)    Assessment: Tamara Stein was  admitted yesterday for UTI. She developed new onset afib. She is not on outpt anticoagulant. Baseline INR 1.1, PTT wnl. IV heparin has been ordered tonight for anticoagulation.   Goal of Therapy:  Heparin level 0.3-0.7 units/ml Monitor platelets by anticoagulation protocol: Yes   Plan:  Heparin bolus 4000 units x1 Heparin drip at 1000 units/hr Check 8 hr HL Daily HL and CBC  Onnie Boer, PharmD, BCIDP, AAHIVP, CPP Infectious Disease Pharmacist 06/22/2019 6:37  PM

## 2019-06-22 NOTE — ED Notes (Signed)
PT c/o pain to her neck and back and anxiety and requesting her PRN medication.

## 2019-06-23 LAB — CBC WITH DIFFERENTIAL/PLATELET
Abs Immature Granulocytes: 0.08 10*3/uL — ABNORMAL HIGH (ref 0.00–0.07)
Basophils Absolute: 0.1 10*3/uL (ref 0.0–0.1)
Basophils Relative: 1 %
Eosinophils Absolute: 0.1 10*3/uL (ref 0.0–0.5)
Eosinophils Relative: 1 %
HCT: 28.8 % — ABNORMAL LOW (ref 36.0–46.0)
Hemoglobin: 8.8 g/dL — ABNORMAL LOW (ref 12.0–15.0)
Immature Granulocytes: 1 %
Lymphocytes Relative: 9 %
Lymphs Abs: 0.9 10*3/uL (ref 0.7–4.0)
MCH: 30 pg (ref 26.0–34.0)
MCHC: 30.6 g/dL (ref 30.0–36.0)
MCV: 98.3 fL (ref 80.0–100.0)
Monocytes Absolute: 1.1 10*3/uL — ABNORMAL HIGH (ref 0.1–1.0)
Monocytes Relative: 11 %
Neutro Abs: 7.9 10*3/uL — ABNORMAL HIGH (ref 1.7–7.7)
Neutrophils Relative %: 77 %
Platelets: 221 10*3/uL (ref 150–400)
RBC: 2.93 MIL/uL — ABNORMAL LOW (ref 3.87–5.11)
RDW: 15.9 % — ABNORMAL HIGH (ref 11.5–15.5)
WBC: 10.2 10*3/uL (ref 4.0–10.5)
nRBC: 0.2 % (ref 0.0–0.2)

## 2019-06-23 LAB — BASIC METABOLIC PANEL
Anion gap: 15 (ref 5–15)
BUN: 36 mg/dL — ABNORMAL HIGH (ref 8–23)
CO2: 22 mmol/L (ref 22–32)
Calcium: 7.8 mg/dL — ABNORMAL LOW (ref 8.9–10.3)
Chloride: 104 mmol/L (ref 98–111)
Creatinine, Ser: 1.79 mg/dL — ABNORMAL HIGH (ref 0.44–1.00)
GFR calc Af Amer: 31 mL/min — ABNORMAL LOW (ref >60)
GFR calc non Af Amer: 26 mL/min — ABNORMAL LOW (ref >60)
Glucose, Bld: 119 mg/dL — ABNORMAL HIGH (ref 70–99)
Potassium: 3.2 mmol/L — ABNORMAL LOW (ref 3.5–5.1)
Sodium: 141 mmol/L (ref 135–145)

## 2019-06-23 LAB — HEPARIN LEVEL (UNFRACTIONATED)
Heparin Unfractionated: 1.44 IU/mL — ABNORMAL HIGH (ref 0.30–0.70)
Heparin Unfractionated: 0.96 IU/mL — ABNORMAL HIGH (ref 0.30–0.70)

## 2019-06-23 MED ORDER — LACTATED RINGERS IV SOLN
INTRAVENOUS | Status: AC
  Administered 2019-06-23: 17:00:00 via INTRAVENOUS

## 2019-06-23 MED ORDER — HEPARIN (PORCINE) 25000 UT/250ML-% IV SOLN
700.0000 [IU]/h | INTRAVENOUS | Status: DC
  Administered 2019-06-23: 600 [IU]/h via INTRAVENOUS
  Administered 2019-06-23: 750 [IU]/h via INTRAVENOUS
  Filled 2019-06-23: qty 250

## 2019-06-23 MED ORDER — POTASSIUM CHLORIDE 10 MEQ/100ML IV SOLN
10.0000 meq | INTRAVENOUS | Status: AC
  Administered 2019-06-23 – 2019-06-24 (×4): 10 meq via INTRAVENOUS
  Filled 2019-06-23 (×4): qty 100

## 2019-06-23 MED ORDER — DILTIAZEM HCL 60 MG PO TABS
60.0000 mg | ORAL_TABLET | Freq: Four times a day (QID) | ORAL | Status: DC
  Administered 2019-06-23 – 2019-06-26 (×12): 60 mg via ORAL
  Filled 2019-06-23 (×12): qty 1

## 2019-06-23 NOTE — Progress Notes (Signed)
[name] for heparin Indication: atrial fibrillation  Allergies  Allergen Reactions  . Sulfa Antibiotics Anaphylaxis    Facial swelling   . Codeine Other (See Comments)    BLACK OUTS  . Doxycycline Nausea Only  . Erythromycin Swelling  . Ibuprofen Other (See Comments)    Stomach pain, muscle spasm, GI bleeding  . Levaquin [Levofloxacin] Other (See Comments)    Insomnia/trouble breathing  . Penicillins Diarrhea    Has patient had a PCN reaction causing immediate rash, facial/tongue/throat swelling, SOB or lightheadedness with hypotension: No Has patient had a PCN reaction causing severe rash involving mucus membranes or skin necrosis: No Has patient had a PCN reaction that required hospitalization Unknown Has patient had a PCN reaction occurring within the last 10 years: No If all of the above answers are "NO", then may proceed with Cephalosporin use.     Patient Measurements: Height: 5' 2"  (157.5 cm) Weight: 191 lb 9.3 oz (86.9 kg) IBW/kg (Calculated) : 50.1 Heparin Dosing Weight: 70 kg  Vital Signs: Temp: 97.4 F (36.3 C) (11/12 1157) Temp Source: Oral (11/12 1157) BP: 106/70 (11/12 1400) Pulse Rate: 98 (11/12 1500)  Labs: Recent Labs    06/21/19 1857 06/22/19 0400 06/22/19 1032 06/22/19 1457 06/23/19 0247 06/23/19 0441 06/23/19 1459  HGB 10.8* 9.6*  --   --   --  8.8*  --   HCT 35.2* 31.3*  --   --   --  28.8*  --   PLT 302 258  --   --   --  221  --   APTT 33  --   --   --   --   --   --   LABPROT 13.9  --   --   --   --   --   --   INR 1.1  --   --   --   --   --   --   HEPARINUNFRC  --   --   --   --  1.44*  --  0.96*  CREATININE 1.56* 1.65*  --   --   --  1.79*  --   TROPONINIHS  --  7 8 7   --   --   --     Estimated Creatinine Clearance: 26.1 mL/min (A) (by C-G formula based on SCr of 1.79 mg/dL (H)).   Assessment: 78 y.o. F on heparin for afib.  Heparin level supratherapeutic at 0.96.  hgb 8.8   Platelets  221   Goal of Therapy:  Heparin level 0.3-0.7 units/ml Monitor platelets by anticoagulation protocol: Yes   Plan:  Decrease heparin drip to 600 units/hr Check 8 hr HL Daily HL and CBC  Margot Ables, PharmD Clinical Pharmacist 06/23/2019 3:49 PM

## 2019-06-23 NOTE — Progress Notes (Signed)
PROGRESS NOTE    Tamara Stein  LDJ:[id number] DOB: 08/29/1940 DOA: 06/21/2019 PCP: Janora Norlander, DO    Brief Narrative:  78 year old female who presented with weakness.  She does have significant past medical history of anxiety, depression, chronic kidney disease stage II, spinal cerebellar degeneration, hypothyroidism and diverticulosis.  She reported weakness, nausea and vague abdominal discomfort.  She had dyspnea on exertion but no chest pain or palpitations.  On her initial physical examination her temperature was 96.9, blood pressure 127/76 and heart rate 102, oxygen saturation 94% on room air.  Heart S1-S2 present, irregular lungs with decreased breath sounds bilaterally, scattered rails, abdomen protuberant, nontender, positive lower extremity edema.  Urinalysis had specific gravity 1.015, more than 50 white cells.  Chest x-ray with no infiltrates.  EKG 104 bpm, normal axis, atrial fibrillation rhythm, no ST segment or T wave changes.  Patient was admitted to the hospital with working diagnosis of new onset atrial fibrillation complicated by urine tract infection   Assessment & Plan:   Principal Problem:   Atrial fibrillation with RVR ([facility]) Active Problems:   GERD (gastroesophageal reflux disease)   Essential hypertension with goal blood pressure less than 140/90   Hyperlipidemia   Cerebellar degeneration ([facility])   Depression   Hypothyroid   CKD (chronic kidney disease) stage 3, GFR 30-59 ml/min ([place])   1. New onset atrial fibrillation with RVR. Patient continue uncontrolled RVR this am, blood pressure 030 to 092 systolic, recent echocardiogram from last month with preserved LV systolic function (no need to repeat study).  She is currently on Cardizem infusion.  Will start on oral Cardizem in the hopes to wean her off infusion. Continue telemetry monitoring. Anticoagulation for now with heparin. Chads VAsc score is 4.   2. Urinary tract infection with sepsis, (end  organ damage AKI)./ present on admission.  Urine culture positive for gram-negative rods.  Continue on antibiotics..   3. AKI on CKD stage 3a with hypokalemia  Renal function with cr at 1.65 with K at 3.3 and serum bicarbonate at 24.  Creatinine continues to trend up today.  Will check renal ultrasound.  Avoid hypotension and nephrotoxic medications.  Provide gentle hydration follow-up labs in a.m.  4. HTN with dyslipidemia. Continue blood pressure monitoring will continue to hold amlodipine, lisinopril and hydralazine to avoid hypotension. Continue with statin therapy.   5. Hypothyroid. Continue with levothyroxine.   6. Chronic anemia. Multifactorial continue to follow cell count.   7. Spinocerebellar degeneration. Ambulatory dysfunction, seen by physical therapy with recommendations for home health therapy.    DVT prophylaxis: heparin    Code Status:  dnr  Family Communication:  No family at the bedside  Disposition Plan/ discharge barriers: pending clinical improvement.   Body mass index is 35.04 kg/m. Malnutrition Type:      Malnutrition Characteristics:      Nutrition Interventions:     RN Pressure Injury Documentation:     Consultants:     Procedures:     Antimicrobials:   Ceftriaxone 11/11 >   Subjective: Continues have generalized weakness, no chest pain.  Describes significant orthopnea.  Objective: Vitals:   06/23/19 1700 06/23/19 1730 06/23/19 1800 06/23/19 1830  BP: 105/77 121/66 97/78 94/75   Pulse:   98 99  Resp: 18 (!) 22 20 (!) 23  Temp:      TempSrc:      SpO2: 95% 95% 92% 96%  Weight:      Height:  Intake/Output Summary (Last 24 hours) at 06/23/2019 1955 Last data filed at 06/23/2019 1500 Gross per 24 hour  Intake 527.29 ml  Output -  Net 527.29 ml   Filed Weights   06/21/19 1842 06/22/19 1622  Weight: 92.8 kg 86.9 kg    Examination:   General exam: Alert, awake, oriented x 3 Respiratory system: Clear to  auscultation. Respiratory effort normal. Cardiovascular system: Irregular. No murmurs, rubs, gallops. Gastrointestinal system: Abdomen is nondistended, soft and nontender. No organomegaly or masses felt. Normal bowel sounds heard. Central nervous system: Speech is difficult to comprehend due to dysarthria.  No gross focal deficits noted.  Exam limited. Extremities: No C/C/E, +pedal pulses Skin: No rashes, lesions or ulcers Psychiatry: Judgement and insight appear normal. Mood & affect appropriate.   Data Reviewed: I have personally reviewed following labs and imaging studies  CBC: Recent Labs  Lab 06/21/19 1857 06/22/19 0400 06/23/19 0441  WBC 13.7* 18.6* 10.2  NEUTROABS 12.6*  --  7.9*  HGB 10.8* 9.6* 8.8*  HCT 35.2* 31.3* 28.8*  MCV 97.8 98.1 98.3  PLT 302 258 297   Basic Metabolic Panel: Recent Labs  Lab 06/21/19 1857 06/22/19 0400 06/23/19 0441  NA 138 141 141  K 3.1* 3.3* 3.2*  CL 103 104 104  CO2 22 24 22   GLUCOSE 140* 141* 119*  BUN 28* 31* 36*  CREATININE 1.56* 1.65* 1.79*  CALCIUM 8.3* 8.1* 7.8*   GFR: Estimated Creatinine Clearance: 26.1 mL/min (A) (by C-G formula based on SCr of 1.79 mg/dL (H)). Liver Function Tests: Recent Labs  Lab 06/21/19 1857  AST 28  ALT 22  ALKPHOS 68  BILITOT 0.9  PROT 6.2*  ALBUMIN 3.1*   No results for input(s): LIPASE, AMYLASE in the last 168 hours. No results for input(s): AMMONIA in the last 168 hours. Coagulation Profile: Recent Labs  Lab 06/21/19 1857  INR 1.1   Cardiac Enzymes: No results for input(s): CKTOTAL, CKMB, CKMBINDEX, TROPONINI in the last 168 hours. BNP (last 3 results) No results for input(s): PROBNP in the last 8760 hours. HbA1C: No results for input(s): HGBA1C in the last 72 hours. CBG: Recent Labs  Lab 06/21/19 1839  GLUCAP 137*   Lipid Profile: Recent Labs    06/21/19 1858  TRIG 119   Thyroid Function Tests: Recent Labs    06/22/19 0710  TSH 4.733*   Anemia Panel: Recent  Labs    06/21/19 1858  FERRITIN 36      Radiology Studies: I have reviewed all of the imaging during this hospital visit personally     Scheduled Meds: . aspirin EC  81 mg Oral Q breakfast  . atorvastatin  40 mg Oral QHS  . Chlorhexidine Gluconate Cloth  6 each Topical Daily  . diltiazem  60 mg Oral Q6H  . DULoxetine  60 mg Oral q morning - 10a  . famotidine  10 mg Oral Daily  . levothyroxine  50 mcg Oral QAC breakfast  . loratadine  10 mg Oral Daily  . multivitamin with minerals  1 tablet Oral Daily  . pantoprazole (PROTONIX) IV  40 mg Intravenous Q24H  . sodium chloride flush  3 mL Intravenous Q12H   Continuous Infusions: . sodium chloride    . cefTRIAXone (ROCEPHIN)  IV 1 g (06/22/19 2135)  . diltiazem (CARDIZEM) infusion 10 mg/hr (06/23/19 1935)  . heparin 600 Units/hr (06/23/19 1729)  . lactated ringers 75 mL/hr at 06/23/19 1728     LOS: 2 days  Kathie Dike, MD

## 2019-06-23 NOTE — Plan of Care (Signed)
  Problem: Acute Rehab PT Goals(only PT should resolve) Goal: Pt will Roll Supine to Side Outcome: Progressing Flowsheets (Taken 06/23/2019 0930) Pt will Roll Supine to Side: with modified independence Goal: Pt Will Go Supine/Side To Sit Outcome: Progressing Flowsheets (Taken 06/23/2019 0930) Pt will go Supine/Side to Sit: with modified independence Goal: Patient Will Perform Sitting Balance Outcome: Progressing Flowsheets (Taken 06/23/2019 0930) Patient will perform sitting balance: with modified independence   9:31 AM, 06/23/19 Lonell Grandchild, MPT Physical Therapist with [facility] [phone] office [phone] mobile phone

## 2019-06-23 NOTE — Evaluation (Signed)
Physical Therapy Evaluation Patient Details Name: Tamara Stein MRN: [id number] DOB: Sep 02, 1940 Today's Date: 06/23/2019   History of Present Illness  HPI: Tamara Stein is a 78 y.o. female with medical history significant of anxiety, depression, pretension, chronic kidney disease stage II, spinal cerebellar degeneration, hypothyroidism and diverticulitis who presented to the ER with weakness, nausea, vague abdominal discomfort.  Patient reports she has been having progressive weakness over the last few days and has been feeling unwell.  Today she started having nausea and some lower abdominal discomfort.  No vomiting, diarrhea reported.  She was also complaining of some shortness of breath mainly with exertion.  EMS was called with above-mentioned complaint.  She was noted to be tachycardic and tachypneic with mild hypoxemia.  Review of records shows patient was recently admitted in October initially for colitis and then subsequently for healthcare associated pneumonia.  Has a history of recurrent UTIs in the past.  Did not have any fever, chills at home.  Noted to be hypothermic on arrival.  Was also noted to be in new onset atrial fibrillation with RVR and received Cardizem IV in the ER.    Clinical Impression  Patient functioning near baseline for functional mobility.  Patient is nonambulatory but usually able to scoot over for by herself for bed<>scooter<>commode transfers using rails, presently patient has difficulty sitting up at bedside and maintain sitting balance due weakness and required Max stand pivot to transfer to chair with mechanical lift harness in place.  Nursing staff advised to use mechanical lift for transfers while in hospital, patient requires use of her scooter at home with her home set up to transfer without using mechanical lift.  Patient tolerated sitting up in chair after therapy - nursing staff notified.  Patient will benefit from continued physical therapy in hospital  and recommended venue below to increase strength, balance, endurance for safe ADLs and gait.    Follow Up Recommendations Home health PT;Supervision for mobility/OOB;Supervision/Assistance - 24 hour    Equipment Recommendations  None recommended by PT    Recommendations for Other Services       Precautions / Restrictions Precautions Precautions: Fall Restrictions Weight Bearing Restrictions: No      Mobility  Bed Mobility Overal bed mobility: Needs Assistance Bed Mobility: Supine to Sit     Supine to sit: Min assist;Mod assist     General bed mobility comments: slow labored movement with difficulty maintaining sitting balance  Transfers Overall transfer level: Needs assistance Equipment used: 1 person hand held assist Transfers: Sit to/from Stand;Stand Pivot Transfers Sit to Stand: Max assist Stand pivot transfers: Max assist       General transfer comment: unable to take steps due to weakness, required Max stand pivot to transfer to chair  Ambulation/Gait                Stairs            Wheelchair Mobility    Modified Rankin (Stroke Patients Only)       Balance Overall balance assessment: Needs assistance Sitting-balance support: Feet unsupported;Bilateral upper extremity supported Sitting balance-Leahy Scale: Fair Sitting balance - Comments: fair/poor possibly due to air mattress   Standing balance support: During functional activity;No upper extremity supported Standing balance-Leahy Scale: Poor Standing balance comment: hand held assist                             Pertinent Vitals/Pain Pain Assessment: No/denies pain  Home Living Family/patient expects to be discharged to:: Private residence Living Arrangements: Other relatives Available Help at Discharge: Family;Available 24 hours/day;Personal care attendant Type of Home: House Home Access: Ramped entrance     Home Layout: One level Home Equipment: Engineer, technical sales - 2 wheels;Tub bench;Grab bars - toilet;Grab bars - tub/shower      Prior Function Level of Independence: Needs assistance   Gait / Transfers Assistance Needed: non ambulatory, Mod Indep for bed<>electric scooter<>commode transfers using rails scooting over  ADL's / Homemaking Assistance Needed: assisted for showers and household ADLs, has caregivers 3 days/week  Comments: Mod Independent with scoot transfers from [facility] to toilet and bed.  Has rails at toilet     Hand Dominance   Dominant Hand: Right    Extremity/Trunk Assessment   Upper Extremity Assessment Upper Extremity Assessment: Defer to OT evaluation    Lower Extremity Assessment Lower Extremity Assessment: Generalized weakness    Cervical / Trunk Assessment Cervical / Trunk Assessment: Normal  Communication   Communication: No difficulties  Cognition Arousal/Alertness: Awake/alert Behavior During Therapy: WFL for tasks assessed/performed Overall Cognitive Status: Within Functional Limits for tasks assessed                                        General Comments      Exercises     Assessment/Plan    PT Assessment Patient needs continued PT services  PT Problem List Decreased strength;Decreased activity tolerance;Decreased balance;Decreased mobility       PT Treatment Interventions Functional mobility training;Therapeutic activities;Therapeutic exercise;Patient/family education;Wheelchair mobility training;Balance training    PT Goals (Current goals can be found in the Care Plan section)  Acute Rehab PT Goals Patient Stated Goal: return home with family and home aides to assist PT Goal Formulation: With patient Time For Goal Achievement: 06/30/19 Potential to Achieve Goals: Good    Frequency Min 2X/week   Barriers to discharge        Co-evaluation PT/OT/SLP Co-Evaluation/Treatment: Yes Reason for Co-Treatment: For patient/therapist safety;To address functional/ADL  transfers PT goals addressed during session: Mobility/safety with mobility;Balance;Proper use of DME         AM-PAC PT "6 Clicks" Mobility  Outcome Measure Help needed turning from your back to your side while in a flat bed without using bedrails?: A Little Help needed moving from lying on your back to sitting on the side of a flat bed without using bedrails?: A Lot Help needed moving to and from a bed to a chair (including a wheelchair)?: A Lot Help needed standing up from a chair using your arms (e.g., wheelchair or bedside chair)?: A Lot Help needed to walk in hospital room?: Total Help needed climbing 3-5 steps with a railing? : Total 6 Click Score: 11    End of Session Equipment Utilized During Treatment: Oxygen Activity Tolerance: Patient tolerated treatment well;Patient limited by fatigue Patient left: in chair;with call bell/phone within reach Nurse Communication: Mobility status PT Visit Diagnosis: Unsteadiness on feet (R26.81);Other abnormalities of gait and mobility (R26.89);Muscle weakness (generalized) (M62.81)    Time: 1607-3710 PT Time Calculation (min) (ACUTE ONLY): 32 min   Charges:   PT Evaluation $PT Eval Moderate Complexity: 1 Mod PT Treatments $Therapeutic Activity: 23-37 mins        9:28 AM, 06/23/19 Lonell Grandchild, MPT Physical Therapist with [facility] [phone] office [phone] mobile phone

## 2019-06-23 NOTE — Evaluation (Signed)
Occupational Therapy Evaluation Patient Details Name: Tamara Stein MRN: [id number] DOB: 1940-10-29 Today's Date: 06/23/2019    History of Present Illness HPI: Tamara Stein is a 78 y.o. female with medical history significant of anxiety, depression, pretension, chronic kidney disease stage II, spinal cerebellar degeneration, hypothyroidism and diverticulitis who presented to the ER with weakness, nausea, vague abdominal discomfort.  Patient reports she has been having progressive weakness over the last few days and has been feeling unwell.  Today she started having nausea and some lower abdominal discomfort.  No vomiting, diarrhea reported.  She was also complaining of some shortness of breath mainly with exertion.  EMS was called with above-mentioned complaint.  She was noted to be tachycardic and tachypneic with mild hypoxemia.  Review of records shows patient was recently admitted in October initially for colitis and then subsequently for healthcare associated pneumonia.  Has a history of recurrent UTIs in the past.  Did not have any fever, chills at home.  Noted to be hypothermic on arrival.  Was also noted to be in new onset atrial fibrillation with RVR and received Cardizem IV in the ER.   Clinical Impression   Pt agreeable to PT/OT evaluation this am. Pt is near baseline for ADL and mobility tasks. Pt is non-ambulatory, slides across bed to hoverround or commode when at home. Pt requires assist with all ADLs, has aide to assist with bathing 3x/week and also has [place] services. Recommend HHOT services on discharge to improve pt safety and independence during ADL completion.     Follow Up Recommendations  Home health OT    Equipment Recommendations  None recommended by OT       Precautions / Restrictions Precautions Precautions: Fall Restrictions Weight Bearing Restrictions: No      Mobility Bed Mobility Overal bed mobility: Needs Assistance Bed Mobility: Supine to Sit      Supine to sit: Min assist;Mod assist     General bed mobility comments: Defer to PT note  Transfers Overall transfer level: Needs assistance Equipment used: 1 person hand held assist Transfers: Sit to/from [employer] Transfers Sit to Stand: Max assist Stand pivot transfers: Max assist       General transfer comment: Defer to PT note        ADL either performed or assessed with clinical judgement   ADL Overall ADL's : Needs assistance/impaired Eating/Feeding: Set up;Sitting Eating/Feeding Details (indicate cue type and reason): OT assisting with cutting food and opening packages Grooming: Wash/dry hands;Set up;Sitting               Lower Body Dressing: Maximal assistance;Bed level                 General ADL Comments: Pt requires high level of assistance at baseline. Requiring increased level of assistance due to home set up being different than hospital     Vision Baseline Vision/History: No visual deficits Patient Visual Report: No change from baseline Vision Assessment?: No apparent visual deficits            Pertinent Vitals/Pain Pain Assessment: No/denies pain     Hand Dominance Right   Extremity/Trunk Assessment Upper Extremity Assessment Upper Extremity Assessment: Generalized weakness   Lower Extremity Assessment Lower Extremity Assessment: Defer to PT evaluation   Cervical / Trunk Assessment Cervical / Trunk Assessment: Normal   Communication Communication Communication: No difficulties   Cognition Arousal/Alertness: Awake/alert Behavior During Therapy: WFL for tasks assessed/performed Overall Cognitive Status: Within Functional Limits for tasks assessed  Home Living Family/patient expects to be discharged to:: Private residence Living Arrangements: Other relatives Available Help at Discharge: Family;Available 24 hours/day;Personal care attendant Type of  Home: House Home Access: Ramped entrance     Home Layout: One level     Bathroom Shower/Tub: Occupational psychologist: Handicapped height Bathroom Accessibility: Yes   Home Equipment: Youth worker - 2 wheels;Tub bench;Grab bars - toilet;Grab bars - tub/shower          Prior Functioning/Environment Level of Independence: Needs assistance  Gait / Transfers Assistance Needed: non ambulatory, Mod Indep for bed<>electric scooter<>commode transfers using rails scooting over ADL's / Homemaking Assistance Needed: assisted for showers and household ADLs, has caregivers 3 days/week; reports [place] services as well   Comments: Mod Independent with scoot transfers from [facility] to toilet and bed.  Has rails at toilet        OT Problem List: Decreased strength;Decreased activity tolerance;Impaired balance (sitting and/or standing)      OT Treatment/Interventions:      OT Goals(Current goals can be found in the care plan section) Acute Rehab OT Goals Patient Stated Goal: return home with family and home aides to assist  OT Frequency:             Co-evaluation PT/OT/SLP Co-Evaluation/Treatment: Yes Reason for Co-Treatment: For patient/therapist safety;To address functional/ADL transfers;Complexity of the patient's impairments (multi-system involvement) PT goals addressed during session: Mobility/safety with mobility;Balance;Proper use of DME OT goals addressed during session: ADL's and self-care         End of Session Equipment Utilized During Treatment: Oxygen Nurse Communication: Other (comment)(IV out)  Activity Tolerance: Patient tolerated treatment well Patient left: in chair;with call bell/phone within reach  OT Visit Diagnosis: Muscle weakness (generalized) (M62.81)                Time: 5726-2035 OT Time Calculation (min): 33 min Charges:  OT General Charges $OT Visit: 1 Visit OT Evaluation $OT Eval Moderate Complexity: [place], OTR/L   [phone] 06/23/2019, 11:50 AM

## 2019-06-23 NOTE — Progress Notes (Signed)
[place] for heparin Indication: atrial fibrillation  Allergies  Allergen Reactions  . Sulfa Antibiotics Anaphylaxis    Facial swelling   . Codeine Other (See Comments)    BLACK OUTS  . Doxycycline Nausea Only  . Erythromycin Swelling  . Ibuprofen Other (See Comments)    Stomach pain, muscle spasm, GI bleeding  . Levaquin [Levofloxacin] Other (See Comments)    Insomnia/trouble breathing  . Penicillins Diarrhea    Has patient had a PCN reaction causing immediate rash, facial/tongue/throat swelling, SOB or lightheadedness with hypotension: No Has patient had a PCN reaction causing severe rash involving mucus membranes or skin necrosis: No Has patient had a PCN reaction that required hospitalization Unknown Has patient had a PCN reaction occurring within the last 10 years: No If all of the above answers are "NO", then may proceed with Cephalosporin use.     Patient Measurements: Height: 5' 2"  (157.5 cm) Weight: 191 lb 9.3 oz (86.9 kg) IBW/kg (Calculated) : 50.1 Heparin Dosing Weight: 70 kg  Vital Signs: BP: 94/62 (11/12 0300) Pulse Rate: 104 (11/12 0300)  Labs: Recent Labs    06/21/19 1857 06/22/19 0400 06/22/19 1032 06/22/19 1457 06/23/19 0247  HGB 10.8* 9.6*  --   --   --   HCT 35.2* 31.3*  --   --   --   PLT 302 258  --   --   --   APTT 33  --   --   --   --   LABPROT 13.9  --   --   --   --   INR 1.1  --   --   --   --   HEPARINUNFRC  --   --   --   --  1.44*  CREATININE 1.56* 1.65*  --   --   --   TROPONINIHS  --  7 8 7   --     Estimated Creatinine Clearance: 28.3 mL/min (A) (by C-G formula based on SCr of 1.65 mg/dL (H)).   Assessment: 78 y.o. F on heparin for afib.  Heparin level supratherapeutic (1.44) on gtt at 1000 units/hr. RN confirmed with lab that level was drawn in arm opposite where heparin was infusing. No bleeding noted.  Goal of Therapy:  Heparin level 0.3-0.7 units/ml Monitor platelets by  anticoagulation protocol: Yes   Plan:  Hold heparin x 1 hour Restart heparin drip at 750 units/hr Check 8 hr HL Daily HL and CBC  Sherlon Handing, PharmD, BCPS CGV Clinical pharmacist phone [phone] 06/23/2019 4:50 AM

## 2019-06-24 ENCOUNTER — Inpatient Hospital Stay (HOSPITAL_COMMUNITY): Payer: Medicare Other

## 2019-06-24 ENCOUNTER — Encounter (HOSPITAL_COMMUNITY): Payer: Self-pay | Admitting: Radiology

## 2019-06-24 LAB — BASIC METABOLIC PANEL
Anion gap: 12 (ref 5–15)
BUN: 40 mg/dL — ABNORMAL HIGH (ref 8–23)
CO2: 21 mmol/L — ABNORMAL LOW (ref 22–32)
Calcium: 7.6 mg/dL — ABNORMAL LOW (ref 8.9–10.3)
Chloride: 106 mmol/L (ref 98–111)
Creatinine, Ser: 2.12 mg/dL — ABNORMAL HIGH (ref 0.44–1.00)
GFR calc Af Amer: 25 mL/min — ABNORMAL LOW (ref >60)
GFR calc non Af Amer: 22 mL/min — ABNORMAL LOW (ref >60)
Glucose, Bld: 125 mg/dL — ABNORMAL HIGH (ref 70–99)
Potassium: 3.8 mmol/L (ref 3.5–5.1)
Sodium: 139 mmol/L (ref 135–145)

## 2019-06-24 LAB — MAGNESIUM: Magnesium: 1.6 mg/dL — ABNORMAL LOW (ref 1.7–2.4)

## 2019-06-24 LAB — CBC
HCT: 29.7 % — ABNORMAL LOW (ref 36.0–46.0)
Hemoglobin: 9.1 g/dL — ABNORMAL LOW (ref 12.0–15.0)
MCH: 29.4 pg (ref 26.0–34.0)
MCHC: 30.6 g/dL (ref 30.0–36.0)
MCV: 96.1 fL (ref 80.0–100.0)
Platelets: 219 10*3/uL (ref 150–400)
RBC: 3.09 MIL/uL — ABNORMAL LOW (ref 3.87–5.11)
RDW: 15.6 % — ABNORMAL HIGH (ref 11.5–15.5)
WBC: 6.8 10*3/uL (ref 4.0–10.5)
nRBC: 0.3 % — ABNORMAL HIGH (ref 0.0–0.2)

## 2019-06-24 LAB — HEPARIN LEVEL (UNFRACTIONATED)
Heparin Unfractionated: 0.6 IU/mL (ref 0.30–0.70)
Heparin Unfractionated: 0.72 IU/mL — ABNORMAL HIGH (ref 0.30–0.70)

## 2019-06-24 MED ORDER — LACTATED RINGERS IV SOLN
INTRAVENOUS | Status: AC
  Administered 2019-06-24: 22:00:00 via INTRAVENOUS

## 2019-06-24 NOTE — Progress Notes (Signed)
Patient converted to NSR approximately 1245 on Cardizem gtt at 5. Heart rate between 60 and 70. MD made aware. Orders to discontinue Cardizem gtt.

## 2019-06-24 NOTE — Progress Notes (Signed)
PROGRESS NOTE    Tamara Stein  PJK:[id number] DOB: 19-Jan-1941 DOA: 06/21/2019 PCP: Janora Norlander, DO    Brief Narrative:  78 year old female who presented with weakness.  She does have significant past medical history of anxiety, depression, chronic kidney disease stage II, spinal cerebellar degeneration, hypothyroidism and diverticulosis.  She reported weakness, nausea and vague abdominal discomfort.  She had dyspnea on exertion but no chest pain or palpitations.  On her initial physical examination her temperature was 96.9, blood pressure 127/76 and heart rate 102, oxygen saturation 94% on room air.  Heart S1-S2 present, irregular lungs with decreased breath sounds bilaterally, scattered rails, abdomen protuberant, nontender, positive lower extremity edema.  Urinalysis had specific gravity 1.015, more than 50 white cells.  Chest x-ray with no infiltrates.  EKG 104 bpm, normal axis, atrial fibrillation rhythm, no ST segment or T wave changes.  Patient was admitted to the hospital with working diagnosis of new onset atrial fibrillation complicated by urine tract infection   Assessment & Plan:   Principal Problem:   Atrial fibrillation with RVR (HCC) Active Problems:   GERD (gastroesophageal reflux disease)   Essential hypertension with goal blood pressure less than 140/90   Hyperlipidemia   Cerebellar degeneration (HCC)   Depression   Hypothyroid   CKD (chronic kidney disease) stage 3, GFR 30-59 ml/min ([place])   1. New onset atrial fibrillation with RVR. Recent echocardiogram from last month with preserved LV systolic function (no need to repeat study).  She was treated with Cardizem infusion but has since converted back to sinus rhythm.  Currently on Cardizem p.o.. Continue telemetry monitoring. Anticoagulation for now with heparin. Chads VAsc score is 4.   2. Urinary tract infection with sepsis, (end organ damage AKI)./ present on admission.  Urine culture positive for  gram-negative rods.  Continue on antibiotics.   3. AKI on CKD stage 3a with hypokalemia.  Creatinine continues to trend up.  Reviewed with nephrology and it was felt that her episodes of hypotension were likely causing some degree of ATN.  Once blood pressure stabilized, it is anticipated that her renal function should improve.  Continue IV hydration and monitor urine output.  Renal ultrasound does not show any evidence of hydronephrosis.    4. HTN with dyslipidemia. Continue blood pressure monitoring will continue to hold amlodipine, lisinopril and hydralazine to avoid hypotension. Continue with statin therapy.   5. Hypothyroid. Continue with levothyroxine.   6. Chronic anemia. Multifactorial continue to follow cell count.   7. Spinocerebellar degeneration. Ambulatory dysfunction, seen by physical therapy with recommendations for home health therapy.    DVT prophylaxis: heparin    Code Status:  dnr  Family Communication:  No family at the bedside  Disposition Plan/ discharge barriers: pending clinical improvement.   Body mass index is 36.09 kg/m. Malnutrition Type:      Malnutrition Characteristics:      Nutrition Interventions:     RN Pressure Injury Documentation:     Consultants:     Procedures:     Antimicrobials:   Ceftriaxone 11/11 >   Subjective: Low urine output noted overnight.  She does not have any chest pain or cough.    Objective: Vitals:   06/24/19 1153 06/24/19 1256 06/24/19 1639 06/24/19 1700  BP:  110/60  (!) 112/58  Pulse:  73  84  Resp:  17  (!) 21  Temp: (!) 97.5 F (36.4 C)  97.9 F (36.6 C)   TempSrc: Oral  Oral   SpO2:  100%  98%  Weight:      Height:        Intake/Output Summary (Last 24 hours) at 06/24/2019 2009 Last data filed at 06/24/2019 0300 Gross per 24 hour  Intake 883.14 ml  Output -  Net 883.14 ml   Filed Weights   06/21/19 1842 06/22/19 1622 06/24/19 0500  Weight: 92.8 kg 86.9 kg 89.5 kg     Examination:   General exam: Alert, awake, oriented x 3 Respiratory system: Clear to auscultation.  Mildly increased respiratory effort. Cardiovascular system:RRR. No murmurs, rubs, gallops. Gastrointestinal system: Abdomen is nondistended, soft and nontender. No organomegaly or masses felt. Normal bowel sounds heard. Central nervous system: Alert and oriented. No focal neurological deficits. Extremities: 1+ edema bilaterally Skin: No rashes, lesions or ulcers Psychiatry: Judgement and insight appear normal. Mood & affect appropriate.    Data Reviewed: I have personally reviewed following labs and imaging studies  CBC: Recent Labs  Lab 06/21/19 1857 06/22/19 0400 06/23/19 0441 06/24/19 0405  WBC 13.7* 18.6* 10.2 6.8  NEUTROABS 12.6*  --  7.9*  --   HGB 10.8* 9.6* 8.8* 9.1*  HCT 35.2* 31.3* 28.8* 29.7*  MCV 97.8 98.1 98.3 96.1  PLT 302 258 221 009   Basic Metabolic Panel: Recent Labs  Lab 06/21/19 1857 06/22/19 0400 06/23/19 0441 06/24/19 0405  NA 138 141 141 139  K 3.1* 3.3* 3.2* 3.8  CL 103 104 104 106  CO2 22 24 22  21*  GLUCOSE 140* 141* 119* 125*  BUN 28* 31* 36* 40*  CREATININE 1.56* 1.65* 1.79* 2.12*  CALCIUM 8.3* 8.1* 7.8* 7.6*  MG  --   --   --  1.6*   GFR: Estimated Creatinine Clearance: 22.4 mL/min (A) (by C-G formula based on SCr of 2.12 mg/dL (H)). Liver Function Tests: Recent Labs  Lab 06/21/19 1857  AST 28  ALT 22  ALKPHOS 68  BILITOT 0.9  PROT 6.2*  ALBUMIN 3.1*   No results for input(s): LIPASE, AMYLASE in the last 168 hours. No results for input(s): AMMONIA in the last 168 hours. Coagulation Profile: Recent Labs  Lab 06/21/19 1857  INR 1.1   Cardiac Enzymes: No results for input(s): CKTOTAL, CKMB, CKMBINDEX, TROPONINI in the last 168 hours. BNP (last 3 results) No results for input(s): PROBNP in the last 8760 hours. HbA1C: No results for input(s): HGBA1C in the last 72 hours. CBG: Recent Labs  Lab 06/21/19 1839  GLUCAP 137*    Lipid Profile: No results for input(s): CHOL, HDL, LDLCALC, TRIG, CHOLHDL, LDLDIRECT in the last 72 hours. Thyroid Function Tests: Recent Labs    06/22/19 0710  TSH 4.733*   Anemia Panel: No results for input(s): VITAMINB12, FOLATE, FERRITIN, TIBC, IRON, RETICCTPCT in the last 72 hours.    Radiology Studies: I have reviewed all of the imaging during this hospital visit personally     Scheduled Meds: . aspirin EC  81 mg Oral Q breakfast  . atorvastatin  40 mg Oral QHS  . Chlorhexidine Gluconate Cloth  6 each Topical Daily  . diltiazem  60 mg Oral Q6H  . DULoxetine  60 mg Oral q morning - 10a  . famotidine  10 mg Oral Daily  . levothyroxine  50 mcg Oral QAC breakfast  . loratadine  10 mg Oral Daily  . multivitamin with minerals  1 tablet Oral Daily  . pantoprazole (PROTONIX) IV  40 mg Intravenous Q24H  . sodium chloride flush  3 mL Intravenous Q12H   Continuous  Infusions: . sodium chloride    . cefTRIAXone (ROCEPHIN)  IV 1 g (06/23/19 2016)  . heparin 550 Units/hr (06/24/19 0108)  . lactated ringers       LOS: 3 days        Kathie Dike, MD

## 2019-06-24 NOTE — Progress Notes (Signed)
[place] for heparin Indication: atrial fibrillation  Allergies  Allergen Reactions  . Sulfa Antibiotics Anaphylaxis    Facial swelling   . Codeine Other (See Comments)    BLACK OUTS  . Doxycycline Nausea Only  . Erythromycin Swelling  . Ibuprofen Other (See Comments)    Stomach pain, muscle spasm, GI bleeding  . Levaquin [Levofloxacin] Other (See Comments)    Insomnia/trouble breathing  . Penicillins Diarrhea    Has patient had a PCN reaction causing immediate rash, facial/tongue/throat swelling, SOB or lightheadedness with hypotension: No Has patient had a PCN reaction causing severe rash involving mucus membranes or skin necrosis: No Has patient had a PCN reaction that required hospitalization Unknown Has patient had a PCN reaction occurring within the last 10 years: No If all of the above answers are "NO", then may proceed with Cephalosporin use.     Patient Measurements: Height: 5' 2"  (157.5 cm) Weight: 197 lb 5 oz (89.5 kg) IBW/kg (Calculated) : 50.1 Heparin Dosing Weight: 70 kg  Vital Signs: Temp: 97.5 F (36.4 C) (11/13 0753) Temp Source: Oral (11/13 0753) BP: 94/70 (11/13 0930) Pulse Rate: 86 (11/13 0930)  Labs: Recent Labs    06/21/19 1857 06/22/19 0400 06/22/19 1032 06/22/19 1457  06/23/19 0441 06/23/19 1459 06/23/19 2349 06/24/19 0405 06/24/19 1025  HGB 10.8* 9.6*  --   --   --  8.8*  --   --  9.1*  --   HCT 35.2* 31.3*  --   --   --  28.8*  --   --  29.7*  --   PLT 302 258  --   --   --  221  --   --  219  --   APTT 33  --   --   --   --   --   --   --   --   --   LABPROT 13.9  --   --   --   --   --   --   --   --   --   INR 1.1  --   --   --   --   --   --   --   --   --   HEPARINUNFRC  --   --   --   --    < >  --  0.96* 0.72*  --  0.60  CREATININE 1.56* 1.65*  --   --   --  1.79*  --   --  2.12*  --   TROPONINIHS  --  7 8 7   --   --   --   --   --   --    < > = values in this interval not displayed.     Estimated Creatinine Clearance: 22.4 mL/min (A) (by C-G formula based on SCr of 2.12 mg/dL (H)).   Assessment: 78 y.o. F on heparin for afib.   Heparin level is therapeutic (0.60) on gtt at 550 units/hr. No bleeding noted.  Goal of Therapy:  Heparin level 0.3-0.7 units/ml Monitor platelets by anticoagulation protocol: Yes   Plan:  Continue heparin drip at 550 units/hr Check  HL daily Monitor for S/S of bleeding  Isac Sarna, BS [place]ena [place], BCPS Clinical Pharmacist Pager [phone] 06/24/2019 10:52 AM

## 2019-06-24 NOTE — Progress Notes (Signed)
[place] for heparin Indication: atrial fibrillation  Allergies  Allergen Reactions  . Sulfa Antibiotics Anaphylaxis    Facial swelling   . Codeine Other (See Comments)    BLACK OUTS  . Doxycycline Nausea Only  . Erythromycin Swelling  . Ibuprofen Other (See Comments)    Stomach pain, muscle spasm, GI bleeding  . Levaquin [Levofloxacin] Other (See Comments)    Insomnia/trouble breathing  . Penicillins Diarrhea    Has patient had a PCN reaction causing immediate rash, facial/tongue/throat swelling, SOB or lightheadedness with hypotension: No Has patient had a PCN reaction causing severe rash involving mucus membranes or skin necrosis: No Has patient had a PCN reaction that required hospitalization Unknown Has patient had a PCN reaction occurring within the last 10 years: No If all of the above answers are "NO", then may proceed with Cephalosporin use.     Patient Measurements: Height: 5' 2"  (157.5 cm) Weight: 191 lb 9.3 oz (86.9 kg) IBW/kg (Calculated) : 50.1 Heparin Dosing Weight: 70 kg  Vital Signs: Temp: 97.7 F (36.5 C) (11/12 1617) Temp Source: Oral (11/12 1617) BP: 107/59 (11/13 0030) Pulse Rate: 65 (11/13 0030)  Labs: Recent Labs    06/21/19 1857 06/22/19 0400 06/22/19 1032 06/22/19 1457 06/23/19 0247 06/23/19 0441 06/23/19 1459 06/23/19 2349  HGB 10.8* 9.6*  --   --   --  8.8*  --   --   HCT 35.2* 31.3*  --   --   --  28.8*  --   --   PLT 302 258  --   --   --  221  --   --   APTT 33  --   --   --   --   --   --   --   LABPROT 13.9  --   --   --   --   --   --   --   INR 1.1  --   --   --   --   --   --   --   HEPARINUNFRC  --   --   --   --  1.44*  --  0.96* 0.72*  CREATININE 1.56* 1.65*  --   --   --  1.79*  --   --   TROPONINIHS  --  7 8 7   --   --   --   --     Estimated Creatinine Clearance: 26.1 mL/min (A) (by C-G formula based on SCr of 1.79 mg/dL (H)).   Assessment: 78 y.o. F on heparin for afib.    Heparin level remains just slightly supratherapeutic (0.72) on gtt at 600 units/hr. No bleeding noted.  Goal of Therapy:  Heparin level 0.3-0.7 units/ml Monitor platelets by anticoagulation protocol: Yes   Plan:  Decrease heparin drip to 550 units/hr Check 8 hr HL  Sherlon Handing, PharmD, BCPS CGV Clinical pharmacist phone [phone] 06/24/2019 12:56 AM

## 2019-06-24 NOTE — Progress Notes (Signed)
Patient with minimal output during this shift, approximately 100 mL. Bladder scanned performed showed 228 mL in bladder. Patient denies urge to void. MD made aware.

## 2019-06-25 LAB — BASIC METABOLIC PANEL
Anion gap: 11 (ref 5–15)
BUN: 35 mg/dL — ABNORMAL HIGH (ref 8–23)
CO2: 24 mmol/L (ref 22–32)
Calcium: 8 mg/dL — ABNORMAL LOW (ref 8.9–10.3)
Chloride: 106 mmol/L (ref 98–111)
Creatinine, Ser: 1.59 mg/dL — ABNORMAL HIGH (ref 0.44–1.00)
GFR calc Af Amer: 35 mL/min — ABNORMAL LOW (ref >60)
GFR calc non Af Amer: 31 mL/min — ABNORMAL LOW (ref >60)
Glucose, Bld: 136 mg/dL — ABNORMAL HIGH (ref 70–99)
Potassium: 3.7 mmol/L (ref 3.5–5.1)
Sodium: 141 mmol/L (ref 135–145)

## 2019-06-25 LAB — URINE CULTURE: Culture: >=100000 — AB

## 2019-06-25 LAB — CBC
HCT: 30.6 % — ABNORMAL LOW (ref 36.0–46.0)
Hemoglobin: 9.3 g/dL — ABNORMAL LOW (ref 12.0–15.0)
MCH: 29.7 pg (ref 26.0–34.0)
MCHC: 30.4 g/dL (ref 30.0–36.0)
MCV: 97.8 fL (ref 80.0–100.0)
Platelets: 224 10*3/uL (ref 150–400)
RBC: 3.13 MIL/uL — ABNORMAL LOW (ref 3.87–5.11)
RDW: 15.3 % (ref 11.5–15.5)
WBC: 7.3 10*3/uL (ref 4.0–10.5)
nRBC: 0.3 % — ABNORMAL HIGH (ref 0.0–0.2)

## 2019-06-25 LAB — HEPARIN LEVEL (UNFRACTIONATED): Heparin Unfractionated: 0.21 IU/mL — ABNORMAL LOW (ref 0.30–0.70)

## 2019-06-25 MED ORDER — NYSTATIN 100000 UNIT/GM EX POWD
Freq: Three times a day (TID) | CUTANEOUS | Status: DC
  Administered 2019-06-25: 15:00:00 via TOPICAL
  Administered 2019-06-25: 1 g via TOPICAL
  Administered 2019-06-25 – 2019-06-28 (×5): via TOPICAL
  Filled 2019-06-25 (×2): qty 15

## 2019-06-25 MED ORDER — FUROSEMIDE 20 MG PO TABS
20.0000 mg | ORAL_TABLET | Freq: Every day | ORAL | Status: DC
  Administered 2019-06-25 – 2019-06-28 (×4): 20 mg via ORAL
  Filled 2019-06-25 (×4): qty 1

## 2019-06-25 MED ORDER — APIXABAN 5 MG PO TABS
5.0000 mg | ORAL_TABLET | Freq: Two times a day (BID) | ORAL | Status: DC
  Administered 2019-06-25 – 2019-06-28 (×7): 5 mg via ORAL
  Filled 2019-06-25 (×7): qty 1

## 2019-06-25 MED ORDER — SODIUM CHLORIDE 0.9 % IV SOLN
INTRAVENOUS | Status: DC | PRN
  Administered 2019-06-25: 250 mL via INTRAVENOUS

## 2019-06-25 MED ORDER — HEPARIN BOLUS VIA INFUSION
1000.0000 [IU] | Freq: Once | INTRAVENOUS | Status: AC
  Administered 2019-06-25: 1000 [IU] via INTRAVENOUS
  Filled 2019-06-25: qty 1000

## 2019-06-25 NOTE — Progress Notes (Signed)
Pt has been very agitated since MN. Pt has not slept all night, she keeps pulling off her leads, her purewick and BP cuff, stating she wants to go home.  RN has given Xanax and Tramadol to try and calm patient, unsuccessful.   Will given Xanax when due again. Pt bathed. Will continue to monitor

## 2019-06-25 NOTE — Progress Notes (Signed)
[place] for heparin Indication: atrial fibrillation  Allergies  Allergen Reactions  . Sulfa Antibiotics Anaphylaxis    Facial swelling   . Codeine Other (See Comments)    BLACK OUTS  . Doxycycline Nausea Only  . Erythromycin Swelling  . Ibuprofen Other (See Comments)    Stomach pain, muscle spasm, GI bleeding  . Levaquin [Levofloxacin] Other (See Comments)    Insomnia/trouble breathing  . Penicillins Diarrhea    Has patient had a PCN reaction causing immediate rash, facial/tongue/throat swelling, SOB or lightheadedness with hypotension: No Has patient had a PCN reaction causing severe rash involving mucus membranes or skin necrosis: No Has patient had a PCN reaction that required hospitalization Unknown Has patient had a PCN reaction occurring within the last 10 years: No If all of the above answers are "NO", then may proceed with Cephalosporin use.     Patient Measurements: Height: 5' 2"  (157.5 cm) Weight: 197 lb 5 oz (89.5 kg) IBW/kg (Calculated) : 50.1 Heparin Dosing Weight: 70 kg  Vital Signs: Temp: 97.7 F (36.5 C) (11/14 0428) BP: 112/63 (11/14 0700) Pulse Rate: 74 (11/14 0700)  Labs: Recent Labs    06/22/19 1032 06/22/19 1457  06/23/19 0441  06/23/19 2349 06/24/19 0405 06/24/19 1025 06/25/19 0455 06/25/19 0456  HGB  --   --    < > 8.8*  --   --  9.1*  --  9.3*  --   HCT  --   --   --  28.8*  --   --  29.7*  --  30.6*  --   PLT  --   --   --  221  --   --  219  --  224  --   HEPARINUNFRC  --   --    < >  --    < > 0.72*  --  0.60  --  0.21*  CREATININE  --   --   --  1.79*  --   --  2.12*  --  1.59*  --   TROPONINIHS 8 7  --   --   --   --   --   --   --   --    < > = values in this interval not displayed.    Estimated Creatinine Clearance: 29.8 mL/min (A) (by C-G formula based on SCr of 1.59 mg/dL (H)).  Assessment: 78 y.o. F on heparin for afib. F/U plan   Heparin level is subtherapeutic (0.21)  No bleeding  noted. D/W RN and no problems with infusion  Goal of Therapy:  Heparin level 0.3-0.7 units/ml Monitor platelets by anticoagulation protocol: Yes   Plan:  Heparin bolus 1000 units, then increase heparin drip to 700 units/hr Check  HL in ~8 hours and daily Monitor for S/S of bleeding  Isac Sarna, BS [place], BCPS Clinical Pharmacist Pager [phone] 06/25/2019 9:16 AM

## 2019-06-25 NOTE — Progress Notes (Signed)
PROGRESS NOTE    Tamara Stein  HUO:[id number] DOB: 1940/11/14 DOA: 06/21/2019 PCP: Janora Norlander, DO    Brief Narrative:  78 year old female who presented with weakness.  She does have significant past medical history of anxiety, depression, chronic kidney disease stage II, spinal cerebellar degeneration, hypothyroidism and diverticulosis.  She reported weakness, nausea and vague abdominal discomfort.  She had dyspnea on exertion but no chest pain or palpitations.  On her initial physical examination her temperature was 96.9, blood pressure 127/76 and heart rate 102, oxygen saturation 94% on room air.  Heart S1-S2 present, irregular lungs with decreased breath sounds bilaterally, scattered rails, abdomen protuberant, nontender, positive lower extremity edema.  Urinalysis had specific gravity 1.015, more than 50 white cells.  Chest x-ray with no infiltrates.  EKG 104 bpm, normal axis, atrial fibrillation rhythm, no ST segment or T wave changes.  Patient was admitted to the hospital with working diagnosis of new onset atrial fibrillation complicated by urine tract infection   Assessment & Plan:   Principal Problem:   Atrial fibrillation with RVR (HCC) Active Problems:   GERD (gastroesophageal reflux disease)   Essential hypertension with goal blood pressure less than 140/90   Hyperlipidemia   Cerebellar degeneration (HCC)   Depression   Hypothyroid   CKD (chronic kidney disease) stage 3, GFR 30-59 ml/min ([place])   1. New onset atrial fibrillation with RVR. Recent echocardiogram from last month with preserved LV systolic function (no need to repeat study).  She was treated with Cardizem infusion but has since converted back to sinus rhythm.  Currently on Cardizem p.o. with stable heart rate. Continue telemetry monitoring.  Transition anticoagulation from heparin to Eliquis. Chads VAsc score is 4.   2. Urinary tract infection with sepsis, (end organ damage AKI)./ present on  admission.  Urine culture positive for Enterobacter.  She has been receiving ceftriaxone, and is not having any fever or dysuria at this time.  Interestingly, her urine culture shows Enterobacter that is resistant to ceftriaxone.  Other resistant antibiotics include cefazolin and Zosyn.  She does not have any significant leukocytosis.  I suspect the her urine culture may be a colonization.  Discontinue further ceftriaxone and monitor her off of antibiotics.  3. AKI on CKD stage 3a with hypokalemia.  Reviewed with nephrology and it was felt that her episodes of hypotension were likely causing some degree of ATN.  Blood pressures are since stabilized and overall renal function is improving.  Renal ultrasound does not show any evidence of hydronephrosis.  Continue to hold home dose of lisinopril.  4. HTN with dyslipidemia. Continue blood pressure monitoring will continue to hold amlodipine, lisinopril and hydralazine to avoid hypotension. Continue with statin therapy.   5. Hypothyroid. Continue with levothyroxine.   6. Chronic anemia. Multifactorial continue to follow cell count.   7. Spinocerebellar degeneration. Ambulatory dysfunction, seen by physical therapy with recommendations for home health therapy.  8.  Chronic diastolic congestive heart failure.  She is chronically on Lasix.  This was held on admission due to elevated creatinine.  She does have evidence of fluid overload with significant lower extremity edema.  We will start her back on her home dose of Lasix since her creatinine is now trending down.  9.  Right pleural effusion.  Noted on chest x-ray.  Likely related to CHF.  Restarted on diuretics.    DVT prophylaxis: Eliquis  Code Status:  dnr  Family Communication:  No family at the bedside  Disposition Plan/ discharge barriers:  pending clinical improvement.  Transferred to telemetry bed.  Body mass index is 36.09 kg/m. Malnutrition Type:      Malnutrition Characteristics:       Nutrition Interventions:     RN Pressure Injury Documentation:     Consultants:     Procedures:     Antimicrobials:  Ceftriaxone 11/11 > 11/14  Subjective: Denies any shortness of breath or chest pain.  She did was complaining of orthopnea earlier in her hospital stay.  Objective: Vitals:   06/25/19 1300 06/25/19 1400 06/25/19 1500 06/25/19 1700  BP:      Pulse: 75 74 65   Resp: 15 13 14    Temp:    98 F (36.7 C)  TempSrc:    Oral  SpO2: 99% 100% 98%   Weight:      Height:        Intake/Output Summary (Last 24 hours) at 06/25/2019 1719 Last data filed at 06/25/2019 1100 Gross per 24 hour  Intake 1438.71 ml  Output 300 ml  Net 1138.71 ml   Filed Weights   06/22/19 1622 06/24/19 0500 06/25/19 0500  Weight: 86.9 kg 89.5 kg 89.5 kg    Examination:   General exam: Alert, awake, oriented x 3 Respiratory system: Diminished breath sounds at bases. Respiratory effort normal. Cardiovascular system:RRR. No murmurs, rubs, gallops. Gastrointestinal system: Abdomen is nondistended, soft and nontender. No organomegaly or masses felt. Normal bowel sounds heard. Central nervous system: Speech is difficult to comprehend.  No focal neurological deficits. Extremities: 1-2+ edema bilaterally lower extremities Skin: No rashes, lesions or ulcers Psychiatry: Judgement and insight appear normal. Mood & affect appropriate.     Data Reviewed: I have personally reviewed following labs and imaging studies  CBC: Recent Labs  Lab 06/21/19 1857 06/22/19 0400 06/23/19 0441 06/24/19 0405 06/25/19 0455  WBC 13.7* 18.6* 10.2 6.8 7.3  NEUTROABS 12.6*  --  7.9*  --   --   HGB 10.8* 9.6* 8.8* 9.1* 9.3*  HCT 35.2* 31.3* 28.8* 29.7* 30.6*  MCV 97.8 98.1 98.3 96.1 97.8  PLT 302 258 221 219 619   Basic Metabolic Panel: Recent Labs  Lab 06/21/19 1857 06/22/19 0400 06/23/19 0441 06/24/19 0405 06/25/19 0455  NA 138 141 141 139 141  K 3.1* 3.3* 3.2* 3.8 3.7  CL 103  104 104 106 106  CO2 22 24 22  21* 24  GLUCOSE 140* 141* 119* 125* 136*  BUN 28* 31* 36* 40* 35*  CREATININE 1.56* 1.65* 1.79* 2.12* 1.59*  CALCIUM 8.3* 8.1* 7.8* 7.6* 8.0*  MG  --   --   --  1.6*  --    GFR: Estimated Creatinine Clearance: 29.8 mL/min (A) (by C-G formula based on SCr of 1.59 mg/dL (H)). Liver Function Tests: Recent Labs  Lab 06/21/19 1857  AST 28  ALT 22  ALKPHOS 68  BILITOT 0.9  PROT 6.2*  ALBUMIN 3.1*   No results for input(s): LIPASE, AMYLASE in the last 168 hours. No results for input(s): AMMONIA in the last 168 hours. Coagulation Profile: Recent Labs  Lab 06/21/19 1857  INR 1.1   Cardiac Enzymes: No results for input(s): CKTOTAL, CKMB, CKMBINDEX, TROPONINI in the last 168 hours. BNP (last 3 results) No results for input(s): PROBNP in the last 8760 hours. HbA1C: No results for input(s): HGBA1C in the last 72 hours. CBG: Recent Labs  Lab 06/21/19 1839  GLUCAP 137*   Lipid Profile: No results for input(s): CHOL, HDL, LDLCALC, TRIG, CHOLHDL, LDLDIRECT in the last 72  hours. Thyroid Function Tests: No results for input(s): TSH, T4TOTAL, FREET4, T3FREE, THYROIDAB in the last 72 hours. Anemia Panel: No results for input(s): VITAMINB12, FOLATE, FERRITIN, TIBC, IRON, RETICCTPCT in the last 72 hours.    Radiology Studies: I have reviewed all of the imaging during this hospital visit personally     Scheduled Meds: . apixaban  5 mg Oral BID  . aspirin EC  81 mg Oral Q breakfast  . atorvastatin  40 mg Oral QHS  . Chlorhexidine Gluconate Cloth  6 each Topical Daily  . diltiazem  60 mg Oral Q6H  . DULoxetine  60 mg Oral q morning - 10a  . famotidine  10 mg Oral Daily  . levothyroxine  50 mcg Oral QAC breakfast  . loratadine  10 mg Oral Daily  . multivitamin with minerals  1 tablet Oral Daily  . nystatin   Topical TID  . pantoprazole (PROTONIX) IV  40 mg Intravenous Q24H  . sodium chloride flush  3 mL Intravenous Q12H   Continuous Infusions:  . sodium chloride    . cefTRIAXone (ROCEPHIN)  IV 1 g (06/24/19 2205)     LOS: 4 days        Kathie Dike, MD

## 2019-06-25 NOTE — Progress Notes (Signed)
[place] for heparin--> eliquis Indication: atrial fibrillation  Allergies  Allergen Reactions  . Sulfa Antibiotics Anaphylaxis    Facial swelling   . Codeine Other (See Comments)    BLACK OUTS  . Doxycycline Nausea Only  . Erythromycin Swelling  . Ibuprofen Other (See Comments)    Stomach pain, muscle spasm, GI bleeding  . Levaquin [Levofloxacin] Other (See Comments)    Insomnia/trouble breathing  . Penicillins Diarrhea    Has patient had a PCN reaction causing immediate rash, facial/tongue/throat swelling, SOB or lightheadedness with hypotension: No Has patient had a PCN reaction causing severe rash involving mucus membranes or skin necrosis: No Has patient had a PCN reaction that required hospitalization Unknown Has patient had a PCN reaction occurring within the last 10 years: No If all of the above answers are "NO", then may proceed with Cephalosporin use.     Patient Measurements: Height: 5' 2"  (157.5 cm) Weight: 197 lb 5 oz (89.5 kg) IBW/kg (Calculated) : 50.1 Heparin Dosing Weight: 70 kg  Vital Signs: Temp: 97.9 F (36.6 C) (11/14 0900) Temp Source: Oral (11/14 0900) BP: 129/72 (11/14 1100) Pulse Rate: 72 (11/14 1100)  Labs: Recent Labs    06/22/19 1457  06/23/19 0441  06/23/19 2349 06/24/19 0405 06/24/19 1025 06/25/19 0455 06/25/19 0456  HGB  --    < > 8.8*  --   --  9.1*  --  9.3*  --   HCT  --   --  28.8*  --   --  29.7*  --  30.6*  --   PLT  --   --  221  --   --  219  --  224  --   HEPARINUNFRC  --    < >  --    < > 0.72*  --  0.60  --  0.21*  CREATININE  --   --  1.79*  --   --  2.12*  --  1.59*  --   TROPONINIHS 7  --   --   --   --   --   --   --   --    < > = values in this interval not displayed.    Estimated Creatinine Clearance: 29.8 mL/min (A) (by C-G formula based on SCr of 1.59 mg/dL (H)).  Assessment: 78 y.o. F on heparin for afib. Plan to transition to eliquis now  Goal of Therapy:  Monitor  platelets by anticoagulation protocol: Yes   Plan:  D/c heparin Eliquis 38m po bid Educate on eliquis Monitor for S/S of bleeding  LIsac Sarna BS PVena Austria BCaliforniaClinical Pharmacist Pager #[phone]/14/2020 11:27 AM

## 2019-06-26 DIAGNOSIS — E039 Hypothyroidism, unspecified: Secondary | ICD-10-CM

## 2019-06-26 DIAGNOSIS — E785 Hyperlipidemia, unspecified: Secondary | ICD-10-CM

## 2019-06-26 DIAGNOSIS — I10 Essential (primary) hypertension: Secondary | ICD-10-CM

## 2019-06-26 DIAGNOSIS — N179 Acute kidney failure, unspecified: Secondary | ICD-10-CM

## 2019-06-26 LAB — BASIC METABOLIC PANEL
Anion gap: 9 (ref 5–15)
BUN: 30 mg/dL — ABNORMAL HIGH (ref 8–23)
CO2: 25 mmol/L (ref 22–32)
Calcium: 8.4 mg/dL — ABNORMAL LOW (ref 8.9–10.3)
Chloride: 106 mmol/L (ref 98–111)
Creatinine, Ser: 1.26 mg/dL — ABNORMAL HIGH (ref 0.44–1.00)
GFR calc Af Amer: 47 mL/min — ABNORMAL LOW (ref >60)
GFR calc non Af Amer: 40 mL/min — ABNORMAL LOW (ref >60)
Glucose, Bld: 129 mg/dL — ABNORMAL HIGH (ref 70–99)
Potassium: 3.6 mmol/L (ref 3.5–5.1)
Sodium: 140 mmol/L (ref 135–145)

## 2019-06-26 LAB — CULTURE, BLOOD (ROUTINE X 2)
Culture: NO GROWTH
Culture: NO GROWTH
Special Requests: ADEQUATE
Special Requests: ADEQUATE

## 2019-06-26 LAB — CBC
HCT: 32.6 % — ABNORMAL LOW (ref 36.0–46.0)
Hemoglobin: 10 g/dL — ABNORMAL LOW (ref 12.0–15.0)
MCH: 30.1 pg (ref 26.0–34.0)
MCHC: 30.7 g/dL (ref 30.0–36.0)
MCV: 98.2 fL (ref 80.0–100.0)
Platelets: 247 10*3/uL (ref 150–400)
RBC: 3.32 MIL/uL — ABNORMAL LOW (ref 3.87–5.11)
RDW: 15.1 % (ref 11.5–15.5)
WBC: 9.9 10*3/uL (ref 4.0–10.5)
nRBC: 0 % (ref 0.0–0.2)

## 2019-06-26 MED ORDER — FUROSEMIDE 10 MG/ML IJ SOLN
40.0000 mg | Freq: Once | INTRAMUSCULAR | Status: AC
  Administered 2019-06-26: 40 mg via INTRAVENOUS
  Filled 2019-06-26: qty 4

## 2019-06-26 MED ORDER — DILTIAZEM HCL ER COATED BEADS 240 MG PO CP24
240.0000 mg | ORAL_CAPSULE | Freq: Every day | ORAL | Status: DC
  Administered 2019-06-26 – 2019-06-28 (×3): 240 mg via ORAL
  Filled 2019-06-26 (×3): qty 1

## 2019-06-26 NOTE — Progress Notes (Signed)
PROGRESS NOTE    Tamara Stein  IEP:[id number] DOB: 1940/10/06 DOA: 06/21/2019 PCP: Janora Norlander, DO    Brief Narrative:  78 year old female who presented with weakness.  She does have significant past medical history of anxiety, depression, chronic kidney disease stage II, spinal cerebellar degeneration, hypothyroidism and diverticulosis.  She reported weakness, nausea and vague abdominal discomfort.  She had dyspnea on exertion but no chest pain or palpitations.  On her initial physical examination her temperature was 96.9, blood pressure 127/76 and heart rate 102, oxygen saturation 94% on room air.  Heart S1-S2 present, irregular lungs with decreased breath sounds bilaterally, scattered rails, abdomen protuberant, nontender, positive lower extremity edema.  Urinalysis had specific gravity 1.015, more than 50 white cells.  Chest x-ray with no infiltrates.  EKG 104 bpm, normal axis, atrial fibrillation rhythm, no ST segment or T wave changes.  Patient was admitted to the hospital with working diagnosis of new onset atrial fibrillation complicated by urine tract infection   Assessment & Plan:   Principal Problem:   Atrial fibrillation with RVR (HCC) Active Problems:   GERD (gastroesophageal reflux disease)   Essential hypertension with goal blood pressure less than 140/90   Hyperlipidemia   Cerebellar degeneration (HCC)   Depression   Hypothyroid   CKD (chronic kidney disease) stage 3, GFR 30-59 ml/min ([place])   1. New onset atrial fibrillation with RVR. Recent echocardiogram from last month with preserved LV systolic function (no need to repeat study).  She was treated with Cardizem infusion and subsequently converted back to sinus rhythm.  Currently on Cardizem p.o. with stable heart rate.  Will transition to Cardizem CD.  Continue telemetry monitoring.  Continue anticoagulation with Eliquis. Chads VAsc score is 4.   2. Urinary tract infection with sepsis, (end organ damage  AKI)./ present on admission.  Urine culture positive for Enterobacter.  She has been receiving ceftriaxone, and is not having any fever or dysuria at this time.  Interestingly, her urine culture shows Enterobacter that is resistant to ceftriaxone.  Other resistant antibiotics include cefazolin and Zosyn.  She does not have any significant leukocytosis.  I suspect the her urine culture may be a colonization.  Discontinue further ceftriaxone and monitor her off of antibiotics.  3. AKI on CKD stage 3a with hypokalemia.  Reviewed with nephrology and it was felt that her episodes of hypotension were likely causing some degree of ATN.  Blood pressures are since stabilized and overall renal function is improving.  Renal ultrasound does not show any evidence of hydronephrosis.  Continue to hold home dose of lisinopril.  4. HTN with dyslipidemia. Continue blood pressure monitoring will continue to hold amlodipine, lisinopril and hydralazine to avoid hypotension. Continue with statin therapy.   5. Hypothyroid. Continue with levothyroxine.   6. Chronic anemia. Multifactorial continue to follow cell count.   7. Spinocerebellar degeneration. Ambulatory dysfunction, seen by physical therapy with recommendations for home health therapy.  8.  Chronic diastolic congestive heart failure.  She is chronically on Lasix.  This was held on admission due to elevated creatinine.  She does have evidence of fluid overload with significant lower extremity edema.  She has been restarted on her home dose of Lasix.  We will give an extra IV dose today..  9.  Right pleural effusion.  Noted on chest x-ray.  Likely related to CHF.  Restarted on diuretics.    DVT prophylaxis: Eliquis  Code Status:  dnr  Family Communication:  No family at the bedside  Disposition Plan/ discharge barriers: pending clinical improvement.  Transferred to telemetry bed.  Body mass index is 37.06 kg/m. Malnutrition Type:      Malnutrition  Characteristics:      Nutrition Interventions:     RN Pressure Injury Documentation:     Consultants:     Procedures:     Antimicrobials:  Ceftriaxone 11/11 > 11/14  Subjective: Still has some shortness of breath when trying to lay flat.  Continues to have swelling in her lower extremities.  Objective: Vitals:   06/25/19 2300 06/25/19 2347 06/26/19 0527 06/26/19 1409  BP: (!) 144/73 (!) 142/76 (!) 159/92 (!) 145/84  Pulse: 77 80  90  Resp: 18 (!) 22  18  Temp:  98.8 F (37.1 C)  97.9 F (36.6 C)  TempSrc:  Oral  Oral  SpO2: 98% 100% 97% 95%  Weight:   91.9 kg   Height:        Intake/Output Summary (Last 24 hours) at 06/26/2019 1900 Last data filed at 06/26/2019 1700 Gross per 24 hour  Intake 1052.7 ml  Output 2100 ml  Net -1047.3 ml   Filed Weights   06/24/19 0500 06/25/19 0500 06/26/19 0527  Weight: 89.5 kg 89.5 kg 91.9 kg    Examination:   General exam: Alert, awake, oriented x 3 Respiratory system: Crackles at bases. Respiratory effort normal. Cardiovascular system:RRR. No murmurs, rubs, gallops. Gastrointestinal system: Abdomen is nondistended, soft and nontender. No organomegaly or masses felt. Normal bowel sounds heard. Central nervous system: Alert and oriented. No focal neurological deficits. Extremities: 1-2+ pedal edema bilaterally Skin: No rashes, lesions or ulcers Psychiatry: Judgement and insight appear normal. Mood & affect appropriate.      Data Reviewed: I have personally reviewed following labs and imaging studies  CBC: Recent Labs  Lab 06/21/19 1857 06/22/19 0400 06/23/19 0441 06/24/19 0405 06/25/19 0455 06/26/19 0828  WBC 13.7* 18.6* 10.2 6.8 7.3 9.9  NEUTROABS 12.6*  --  7.9*  --   --   --   HGB 10.8* 9.6* 8.8* 9.1* 9.3* 10.0*  HCT 35.2* 31.3* 28.8* 29.7* 30.6* 32.6*  MCV 97.8 98.1 98.3 96.1 97.8 98.2  PLT 302 258 221 219 224 829   Basic Metabolic Panel: Recent Labs  Lab 06/22/19 0400 06/23/19 0441 06/24/19  0405 06/25/19 0455 06/26/19 0828  NA 141 141 139 141 140  K 3.3* 3.2* 3.8 3.7 3.6  CL 104 104 106 106 106  CO2 24 22 21* 24 25  GLUCOSE 141* 119* 125* 136* 129*  BUN 31* 36* 40* 35* 30*  CREATININE 1.65* 1.79* 2.12* 1.59* 1.26*  CALCIUM 8.1* 7.8* 7.6* 8.0* 8.4*  MG  --   --  1.6*  --   --    GFR: Estimated Creatinine Clearance: 38.2 mL/min (A) (by C-G formula based on SCr of 1.26 mg/dL (H)). Liver Function Tests: Recent Labs  Lab 06/21/19 1857  AST 28  ALT 22  ALKPHOS 68  BILITOT 0.9  PROT 6.2*  ALBUMIN 3.1*   No results for input(s): LIPASE, AMYLASE in the last 168 hours. No results for input(s): AMMONIA in the last 168 hours. Coagulation Profile: Recent Labs  Lab 06/21/19 1857  INR 1.1   Cardiac Enzymes: No results for input(s): CKTOTAL, CKMB, CKMBINDEX, TROPONINI in the last 168 hours. BNP (last 3 results) No results for input(s): PROBNP in the last 8760 hours. HbA1C: No results for input(s): HGBA1C in the last 72 hours. CBG: Recent Labs  Lab 06/21/19 1839  GLUCAP 137*  Lipid Profile: No results for input(s): CHOL, HDL, LDLCALC, TRIG, CHOLHDL, LDLDIRECT in the last 72 hours. Thyroid Function Tests: No results for input(s): TSH, T4TOTAL, FREET4, T3FREE, THYROIDAB in the last 72 hours. Anemia Panel: No results for input(s): VITAMINB12, FOLATE, FERRITIN, TIBC, IRON, RETICCTPCT in the last 72 hours.    Radiology Studies: I have reviewed all of the imaging during this hospital visit personally     Scheduled Meds: . apixaban  5 mg Oral BID  . aspirin EC  81 mg Oral Q breakfast  . atorvastatin  40 mg Oral QHS  . Chlorhexidine Gluconate Cloth  6 each Topical Daily  . diltiazem  240 mg Oral Daily  . DULoxetine  60 mg Oral q morning - 10a  . famotidine  10 mg Oral Daily  . furosemide  20 mg Oral Daily  . levothyroxine  50 mcg Oral QAC breakfast  . loratadine  10 mg Oral Daily  . multivitamin with minerals  1 tablet Oral Daily  . nystatin   Topical  TID  . pantoprazole (PROTONIX) IV  40 mg Intravenous Q24H  . sodium chloride flush  3 mL Intravenous Q12H   Continuous Infusions: . sodium chloride    . sodium chloride 250 mL (06/25/19 2120)     LOS: 5 days        Kathie Dike, MD

## 2019-06-26 NOTE — Plan of Care (Signed)

## 2019-06-27 LAB — CBC
HCT: 31.2 % — ABNORMAL LOW (ref 36.0–46.0)
Hemoglobin: 9.8 g/dL — ABNORMAL LOW (ref 12.0–15.0)
MCH: 30.2 pg (ref 26.0–34.0)
MCHC: 31.4 g/dL (ref 30.0–36.0)
MCV: 96.3 fL (ref 80.0–100.0)
Platelets: 221 10*3/uL (ref 150–400)
RBC: 3.24 MIL/uL — ABNORMAL LOW (ref 3.87–5.11)
RDW: 15.2 % (ref 11.5–15.5)
WBC: 8.1 10*3/uL (ref 4.0–10.5)
nRBC: 0 % (ref 0.0–0.2)

## 2019-06-27 LAB — CREATININE, SERUM
Creatinine, Ser: 1.11 mg/dL — ABNORMAL HIGH (ref 0.44–1.00)
GFR calc Af Amer: 55 mL/min — ABNORMAL LOW (ref >60)
GFR calc non Af Amer: 47 mL/min — ABNORMAL LOW (ref >60)

## 2019-06-27 MED ORDER — FUROSEMIDE 10 MG/ML IJ SOLN
40.0000 mg | Freq: Once | INTRAMUSCULAR | Status: AC
  Administered 2019-06-27: 40 mg via INTRAVENOUS
  Filled 2019-06-27: qty 4

## 2019-06-27 MED ORDER — POTASSIUM CHLORIDE CRYS ER 20 MEQ PO TBCR
40.0000 meq | EXTENDED_RELEASE_TABLET | Freq: Once | ORAL | Status: AC
  Administered 2019-06-27: 40 meq via ORAL
  Filled 2019-06-27: qty 2

## 2019-06-27 MED ORDER — PANTOPRAZOLE SODIUM 40 MG PO TBEC
40.0000 mg | DELAYED_RELEASE_TABLET | Freq: Every day | ORAL | Status: DC
  Administered 2019-06-28: 40 mg via ORAL
  Filled 2019-06-27: qty 1

## 2019-06-27 NOTE — Progress Notes (Signed)
PROGRESS NOTE    Tamara Stein  QPR:[id number] DOB: 1941/07/31 DOA: 06/21/2019 PCP: Janora Norlander, DO    Brief Narrative:  78 year old female who presented with weakness.  She does have significant past medical history of anxiety, depression, chronic kidney disease stage II, spinal cerebellar degeneration, hypothyroidism and diverticulosis.  She reported weakness, nausea and vague abdominal discomfort.  She had dyspnea on exertion but no chest pain or palpitations.  On her initial physical examination her temperature was 96.9, blood pressure 127/76 and heart rate 102, oxygen saturation 94% on room air.  Heart S1-S2 present, irregular lungs with decreased breath sounds bilaterally, scattered rails, abdomen protuberant, nontender, positive lower extremity edema.  Urinalysis had specific gravity 1.015, more than 50 white cells.  Chest x-ray with no infiltrates.  EKG 104 bpm, normal axis, atrial fibrillation rhythm, no ST segment or T wave changes.  Patient was admitted to the hospital with working diagnosis of new onset atrial fibrillation complicated by urine tract infection   Assessment & Plan:   Principal Problem:   Atrial fibrillation with RVR (HCC) Active Problems:   GERD (gastroesophageal reflux disease)   Essential hypertension with goal blood pressure less than 140/90   Hyperlipidemia   Cerebellar degeneration (HCC)   Depression   Hypothyroid   CKD (chronic kidney disease) stage 3, GFR 30-59 ml/min ([place])   1. New onset atrial fibrillation with RVR. Recent echocardiogram from last month with preserved LV systolic function (no need to repeat study).  She was treated with Cardizem infusion and subsequently converted back to sinus rhythm.  Currently on Cardizem p.o. with stable heart rate.  Will transition to Cardizem CD.  Continue telemetry monitoring.  Continue anticoagulation with Eliquis. Chads VAsc score is 4.   2. Urinary tract infection with sepsis, (end organ damage  AKI)./ present on admission.  Urine culture positive for Enterobacter.  She has been receiving ceftriaxone, and is not having any fever or dysuria at this time.  Interestingly, her urine culture shows Enterobacter that is resistant to ceftriaxone.  Other resistant antibiotics include cefazolin and Zosyn.  She does not have any significant leukocytosis.  I suspect the her urine culture may be a colonization.  Discontinue further ceftriaxone and monitor her off of antibiotics.  3. AKI on CKD stage 3a with hypokalemia.  Reviewed with nephrology and it was felt that her episodes of hypotension were likely causing some degree of ATN.  Blood pressures are since stabilized and overall renal function is improving.  Renal ultrasound does not show any evidence of hydronephrosis.  Continue to hold home dose of lisinopril.  4. HTN with dyslipidemia. Continue blood pressure monitoring will continue to hold amlodipine, lisinopril and hydralazine to avoid hypotension. Continue with statin therapy.   5. Hypothyroid. Continue with levothyroxine.   6. Chronic anemia. Multifactorial continue to follow cell count.   7. Spinocerebellar degeneration. Ambulatory dysfunction, seen by physical therapy with recommendations for home health therapy.  8.  Chronic diastolic congestive heart failure.  She is chronically on Lasix.  This was held on admission due to elevated creatinine.  She does have evidence of fluid overload with significant lower extremity edema.  She has been restarted on her home dose of Lasix.  We will give an extra IV dose today.  She has had good urine output with diuretics.  9.  Right pleural effusion.  Noted on chest x-ray.  Likely related to CHF.  Restarted on diuretics.  Respiratory status improving    DVT prophylaxis: Eliquis  Code Status:  dnr  Family Communication: Discussed with her son-in-law, Mariel Sleet over the phone Disposition Plan/ discharge barriers: pending clinical improvement.   Patient wishes to discharge to skilled nursing facility for long-term care.  We will asked social work to see if there is a possibility.  She will likely be ready for discharge in the next 24 hours.  Body mass index is 36.25 kg/m. Malnutrition Type:      Malnutrition Characteristics:      Nutrition Interventions:     RN Pressure Injury Documentation:     Consultants:     Procedures:     Antimicrobials:  Ceftriaxone 11/11 > 11/14  Subjective: Feels that her swelling in her lower extremity is improving although not back to baseline.  Still has some shortness of breath when trying to lay flat.  Objective: Vitals:   06/27/19 0444 06/27/19 0500 06/27/19 0906 06/27/19 1313  BP: (!) 165/85  (!) 152/78 (!) 141/83  Pulse: 90  96 87  Resp: 20  18 19   Temp: 98.4 F (36.9 C)     TempSrc: Oral     SpO2: 94%  95% 97%  Weight:  89.9 kg    Height:        Intake/Output Summary (Last 24 hours) at 06/27/2019 1919 Last data filed at 06/27/2019 1700 Gross per 24 hour  Intake 360 ml  Output 1950 ml  Net -1590 ml   Filed Weights   06/25/19 0500 06/26/19 0527 06/27/19 0500  Weight: 89.5 kg 91.9 kg 89.9 kg    Examination:   General exam: Alert, awake, oriented x 3 Respiratory system: Crackles at bases. Respiratory effort normal. Cardiovascular system:RRR. No murmurs, rubs, gallops. Gastrointestinal system: Abdomen is nondistended, soft and nontender. No organomegaly or masses felt. Normal bowel sounds heard. Central nervous system: Alert and oriented. No focal neurological deficits. Extremities: 1+ edema bilaterally Skin: No rashes, lesions or ulcers Psychiatry: Judgement and insight appear normal. Mood & affect appropriate.       Data Reviewed: I have personally reviewed following labs and imaging studies  CBC: Recent Labs  Lab 06/21/19 1857  06/23/19 0441 06/24/19 0405 06/25/19 0455 06/26/19 0828 06/27/19 0513  WBC 13.7*   < > 10.2 6.8 7.3 9.9 8.1   NEUTROABS 12.6*  --  7.9*  --   --   --   --   HGB 10.8*   < > 8.8* 9.1* 9.3* 10.0* 9.8*  HCT 35.2*   < > 28.8* 29.7* 30.6* 32.6* 31.2*  MCV 97.8   < > 98.3 96.1 97.8 98.2 96.3  PLT 302   < > 221 219 224 247 221   < > = values in this interval not displayed.   Basic Metabolic Panel: Recent Labs  Lab 06/22/19 0400 06/23/19 0441 06/24/19 0405 06/25/19 0455 06/26/19 0828 06/27/19 0513  NA 141 141 139 141 140  --   K 3.3* 3.2* 3.8 3.7 3.6  --   CL 104 104 106 106 106  --   CO2 24 22 21* 24 25  --   GLUCOSE 141* 119* 125* 136* 129*  --   BUN 31* 36* 40* 35* 30*  --   CREATININE 1.65* 1.79* 2.12* 1.59* 1.26* 1.11*  CALCIUM 8.1* 7.8* 7.6* 8.0* 8.4*  --   MG  --   --  1.6*  --   --   --    GFR: Estimated Creatinine Clearance: 42.8 mL/min (A) (by C-G formula based on SCr of 1.11 mg/dL (H)).  Liver Function Tests: Recent Labs  Lab 06/21/19 1857  AST 28  ALT 22  ALKPHOS 68  BILITOT 0.9  PROT 6.2*  ALBUMIN 3.1*   No results for input(s): LIPASE, AMYLASE in the last 168 hours. No results for input(s): AMMONIA in the last 168 hours. Coagulation Profile: Recent Labs  Lab 06/21/19 1857  INR 1.1   Cardiac Enzymes: No results for input(s): CKTOTAL, CKMB, CKMBINDEX, TROPONINI in the last 168 hours. BNP (last 3 results) No results for input(s): PROBNP in the last 8760 hours. HbA1C: No results for input(s): HGBA1C in the last 72 hours. CBG: Recent Labs  Lab 06/21/19 1839  GLUCAP 137*   Lipid Profile: No results for input(s): CHOL, HDL, LDLCALC, TRIG, CHOLHDL, LDLDIRECT in the last 72 hours. Thyroid Function Tests: No results for input(s): TSH, T4TOTAL, FREET4, T3FREE, THYROIDAB in the last 72 hours. Anemia Panel: No results for input(s): VITAMINB12, FOLATE, FERRITIN, TIBC, IRON, RETICCTPCT in the last 72 hours.    Radiology Studies: I have reviewed all of the imaging during this hospital visit personally     Scheduled Meds: . apixaban  5 mg Oral BID  . aspirin  EC  81 mg Oral Q breakfast  . atorvastatin  40 mg Oral QHS  . Chlorhexidine Gluconate Cloth  6 each Topical Daily  . diltiazem  240 mg Oral Daily  . DULoxetine  60 mg Oral q morning - 10a  . famotidine  10 mg Oral Daily  . furosemide  40 mg Intravenous Once  . furosemide  20 mg Oral Daily  . levothyroxine  50 mcg Oral QAC breakfast  . loratadine  10 mg Oral Daily  . multivitamin with minerals  1 tablet Oral Daily  . nystatin   Topical TID  . [START ON 06/28/2019] pantoprazole  40 mg Oral Daily  . potassium chloride  40 mEq Oral Once  . sodium chloride flush  3 mL Intravenous Q12H   Continuous Infusions: . sodium chloride    . sodium chloride 250 mL (06/25/19 2120)     LOS: 6 days        Kathie Dike, MD

## 2019-06-28 LAB — BASIC METABOLIC PANEL
Anion gap: 13 (ref 5–15)
BUN: 18 mg/dL (ref 8–23)
CO2: 33 mmol/L — ABNORMAL HIGH (ref 22–32)
Calcium: 9.2 mg/dL (ref 8.9–10.3)
Chloride: 96 mmol/L — ABNORMAL LOW (ref 98–111)
Creatinine, Ser: 1.22 mg/dL — ABNORMAL HIGH (ref 0.44–1.00)
GFR calc Af Amer: 49 mL/min — ABNORMAL LOW (ref >60)
GFR calc non Af Amer: 42 mL/min — ABNORMAL LOW (ref >60)
Glucose, Bld: 141 mg/dL — ABNORMAL HIGH (ref 70–99)
Potassium: 3.7 mmol/L (ref 3.5–5.1)
Sodium: 142 mmol/L (ref 135–145)

## 2019-06-28 LAB — CBC
HCT: 34.7 % — ABNORMAL LOW (ref 36.0–46.0)
Hemoglobin: 10.7 g/dL — ABNORMAL LOW (ref 12.0–15.0)
MCH: 29.6 pg (ref 26.0–34.0)
MCHC: 30.8 g/dL (ref 30.0–36.0)
MCV: 95.9 fL (ref 80.0–100.0)
Platelets: 240 10*3/uL (ref 150–400)
RBC: 3.62 MIL/uL — ABNORMAL LOW (ref 3.87–5.11)
RDW: 15.1 % (ref 11.5–15.5)
WBC: 7.9 10*3/uL (ref 4.0–10.5)
nRBC: 0 % (ref 0.0–0.2)

## 2019-06-28 LAB — MAGNESIUM: Magnesium: 1.4 mg/dL — ABNORMAL LOW (ref 1.7–2.4)

## 2019-06-28 MED ORDER — MAGNESIUM SULFATE 4 GM/100ML IV SOLN
4.0000 g | Freq: Once | INTRAVENOUS | Status: AC
  Administered 2019-06-28: 4 g via INTRAVENOUS
  Filled 2019-06-28: qty 100

## 2019-06-28 MED ORDER — APIXABAN 5 MG PO TABS: 5.0000 mg | ORAL_TABLET | Freq: Two times a day (BID) | ORAL | 0 refills | Status: DC

## 2019-06-28 MED ORDER — DILTIAZEM HCL ER COATED BEADS 240 MG PO CP24: 240.0000 mg | ORAL_CAPSULE | Freq: Every day | ORAL | 0 refills | Status: DC

## 2019-06-28 NOTE — Plan of Care (Signed)

## 2019-06-28 NOTE — Care Management Important Message (Signed)
Important Message  Patient Details  Name: Tamara Stein MRN: [id number] Date of Birth: 03-Feb-1941   Medicare Important Message Given:  Yes     [name] 06/28/2019, 4:04 PM

## 2019-06-28 NOTE — TOC Transition Note (Addendum)
Transition of Care [facility]) - CM/SW Discharge Note   Patient Details  Name: Tamara Stein MRN: [id number] Date of Birth: [birth year]  Transition of Care [facility]) CM/SW Contact:  Boneta Lucks, RN Phone Number: 06/28/2019, 10:55 AM   Clinical Narrative:   Patient is discharging home today. Patient is active with [facility] for PT/OT/RN, we are adding a SW,  [place] to update her with orders and discharge. Patient will go home by EMS.  Patient and family requesting social work to make a long term plan.  Patient lives with her sister and brother in [place].  Brother in law is primary caregiver for  Both women. Pharmacy gave [employer].   Final next level of care: (P) [place] Barriers to Discharge: Barriers Resolved   Patient Goals and CMS Choice Patient states their goals for this hospitalization and ongoing recovery are:: to go home with home health.   Discharge Plan and Services          DME Agency: (P) [employer]    HH Arranged: Social Work [employer] Agency: Microbiologist ([place]) Date [place]: 06/28/19 Time [place]: 1055 Representative spoke with at [place]: Romualdo Bolk   Readmission Risk Interventions Readmission Risk Prevention Plan 05/07/2019  Post Dischage Appt Complete  Medication Screening Complete  Transportation Screening Complete  Some recent data might be hidden

## 2019-06-28 NOTE — Discharge Summary (Signed)
Physician Discharge Summary  Tamara Stein YDX:[id number] DOB: Oct 22, 1940 DOA: 06/21/2019  PCP: Janora Norlander, DO  Admit date: 06/21/2019 Discharge date: 06/28/2019  Admitted From: Home Disposition: Home  Recommendations for Outpatient Follow-up:  1. Follow up with PCP in 1-2 weeks 2. Please obtain BMP/CBC in one week  Home Health: Home health RN, PT, social work Equipment/Devices:  Discharge Condition: Stable CODE STATUS: DNR Diet recommendation: Heart healthy  Brief/Interim Summary: 78 year old female admitted to the hospital with shortness of breath on exertion.  She was found to be in rapid atrial fibrillation and was admitted for further management.  Discharge Diagnoses:  Principal Problem:   Atrial fibrillation with RVR (HCC) Active Problems:   GERD (gastroesophageal reflux disease)   Essential hypertension with goal blood pressure less than 140/90   Hyperlipidemia   Cerebellar degeneration (HCC)   Depression   Hypothyroid   CKD (chronic kidney disease) stage 3, GFR 30-59 ml/min (HCC)  1. New onset atrial fibrillation with RVR. Recent echocardiogram from last month with preserved LV systolic function (no need to repeat study).  She was treated with Cardizem infusion and subsequently converted back to sinus rhythm. She was transitioned to Cardizem p.o. with stable heart rate.  Continue anticoagulation with Eliquis. Chads VAsc score is 4.   2. Urinary tract infection with sepsis, (end organ damage AKI)./ present on admission.  Urine culture positive for Enterobacter.  She had been receiving ceftriaxone, and did not have any fever or dysuria.  Interestingly, her urine culture showed Enterobacter that was resistant to ceftriaxone.  Other resistant antibiotics included cefazolin and Zosyn.  She did not have any significant leukocytosis.  I suspect the her urine culture may be a colonization.    Antibiotics were subsequently discontinued and she was monitored.  She did  not have any signs of persistent infection  3. AKI on CKD stage 3a with hypokalemia.  Reviewed with nephrology and it was felt that her episodes of hypotension were likely causing some degree of ATN.  Blood pressures are since stabilized and overall renal function is improving.  Renal ultrasound does not show any evidence of hydronephrosis.  Continue to hold home dose of lisinopril until she follows up with her primary care physician.  Creatinine is currently improved.  4. HTN with dyslipidemia.  Resume hydralazine on discharge.  Amlodipine discontinued in favor of Cardizem.  Lisinopril on hold due to renal failure.  Continue with statin therapy.   5. Hypothyroid. Continue with levothyroxine.   6. Chronic anemia. Multifactorial continue to follow cell count.   7. Spinocerebellar degeneration. Ambulatory dysfunction, seen by physical therapy with recommendations for home health therapy.  8.    Acute on chronic diastolic congestive heart failure.  She is chronically on Lasix.  This was held on admission due to elevated creatinine.    She began exhibiting signs of fluid overload with significant lower extremity edema.  She has been restarted on her home dose of Lasix.    And received several dose of IV Lasix.  She has had good urine output with diuretics.  Overall volume status is now improved and she appears to be approaching euvolemia.  9.  Right pleural effusion.  Noted on chest x-ray.  Likely related to CHF.  Restarted on diuretics.  Respiratory status improving  Discharge Instructions  Discharge Instructions    Diet - low sodium heart healthy   Complete by: As directed    Increase activity slowly   Complete by: As directed  Allergies as of 06/28/2019      Reactions   Sulfa Antibiotics Anaphylaxis   Facial swelling    Codeine Other (See Comments)   BLACK OUTS   Doxycycline Nausea Only   Erythromycin Swelling   Ibuprofen Other (See Comments)   Stomach pain, muscle spasm,  GI bleeding   Levaquin [levofloxacin] Other (See Comments)   Insomnia/trouble breathing   Penicillins Diarrhea   Has patient had a PCN reaction causing immediate rash, facial/tongue/throat swelling, SOB or lightheadedness with hypotension: No Has patient had a PCN reaction causing severe rash involving mucus membranes or skin necrosis: No Has patient had a PCN reaction that required hospitalization Unknown Has patient had a PCN reaction occurring within the last 10 years: No If all of the above answers are "NO", then may proceed with Cephalosporin use.      Medication List    STOP taking these medications   amLODipine 10 MG tablet Commonly known as: NORVASC   lisinopril 10 MG tablet Commonly known as: ZESTRIL     TAKE these medications   acetaminophen 325 MG tablet Commonly known as: TYLENOL Take 2 tablets (650 mg total) by mouth every 6 (six) hours as needed for mild pain, fever or headache (or Fever >/= 101).   Align Prebiotic-Probiotic 5-1.25 MG-GM Chew Chew 1 tablet by mouth daily.   ALPRAZolam 0.5 MG tablet Commonly known as: Xanax Take 1 tablet (0.5 mg total) by mouth 3 (three) times daily as needed for sleep or anxiety. What changed: how much to take   apixaban 5 MG Tabs tablet Commonly known as: ELIQUIS Take 1 tablet (5 mg total) by mouth 2 (two) times daily.   aspirin EC 81 MG tablet Take 1 tablet (81 mg total) by mouth daily with breakfast.   atorvastatin 40 MG tablet Commonly known as: LIPITOR Take 1 tablet (40 mg total) by mouth at bedtime. Do not restart until instructed by MD   CALTRATE 600+D PO Take 1 tablet by mouth daily.   cetirizine 10 MG chewable tablet Commonly known as: ZYRTEC Chew 10 mg by mouth daily.   diclofenac sodium 1 % Gel Commonly known as: VOLTAREN Apply 4 g topically 4 (four) times daily. (for arthritis to REPLACE oral diclofenac)   diltiazem 240 MG 24 hr capsule Commonly known as: CARDIZEM CD Take 1 capsule (240 mg total) by  mouth daily.   DULoxetine 60 MG capsule Commonly known as: CYMBALTA Take 1 capsule (60 mg total) by mouth daily. What changed: when to take this   famotidine 10 MG tablet Commonly known as: Pepcid AC Take 1 tablet (10 mg total) by mouth daily.   furosemide 20 MG tablet Commonly known as: LASIX Take 1 tablet (20 mg total) by mouth daily. For swelling/Fluid   hydrALAZINE 50 MG tablet Commonly known as: APRESOLINE Take 1 tablet (50 mg total) by mouth 3 (three) times daily. For BP   isosorbide mononitrate 30 MG 24 hr tablet Commonly known as: IMDUR Take 1 tablet (30 mg total) by mouth daily.   levothyroxine 50 MCG tablet Commonly known as: SYNTHROID TAKE 1 TABLET ONCE DAILY What changed:   how much to take  how to take this  when to take this  additional instructions   meclizine 25 MG tablet Commonly known as: ANTIVERT Take 25 mg by mouth 3 (three) times daily as needed for dizziness.   multivitamin with minerals Tabs tablet Take 1 tablet by mouth daily.   omeprazole 40 MG capsule Commonly known as:  PRILOSEC TAKE 1 CAPSULE DAILY What changed: when to take this   ondansetron 4 MG tablet Commonly known as: ZOFRAN Take 1 tablet (4 mg total) by mouth every 6 (six) hours as needed for nausea.   raloxifene 60 MG tablet Commonly known as: EVISTA Take 1 tablet (60 mg total) by mouth daily. What changed: when to take this      [place] Follow up.   Specialty: Home Health Services Why: PT/RN/SW/OT         Allergies  Allergen Reactions  . Sulfa Antibiotics Anaphylaxis    Facial swelling   . Codeine Other (See Comments)    BLACK OUTS  . Doxycycline Nausea Only  . Erythromycin Swelling  . Ibuprofen Other (See Comments)    Stomach pain, muscle spasm, GI bleeding  . Levaquin [Levofloxacin] Other (See Comments)    Insomnia/trouble breathing  . Penicillins Diarrhea    Has patient had a PCN reaction causing  immediate rash, facial/tongue/throat swelling, SOB or lightheadedness with hypotension: No Has patient had a PCN reaction causing severe rash involving mucus membranes or skin necrosis: No Has patient had a PCN reaction that required hospitalization Unknown Has patient had a PCN reaction occurring within the last 10 years: No If all of the above answers are "NO", then may proceed with Cephalosporin use.     Consultations:     Procedures/Studies: Dg Chest 2 View  Result Date: 06/07/2019 CLINICAL DATA:  Shortness of breath for 3 days EXAM: CHEST - 2 VIEW COMPARISON:  May 04, 2019 FINDINGS: The heart size is enlarged. The mediastinal contour is stable. Mild central pulmonary vascular congestion is identified unchanged. There is no focal pneumonia. Small bilateral posterior pleural effusions are identified. The bony structures are stable. IMPRESSION: Mild central pulmonary vascular congestion stable compared prior exam. No focal pneumonia. Small bilateral pleural effusions. Electronically Signed   By: Abelardo Diesel M.D.   On: 06/07/2019 15:08   Ct Angio Chest Pe W/cm &/or Wo Cm  Result Date: 06/07/2019 CLINICAL DATA:  Shortness of breath EXAM: CT ANGIOGRAPHY CHEST WITH CONTRAST TECHNIQUE: Multidetector CT imaging of the chest was performed using the standard protocol during bolus administration of intravenous contrast. Multiplanar CT image reconstructions and MIPs were obtained to evaluate the vascular anatomy. CONTRAST:  53m OMNIPAQUE IOHEXOL 350 MG/ML SOLN COMPARISON:  Chest radiograph June 07, 2019. Abdominal MRI May 27, 2019 FINDINGS: Cardiovascular: There is no demonstrable pulmonary embolus. Ascending thoracic aorta measures 4.0 x 4.0 cm in diameter. No dissection evident. There are scattered foci of calcification in the visualized great vessels. Note that the right innominate and left common carotid arteries arise as a common trunk, an anatomic variant. There is aortic  atherosclerosis as well as foci of coronary artery calcification. There is no pericardial effusion or pericardial thickening. Mediastinum/Nodes: There is a subcentimeter nodular area in the thyroid. Per consensus guidelines, a nodule of this size does not warrant additional imaging surveillance. There is no appreciable thoracic adenopathy. 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 Lungs/Pleura: There are moderate pleural effusions bilaterally. There is consolidation in each lung base, in part due to compressive atelectasis. No consolidation noted elsewhere. There is mild atelectatic change of the apex in the left upper lobe. Upper Abdomen: In the visualized upper abdomen, there is a cystic appearing mass within the anterior spleen which measures 3.0 x 2.7 cm. 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 There is aortic atherosclerosis in the upper abdominal aorta. Visualized upper abdominal structures otherwise appear unremarkable. Musculoskeletal: There is degenerative change in the thoracic spine. There are no blastic or lytic bone lesions. No chest wall lesions evident. Review of the MIP images confirms the above findings. IMPRESSION: 1.  No demonstrable pulmonary embolus. 2. Ascending thoracic aorta has a measured diameter of 4.0 x 4.0 cm. No evident dissection. There is aortic atherosclerosis as well as foci of coronary artery and great vessel calcification. Recommend annual imaging followup by CTA or MRA. This recommendation follows 2010 ACCF/AHA/AATS/ACR/ASA/SCA/SCAI/SIR/STS/SVM Guidelines for the Diagnosis and Management of Patients with Thoracic Aortic Disease. Circulation. 2010; 121: K160-F093. Aortic aneurysm NOS (ICD10-I71.9). 3. Moderate pleural effusions bilaterally with compressive atelectasis in each lung base. A degree of superimposed pneumonia in the bases cannot be excluded. 4.  Much of the  stomach is above the diaphragm. 5.  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 6.  No evident thoracic adenopathy. Aortic Atherosclerosis (ICD10-I70.0). Electronically Signed   By: Lowella Grip III M.D.   On: 06/07/2019 18:11   Ct Abdomen Pelvis W Contrast  Result Date: 05/31/2019 CLINICAL DATA:  Abdominal pain for 4 hours EXAM: CT ABDOMEN AND PELVIS WITH CONTRAST TECHNIQUE: Multidetector CT imaging of the abdomen and pelvis was performed using the standard protocol following bolus administration of intravenous contrast. CONTRAST:  34m OMNIPAQUE IOHEXOL 300 MG/ML  SOLN COMPARISON:  MRI May 27, 2019, CT May 04, 2019 FINDINGS: Lower chest: There is a large paraesophageal hernia present. There is mild cardiomegaly. The visualized portions of the lungs are clear. Hepatobiliary: The liver is normal in density without focal abnormality.The main portal vein is patent. The patient is status post cholecystectomy. No biliary ductal dilation. Again noted is mild dilatation of the common bile duct. Pancreas: Unremarkable. No pancreatic ductal dilatation or surrounding inflammatory changes. Spleen: Normal in size. There is an unchanged 3 cm low-density lesion seen within the splenic dome. Adrenals/Urinary Tract: Both adrenal glands appear normal. Low-density lesions seen within both kidneys, shown to be renal cysts on recent MRI. There is also a tiny fat containing lesion in the upper pole the left kidney, consistent with angiomyolipoma Stomach/Bowel: The small bowel is unremarkable. There is a focal segment of colon within the splenic flexure and descending colon that appears to have diffuse bowel wall thickening with surrounding mesenteric fat stranding changesscattered colonic diverticula are noted within the descending colon. There is a moderate amount of colonic stool. No pericolonic free fluid or Vascular/Lymphatic: There are no enlarged mesenteric, retroperitoneal, or pelvic lymph nodes. Scattered  aortic atherosclerotic calcifications are seen without aneurysmal dilatation. Reproductive: Again noted within the left adnexa a 4 cm low-density lesion which appears to be present and stable since April 2020. The lesion was present dating back to 2017, however minimally enlarged since that exam. Other: A small fat containing anterior umbilical hernia seen. Musculoskeletal: No acute or significant osseous findings. Chronic superior compression deformity of the L4 vertebral bodies seen with 50% loss in vertebral body height. IMPRESSION: 1. 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 No pericolonic abscess or free air. 2. 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 3. Large paraesophageal hernia. 4. Stable 4 cm left adnexal cyst. 5.  Aortic Atherosclerosis (ICD10-I70.0). Electronically Signed   By: BPrudencio PairM.D.   On: 05/31/2019 02:08   [place]Renal  Result Date: 06/24/2019 CLINICAL DATA:  Acute kidney injury. EXAM: RENAL /  URINARY TRACT ULTRASOUND COMPLETE COMPARISON:  May 31, 2019. FINDINGS: Right Kidney: Renal measurements: 11.6 x 5.3 x 4.3 cm = volume: 137 mL. 1.4 cm simple cyst is noted in upper pole. Echogenicity within normal limits. No mass or hydronephrosis visualized. Left Kidney: Renal measurements: 11.3 x 5.2 x 4.9 cm = volume: 150 mL. Echogenicity within normal limits. No mass or hydronephrosis visualized. Bladder: Appears normal for degree of bladder distention. Other: 5 cm simple cyst is seen in left lower quadrant which most likely corresponds to adnexal cyst seen on prior CT scan. IMPRESSION: No significant renal abnormality seen. 5 cm cyst seen in left lower quadrant which most likely corresponds to adnexal cyst seen on prior CT scan. Electronically Signed   By: Marijo Conception M.D.   On: 06/24/2019 11:26   Dg Chest Port 1 View  Result Date: 06/24/2019 CLINICAL DATA:  Shortness of breath. EXAM: PORTABLE CHEST 1 VIEW COMPARISON:   Chest radiograph 05/21/2019 FINDINGS: Stable mild cardiomegaly with pulmonary vascular congestion. Aortic atherosclerosis. Persistent small right pleural effusion. No definite left pleural effusion appreciated. No evidence of pneumothorax. Redemonstrated moderate to large hiatal hernia. No acute bony abnormality. Overlying monitoring leads. IMPRESSION: No significant interval change as compared to chest radiograph 06/21/2019. Cardiomegaly with pulmonary vascular congestion. Small right pleural effusion. 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 Electronically Signed   By: Kellie Simmering DO   On: 06/24/2019 12:43   Dg Chest Port 1 View  Result Date: 06/21/2019 CLINICAL DATA:  Cough and short of breath EXAM: PORTABLE CHEST 1 VIEW COMPARISON:  06/07/2019 FINDINGS: Mild cardiomegaly with central vascular congestion. Small pleural effusions. 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 Aortic atherosclerosis. IMPRESSION: 1. Cardiomegaly with central vascular congestion and small pleural effusions. 2. 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 Electronically Signed   By: Donavan Foil M.D.   On: 06/21/2019 19:44       Subjective: No shortness of breath or chest pain.  Feeling better.  Discharge Exam: Vitals:   06/27/19 2111 06/28/19 0449 06/28/19 0500 06/28/19 1334  BP: (!) 155/82 (!) 175/95  (!) 159/85  Pulse: 97 91  93  Resp: 16 18  20   Temp: 98.3 F (36.8 C) 98.2 F (36.8 C)  98.7 F (37.1 C)  TempSrc: Oral Oral  Oral  SpO2: 94% 95%  96%  Weight:   84.6 kg   Height:        General: Pt is alert, awake, not in acute distress Cardiovascular: RRR, S1/S2 +, no rubs, no gallops Respiratory: CTA bilaterally, no wheezing, no rhonchi Abdominal: Soft, NT, ND, bowel sounds + Extremities: no edema, no cyanosis    The results of significant diagnostics from this hospitalization (including imaging, microbiology, ancillary and laboratory) are listed below for reference.     Microbiology: Recent Results (from the past 240  hour(s))  SARS CORONAVIRUS 2 (TAT 6-24 HRS) Nasopharyngeal Nasopharyngeal Swab     Status: None   Collection Time: 06/21/19  6:43 PM   Specimen: Nasopharyngeal Swab  Result Value Ref Range Status   SARS Coronavirus 2 NEGATIVE NEGATIVE Final    Comment: (NOTE) SARS-CoV-2 target nucleic acids are NOT DETECTED. The SARS-CoV-2 RNA is generally detectable in upper and lower respiratory specimens during the acute phase of infection. Negative results do not preclude SARS-CoV-2 infection, do not rule out co-infections with other pathogens, and should not be used as the sole basis for treatment or other patient management decisions. Negative results must be combined with clinical observations, patient history, and epidemiological information.  The expected result is Negative. Fact Sheet for Patients: SugarRoll.be Fact Sheet for Healthcare Providers: https://www.woods-mathews.com/ This test is not yet approved or cleared by the [place] FDA and  has been authorized for detection and/or diagnosis of SARS-CoV-2 by FDA under an Emergency Use Authorization (EUA). This EUA will remain  in effect (meaning this test can be used) for the duration of the COVID-19 declaration under Section 56 4(b)(1) of the Act, 21 U.S.C. section 360bbb-3(b)(1), unless the authorization is terminated or revoked sooner. Performed at [facility], [place] [address], [place], [place] [address]   Blood Culture (routine x 2)     Status: None   Collection Time: 06/21/19  6:58 PM   Specimen: Left Antecubital; Blood  Result Value Ref Range Status   Specimen Description LEFT ANTECUBITAL  Final   Special Requests   Final    BOTTLES DRAWN AEROBIC AND ANAEROBIC Blood Culture adequate volume   Culture   Final    NO GROWTH 5 DAYS Performed at [facility], [address] [place], [place] [address]    Report Status 06/26/2019 FINAL  Final  Blood Culture (routine x 2)     Status:  None   Collection Time: 06/21/19  6:59 PM   Specimen: BLOOD LEFT FOREARM  Result Value Ref Range Status   Specimen Description BLOOD LEFT FOREARM  Final   Special Requests   Final    BOTTLES DRAWN AEROBIC AND ANAEROBIC Blood Culture adequate volume   Culture   Final    NO GROWTH 5 DAYS Performed at [facility], [address] [place], [place] [address]    Report Status 06/26/2019 FINAL  Final  Urine culture     Status: Abnormal   Collection Time: 06/21/19  7:03 PM   Specimen: In/Out Cath Urine  Result Value Ref Range Status   Specimen Description   Final    IN/OUT CATH URINE Performed at [facility], [address] [place], [place] [address]    Special Requests   Final    NONE Performed at [facility], [address] [place], [place] [address]    Culture >=100,000 COLONIES/mL ENTEROBACTER CLOACAE (A)  Final   Report Status 06/25/2019 FINAL  Final   Organism ID, Bacteria ENTEROBACTER CLOACAE (A)  Final      Susceptibility   Enterobacter cloacae - MIC*    CEFAZOLIN >=64 RESISTANT Resistant     CEFTRIAXONE >=64 RESISTANT Resistant     CIPROFLOXACIN <=0.25 SENSITIVE Sensitive     GENTAMICIN <=1 SENSITIVE Sensitive     IMIPENEM 1 SENSITIVE Sensitive     NITROFURANTOIN 32 SENSITIVE Sensitive     TRIMETH/SULFA <=20 SENSITIVE Sensitive     PIP/TAZO >=128 RESISTANT Resistant     * >=100,000 COLONIES/mL ENTEROBACTER CLOACAE  MRSA PCR Screening     Status: None   Collection Time: 06/22/19  4:23 PM   Specimen: Nasopharyngeal  Result Value Ref Range Status   MRSA by PCR NEGATIVE NEGATIVE Final    Comment:        The GeneXpert MRSA Assay (FDA approved for NASAL specimens only), is one component of a comprehensive MRSA colonization surveillance program. It is not intended to diagnose MRSA infection nor to guide or monitor treatment for MRSA infections. Performed at [facility], [address], [place],  [address]      Labs: BNP (last 3 results) Recent Labs     06/07/19 1530  BNP 09.3   Basic Metabolic Panel: Recent Labs  Lab 06/23/19 0441 06/24/19 0405 06/25/19 0455 06/26/19 0828 06/27/19 0513 06/28/19 0622  NA 141 139 141 140  --  142  K 3.2* 3.8 3.7 3.6  --  3.7  CL 104 106 106 106  --  96*  CO2 22 21* 24 25  --  33*  GLUCOSE 119* 125* 136* 129*  --  141*  BUN 36* 40* 35* 30*  --  18  CREATININE 1.79* 2.12* 1.59* 1.26* 1.11* 1.22*  CALCIUM 7.8* 7.6* 8.0* 8.4*  --  9.2  MG  --  1.6*  --   --   --  1.4*   Liver Function Tests: No results for input(s): AST, ALT, ALKPHOS, BILITOT, PROT, ALBUMIN in the last 168 hours. No results for input(s): LIPASE, AMYLASE in the last 168 hours. No results for input(s): AMMONIA in the last 168 hours. CBC: Recent Labs  Lab 06/23/19 0441 06/24/19 0405 06/25/19 0455 06/26/19 0828 06/27/19 0513 06/28/19 0622  WBC 10.2 6.8 7.3 9.9 8.1 7.9  NEUTROABS 7.9*  --   --   --   --   --   HGB 8.8* 9.1* 9.3* 10.0* 9.8* 10.7*  HCT 28.8* 29.7* 30.6* 32.6* 31.2* 34.7*  MCV 98.3 96.1 97.8 98.2 96.3 95.9  PLT 221 219 224 247 221 240   Cardiac Enzymes: No results for input(s): CKTOTAL, CKMB, CKMBINDEX, TROPONINI in the last 168 hours. BNP: Invalid input(s): POCBNP CBG: No results for input(s): GLUCAP in the last 168 hours. D-Dimer No results for input(s): DDIMER in the last 72 hours. Hgb A1c No results for input(s): HGBA1C in the last 72 hours. Lipid Profile No results for input(s): CHOL, HDL, LDLCALC, TRIG, CHOLHDL, LDLDIRECT in the last 72 hours. Thyroid function studies No results for input(s): TSH, T4TOTAL, T3FREE, THYROIDAB in the last 72 hours.  Invalid input(s): FREET3 Anemia work up No results for input(s): VITAMINB12, FOLATE, FERRITIN, TIBC, IRON, RETICCTPCT in the last 72 hours. Urinalysis    Component Value Date/Time   COLORURINE AMBER (A) 06/21/2019 1903   APPEARANCEUR CLOUDY (A) 06/21/2019 1903   APPEARANCEUR Cloudy (A) 01/25/2019 1425   LABSPEC 1.015 06/21/2019 1903   PHURINE 5.0  06/21/2019 1903   GLUCOSEU NEGATIVE 06/21/2019 [place] 06/21/2019 [place] 06/21/2019 1903   BILIRUBINUR Negative 01/25/2019 [place] 06/21/2019 1903   PROTEINUR 100 (A) 06/21/2019 1903   UROBILINOGEN negative 09/06/2015 1155   UROBILINOGEN 0.2 03/25/2014 1359   NITRITE POSITIVE (A) 06/21/2019 1903   LEUKOCYTESUR MODERATE (A) 06/21/2019 1903   Sepsis Labs Invalid input(s): PROCALCITONIN,  WBC,  LACTICIDVEN Microbiology Recent Results (from the past 240 hour(s))  SARS CORONAVIRUS 2 (TAT 6-24 HRS) Nasopharyngeal Nasopharyngeal Swab     Status: None   Collection Time: 06/21/19  6:43 PM   Specimen: Nasopharyngeal Swab  Result Value Ref Range Status   SARS Coronavirus 2 NEGATIVE NEGATIVE Final    Comment: (NOTE) SARS-CoV-2 target nucleic acids are NOT DETECTED. The SARS-CoV-2 RNA is generally detectable in upper and lower respiratory specimens during the acute phase of infection. Negative results do not preclude SARS-CoV-2 infection, do not rule out co-infections with other pathogens, and should not be used as the sole basis for treatment or other patient management decisions. Negative results must be combined with clinical observations, patient history, and epidemiological information. The expected result is Negative. Fact Sheet for Patients: SugarRoll.be Fact Sheet for Healthcare Providers: https://www.woods-mathews.com/ This test is not yet approved or cleared by the [place]  States FDA and  has been authorized for detection and/or diagnosis of SARS-CoV-2 by FDA under an Emergency Use Authorization (EUA). This EUA will remain  in effect (meaning this test can be used) for the duration of the COVID-19 declaration under Section 56 4(b)(1) of the Act, 21 U.S.C. section 360bbb-3(b)(1), unless the authorization is terminated or revoked sooner. Performed at [facility], [place] [address], [place], [place] [address]   Blood Culture (routine x 2)     Status: None   Collection Time: 06/21/19  6:58 PM   Specimen: Left Antecubital; Blood  Result Value Ref Range Status   Specimen Description LEFT ANTECUBITAL  Final   Special Requests   Final    BOTTLES DRAWN AEROBIC AND ANAEROBIC Blood Culture adequate volume   Culture   Final    NO GROWTH 5 DAYS Performed at [facility], [address] [place], [place] [address]    Report Status 06/26/2019 FINAL  Final  Blood Culture (routine x 2)     Status: None   Collection Time: 06/21/19  6:59 PM   Specimen: BLOOD LEFT FOREARM  Result Value Ref Range Status   Specimen Description BLOOD LEFT FOREARM  Final   Special Requests   Final    BOTTLES DRAWN AEROBIC AND ANAEROBIC Blood Culture adequate volume   Culture   Final    NO GROWTH 5 DAYS Performed at [facility], [address], [place], [place] [address]    Report Status 06/26/2019 FINAL  Final  Urine culture     Status: Abnormal   Collection Time: 06/21/19  7:03 PM   Specimen: In/Out Cath Urine  Result Value Ref Range Status   Specimen Description   Final    IN/OUT CATH URINE Performed at [facility], [address] [place], [place] [address]    Special Requests   Final    NONE Performed at [facility], [address] [place], [place] [address]    Culture >=100,000 COLONIES/mL ENTEROBACTER CLOACAE (A)  Final   Report Status 06/25/2019 FINAL  Final   Organism ID, Bacteria ENTEROBACTER CLOACAE (A)  Final      Susceptibility   Enterobacter cloacae - MIC*    CEFAZOLIN >=64 RESISTANT Resistant     CEFTRIAXONE >=64 RESISTANT Resistant     CIPROFLOXACIN <=0.25 SENSITIVE Sensitive     GENTAMICIN <=1 SENSITIVE Sensitive     IMIPENEM 1 SENSITIVE Sensitive     NITROFURANTOIN 32 SENSITIVE Sensitive     TRIMETH/SULFA <=20 SENSITIVE Sensitive     PIP/TAZO >=128 RESISTANT Resistant     * >=100,000 COLONIES/mL ENTEROBACTER CLOACAE  MRSA PCR Screening     Status: None    Collection Time: 06/22/19  4:23 PM   Specimen: Nasopharyngeal  Result Value Ref Range Status   MRSA by PCR NEGATIVE NEGATIVE Final    Comment:        The GeneXpert MRSA Assay (FDA approved for NASAL specimens only), is one component of a comprehensive MRSA colonization surveillance program. It is not intended to diagnose MRSA infection nor to guide or monitor treatment for MRSA infections. Performed at [facility], [address], [place],  [address]      Time coordinating discharge: 63mns  SIGNED:   JKathie Dike MD  [facility] 06/28/2019, 10:40 PM   If 7PM-7AM, please contact night-coverage [email or web address]

## 2019-06-29 ENCOUNTER — Other Ambulatory Visit (HOSPITAL_COMMUNITY): Payer: Medicare Other

## 2019-06-29 ENCOUNTER — Ambulatory Visit: Payer: Self-pay | Admitting: *Deleted

## 2019-06-29 ENCOUNTER — Encounter (HOSPITAL_COMMUNITY): Payer: Self-pay | Admitting: Emergency Medicine

## 2019-06-29 ENCOUNTER — Telehealth: Payer: Self-pay | Admitting: Family Medicine

## 2019-06-29 ENCOUNTER — Other Ambulatory Visit: Payer: Self-pay

## 2019-06-29 ENCOUNTER — Emergency Department (HOSPITAL_COMMUNITY): Payer: Medicare Other

## 2019-06-29 ENCOUNTER — Emergency Department (HOSPITAL_COMMUNITY)
Admission: EM | Admit: 2019-06-29 | Discharge: 2019-07-01 | Disposition: A | Discharge status: Home / Self Care | Payer: Medicare Other | Attending: Emergency Medicine | Admitting: Emergency Medicine

## 2019-06-29 ENCOUNTER — Telehealth: Payer: Self-pay | Admitting: *Deleted

## 2019-06-29 DIAGNOSIS — R4020 Unspecified coma: Secondary | ICD-10-CM | POA: Diagnosis not present

## 2019-06-29 DIAGNOSIS — Z20828 Contact with and (suspected) exposure to other viral communicable diseases: Secondary | ICD-10-CM | POA: Insufficient documentation

## 2019-06-29 DIAGNOSIS — K449 Diaphragmatic hernia without obstruction or gangrene: Secondary | ICD-10-CM | POA: Diagnosis not present

## 2019-06-29 DIAGNOSIS — Z7982 Long term (current) use of aspirin: Secondary | ICD-10-CM | POA: Diagnosis not present

## 2019-06-29 DIAGNOSIS — R000 Tachycardia, unspecified: Secondary | ICD-10-CM | POA: Diagnosis not present

## 2019-06-29 DIAGNOSIS — M6281 Muscle weakness (generalized): Secondary | ICD-10-CM | POA: Diagnosis not present

## 2019-06-29 DIAGNOSIS — R2681 Unsteadiness on feet: Secondary | ICD-10-CM | POA: Insufficient documentation

## 2019-06-29 DIAGNOSIS — R531 Weakness: Secondary | ICD-10-CM | POA: Diagnosis not present

## 2019-06-29 DIAGNOSIS — Z79899 Other long term (current) drug therapy: Secondary | ICD-10-CM | POA: Diagnosis not present

## 2019-06-29 DIAGNOSIS — I129 Hypertensive chronic kidney disease with stage 1 through stage 4 chronic kidney disease, or unspecified chronic kidney disease: Secondary | ICD-10-CM | POA: Insufficient documentation

## 2019-06-29 DIAGNOSIS — R109 Unspecified abdominal pain: Secondary | ICD-10-CM | POA: Insufficient documentation

## 2019-06-29 DIAGNOSIS — N189 Chronic kidney disease, unspecified: Secondary | ICD-10-CM | POA: Insufficient documentation

## 2019-06-29 DIAGNOSIS — R0602 Shortness of breath: Secondary | ICD-10-CM | POA: Diagnosis not present

## 2019-06-29 LAB — CBC WITH DIFFERENTIAL/PLATELET
Abs Immature Granulocytes: 0.1 10*3/uL — ABNORMAL HIGH (ref 0.00–0.07)
Basophils Absolute: 0.1 10*3/uL (ref 0.0–0.1)
Basophils Relative: 1 %
Eosinophils Absolute: 0.1 10*3/uL (ref 0.0–0.5)
Eosinophils Relative: 1 %
HCT: 38.2 % (ref 36.0–46.0)
Hemoglobin: 11.9 g/dL — ABNORMAL LOW (ref 12.0–15.0)
Immature Granulocytes: 1 %
Lymphocytes Relative: 13 %
Lymphs Abs: 1.3 10*3/uL (ref 0.7–4.0)
MCH: 29.9 pg (ref 26.0–34.0)
MCHC: 31.2 g/dL (ref 30.0–36.0)
MCV: 96 fL (ref 80.0–100.0)
Monocytes Absolute: 1.4 10*3/uL — ABNORMAL HIGH (ref 0.1–1.0)
Monocytes Relative: 14 %
Neutro Abs: 7.2 10*3/uL (ref 1.7–7.7)
Neutrophils Relative %: 70 %
Platelets: 270 10*3/uL (ref 150–400)
RBC: 3.98 MIL/uL (ref 3.87–5.11)
RDW: 15.1 % (ref 11.5–15.5)
WBC: 10.2 10*3/uL (ref 4.0–10.5)
nRBC: 0 % (ref 0.0–0.2)

## 2019-06-29 LAB — COMPREHENSIVE METABOLIC PANEL
ALT: 48 U/L — ABNORMAL HIGH (ref 0–44)
AST: 41 U/L (ref 15–41)
Albumin: 3.3 g/dL — ABNORMAL LOW (ref 3.5–5.0)
Alkaline Phosphatase: 85 U/L (ref 38–126)
Anion gap: 13 (ref 5–15)
BUN: 18 mg/dL (ref 8–23)
CO2: 32 mmol/L (ref 22–32)
Calcium: 9.2 mg/dL (ref 8.9–10.3)
Chloride: 96 mmol/L — ABNORMAL LOW (ref 98–111)
Creatinine, Ser: 1.33 mg/dL — ABNORMAL HIGH (ref 0.44–1.00)
GFR calc Af Amer: 44 mL/min — ABNORMAL LOW (ref >60)
GFR calc non Af Amer: 38 mL/min — ABNORMAL LOW (ref >60)
Glucose, Bld: 128 mg/dL — ABNORMAL HIGH (ref 70–99)
Potassium: 3.7 mmol/L (ref 3.5–5.1)
Sodium: 141 mmol/L (ref 135–145)
Total Bilirubin: 0.6 mg/dL (ref 0.3–1.2)
Total Protein: 6.7 g/dL (ref 6.5–8.1)

## 2019-06-29 MED ORDER — DILTIAZEM HCL ER 120 MG PO CP24
240.0000 mg | ORAL_CAPSULE | Freq: Every day | ORAL | Status: DC
  Administered 2019-06-29 – 2019-07-01 (×3): 240 mg via ORAL
  Filled 2019-06-29 (×9): qty 2

## 2019-06-29 MED ORDER — ISOSORBIDE MONONITRATE ER 60 MG PO TB24
30.0000 mg | ORAL_TABLET | Freq: Every day | ORAL | Status: DC
  Administered 2019-06-29 – 2019-07-01 (×3): 30 mg via ORAL
  Filled 2019-06-29 (×9): qty 1

## 2019-06-29 MED ORDER — FUROSEMIDE 40 MG PO TABS
20.0000 mg | ORAL_TABLET | Freq: Every day | ORAL | Status: DC
  Administered 2019-06-29 – 2019-07-01 (×3): 20 mg via ORAL
  Filled 2019-06-29 (×3): qty 1

## 2019-06-29 MED ORDER — APIXABAN 5 MG PO TABS
5.0000 mg | ORAL_TABLET | Freq: Two times a day (BID) | ORAL | Status: DC
  Administered 2019-06-29 – 2019-07-01 (×3): 5 mg via ORAL
  Filled 2019-06-29 (×3): qty 1

## 2019-06-29 MED ORDER — HYDRALAZINE HCL 25 MG PO TABS
50.0000 mg | ORAL_TABLET | Freq: Three times a day (TID) | ORAL | Status: DC
  Administered 2019-06-29 – 2019-07-01 (×4): 50 mg via ORAL
  Filled 2019-06-29 (×4): qty 2

## 2019-06-29 MED ORDER — FAMOTIDINE 20 MG PO TABS
10.0000 mg | ORAL_TABLET | Freq: Every day | ORAL | Status: DC
  Administered 2019-06-29 – 2019-07-01 (×3): 10 mg via ORAL
  Filled 2019-06-29 (×3): qty 1

## 2019-06-29 MED ORDER — PANTOPRAZOLE SODIUM 40 MG PO TBEC
40.0000 mg | DELAYED_RELEASE_TABLET | Freq: Every day | ORAL | Status: DC
  Administered 2019-06-29 – 2019-07-01 (×3): 40 mg via ORAL
  Filled 2019-06-29 (×3): qty 1

## 2019-06-29 MED ORDER — DULOXETINE HCL 30 MG PO CPEP
60.0000 mg | ORAL_CAPSULE | Freq: Every day | ORAL | Status: DC
  Administered 2019-06-29 – 2019-07-01 (×3): 60 mg via ORAL
  Filled 2019-06-29 (×3): qty 2

## 2019-06-29 MED ORDER — ACETAMINOPHEN 325 MG PO TABS
650.0000 mg | ORAL_TABLET | Freq: Four times a day (QID) | ORAL | Status: DC | PRN
  Administered 2019-06-30: 650 mg via ORAL
  Filled 2019-06-29: qty 2

## 2019-06-29 MED ORDER — RALOXIFENE HCL 60 MG PO TABS
60.0000 mg | ORAL_TABLET | Freq: Every day | ORAL | Status: DC
  Administered 2019-06-29 – 2019-07-01 (×3): 60 mg via ORAL
  Filled 2019-06-29 (×8): qty 1

## 2019-06-29 MED ORDER — ALPRAZOLAM 0.5 MG PO TABS
0.2500 mg | ORAL_TABLET | Freq: Three times a day (TID) | ORAL | Status: DC | PRN
  Administered 2019-06-30: 0.5 mg via ORAL
  Administered 2019-07-01: 0.25 mg via ORAL
  Filled 2019-06-29 (×2): qty 1

## 2019-06-29 MED ORDER — LEVOTHYROXINE SODIUM 50 MCG PO TABS
50.0000 ug | ORAL_TABLET | Freq: Every day | ORAL | Status: DC
  Administered 2019-06-30 – 2019-07-01 (×2): 50 ug via ORAL
  Filled 2019-06-29 (×3): qty 1

## 2019-06-29 NOTE — NC FL2 (Signed)
[place] LEVEL OF CARE SCREENING TOOL     IDENTIFICATION  Patient Name: Tamara Stein Birthdate: 22-Dec-1940 Sex: female Admission Date (Current Location): 06/29/2019  [facility] and [place] Number:  [employer] and Address:  [place] [address] [place]      Provider Number: [id number]  Attending Physician Name and Address:  Ezequiel Essex, MD  Relative Name and Phone Number:       Current Level of Care: Hospital Recommended Level of Care: [place] Prior Approval Number:    Date Approved/Denied:   PASRR Number: [id number]  Discharge Plan: SNF    Current Diagnoses: Patient Active Problem List   Diagnosis Date Noted  . Atrial fibrillation with RVR ([place]) 06/22/2019  . Sepsis ([place]) 06/21/2019  . Ascending aortic aneurysm ([place]) 06/13/2019  . Pleural effusion 06/08/2019  . Dyspnea 06/07/2019  . Diarrhea   . Abnormal LFTs   . Symptomatic anemia 06/01/2019  . Nausea vomiting and diarrhea   . Lesion of spleen   . Diverticulitis 05/04/2019  . Internal impingement of left shoulder 12/24/2017  . Anxiety 04/20/2017  . 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 Incontinence of urine 04/20/2017  . UTI (urinary tract infection) 07/15/2016  . Abdominal pain 07/15/2016  . Colitis 07/15/2016  . Syncope and collapse 04/26/2016  . Lower urinary tract infectious disease 04/26/2016  . Fall   . Daytime somnolence 03/10/2016  . Diplopia 03/10/2016  . Insomnia 03/10/2016  . Sleep apnea in adult 03/10/2016  . Imbalance 03/07/2016  . Migraine 03/07/2016  . Tremor 03/07/2016  . Pain of both shoulder joints 02/29/2016  . Arthritis 09/24/2015  . Constipation 06/05/2015  . Depression 06/05/2015  . Hypothyroid 06/05/2015  . 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 GERD (gastroesophageal reflux disease)   . Essential hypertension with goal blood pressure less than 140/90   . Hyperlipidemia   . 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 CKD (chronic kidney disease) stage 3, GFR 30-59 ml/min (HCC) 12/02/2013    Orientation RESPIRATION BLADDER Height & Weight     Self, Time, Situation, Place  Normal Continent Weight: 186 lb 8.2 oz (84.6 kg) Height:  5' 2"  (157.5 cm)  BEHAVIORAL SYMPTOMS/MOOD NEUROLOGICAL BOWEL NUTRITION STATUS      Continent Diet(see dc summary)  AMBULATORY STATUS COMMUNICATION OF NEEDS Skin   Extensive Assist Verbally Normal                       Personal Care Assistance Level of Assistance  Bathing, Feeding, Dressing Bathing Assistance: Limited assistance Feeding assistance: Independent Dressing Assistance: Limited assistance     Functional Limitations Info  Sight, Hearing, Speech Sight Info: Adequate Hearing Info: Adequate Speech Info: Adequate    SPECIAL CARE FACTORS FREQUENCY  PT (By licensed PT), OT (By licensed OT)     PT Frequency: 5 times week OT Frequency: 3 times week            Contractures Contractures Info: Not present    Additional Factors Info  Code Status, Allergies, Psychotropic Code Status Info: full Allergies Info: Sulfa Antibiotics, Doxycycline, Erythromycin, Ibuprofen, Levaquin, Penicillins Psychotropic Info: Cymbalta, Xanax         Current Medications (06/29/2019):  This is the current hospital active medication list No current facility-administered medications for this encounter.    Current Outpatient Medications  Medication Sig Dispense Refill  . acetaminophen (TYLENOL) 325 MG tablet Take 2 tablets (650 mg total) by mouth every 6 (  six) hours as needed for mild pain, fever or headache (or Fever >/= 101). 120 tablet 2  . ALPRAZolam (XANAX) 0.5 MG tablet Take 1 tablet (0.5 mg total) by mouth 3 (three) times daily as needed for sleep or anxiety. (Patient taking differently: Take 0.25-0.5 mg by mouth 3 (three) times daily as needed for sleep or anxiety. ) 15 tablet 0  . apixaban (ELIQUIS) 5 MG TABS tablet Take 1 tablet (5 mg  total) by mouth 2 (two) times daily. 60 tablet 0  . aspirin EC 81 MG tablet Take 1 tablet (81 mg total) by mouth daily with breakfast. 120 tablet 2  . atorvastatin (LIPITOR) 40 MG tablet Take 1 tablet (40 mg total) by mouth at bedtime. Do not restart until instructed by MD (Patient not taking: Reported on 06/21/2019) 90 tablet 0  . Bacillus Coagulans-Inulin (ALIGN PREBIOTIC-PROBIOTIC) 5-1.25 MG-GM CHEW Chew 1 tablet by mouth daily. 30 tablet 0  . Calcium Carbonate-Vitamin D (CALTRATE 600+D PO) Take 1 tablet by mouth daily.    . cetirizine (ZYRTEC) 10 MG chewable tablet Chew 10 mg by mouth daily.    . diclofenac sodium (VOLTAREN) 1 % GEL Apply 4 g topically 4 (four) times daily. (for arthritis to REPLACE oral diclofenac) 200 g 0  . diltiazem (CARDIZEM CD) 240 MG 24 hr capsule Take 1 capsule (240 mg total) by mouth daily. 30 capsule 0  . DULoxetine (CYMBALTA) 60 MG capsule Take 1 capsule (60 mg total) by mouth daily. (Patient taking differently: Take 60 mg by mouth every morning. ) 90 capsule 0  . famotidine (PEPCID AC) 10 MG tablet Take 1 tablet (10 mg total) by mouth daily. 30 tablet 0  . furosemide (LASIX) 20 MG tablet Take 1 tablet (20 mg total) by mouth daily. For swelling/Fluid 30 tablet 1  . hydrALAZINE (APRESOLINE) 50 MG tablet Take 1 tablet (50 mg total) by mouth 3 (three) times daily. For BP 90 tablet 3  . isosorbide mononitrate (IMDUR) 30 MG 24 hr tablet Take 1 tablet (30 mg total) by mouth daily. 30 tablet 3  . levothyroxine (SYNTHROID) 50 MCG tablet TAKE 1 TABLET ONCE DAILY (Patient taking differently: Take 50 mcg by mouth daily before breakfast. ) 90 tablet 1  . meclizine (ANTIVERT) 25 MG tablet Take 25 mg by mouth 3 (three) times daily as needed for dizziness.    . Multiple Vitamin (MULTIVITAMIN WITH MINERALS) TABS tablet Take 1 tablet by mouth daily.    Marland Kitchen omeprazole (PRILOSEC) 40 MG capsule TAKE 1 CAPSULE DAILY (Patient taking differently: Take 40 mg by mouth daily before breakfast. )  90 capsule 1  . ondansetron (ZOFRAN) 4 MG tablet Take 1 tablet (4 mg total) by mouth every 6 (six) hours as needed for nausea. 20 tablet 0  . raloxifene (EVISTA) 60 MG tablet Take 1 tablet (60 mg total) by mouth daily. (Patient taking differently: Take 60 mg by mouth every morning. ) 90 tablet 1     Discharge Medications: Please see discharge summary for a list of discharge medications.  Relevant Imaging Results:  Relevant Lab Results:   Additional Information SSN: [id number], [place]

## 2019-06-29 NOTE — ED Triage Notes (Signed)
PT brought in by [facility] today from sister/brother-in-law's house. Family told EMS pt was discharged from hospital yesterday evening and they can't care for the patient due to her generalized worsening weakness. PT requesting nursing home placement on arrival to ED.

## 2019-06-29 NOTE — Chronic Care Management (AMB) (Signed)
Care Management   Note  06/29/2019 Name: Tamara Stein MRN: [id number] DOB: 1940-11-13  Consulted by PCP, Dr Lajuana Ripple, regarding placement in SNF. Patient was recently discharged from hospital and has had four admissions since 04/2019. I talked with PCP clinical staff and patient was being referred back to ED due to report from [facility] nurse of hypoxia and tachycardia.    Follow up plan: CCM team will remain available if assistance is needed in the future  Chong Sicilian, BSN, RN-BC [place] [facility] [place] Management Direct Dial: [phone]

## 2019-06-29 NOTE — TOC Progression Note (Signed)
TOC consulted regarding patient who just discharged from [facility] yesterday. Pt returned home to her previous living arrangement with her sister and brother in law. [facility] was arranged to follow pt at home.   At this time, pt stating she needs SNF placement. Pt will need new negative COVID test result and a new PT consult.   Spoke with pt's brother in law by phone and he confirms that they are interested in SNF placement. He states pt and family are interested in [facility] as their first choice.  Updated ED RN. Will follow up tomorrow once PT eval is complete to make referrals.

## 2019-06-29 NOTE — Telephone Encounter (Signed)
Tamara Stein called stating that he is a pt's Home Health Physical Therapist. Mr Tamara Stein went to see patient for therapy and said that pt is in bad shape and refused therapy. Mr Tamara Stein said that he was told that we were in the process of getting her referred to a nursing home.. Wanted to speak with someone about this.

## 2019-06-29 NOTE — Telephone Encounter (Signed)
Agree.  I think that this patient likely needs placement. I worry about her deterioration over the last several months and recurrent need for hospitalization/ advanced medical care.  Would recommend at minimum consideration for placement in a short term if not long term nursing facility.

## 2019-06-29 NOTE — Telephone Encounter (Signed)
TC from [place] w/ [facility] Pt very tearful today near syncope spell, couldn't get back in bed BP 110/82 HR 110 O2 93. Recommended pt going back to the hospital. [facility] nurse talking with family to call EMS for transport.

## 2019-06-29 NOTE — ED Provider Notes (Signed)
[facility] EMERGENCY DEPARTMENT Provider Note   CSN: [id number] Arrival date & time: 06/29/19  1547     History   Chief Complaint Chief Complaint  Patient presents with   PT requesting SNF placement    HPI Tamara Stein is a 78 y.o. female.     Patient brought in by EMS with generalized weakness since yesterday.  She was brought in by EMS.  She was discharged from the hospital yesterday after a weeklong stay for generalized weakness, UTI with sepsis and atrial fibrillation with AVR and AKI.  Patient initially declined to go to a nursing facility but is now requesting to go facility for placement.  She states she feels generally weak and cannot take care of herself.  Denies any falls or trauma.  She complains of some vague abdominal pain with intermittent nausea.  She does not think she is taking any antibiotics any longer.  She denies any chest pain or shortness of breath.  She denies any cough or fever.  Chart review shows admission to the hospital on October 27 until October 29 for pneumonia with pleural effusions.  She was also admitted for colitis prior to that.  Patient admitted to the hospital November 10 and discharged on November 17 and she was treated for UTI as well as AKI and A. fib.  The history is provided by the EMS personnel and the patient.    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[place])    Chronic kidney disease    Chronic knee pain    Depression    GERD (gastroesophageal reflux disease)    Hyperlipidemia    Hypertension    Thyroid disease     Patient Active Problem List   Diagnosis Date Noted   Atrial fibrillation with RVR ([place]) 06/22/2019   Sepsis ([place]) 06/21/2019   Ascending aortic aneurysm (HCC) 06/13/2019   Pleural effusion 06/08/2019   Dyspnea 06/07/2019   Diarrhea    Abnormal LFTs    Symptomatic anemia 06/01/2019   Nausea vomiting and diarrhea    Lesion  of spleen    Diverticulitis 05/04/2019   Internal impingement of left shoulder 12/24/2017   Anxiety 04/20/2017   Cerebellar dysfunction 04/20/2017   Incontinence of urine 04/20/2017   UTI (urinary tract infection) 07/15/2016   Abdominal pain 07/15/2016   Colitis 07/15/2016   Syncope and collapse 04/26/2016   Lower urinary tract infectious disease 04/26/2016   Fall    Daytime somnolence 03/10/2016   Diplopia 03/10/2016   Insomnia 03/10/2016   Sleep apnea in adult 03/10/2016   Imbalance 03/07/2016   Migraine 03/07/2016   Tremor 03/07/2016   Pain of both shoulder joints 02/29/2016   Arthritis 09/24/2015   Constipation 06/05/2015   Depression 06/05/2015   Hypothyroid 06/05/2015   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      Home Medications    Prior to Admission medications   Medication Sig Start Date End Date Taking? Authorizing Provider  acetaminophen (TYLENOL) 325 MG tablet Take 2 tablets (650 mg total) by mouth every 6 (six) hours as needed for mild pain, fever or headache (or Fever >/= 101). 06/05/19   Roxan Hockey, MD  ALPRAZolam Duanne Moron) 0.5 MG tablet Take 1 tablet (0.5 mg total) by mouth 3 (three) times daily as needed for sleep or anxiety. Patient taking differently: Take 0.25-0.5 mg by mouth 3 (three) times daily as needed for sleep or  anxiety.  06/09/19 06/08/20  Roxan Hockey, MD  apixaban (ELIQUIS) 5 MG TABS tablet Take 1 tablet (5 mg total) by mouth 2 (two) times daily. 06/28/19   Kathie Dike, MD  aspirin EC 81 MG tablet Take 1 tablet (81 mg total) by mouth daily with breakfast. 06/05/19   Denton Brick, Courage, MD  atorvastatin (LIPITOR) 40 MG tablet Take 1 tablet (40 mg total) by mouth at bedtime. Do not restart until instructed by MD Patient not taking: Reported on 06/21/2019 05/14/19   Tat, Shanon Brow, MD  Bacillus Coagulans-Inulin (ALIGN PREBIOTIC-PROBIOTIC) 5-1.25 MG-GM CHEW Chew 1 tablet by mouth daily. 11/14/18   Isla Pence, MD  Calcium Carbonate-Vitamin D (CALTRATE 600+D PO) Take 1 tablet by mouth daily.    [provider]  cetirizine (ZYRTEC) 10 MG chewable tablet Chew 10 mg by mouth daily.    [provider]  diclofenac sodium (VOLTAREN) 1 % GEL Apply 4 g topically 4 (four) times daily. (for arthritis to REPLACE oral diclofenac) 06/13/19   Ronnie Doss M, DO  diltiazem (CARDIZEM CD) 240 MG 24 hr capsule Take 1 capsule (240 mg total) by mouth daily. 06/28/19   Kathie Dike, MD  DULoxetine (CYMBALTA) 60 MG capsule Take 1 capsule (60 mg total) by mouth daily. Patient taking differently: Take 60 mg by mouth every morning.  04/27/19   Janora Norlander, DO  famotidine (PEPCID AC) 10 MG tablet Take 1 tablet (10 mg total) by mouth daily. 06/20/19   Janora Norlander, DO  furosemide (LASIX) 20 MG tablet Take 1 tablet (20 mg total) by mouth daily. For swelling/Fluid 06/09/19   Roxan Hockey, MD  hydrALAZINE (APRESOLINE) 50 MG tablet Take 1 tablet (50 mg total) by mouth 3 (three) times daily. For BP 06/05/19   Emokpae, Courage, MD  isosorbide mononitrate (IMDUR) 30 MG 24 hr tablet Take 1 tablet (30 mg total) by mouth daily. 06/06/19   Roxan Hockey, MD  levothyroxine (SYNTHROID) 50 MCG tablet TAKE 1 TABLET ONCE DAILY Patient taking differently: Take 50 mcg by mouth daily before breakfast.   04/26/19   Janora Norlander, DO  meclizine (ANTIVERT) 25 MG tablet Take 25 mg by mouth 3 (three) times daily as needed for dizziness.    [provider]  Multiple Vitamin (MULTIVITAMIN WITH MINERALS) TABS tablet Take 1 tablet by mouth daily.    [provider]  omeprazole (PRILOSEC) 40 MG capsule TAKE 1 CAPSULE DAILY Patient taking differently: Take 40 mg by mouth daily before breakfast.  04/26/19   Ronnie Doss M, DO  ondansetron (ZOFRAN) 4 MG tablet Take 1 tablet (4 mg total) by mouth every 6 (six) hours as needed for nausea. 06/09/19   Roxan Hockey, MD  raloxifene (EVISTA) 60 MG tablet Take 1 tablet (60 mg total) by mouth daily. Patient taking differently: Take 60 mg by mouth every morning.  04/27/19   Janora Norlander, DO  atorvastatin (LIPITOR) 40 MG tablet Take 1 tablet (40 mg total) by mouth at bedtime. 11/05/18   Janora Norlander, DO  DULoxetine (CYMBALTA) 60 MG capsule Take 1 capsule (60 mg total) by mouth daily. 11/05/18   Janora Norlander, DO    Family History Family History  Problem Relation Age of Onset   Arthritis Sister    Hyperlipidemia Sister    Hypertension Sister    Cancer Brother    Diabetes Brother    Heart disease Brother     Social History Social History   Tobacco Use   Smoking status: Never Smoker   Smokeless tobacco: Never Used  Substance Use Topics   Alcohol use: No   Drug use: No     Allergies   Sulfa antibiotics, Codeine, Doxycycline, Erythromycin, Ibuprofen, Levaquin [levofloxacin], and Penicillins   Review of Systems Review of Systems  Constitutional: Positive for activity change, appetite change and fatigue. Negative for fever.  HENT: Negative for congestion.   Eyes: Negative for visual disturbance.  Respiratory: Negative for cough, chest tightness and shortness of breath.   Cardiovascular: Negative for chest pain.  Gastrointestinal: Positive for abdominal pain and nausea. Negative for vomiting.    Genitourinary: Negative for dysuria.  Musculoskeletal: Positive for arthralgias and myalgias.  Neurological: Positive for weakness. Negative for dizziness and headaches.   all other systems are negative except as noted in the HPI and PMH.     Physical Exam Updated Vital Signs BP 139/76    Pulse 100    Resp 19    Ht 5' 2"  (1.575 m)    Wt 84.6 kg    SpO2 95%    BMI 34.11 kg/m   Physical Exam Vitals signs and nursing note reviewed.  Constitutional:      General: She is not in acute distress.    Appearance: She is well-developed.     Comments: Chronically ill-appearing  HENT:     Head: Normocephalic and atraumatic.     Mouth/Throat:     Mouth: Mucous membranes are dry.     Pharynx: No oropharyngeal exudate.  Eyes:     Conjunctiva/sclera: Conjunctivae normal.     Pupils: Pupils are equal, round, and reactive to light.  Neck:     Musculoskeletal: Normal range of motion and neck supple.     Comments: No meningismus. Cardiovascular:     Rate and Rhythm: Normal rate and regular rhythm.     Heart sounds: Normal heart sounds. No murmur.  Pulmonary:     Effort: Pulmonary effort is normal. No respiratory distress.     Breath sounds: Normal breath sounds.  Abdominal:     Palpations: Abdomen is soft.     Tenderness: There is abdominal tenderness. There is no guarding or rebound.     Comments: Mild diffuse tenderness No guarding or rebound  Musculoskeletal: Normal range of motion.        General: No tenderness.  Skin:    General: Skin is warm.  Neurological:     Mental Status: She is alert and oriented to person, place, and time.     Cranial Nerves: No cranial nerve deficit.     Motor: No abnormal muscle tone.     Coordination: Coordination normal.     Comments:  5/5 strength throughout. CN 2-12 intact.Equal grip strength.   Psychiatric:        Behavior: Behavior normal.      ED Treatments / Results  Labs (all labs ordered are listed, but only abnormal results are  displayed) Labs Reviewed  CBC WITH DIFFERENTIAL/PLATELET - Abnormal; Notable for the following components:      Result Value   Hemoglobin 11.9 (*)    Monocytes Absolute 1.4 (*)    Abs Immature Granulocytes 0.10 (*)    All other components within normal limits  COMPREHENSIVE METABOLIC PANEL - Abnormal; Notable for the following components:   Chloride 96 (*)    Glucose, Bld 128 (*)    Creatinine, Ser 1.33 (*)    Albumin 3.3 (*)    ALT 48 (*)    GFR calc non Af Amer 38 (*)    GFR calc Af Amer 44 (*)    All other components within normal limits  SARS CORONAVIRUS 2 (TAT 6-24 HRS)  URINE CULTURE  URINALYSIS, ROUTINE W REFLEX MICROSCOPIC    EKG EKG Interpretation  Date/Time:  Wednesday June 29 2019 17:08:00 EST Ventricular Rate:  102 PR Interval:    QRS Duration: 88 QT Interval:  328 QTC Calculation: 428 R Axis:   50 Text Interpretation: Sinus tachycardia Probable anteroseptal infarct, old Minimal ST depression, lateral leads No significant change was found Confirmed by Ezequiel Essex [phone]) on 06/29/2019 5:16:02 PM   Radiology Ct Abdomen Pelvis Wo Contrast  Result Date: 06/29/2019 CLINICAL DATA:  78 year old female with increasing abdominal pain over the last few days. EXAM: CT ABDOMEN AND PELVIS WITHOUT CONTRAST TECHNIQUE: Multidetector CT imaging of the abdomen and pelvis was performed following the standard protocol without IV contrast. COMPARISON:  CT Abdomen and Pelvis 05/31/2019 and earlier. FINDINGS: Lower chest: Large hiatal hernia again noted. No cardiomegaly or pericardial effusion. But there are new layering bilateral pleural effusions since October, small right greater than left. Associated lung base atelectasis. Hepatobiliary: Negative noncontrast liver. Diminutive or absent gallbladder as before. Pancreas: Stable, negative. 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 Adrenals/Urinary Tract: Normal adrenal glands. Right renal  vascular calcifications. No nephrolithiasis. Stable renal collecting systems. Decompressed ureters. Mildly distended but otherwise unremarkable urinary bladder. Stomach/Bowel: Oral contrast mixed with stool in the rectum. Sigmoid diverticulosis without active inflammation today. Increased retained stool at the splenic flexure, which no longer appears inflamed. Contrast mixed with stool from the mid transverse proximally. Redundant hepatic flexure. Negative right colon. No large bowel inflammation. Negative terminal ileum. Diminutive or absent appendix. No dilated small bowel. Large gastric hernia, unremarkable intra-abdominal stomach. Negative duodenum. No free air, free fluid. Vascular/Lymphatic: Extensive Aortoiliac calcified atherosclerosis. Vascular patency is not evaluated in the absence of IV contrast. Reproductive: Benign-appearing left ovarian cyst with simple fluid density remain stable. Surgically absent uterus. Diminutive or absent right ovary. Other: No pelvic free fluid. Musculoskeletal: Chronic L4 compression fracture. Chronic right posterior 8th rib fracture. No acute osseous abnormality identified. IMPRESSION: 1. Resolved large bowel inflammation since October with no acute or inflammatory process identified in the noncontrast abdomen or pelvis. 2. New small layering pleural effusions. Chronic large hiatal hernia. Associated lung base atelectasis. 3. Chronic and benign findings of the spleen and left ovary. 4. Aortic Atherosclerosis (ICD10-I70.0). Electronically Signed   By: Genevie Ann M.D.   On: 06/29/2019 22:57   Ct Head Wo Contrast  Result Date: 06/29/2019 CLINICAL DATA:  Altered level of consciousness EXAM: CT HEAD WITHOUT CONTRAST TECHNIQUE: Contiguous axial images were obtained from the base of the skull through the vertex without intravenous contrast. COMPARISON:  04/26/2016 FINDINGS: Brain: . There is atrophy and chronic small vessel disease changes. Cerebellar  atrophy. No acute  intracranial abnormality. Specifically, no hemorrhage, hydrocephalus, mass lesion, acute infarction, or significant intracranial injury. Vascular: No hyperdense vessel or unexpected calcification. Skull: No acute calvarial abnormality. Sinuses/Orbits: Visualized paranasal sinuses and mastoids clear. Orbital soft tissues unremarkable. Other: None IMPRESSION: Atrophy, chronic microvascular disease. No acute intracranial abnormality. Electronically Signed   By: Rolm Baptise M.D.   On: 06/29/2019 22:51   Dg Chest Portable 1 View  Result Date: 06/29/2019 CLINICAL DATA:  Shortness of breath EXAM: PORTABLE CHEST 1 VIEW COMPARISON:  06/24/2019, 06/21/2019, 06/07/2019 FINDINGS: Cardiomegaly with central vascular congestion and small pleural effusions. Large retrocardiac opacity corresponding to hiatal hernia. Aortic atherosclerosis. IMPRESSION: 1. Cardiomegaly with mild central congestion and trace pleural effusions. 2. Large hiatal hernia Electronically Signed   By: Donavan Foil M.D.   On: 06/29/2019 17:10    Procedures Procedures (including critical care time)  Medications Ordered in ED Medications - No data to display   Initial Impression / Assessment and Plan / ED Course  I have reviewed the triage vital signs and the nursing notes.  Pertinent labs & imaging results that were available during my care of the patient were reviewed by me and considered in my medical decision making (see chart for details).       Patient here with generalized weakness after discharge from the hospital yesterday.  Requesting nursing home placement.  Recent admission for UTI and atrial fibrillation.   Screening labs are placed.  Patient discharge summary reviewed.  She was not sent home on any antibiotics.  Her labs appear to be at baseline.  Chest x-ray shows residual cardiomegaly and vascular congestion.  Given patient's vague abdominal pain and recent admission for colitis, repeat imaging was obtained of her  abdomen which shows resolution of her previous colitis. Does show small pleural effusions likely secondary to her diastolic CHF.  Her Lasix has been held during her hospitalization which was restarted currently.  She is in no respiratory distress.  Patient feels improved.   Social work and Tourist information centre manager have been consulted.  Patient and family are agreeable to transfer to extended care facility. PT OT consult placed as well as coronavirus screening. UA pending  Faithann Natal was evaluated in Emergency Department on 06/29/2019 for the symptoms described in the history of present illness. She was evaluated in the context of the global COVID-19 pandemic, which necessitated consideration that the patient might be at risk for infection with the SARS-CoV-2 virus that causes COVID-19. Institutional protocols and algorithms that pertain to the evaluation of patients at risk for COVID-19 are in a state of rapid change based on information released by regulatory bodies including the CDC and federal and state organizations. These policies and algorithms were followed during the patient's care in the ED.  Final Clinical Impressions(s) / ED Diagnoses   Final diagnoses:  None    ED Discharge Orders    None       Ayn Domangue, Annie Main, MD 06/29/19 2328

## 2019-06-29 NOTE — Telephone Encounter (Signed)
Spoke to [place].  I will work with [facility], Cyril Mourning who need to see if she can track down who is arranging this for the patient.  I agree that she most certainly needs closer medical attention and care given rapid decline over the last couple of months with recurrent hospitalization.

## 2019-06-30 DIAGNOSIS — R531 Weakness: Secondary | ICD-10-CM | POA: Diagnosis not present

## 2019-06-30 LAB — URINALYSIS, ROUTINE W REFLEX MICROSCOPIC
Bilirubin Urine: NEGATIVE
Glucose, UA: NEGATIVE mg/dL
Hgb urine dipstick: NEGATIVE
Ketones, ur: NEGATIVE mg/dL
Leukocytes,Ua: NEGATIVE
Nitrite: NEGATIVE
Specific Gravity, Urine: 1.015 (ref 1.005–1.030)
pH: 8 (ref 5.0–8.0)

## 2019-06-30 LAB — SARS CORONAVIRUS 2 (TAT 6-24 HRS): SARS Coronavirus 2: NEGATIVE

## 2019-06-30 LAB — URINALYSIS, MICROSCOPIC (REFLEX)
Bacteria, UA: NONE SEEN
RBC / HPF: NONE SEEN RBC/hpf (ref 0–5)
WBC, UA: NONE SEEN WBC/hpf (ref 0–5)

## 2019-06-30 NOTE — ED Notes (Signed)
Patient set up for breakfast.

## 2019-06-30 NOTE — Progress Notes (Signed)
CSW attempted to contact patients brother in law without success. CSW left confidential voicemail requesting call back concerning patients discharge plan. Family is expecting a bed at [facility] who in turn does not have any available beds until Monday. Patient made aware by CSW of this matter and informed her that [facility] team would continue seeking placement at SNF level.   Pt currently boarding awaiting placement.

## 2019-06-30 NOTE — Evaluation (Signed)
Physical Therapy Evaluation Patient Details Name: Tamara Stein MRN: [id number] DOB: 21-Jul-1941 Today's Date: 06/30/2019   History of Present Illness  Patient brought in by EMS with generalized weakness since yesterday.  She was brought in by EMS.  She was discharged from the hospital yesterday after a weeklong stay for generalized weakness, UTI with sepsis and atrial fibrillation with AVR and AKI.  Patient initially declined to go to a nursing facility but is now requesting to go facility for placement.  She states she feels generally weak and cannot take care of herself.  Denies any falls or trauma.  She complains of some vague abdominal pain with intermittent nausea.  She does not think she is taking any antibiotics any longer.  She denies any chest pain or shortness of breath.  She denies any cough or fever.    Clinical Impression  Patient demonstrates slow labored movement for sitting up at bedside with occasional falling backwards, unable to transfer to c/o fatigue and generalized weakness.  Patient will benefit from continued physical therapy in hospital and recommended venue below to increase strength, balance, endurance for safe ADLs and gait.    Follow Up Recommendations SNF;Supervision - Intermittent;Supervision for mobility/OOB    Equipment Recommendations  None recommended by PT    Recommendations for Other Services       Precautions / Restrictions Precautions Precautions: Fall Restrictions Weight Bearing Restrictions: No      Mobility  Bed Mobility Overal bed mobility: Needs Assistance Bed Mobility: Supine to Sit;Sit to Supine     Supine to sit: Mod assist Sit to supine: Min assist   General bed mobility comments: slow labored movement  Transfers                    Ambulation/Gait                Stairs            Wheelchair Mobility    Modified Rankin (Stroke Patients Only)       Balance Overall balance assessment: Needs  assistance Sitting-balance support: Bilateral upper extremity supported;Feet unsupported Sitting balance-Leahy Scale: Fair Sitting balance - Comments: seated at EOB                                     Pertinent Vitals/Pain Pain Assessment: No/denies pain    Home Living Family/patient expects to be discharged to:: Private residence Living Arrangements: Other relatives Available Help at Discharge: Family;Personal care attendant;Available 24 hours/day Type of Home: House Home Access: Ramped entrance     Home Layout: One level Home Equipment: Walker - 2 wheels;Electric scooter      Prior Function Level of Independence: Needs assistance   Gait / Transfers Assistance Needed: Mod Independent scooting over from bed to electric scooter to bathrom with rails - non-ambulatory  ADL's / Homemaking Assistance Needed: assisted by family and paid attendents x 3 days/week        Hand Dominance        Extremity/Trunk Assessment   Upper Extremity Assessment Upper Extremity Assessment: Generalized weakness    Lower Extremity Assessment Lower Extremity Assessment: Generalized weakness    Cervical / Trunk Assessment Cervical / Trunk Assessment: Normal  Communication   Communication: No difficulties  Cognition Arousal/Alertness: Awake/alert Behavior During Therapy: WFL for tasks assessed/performed Overall Cognitive Status: Within Functional Limits for tasks assessed  General Comments      Exercises     Assessment/Plan    PT Assessment Patient needs continued PT services  PT Problem List Decreased strength;Decreased activity tolerance;Decreased balance;Decreased mobility       PT Treatment Interventions Functional mobility training;Therapeutic activities;Therapeutic exercise;Balance training;Patient/family education;Wheelchair mobility training    PT Goals (Current goals can be found in the Care Plan  section)  Acute Rehab PT Goals Patient Stated Goal: Return home after rehab PT Goal Formulation: With patient Time For Goal Achievement: 07/14/19 Potential to Achieve Goals: Good    Frequency Min 2X/week   Barriers to discharge        Co-evaluation               AM-PAC PT "6 Clicks" Mobility  Outcome Measure Help needed turning from your back to your side while in a flat bed without using bedrails?: A Little Help needed moving from lying on your back to sitting on the side of a flat bed without using bedrails?: A Little Help needed moving to and from a bed to a chair (including a wheelchair)?: A Lot Help needed standing up from a chair using your arms (e.g., wheelchair or bedside chair)?: A Lot Help needed to walk in hospital room?: Total Help needed climbing 3-5 steps with a railing? : Total 6 Click Score: 12    End of Session   Activity Tolerance: Patient tolerated treatment well;Patient limited by fatigue Patient left: in bed;with call bell/phone within reach Nurse Communication: Mobility status PT Visit Diagnosis: Unsteadiness on feet (R26.81);Muscle weakness (generalized) (M62.81);Difficulty in walking, not elsewhere classified (R26.2)    Time: 1027-2536 PT Time Calculation (min) (ACUTE ONLY): 19 min   Charges:   PT Evaluation $PT Eval Moderate Complexity: 1 Mod PT Treatments $Therapeutic Activity: 8-22 mins        9:33 AM, 06/30/19 Lonell Grandchild, MPT Physical Therapist with [facility] 336 [phone] office [phone] mobile phone

## 2019-06-30 NOTE — Plan of Care (Signed)
  Problem: Acute Rehab PT Goals(only PT should resolve) Goal: Pt Will Go Supine/Side To Sit Outcome: Progressing Flowsheets (Taken 06/30/2019 0941) Pt will go Supine/Side to Sit: with min guard assist Goal: Patient Will Transfer Sit To/From Stand Outcome: Progressing Flowsheets (Taken 06/30/2019 0941) Patient will transfer sit to/from stand: with moderate assist Goal: Pt Will Transfer Bed To Chair/Chair To Bed Outcome: Progressing Flowsheets (Taken 06/30/2019 0941) Pt will Transfer Bed to Chair/Chair to Bed: with mod assist   9:42 AM, 06/30/19 Lonell Grandchild, MPT Physical Therapist with [facility] 336 [phone] office [phone] mobile phone

## 2019-06-30 NOTE — ED Notes (Signed)
Patient requesting something for her nerves. Will check MAR

## 2019-06-30 NOTE — ED Notes (Signed)
Pt given blankets and lights turned off.

## 2019-07-01 ENCOUNTER — Inpatient Hospital Stay
Admission: RE | Admit: 2019-07-01 | Discharge: 2019-07-11 | Disposition: A | Discharge status: Other Acute Inpatient Hospital | Payer: Medicare Other | Source: Ambulatory Visit | Attending: Internal Medicine | Admitting: Internal Medicine

## 2019-07-01 DIAGNOSIS — Z741 Need for assistance with personal care: Secondary | ICD-10-CM | POA: Diagnosis not present

## 2019-07-01 DIAGNOSIS — R2689 Other abnormalities of gait and mobility: Secondary | ICD-10-CM | POA: Diagnosis not present

## 2019-07-01 DIAGNOSIS — D62 Acute posthemorrhagic anemia: Secondary | ICD-10-CM | POA: Diagnosis not present

## 2019-07-01 DIAGNOSIS — M1990 Unspecified osteoarthritis, unspecified site: Secondary | ICD-10-CM | POA: Diagnosis not present

## 2019-07-01 DIAGNOSIS — N179 Acute kidney failure, unspecified: Secondary | ICD-10-CM | POA: Diagnosis not present

## 2019-07-01 DIAGNOSIS — I5033 Acute on chronic diastolic (congestive) heart failure: Secondary | ICD-10-CM | POA: Diagnosis not present

## 2019-07-01 DIAGNOSIS — M810 Age-related osteoporosis without current pathological fracture: Secondary | ICD-10-CM | POA: Diagnosis not present

## 2019-07-01 DIAGNOSIS — B952 Enterococcus as the cause of diseases classified elsewhere: Secondary | ICD-10-CM | POA: Diagnosis not present

## 2019-07-01 DIAGNOSIS — G3189 Other specified degenerative diseases of nervous system: Secondary | ICD-10-CM | POA: Diagnosis not present

## 2019-07-01 DIAGNOSIS — M6281 Muscle weakness (generalized): Secondary | ICD-10-CM | POA: Diagnosis not present

## 2019-07-01 DIAGNOSIS — I5032 Chronic diastolic (congestive) heart failure: Secondary | ICD-10-CM | POA: Diagnosis not present

## 2019-07-01 DIAGNOSIS — J90 Pleural effusion, not elsewhere classified: Secondary | ICD-10-CM | POA: Diagnosis not present

## 2019-07-01 DIAGNOSIS — R0602 Shortness of breath: Secondary | ICD-10-CM | POA: Diagnosis not present

## 2019-07-01 DIAGNOSIS — E785 Hyperlipidemia, unspecified: Secondary | ICD-10-CM | POA: Diagnosis not present

## 2019-07-01 DIAGNOSIS — K5730 Diverticulosis of large intestine without perforation or abscess without bleeding: Secondary | ICD-10-CM | POA: Diagnosis not present

## 2019-07-01 DIAGNOSIS — R072 Precordial pain: Secondary | ICD-10-CM | POA: Diagnosis not present

## 2019-07-01 DIAGNOSIS — A419 Sepsis, unspecified organism: Secondary | ICD-10-CM | POA: Diagnosis not present

## 2019-07-01 DIAGNOSIS — F339 Major depressive disorder, recurrent, unspecified: Secondary | ICD-10-CM | POA: Diagnosis not present

## 2019-07-01 DIAGNOSIS — F419 Anxiety disorder, unspecified: Secondary | ICD-10-CM | POA: Diagnosis not present

## 2019-07-01 DIAGNOSIS — E039 Hypothyroidism, unspecified: Secondary | ICD-10-CM | POA: Diagnosis not present

## 2019-07-01 DIAGNOSIS — I129 Hypertensive chronic kidney disease with stage 1 through stage 4 chronic kidney disease, or unspecified chronic kidney disease: Secondary | ICD-10-CM | POA: Diagnosis not present

## 2019-07-01 DIAGNOSIS — R079 Chest pain, unspecified: Secondary | ICD-10-CM | POA: Diagnosis not present

## 2019-07-01 DIAGNOSIS — A498 Other bacterial infections of unspecified site: Secondary | ICD-10-CM | POA: Diagnosis not present

## 2019-07-01 DIAGNOSIS — I4891 Unspecified atrial fibrillation: Secondary | ICD-10-CM | POA: Diagnosis not present

## 2019-07-01 DIAGNOSIS — Z20828 Contact with and (suspected) exposure to other viral communicable diseases: Secondary | ICD-10-CM | POA: Diagnosis not present

## 2019-07-01 DIAGNOSIS — K219 Gastro-esophageal reflux disease without esophagitis: Secondary | ICD-10-CM | POA: Diagnosis not present

## 2019-07-01 DIAGNOSIS — R109 Unspecified abdominal pain: Secondary | ICD-10-CM | POA: Diagnosis not present

## 2019-07-01 DIAGNOSIS — N1832 Chronic kidney disease, stage 3b: Secondary | ICD-10-CM | POA: Diagnosis not present

## 2019-07-01 DIAGNOSIS — Z79899 Other long term (current) drug therapy: Secondary | ICD-10-CM | POA: Diagnosis not present

## 2019-07-01 DIAGNOSIS — R6520 Severe sepsis without septic shock: Secondary | ICD-10-CM | POA: Diagnosis not present

## 2019-07-01 DIAGNOSIS — K449 Diaphragmatic hernia without obstruction or gangrene: Secondary | ICD-10-CM | POA: Diagnosis not present

## 2019-07-01 DIAGNOSIS — Z7901 Long term (current) use of anticoagulants: Secondary | ICD-10-CM | POA: Diagnosis not present

## 2019-07-01 DIAGNOSIS — R279 Unspecified lack of coordination: Secondary | ICD-10-CM | POA: Diagnosis not present

## 2019-07-01 DIAGNOSIS — I10 Essential (primary) hypertension: Secondary | ICD-10-CM | POA: Diagnosis not present

## 2019-07-01 DIAGNOSIS — J189 Pneumonia, unspecified organism: Secondary | ICD-10-CM | POA: Diagnosis not present

## 2019-07-01 DIAGNOSIS — I130 Hypertensive heart and chronic kidney disease with heart failure and stage 1 through stage 4 chronic kidney disease, or unspecified chronic kidney disease: Secondary | ICD-10-CM | POA: Diagnosis not present

## 2019-07-01 DIAGNOSIS — G319 Degenerative disease of nervous system, unspecified: Secondary | ICD-10-CM | POA: Diagnosis not present

## 2019-07-01 DIAGNOSIS — D631 Anemia in chronic kidney disease: Secondary | ICD-10-CM | POA: Diagnosis not present

## 2019-07-01 DIAGNOSIS — Z7982 Long term (current) use of aspirin: Secondary | ICD-10-CM | POA: Diagnosis not present

## 2019-07-01 DIAGNOSIS — N390 Urinary tract infection, site not specified: Secondary | ICD-10-CM | POA: Diagnosis not present

## 2019-07-01 DIAGNOSIS — R0789 Other chest pain: Secondary | ICD-10-CM | POA: Diagnosis not present

## 2019-07-01 DIAGNOSIS — F329 Major depressive disorder, single episode, unspecified: Secondary | ICD-10-CM | POA: Diagnosis not present

## 2019-07-01 DIAGNOSIS — R531 Weakness: Secondary | ICD-10-CM | POA: Diagnosis not present

## 2019-07-01 DIAGNOSIS — G119 Hereditary ataxia, unspecified: Secondary | ICD-10-CM | POA: Diagnosis not present

## 2019-07-01 DIAGNOSIS — N183 Chronic kidney disease, stage 3 unspecified: Secondary | ICD-10-CM | POA: Diagnosis not present

## 2019-07-01 DIAGNOSIS — R471 Dysarthria and anarthria: Secondary | ICD-10-CM | POA: Diagnosis not present

## 2019-07-01 DIAGNOSIS — N189 Chronic kidney disease, unspecified: Secondary | ICD-10-CM | POA: Diagnosis not present

## 2019-07-01 DIAGNOSIS — J918 Pleural effusion in other conditions classified elsewhere: Secondary | ICD-10-CM | POA: Diagnosis not present

## 2019-07-01 DIAGNOSIS — R1311 Dysphagia, oral phase: Secondary | ICD-10-CM | POA: Diagnosis not present

## 2019-07-01 NOTE — Clinical Social Work Note (Signed)
[facility] has offered pt a bed. Discussed with pt's sister and niece by phone. They are agreeable to this. Updated [facility] Fuss and they will take pt today with the dc clinical that has been completed by MD. Updated pt's ED RN who will call report. Pt will transfer today. There are no other LCSW needs at this time.

## 2019-07-01 NOTE — ED Notes (Signed)
Peri-care performed.  Buttock red.

## 2019-07-01 NOTE — ED Provider Notes (Signed)
Discharge note.  Ms. pain now is a 78 year old lady presented to the emergency department with weakness she was in the hospital November 10 and discharged November 17 with a diagnosis of atrial fib urinary tract infection with sepsis chronic kidney disease hypertension hypothyroid chronic anemia congestive heart failure.  The patient went home for a week and was to weak and tired to take care of herself.  She was seen in the emergency department labs were unremarkable and she wanted nursing home placement.  Patient has been stable in the emergency department and has continued the medicines that she was discharged on from the hospital on November 17.  Please refer to the discharge summary that was done on November 17 by Dr. Myrtis Ser, MD 07/01/19 1158

## 2019-07-01 NOTE — ED Notes (Signed)
Pts bottom was cleaned and new purwick in place.

## 2019-07-01 NOTE — Discharge Instructions (Signed)
Pt to go to nh now

## 2019-07-03 LAB — URINE CULTURE: Culture: 40000 — AB

## 2019-07-04 ENCOUNTER — Ambulatory Visit: Payer: Medicare Other | Admitting: Internal Medicine

## 2019-07-04 ENCOUNTER — Telehealth: Payer: Self-pay

## 2019-07-04 ENCOUNTER — Encounter: Payer: Self-pay | Admitting: Adult Health

## 2019-07-04 ENCOUNTER — Non-Acute Institutional Stay (SKILLED_NURSING_FACILITY): Payer: Medicare Other | Admitting: Adult Health

## 2019-07-04 ENCOUNTER — Other Ambulatory Visit: Payer: Self-pay | Admitting: Adult Health

## 2019-07-04 DIAGNOSIS — N1832 Chronic kidney disease, stage 3b: Secondary | ICD-10-CM | POA: Diagnosis not present

## 2019-07-04 DIAGNOSIS — K219 Gastro-esophageal reflux disease without esophagitis: Secondary | ICD-10-CM

## 2019-07-04 DIAGNOSIS — M1990 Unspecified osteoarthritis, unspecified site: Secondary | ICD-10-CM

## 2019-07-04 DIAGNOSIS — I130 Hypertensive heart and chronic kidney disease with heart failure and stage 1 through stage 4 chronic kidney disease, or unspecified chronic kidney disease: Secondary | ICD-10-CM

## 2019-07-04 DIAGNOSIS — I4891 Unspecified atrial fibrillation: Secondary | ICD-10-CM

## 2019-07-04 DIAGNOSIS — I5032 Chronic diastolic (congestive) heart failure: Secondary | ICD-10-CM

## 2019-07-04 DIAGNOSIS — F339 Major depressive disorder, recurrent, unspecified: Secondary | ICD-10-CM

## 2019-07-04 DIAGNOSIS — G319 Degenerative disease of nervous system, unspecified: Secondary | ICD-10-CM

## 2019-07-04 DIAGNOSIS — M810 Age-related osteoporosis without current pathological fracture: Secondary | ICD-10-CM

## 2019-07-04 DIAGNOSIS — E039 Hypothyroidism, unspecified: Secondary | ICD-10-CM | POA: Diagnosis not present

## 2019-07-04 DIAGNOSIS — I5033 Acute on chronic diastolic (congestive) heart failure: Secondary | ICD-10-CM

## 2019-07-04 MED ORDER — ALPRAZOLAM 0.5 MG PO TABS: 0.5000 mg | ORAL_TABLET | Freq: Three times a day (TID) | ORAL | 0 refills | Status: DC | PRN

## 2019-07-04 NOTE — Progress Notes (Signed)
Location:    [facility] Room Number: 154/W Place of Service:  SNF (31)   CODE STATUS: Full Code  Allergies  Allergen Reactions  . Sulfa Antibiotics Anaphylaxis    Facial swelling   . Codeine Other (See Comments)    BLACK OUTS  . Doxycycline Nausea Only  . Erythromycin Swelling  . Ibuprofen Other (See Comments)    Stomach pain, muscle spasm, GI bleeding  . Levaquin [Levofloxacin] Other (See Comments)    Insomnia/trouble breathing  . Penicillins Diarrhea    Has patient had a PCN reaction causing immediate rash, facial/tongue/throat swelling, SOB or lightheadedness with hypotension: No Has patient had a PCN reaction causing severe rash involving mucus membranes or skin necrosis: No Has patient had a PCN reaction that required hospitalization Unknown Has patient had a PCN reaction occurring within the last 10 years: No If all of the above answers are "NO", then may proceed with Cephalosporin use.     Chief Complaint  Patient presents with  . Hospitalization Follow-up    Hospitalization Follow Up    HPI:  She is a 78 year old woman who had been hospitalized from 06-21-19 through 06-28-19. She had been hospitalized for uti; sepsis; afib and chf. She returned home at that time. She failed being at home. She is here for short term rehab. She had been declining for about one month prior to her hospitalization. She has a Actuary at home several times a week. More than likely this does represent a long term placement for her. She denies any back or joint pain. No changes in her appetite; no insomnia no anxiety. She will continue to be followed for her chronic illnesses including: afib; chf; dyslipidemia.   Past Medical History:  Diagnosis Date  . Allergy   . Anxiety   . Cataract   . 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 Chronic kidney disease   . Chronic knee pain   . Depression   . GERD (gastroesophageal reflux disease)   . Hyperlipidemia   . Hypertension    . Thyroid disease     Past Surgical History:  Procedure Laterality Date  . ABDOMINAL HYSTERECTOMY    . CHOLECYSTECTOMY    . FLEXIBLE SIGMOIDOSCOPY N/A 06/05/2019   Procedure: FLEXIBLE SIGMOIDOSCOPY;  Surgeon: Rogene Houston, MD;  Location: [facility];  Service: Endoscopy;  Laterality: N/A;  . MOUTH SURGERY    . RIGHT ELBOW      Social History   Socioeconomic History  . Marital status: Widowed    Spouse name: Not on file  . Number of children: Not on file  . Years of education: Not on file  . Highest education level: Not on file  Occupational History  . Not on file  Social Needs  . Financial resource strain: Not hard at all  . Food insecurity    Worry: Never true    Inability: Never true  . Transportation needs    Medical: Not on file    Non-medical: Not on file  Tobacco Use  . Smoking status: Never Smoker  . Smokeless tobacco: Never Used  Substance and Sexual Activity  . Alcohol use: No  . Drug use: No  . Sexual activity: Never  Lifestyle  . Physical activity    Days per week: Not on file    Minutes per session: Not on file  . Stress: Not on file  Relationships  . Social connections    Talks on phone: Not on file  Gets together: Not on file    Attends religious service: Not on file    Active member of club or organization: Not on file    Attends meetings of clubs or organizations: Not on file    Relationship status: Not on file  . Intimate partner violence    Fear of current or ex partner: Not on file    Emotionally abused: Not on file    Physically abused: Not on file    Forced sexual activity: Not on file  Other Topics Concern  . Not on file  Social History Narrative  . Not on file   Family History  Problem Relation Age of Onset  . Arthritis Sister   . Hyperlipidemia Sister   . Hypertension Sister   . Cancer Brother   . Diabetes Brother   . Heart disease Brother       VITAL SIGNS BP 106/74   Pulse 87   Temp 97.9 F (36.6 C) (Oral)    Resp 20   Ht 5' 2"  (1.575 m)   Wt 170 lb 6.4 oz (77.3 kg)   BMI 31.17 kg/m   Outpatient Encounter Medications as of 07/04/2019  Medication Sig  . acetaminophen (TYLENOL) 325 MG tablet Take 2 tablets (650 mg total) by mouth every 6 (six) hours as needed for mild pain, fever or headache (or Fever >/= 101).  Marland Kitchen ALPRAZolam (XANAX) 0.5 MG tablet Take 1 tablet (0.5 mg total) by mouth 3 (three) times daily as needed for sleep or anxiety.  Marland Kitchen apixaban (ELIQUIS) 5 MG TABS tablet Take 1 tablet (5 mg total) by mouth 2 (two) times daily.  Marland Kitchen aspirin EC 81 MG tablet Take 1 tablet (81 mg total) by mouth daily with breakfast.  . atorvastatin (LIPITOR) 40 MG tablet Take 1 tablet (40 mg total) by mouth at bedtime. Do not restart until instructed by MD  . Bacillus Coagulans-Inulin (ALIGN PREBIOTIC-PROBIOTIC) 5-1.25 MG-GM CHEW Chew 1 tablet by mouth daily.  . Calcium Carbonate-Vitamin D (CALTRATE 600+D PO) Take 1 tablet by mouth daily.  . cetirizine (ZYRTEC) 10 MG chewable tablet Chew 10 mg by mouth daily.  . diclofenac sodium (VOLTAREN) 1 % GEL Apply 4 g topically 4 (four) times daily. (for arthritis to REPLACE oral diclofenac)  . diltiazem (CARDIZEM CD) 240 MG 24 hr capsule Take 1 capsule (240 mg total) by mouth daily.  . DULoxetine (CYMBALTA) 60 MG capsule Take 1 capsule (60 mg total) by mouth daily.  . famotidine (PEPCID AC) 10 MG tablet Take 1 tablet (10 mg total) by mouth daily.  . furosemide (LASIX) 20 MG tablet Take 1 tablet (20 mg total) by mouth daily. For swelling/Fluid  . hydrALAZINE (APRESOLINE) 50 MG tablet Take 1 tablet (50 mg total) by mouth 3 (three) times daily. For BP  . isosorbide mononitrate (IMDUR) 30 MG 24 hr tablet Take 1 tablet (30 mg total) by mouth daily.  Marland Kitchen levothyroxine (SYNTHROID) 50 MCG tablet TAKE 1 TABLET ONCE DAILY  . meclizine (ANTIVERT) 25 MG tablet Take 25 mg by mouth 3 (three) times daily as needed for dizziness.  . Multiple Vitamin (MULTIVITAMIN WITH MINERALS) TABS tablet  Take 1 tablet by mouth daily.  . NON FORMULARY Diet: Regular, NAS Consistent Carbohydrate  . omeprazole (PRILOSEC) 40 MG capsule TAKE 1 CAPSULE DAILY  . ondansetron (ZOFRAN) 4 MG tablet Take 1 tablet (4 mg total) by mouth every 6 (six) hours as needed for nausea.  . raloxifene (EVISTA) 60 MG tablet Take 1 tablet (60  mg total) by mouth daily.  . [DISCONTINUED] atorvastatin (LIPITOR) 40 MG tablet Take 1 tablet (40 mg total) by mouth at bedtime.  . [DISCONTINUED] DULoxetine (CYMBALTA) 60 MG capsule Take 1 capsule (60 mg total) by mouth daily.   No facility-administered encounter medications on file as of 07/04/2019.      SIGNIFICANT DIAGNOSTIC EXAMS  TODAY;   06-08-19: 2-d echo:  1. Left ventricular ejection fraction, by visual estimation, is 65 to 70%. The left ventricle has hyperdynamic function. There is borderline left ventricular hypertrophy.  2. Elevated left ventricular end-diastolic pressure.  3. Left ventricular diastolic parameters are consistent with Grade I diastolic dysfunction (impaired relaxation).  4. The left ventricle has no regional wall motion abnormalities.  5. Global right ventricle has normal systolic function.The right ventricular size is normal. No increase in right ventricular wall thickness.  6. Left atrial size was normal.  7. Right atrial size was normal.  8. Mild to moderate aortic valve annular calcification.  9. Mild to moderate mitral annular calcification. 10. The mitral valve is degenerative. Trace mitral valve regurgitation. 11. The tricuspid valve is grossly normal. Tricuspid valve regurgitation is mild. 12. The aortic valve is tricuspid. Aortic valve regurgitation is not visualized. Mild aortic valve stenosis. 13. There is Mild calcification of the aortic valve. 14. There is Mild thickening of the aortic valve. 15. The pulmonic valve was not well visualized. Pulmonic valve regurgitation is not visualized. 16. Moderately elevated pulmonary artery  systolic pressure. 17. The inferior vena cava is normal in size with greater than 50% respiratory variability, suggesting right atrial pressure of 3 mmHg. 18. The interatrial septum was not well visualized.    06-24-19: renal ultrasound: No significant renal abnormality seen. 5 cm cyst seen in left lower quadrant which most likely corresponds to adnexal cyst seen on prior CT scan.  06-19-19: ct of abdomen and pelvis:  1. Resolved large bowel inflammation since October with no acute or inflammatory process identified in the noncontrast abdomen or pelvis. 2. New small layering pleural effusions. Chronic large hiatal hernia. Associated lung base atelectasis. 3. Chronic and benign findings of the spleen and left ovary. 4. Aortic Atherosclerosis   06-29-19: ct of head; Atrophy, chronic microvascular disease. No acute intracranial abnormality.  07-11-19: chest x-ray: Mild pulmonary vascular congestion and trace pleural effusions. Similar cardiomegaly.   LABS REVIEWED TODAY;   06-21-19: wbc 13.7; hgb 10.8; hct 35.2; mcv 97.8; plt 302; glucose 140; bun 28; creat 1.56; k+ 3.1; na++ 138; ca 8.3; liver normal albumin 3.1 urine culture: enterobacter cloacae: blood culture: no growth 06-22-19: tsh 4.733 06-29-19: wbc 10.2; hgb 11.9; hct 38.2; mcv 96.0 plt 270; glucose 128; bun 18; creat 1.33; k+ 3.7; na++ 141; ca 9,2; liver normal albumin 3.1; urine culture: 40,000 enterobacter aerogenes 07-11-19: wbc 9.4; hgb 12.6; hct 40.1; mcv 95.2 plt 248; glucose 128; bun 24; creat 1.39; k+ 3.6; na++ 136; ca 9.1; liver normal albumin 3.7   Review of Systems  Constitutional: Negative for malaise/fatigue.  Respiratory: Negative for cough and shortness of breath.   Cardiovascular: Negative for chest pain, palpitations and leg swelling.  Gastrointestinal: Negative for abdominal pain, constipation and heartburn.  Musculoskeletal: Negative for back pain, joint pain and myalgias.  Skin: Negative.   Neurological:  Negative for dizziness.  Psychiatric/Behavioral: The patient is not nervous/anxious.     Physical Exam Constitutional:      General: She is not in acute distress.    Appearance: She is well-developed. She is not diaphoretic.  Neck:     Musculoskeletal: Neck supple.     Thyroid: No thyromegaly.  Cardiovascular:     Rate and Rhythm: Normal rate and regular rhythm.     Pulses: Normal pulses.     Heart sounds: Normal heart sounds.  Pulmonary:     Effort: Pulmonary effort is normal. No respiratory distress.     Breath sounds: Normal breath sounds.  Abdominal:     General: Bowel sounds are normal. There is no distension.     Palpations: Abdomen is soft.     Tenderness: There is no abdominal tenderness.  Musculoskeletal:     Right lower leg: No edema.     Left lower leg: No edema.     Comments: Is able to move all extremities   Lymphadenopathy:     Cervical: No cervical adenopathy.  Skin:    General: Skin is warm and dry.  Neurological:     Mental Status: She is alert. Mental status is at baseline.  Psychiatric:        Mood and Affect: Mood normal.      ASSESSMENT/ PLAN:  TODAY;   1. Atrial fibrillation with RVR: heart rate stable will continue asa 81 mg daily eliquis 5 mg twice daily cardizem cd 240 mg daily for rate control  2. Acute on chronic diastolic congestive heart failure: is stable EF 65-70% (06-08-19) will continue lasix 20 mg daily hydralazine 50 mg three times daily imdur 30 mg daily   3. 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 Gastroesophageal reflux disease without esophagitis: is stable will continue prilosec 40 mg daily and pepcid 10 mg daily  5. Hypothyroidism  Unspecified type: is stable tsh 4.733 will continue synthroid 50 mcg daily  6. 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 Stage 3b chronic kidney disease: is stable bun 24 creat 1.39  8. Arthritis: pain is presently managed: will continue voltaren gel 4 gm applied four times daily   9.  Menopausal osteoporosis: is stable will continue evista 60 mg daily   10. Major depression recurrent chronic: is stable will continue cymbalta 60 mg daily; will continue xanax 0.5 three times daily as needed through 07-14-19.   11. Chronic non-seasonal allergic rhinitis: is stable will continue zyrtec 10 mg daily   13. Dyslipidemia: is stable will continue lipitor 40 mg daily    MD is aware of resident's narcotic use and is in agreement with current plan of care. We will attempt to wean resident as appropriate.  Ok Edwards NP [facility]  Contact [phone] Monday through Friday 8am- 5pm  After hours call [phone]

## 2019-07-04 NOTE — Telephone Encounter (Signed)
Post ED Visit - Positive Culture Follow-up  Culture report reviewed by antimicrobial stewardship pharmacist: [facility] Team []  Elenor Quinones, Pharm.D. []  Heide Guile, Pharm.D., BCPS AQ-ID []  Parks Neptune, Pharm.D., BCPS []  Alycia Rossetti, Pharm.D., BCPS []  [place], [place].D., BCPS, AAHIVP []  Legrand Como, Pharm.D., BCPS, AAHIVP []  Salome Arnt, PharmD, BCPS []  Johnnette Gourd, PharmD, BCPS []  Hughes Better, PharmD, BCPS []  Leeroy Cha, PharmD []  Laqueta Linden, PharmD, BCPS []  Albertina Parr, [facility] Team []  Leodis Sias, PharmD []  Lindell Spar, PharmD []  Royetta Asal, PharmD []  Graylin Shiver, Rph []  Rema Fendt) Glennon Mac, PharmD []  Arlyn Dunning, PharmD []  Netta Cedars, PharmD []  Dia Sitter, PharmD []  Leone Haven, PharmD []  Gretta Arab, PharmD []  Theodis Shove, PharmD []  Peggyann Juba, PharmD []  Reuel Boom, PharmD   Positive urine culture and no further patient follow-up is required at this time.  Genia Del 07/04/2019, 9:29 AM

## 2019-07-05 ENCOUNTER — Ambulatory Visit (INDEPENDENT_AMBULATORY_CARE_PROVIDER_SITE_OTHER): Payer: Medicare Other

## 2019-07-05 ENCOUNTER — Ambulatory Visit: Payer: Self-pay | Admitting: *Deleted

## 2019-07-05 ENCOUNTER — Other Ambulatory Visit: Payer: Self-pay | Admitting: *Deleted

## 2019-07-05 DIAGNOSIS — R011 Cardiac murmur, unspecified: Secondary | ICD-10-CM

## 2019-07-05 DIAGNOSIS — I129 Hypertensive chronic kidney disease with stage 1 through stage 4 chronic kidney disease, or unspecified chronic kidney disease: Secondary | ICD-10-CM | POA: Diagnosis not present

## 2019-07-05 DIAGNOSIS — D62 Acute posthemorrhagic anemia: Secondary | ICD-10-CM

## 2019-07-05 DIAGNOSIS — K219 Gastro-esophageal reflux disease without esophagitis: Secondary | ICD-10-CM | POA: Diagnosis not present

## 2019-07-05 DIAGNOSIS — G3189 Other specified degenerative diseases of nervous system: Secondary | ICD-10-CM

## 2019-07-05 DIAGNOSIS — J90 Pleural effusion, not elsewhere classified: Secondary | ICD-10-CM | POA: Diagnosis not present

## 2019-07-05 DIAGNOSIS — K449 Diaphragmatic hernia without obstruction or gangrene: Secondary | ICD-10-CM

## 2019-07-05 DIAGNOSIS — E039 Hypothyroidism, unspecified: Secondary | ICD-10-CM

## 2019-07-05 DIAGNOSIS — K5730 Diverticulosis of large intestine without perforation or abscess without bleeding: Secondary | ICD-10-CM | POA: Diagnosis not present

## 2019-07-05 DIAGNOSIS — Z993 Dependence on wheelchair: Secondary | ICD-10-CM

## 2019-07-05 DIAGNOSIS — J189 Pneumonia, unspecified organism: Secondary | ICD-10-CM | POA: Diagnosis not present

## 2019-07-05 DIAGNOSIS — N183 Chronic kidney disease, stage 3 unspecified: Secondary | ICD-10-CM

## 2019-07-05 DIAGNOSIS — Z7982 Long term (current) use of aspirin: Secondary | ICD-10-CM

## 2019-07-05 DIAGNOSIS — H269 Unspecified cataract: Secondary | ICD-10-CM

## 2019-07-05 DIAGNOSIS — R2689 Other abnormalities of gait and mobility: Secondary | ICD-10-CM | POA: Diagnosis not present

## 2019-07-05 DIAGNOSIS — Z79899 Other long term (current) drug therapy: Secondary | ICD-10-CM

## 2019-07-05 DIAGNOSIS — E785 Hyperlipidemia, unspecified: Secondary | ICD-10-CM

## 2019-07-05 DIAGNOSIS — Z792 Long term (current) use of antibiotics: Secondary | ICD-10-CM

## 2019-07-05 DIAGNOSIS — F329 Major depressive disorder, single episode, unspecified: Secondary | ICD-10-CM

## 2019-07-05 DIAGNOSIS — D631 Anemia in chronic kidney disease: Secondary | ICD-10-CM | POA: Diagnosis not present

## 2019-07-05 NOTE — Patient Outreach (Signed)
Red Flag Emmi was assigned to Tamara Stein at [facility].

## 2019-07-05 NOTE — Patient Outreach (Signed)
Member assessed for potential [facility] Care Management needs as a benefit of  [place] [employer].  Member is currently receiving skilled therapy at [facility].  Member discussed in weekly telephonic IDT meeting with facility staff, [facility] team, and writer.  Facility reports member is from home with supportive family. Per facility, family reports member has been declining as of late. [facility] Director suggests palliative services upon SNF discharge. Member wheelchair bound prior to admission. She is alert and oriented x4. Per facility dc planner, brother in law is gathering records to apply for Medicaid.  Will continue to follow for disposition plans, progression, and for potential [facility] Care Management needs.    Marthenia Rolling, MSN-Ed, RN,BSN [place] [facility] Acute Care Coordinator [phone] [facility]) [phone]  (Toll free office)

## 2019-07-05 NOTE — Chronic Care Management (AMB) (Signed)
  Care Management   Care Coordination Note  07/05/2019 Name: Tamara Stein MRN: [id number] DOB: 07/22/1941  Received request to reach out to patient regarding Red Emmi Flag below. Patient has been engaged by Marthenia Rolling, RN with [facility] who reports that patient is receiving skilled therapy at [facility]. Palliative services were recommended upon discharge but no plans have been made.     Follow up plan:  RN will reach out to A. [place] Crane, RN to see if she plans to continue following [name] or if the [facility] team should take over.    Patient is tentatively scheduled for a RN Care Management call on 07/09/2019  Chong Sicilian, BSN, RN-BC [place] [facility] Management Direct Dial: [phone]

## 2019-07-06 ENCOUNTER — Non-Acute Institutional Stay (SKILLED_NURSING_FACILITY): Payer: Medicare Other | Admitting: Internal Medicine

## 2019-07-06 ENCOUNTER — Encounter: Payer: Self-pay | Admitting: Internal Medicine

## 2019-07-06 DIAGNOSIS — I10 Essential (primary) hypertension: Secondary | ICD-10-CM | POA: Diagnosis not present

## 2019-07-06 DIAGNOSIS — N179 Acute kidney failure, unspecified: Secondary | ICD-10-CM

## 2019-07-06 DIAGNOSIS — J90 Pleural effusion, not elsewhere classified: Secondary | ICD-10-CM | POA: Diagnosis not present

## 2019-07-06 DIAGNOSIS — I5033 Acute on chronic diastolic (congestive) heart failure: Secondary | ICD-10-CM | POA: Diagnosis not present

## 2019-07-06 DIAGNOSIS — N1832 Chronic kidney disease, stage 3b: Secondary | ICD-10-CM

## 2019-07-06 DIAGNOSIS — R6520 Severe sepsis without septic shock: Secondary | ICD-10-CM | POA: Diagnosis not present

## 2019-07-06 DIAGNOSIS — G119 Hereditary ataxia, unspecified: Secondary | ICD-10-CM

## 2019-07-06 DIAGNOSIS — I4891 Unspecified atrial fibrillation: Secondary | ICD-10-CM

## 2019-07-06 DIAGNOSIS — A498 Other bacterial infections of unspecified site: Secondary | ICD-10-CM

## 2019-07-06 DIAGNOSIS — N170 Acute kidney failure with tubular necrosis: Secondary | ICD-10-CM

## 2019-07-06 DIAGNOSIS — A419 Sepsis, unspecified organism: Secondary | ICD-10-CM

## 2019-07-06 DIAGNOSIS — E785 Hyperlipidemia, unspecified: Secondary | ICD-10-CM | POA: Diagnosis not present

## 2019-07-06 DIAGNOSIS — E039 Hypothyroidism, unspecified: Secondary | ICD-10-CM | POA: Diagnosis not present

## 2019-07-06 DIAGNOSIS — N189 Chronic kidney disease, unspecified: Secondary | ICD-10-CM

## 2019-07-06 NOTE — Progress Notes (Signed)
: Provider:  Noah Delaine. Sheppard Coil MD Location:  [facility] Room Number: 154/W Place of Service:  SNF ([id number])  PCP: Janora Norlander, DO Patient Care Team: Janora Norlander, DO as PCP - General (Family Medicine) Ilean China, RN as Registered Nurse  Extended Emergency Contact Information Primary Emergency Contact: Kirby Crigler, [place] [address] Johnnette Litter of [place] Phone: [phone] Relation: Sister Secondary Emergency Contact: Kidd,[name] Address: [address]          [place], [place] [address] Johnnette Litter of [place] Phone: [phone] Mobile Phone: [phone] Relation: Friend     Allergies: Sulfa antibiotics, Codeine, Doxycycline, Erythromycin, Ibuprofen, Levaquin [levofloxacin], and Penicillins  Chief Complaint  Patient presents with  . New Admit To SNF    New Admission Visit    HPI: Patient is 79 y.o. female with anxiety, depression, hypertension, chronic kidney disease stage II, spinocerebellar degeneration hypothyroidism and diverticulitis who presented to the ED with weakness nausea and vague abdominal comfort over the prior few days, with no vomiting or diarrhea, but with shortness of breath mainly with exertion.  On arrival noted to be hypothermic with new onset atrial fib with RVR.  Patient is admitted to [facility] from 11/10-17 where she was treated with atrial fibrillation with RVR with IV Cardizem and converted to normal sinus rhythm.  Hospital course was complicated by UTI secondary to Enterobacter with sepsis and acute kidney injury which improved with IV fluids.  Patient also had acute on chronic diastolic congestive heart failure from fluid overload so she received several doses of IV Lasix with good urine output.  Patient is admitted to skilled nursing facility for OT/PT.  While at skilled nursing facility patient will be followed for hypertension treated with diltiazem Lasix hydralazine Imdur,  hypothyroidism treated with replacement, and spinocerebellar degeneration treated with physical therapy.  Past Medical History:  Diagnosis Date  . Allergy   . Anxiety   . Cataract   . 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 Chronic kidney disease   . Chronic knee pain   . Depression   . GERD (gastroesophageal reflux disease)   . Hyperlipidemia   . Hypertension   . Thyroid disease     Past Surgical History:  Procedure Laterality Date  . ABDOMINAL HYSTERECTOMY    . CHOLECYSTECTOMY    . FLEXIBLE SIGMOIDOSCOPY N/A 06/05/2019   Procedure: FLEXIBLE SIGMOIDOSCOPY;  Surgeon: Rogene Houston, MD;  Location: [facility];  Service: Endoscopy;  Laterality: N/A;  . MOUTH SURGERY    . RIGHT ELBOW      Outpatient Encounter Medications as of 07/06/2019  Medication Sig  . acetaminophen (TYLENOL) 325 MG tablet Take 2 tablets (650 mg total) by mouth every 6 (six) hours as needed for mild pain, fever or headache (or Fever >/= 101).  Marland Kitchen ALPRAZolam (XANAX) 0.5 MG tablet Take 1 tablet (0.5 mg total) by mouth 3 (three) times daily as needed for up to 11 days for sleep or anxiety.  Marland Kitchen apixaban (ELIQUIS) 5 MG TABS tablet Take 1 tablet (5 mg total) by mouth 2 (two) times daily.  Marland Kitchen aspirin EC 81 MG tablet Take 1 tablet (81 mg total) by mouth daily with breakfast.  . atorvastatin (LIPITOR) 40 MG tablet Take 1 tablet (40 mg total) by mouth at bedtime. Do not restart until instructed by MD  . Calcium Carbonate-Vitamin D (CALTRATE 600+D PO) Take 1 tablet by mouth daily.  Marland Kitchen  cetirizine (ZYRTEC) 10 MG chewable tablet Chew 10 mg by mouth daily.  . diclofenac sodium (VOLTAREN) 1 % GEL Apply 4 g topically 4 (four) times daily. (for arthritis to REPLACE oral diclofenac)  . diltiazem (CARDIZEM CD) 240 MG 24 hr capsule Take 1 capsule (240 mg total) by mouth daily.  . DULoxetine (CYMBALTA) 60 MG capsule Take 1 capsule (60 mg total) by mouth daily.  . famotidine (PEPCID AC) 10 MG tablet Take 1 tablet (10 mg total) by mouth  daily.  . furosemide (LASIX) 20 MG tablet Take 1 tablet (20 mg total) by mouth daily. For swelling/Fluid  . hydrALAZINE (APRESOLINE) 50 MG tablet Take 1 tablet (50 mg total) by mouth 3 (three) times daily. For BP  . isosorbide mononitrate (IMDUR) 30 MG 24 hr tablet Take 1 tablet (30 mg total) by mouth daily.  Marland Kitchen levothyroxine (SYNTHROID) 50 MCG tablet TAKE 1 TABLET ONCE DAILY  . meclizine (ANTIVERT) 25 MG tablet Take 25 mg by mouth 3 (three) times daily as needed for dizziness.  . Multiple Vitamin (MULTIVITAMIN WITH MINERALS) TABS tablet Take 1 tablet by mouth daily.  . NON FORMULARY Diet: Regular, NAS Consistent Carbohydrate  . omeprazole (PRILOSEC) 40 MG capsule TAKE 1 CAPSULE DAILY  . ondansetron (ZOFRAN) 4 MG tablet Take 1 tablet (4 mg total) by mouth every 6 (six) hours as needed for nausea.  . Probiotic Product (RISA-BID PROBIOTIC) TABS Take 1 tablet by mouth daily.  . raloxifene (EVISTA) 60 MG tablet Take 1 tablet (60 mg total) by mouth daily.  . [DISCONTINUED] atorvastatin (LIPITOR) 40 MG tablet Take 1 tablet (40 mg total) by mouth at bedtime.  . [DISCONTINUED] Bacillus Coagulans-Inulin (ALIGN PREBIOTIC-PROBIOTIC) 5-1.25 MG-GM CHEW Chew 1 tablet by mouth daily.  . [DISCONTINUED] DULoxetine (CYMBALTA) 60 MG capsule Take 1 capsule (60 mg total) by mouth daily.   No facility-administered encounter medications on file as of 07/06/2019.      No orders of the defined types were placed in this encounter.   Immunization History  Administered Date(s) Administered  . Fluad Quad(high Dose 65+) 05/12/2019  . Influenza Split 05/02/2011  . Influenza, High Dose Seasonal PF 05/18/2017, 05/13/2018  . Influenza, Quadrivalent, Recombinant, Inj, Pf 05/02/2011, 05/31/2013, 06/01/2014, 05/11/2015, 04/28/2016  . Influenza,inj,Quad PF,6+ Mos 05/11/2015, 04/28/2016  . Influenza-Unspecified 05/24/2012, 05/31/2013, 06/01/2014  . Pneumococcal Conjugate-13 06/01/2014  . Pneumococcal Polysaccharide-23  11/21/2008, 04/28/2016  . Tdap 05/22/2005    Social History   Tobacco Use  . Smoking status: Never Smoker  . Smokeless tobacco: Never Used  Substance Use Topics  . Alcohol use: No    Family history is   Family History  Problem Relation Age of Onset  . Arthritis Sister   . Hyperlipidemia Sister   . Hypertension Sister   . Cancer Brother   . Diabetes Brother   . Heart disease Brother       Review of Systems  GENERAL:  no fevers, fatigue, appetite changes SKIN: No itching, or rash EYES: No eye pain, redness, discharge EARS: No earache, tinnitus, change in hearing NOSE: No congestion, drainage or bleeding  MOUTH/THROAT: No mouth or tooth pain, No sore throat RESPIRATORY: No cough, wheezing, SOB CARDIAC: No chest pain, palpitations, lower extremity edema  GI: No abdominal pain, No N/V/D or constipation, No heartburn or reflux  GU: No dysuria, frequency or urgency, or incontinence  MUSCULOSKELETAL: No unrelieved bone/joint pain NEUROLOGIC: No headache, dizziness or focal weakness PSYCHIATRIC: No c/o anxiety or sadness   Vitals:   07/06/19 0906  BP: (!) 103/56  Pulse: 72  Resp: 18  Temp: (!) 97.1 F (36.2 C)    SpO2 Readings from Last 1 Encounters:  07/01/19 99%   Body mass index is 31.24 kg/m.     Physical Exam  GENERAL APPEARANCE: Alert, conversant,  No acute distress.  SKIN: No diaphoresis rash HEAD: Normocephalic, atraumatic  EYES: Conjunctiva/lids clear. Pupils round, reactive. EOMs intact.  EARS: External exam WNL, canals clear. Hearing grossly normal.  NOSE: No deformity or discharge.  MOUTH/THROAT: Lips w/o lesions  RESPIRATORY: Breathing is even, unlabored. Lung sounds are clear   CARDIOVASCULAR: Heart RRR no murmurs, rubs or gallops. No peripheral edema.   GASTROINTESTINAL: Abdomen is soft, non-tender, not distended w/ normal bowel sounds. GENITOURINARY: Bladder non tender, not distended  MUSCULOSKELETAL: No abnormal joints or musculature  NEUROLOGIC:  Cranial nerves 2-12 grossly intact. Moves all extremities  PSYCHIATRIC: Mood and affect appropriate to situation, no behavioral issues  Patient Active Problem List   Diagnosis Date Noted  . Atrial fibrillation with RVR ([place]) 06/22/2019  . Sepsis ([place]) 06/21/2019  . Ascending aortic aneurysm ([place]) 06/13/2019  . Pleural effusion 06/08/2019  . Dyspnea 06/07/2019  . Diarrhea   . Abnormal LFTs   . Symptomatic anemia 06/01/2019  . Nausea vomiting and diarrhea   . Lesion of spleen   . Diverticulitis 05/04/2019  . Internal impingement of left shoulder 12/24/2017  . Anxiety 04/20/2017  . 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 Incontinence of urine 04/20/2017  . UTI (urinary tract infection) 07/15/2016  . Abdominal pain 07/15/2016  . Colitis 07/15/2016  . Syncope and collapse 04/26/2016  . Lower urinary tract infectious disease 04/26/2016  . Fall   . Daytime somnolence 03/10/2016  . Diplopia 03/10/2016  . Insomnia 03/10/2016  . Sleep apnea in adult 03/10/2016  . Imbalance 03/07/2016  . Migraine 03/07/2016  . Tremor 03/07/2016  . Pain of both shoulder joints 02/29/2016  . Arthritis 09/24/2015  . Constipation 06/05/2015  . Depression 06/05/2015  . Hypothyroid 06/05/2015  . 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 GERD (gastroesophageal reflux disease)   . Essential hypertension with goal blood pressure less than 140/90   . Hyperlipidemia   . Cerebellar degeneration ([place])   . CKD (chronic kidney disease) stage 3, GFR 30-59 ml/min ([facility]) 12/02/2013      Labs reviewed: Basic Metabolic Panel:    Component Value Date/Time   NA 141 06/29/2019 1636   NA 141 05/18/2019 1510   K 3.7 06/29/2019 1636   CL 96 (L) 06/29/2019 1636   CO2 32 06/29/2019 1636   GLUCOSE 128 (H) 06/29/2019 1636   BUN 18 06/29/2019 1636   BUN 38 (H) 05/18/2019 1510   CREATININE 1.33 (H) 06/29/2019 1636   CALCIUM 9.2 06/29/2019 1636   PROT 6.7 06/29/2019 1636   PROT 6.6 05/18/2019 1510    ALBUMIN 3.3 (L) 06/29/2019 1636   ALBUMIN 4.2 05/18/2019 1510   AST 41 06/29/2019 1636   ALT 48 (H) 06/29/2019 1636   ALKPHOS 85 06/29/2019 1636   BILITOT 0.6 06/29/2019 1636   BILITOT 0.3 05/18/2019 1510   GFRNONAA 38 (L) 06/29/2019 1636   GFRAA 44 (L) 06/29/2019 1636    Recent Labs    06/07/19 1718  06/24/19 0405  06/26/19 0828 06/27/19 0513 06/28/19 0622 06/29/19 1636  NA  --    < > 139   < > 140  --  142 141  K  --    < > 3.8   < > 3.6  --  3.7 3.7  CL  --    < > 106   < > 106  --  96* 96*  CO2  --    < > 21*   < > 25  --  33* 32  GLUCOSE  --    < > 125*   < > 129*  --  141* 128*  BUN  --    < > 40*   < > 30*  --  18 18  CREATININE  --    < > 2.12*   < > 1.26* 1.11* 1.22* 1.33*  CALCIUM  --    < > 7.6*   < > 8.4*  --  9.2 9.2  MG 1.4*  --  1.6*  --   --   --  1.4*  --   PHOS 3.1  --   --   --   --   --   --   --    < > = values in this interval not displayed.   Liver Function Tests: Recent Labs    06/07/19 1530 06/21/19 1857 06/29/19 1636  AST 22 28 41  ALT 71* 22 48*  ALKPHOS 72 68 85  BILITOT 0.7 0.9 0.6  PROT 6.0* 6.2* 6.7  ALBUMIN 3.2* 3.1* 3.3*   Recent Labs    11/14/18 2016 05/04/19 1653 05/30/19 2305  LIPASE 36 33 31   No results for input(s): AMMONIA in the last 8760 hours. CBC: Recent Labs    06/21/19 1857  06/23/19 0441  06/27/19 0513 06/28/19 0622 06/29/19 1636  WBC 13.7*   < > 10.2   < > 8.1 7.9 10.2  NEUTROABS 12.6*  --  7.9*  --   --   --  7.2  HGB 10.8*   < > 8.8*   < > 9.8* 10.7* 11.9*  HCT 35.2*   < > 28.8*   < > 31.2* 34.7* 38.2  MCV 97.8   < > 98.3   < > 96.3 95.9 96.0  PLT 302   < > 221   < > 221 240 270   < > = values in this interval not displayed.   Lipid Recent Labs    06/21/19 1858  TRIG 119    Cardiac Enzymes: No results for input(s): CKTOTAL, CKMB, CKMBINDEX, TROPONINI in the last 8760 hours. BNP: Recent Labs    06/07/19 1530  BNP 84.0   No results found for: [facility] Lab Results  Component Value  Date   HGBA1C 5.2 05/09/2016   Lab Results  Component Value Date   TSH 4.733 (H) 06/22/2019   Lab Results  Component Value Date   HWTUUEKC00 349 06/02/2019   Lab Results  Component Value Date   FOLATE 26.9 06/02/2019   Lab Results  Component Value Date   FERRITIN 68 06/21/2019    Imaging and Procedures obtained prior to SNF admission: No results found.   Not all labs, radiology exams or other studies done during hospitalization come through on my EPIC note; however they are reviewed by me.    Assessment and Plan  Atrial fibrillation with RVR-new onset echo from last month with preserved LV systolic function; treated with IV Cardizem and converted back to normal sinus rhythm then transition to p.o. Cardizem; chads vas score is 4. SNF-continue diltiazem 240 mg daily and prophylaxis with Eliquis 5 mg twice daily  Enterobacter UTI/sepsis-patient was treated with Rocephin, which Enterobacter was worse distant to the patient did well and it was  suspected her urine culture might have been colonization  Acute kidney injury on chronic kidney disease stage II/hypokalemia-felt secondary to ATN; renal ultrasound did not show any hydronephrosis and lisinopril was held with improved creatinine SNF-follow-up BMP  Acute on chronic diastolic congestive heart failure-brought on by treatment for AKI; at baseline patient is on Lasix which was held secondary to AKI.  Patient was treated with several doses of IV Lasix with improvement SNF-continue Lasix 20 mg daily; follow-up BMP  Right pleural effusion-noted on chest x-ray, felt likely to CHF; patient was restarted on diuretics SNF-follow vital signs and O2 saturation  Hypertension SNF-lisinopril 10 mg is  hold due to AKI; continue diltiazem 240 mg daily, Lasix 20 mg daily, hydralazine 50 mg 3 times daily  Hyperlipidemia SNF-not stated as uncontrolled; continue Lipitor 40 mg daily  Hypothyroidism SNF/1 not stated as uncontrolled;  continue Synthroid 50 mg daily  Spinal cellular degeneration-with ambulatory [facility] SNF-admitted for OT/PT   Time spent greater than 45 minutes;> 50% of time with patient was spent reviewing records, labs, tests and studies, counseling and developing plan of care  Hennie Duos, MD

## 2019-07-08 ENCOUNTER — Encounter: Payer: Self-pay | Admitting: Internal Medicine

## 2019-07-08 DIAGNOSIS — I5032 Chronic diastolic (congestive) heart failure: Secondary | ICD-10-CM | POA: Insufficient documentation

## 2019-07-08 DIAGNOSIS — A498 Other bacterial infections of unspecified site: Secondary | ICD-10-CM | POA: Insufficient documentation

## 2019-07-08 DIAGNOSIS — N179 Acute kidney failure, unspecified: Secondary | ICD-10-CM | POA: Insufficient documentation

## 2019-07-08 DIAGNOSIS — I5033 Acute on chronic diastolic (congestive) heart failure: Secondary | ICD-10-CM | POA: Insufficient documentation

## 2019-07-11 ENCOUNTER — Other Ambulatory Visit: Payer: Self-pay

## 2019-07-11 ENCOUNTER — Emergency Department (HOSPITAL_COMMUNITY): Payer: Medicare Other

## 2019-07-11 ENCOUNTER — Inpatient Hospital Stay
Admission: RE | Admit: 2019-07-11 | Discharge: 2019-07-20 | Disposition: A | Discharge status: Skilled Nursing Facility | Payer: Medicare Other | Source: Ambulatory Visit | Attending: Internal Medicine | Admitting: Internal Medicine

## 2019-07-11 ENCOUNTER — Emergency Department (HOSPITAL_COMMUNITY)
Admission: EM | Admit: 2019-07-11 | Discharge: 2019-07-11 | Disposition: A | Discharge status: Home / Self Care | Payer: Medicare Other | Attending: Emergency Medicine | Admitting: Emergency Medicine

## 2019-07-11 ENCOUNTER — Non-Acute Institutional Stay (SKILLED_NURSING_FACILITY): Payer: Medicare Other | Admitting: Internal Medicine

## 2019-07-11 ENCOUNTER — Encounter (HOSPITAL_COMMUNITY): Payer: Self-pay | Admitting: Emergency Medicine

## 2019-07-11 DIAGNOSIS — R072 Precordial pain: Secondary | ICD-10-CM | POA: Insufficient documentation

## 2019-07-11 DIAGNOSIS — Z7901 Long term (current) use of anticoagulants: Secondary | ICD-10-CM | POA: Insufficient documentation

## 2019-07-11 DIAGNOSIS — E039 Hypothyroidism, unspecified: Secondary | ICD-10-CM | POA: Insufficient documentation

## 2019-07-11 DIAGNOSIS — F419 Anxiety disorder, unspecified: Secondary | ICD-10-CM

## 2019-07-11 DIAGNOSIS — I5032 Chronic diastolic (congestive) heart failure: Secondary | ICD-10-CM | POA: Insufficient documentation

## 2019-07-11 DIAGNOSIS — R0789 Other chest pain: Secondary | ICD-10-CM | POA: Diagnosis not present

## 2019-07-11 DIAGNOSIS — Z79899 Other long term (current) drug therapy: Secondary | ICD-10-CM | POA: Insufficient documentation

## 2019-07-11 DIAGNOSIS — I5033 Acute on chronic diastolic (congestive) heart failure: Secondary | ICD-10-CM | POA: Diagnosis not present

## 2019-07-11 DIAGNOSIS — N1832 Chronic kidney disease, stage 3b: Secondary | ICD-10-CM

## 2019-07-11 DIAGNOSIS — I4891 Unspecified atrial fibrillation: Secondary | ICD-10-CM

## 2019-07-11 DIAGNOSIS — I10 Essential (primary) hypertension: Secondary | ICD-10-CM

## 2019-07-11 DIAGNOSIS — N183 Chronic kidney disease, stage 3 unspecified: Secondary | ICD-10-CM | POA: Diagnosis not present

## 2019-07-11 DIAGNOSIS — R079 Chest pain, unspecified: Secondary | ICD-10-CM | POA: Diagnosis present

## 2019-07-11 DIAGNOSIS — I130 Hypertensive heart and chronic kidney disease with heart failure and stage 1 through stage 4 chronic kidney disease, or unspecified chronic kidney disease: Secondary | ICD-10-CM | POA: Insufficient documentation

## 2019-07-11 LAB — COMPREHENSIVE METABOLIC PANEL
ALT: 28 U/L (ref 0–44)
AST: 27 U/L (ref 15–41)
Albumin: 3.7 g/dL (ref 3.5–5.0)
Alkaline Phosphatase: 68 U/L (ref 38–126)
Anion gap: 13 (ref 5–15)
BUN: 24 mg/dL — ABNORMAL HIGH (ref 8–23)
CO2: 25 mmol/L (ref 22–32)
Calcium: 9.1 mg/dL (ref 8.9–10.3)
Chloride: 98 mmol/L (ref 98–111)
Creatinine, Ser: 1.39 mg/dL — ABNORMAL HIGH (ref 0.44–1.00)
GFR calc Af Amer: 42 mL/min — ABNORMAL LOW (ref >60)
GFR calc non Af Amer: 36 mL/min — ABNORMAL LOW (ref >60)
Glucose, Bld: 128 mg/dL — ABNORMAL HIGH (ref 70–99)
Potassium: 3.6 mmol/L (ref 3.5–5.1)
Sodium: 136 mmol/L (ref 135–145)
Total Bilirubin: 0.4 mg/dL (ref 0.3–1.2)
Total Protein: 6.8 g/dL (ref 6.5–8.1)

## 2019-07-11 LAB — TROPONIN I (HIGH SENSITIVITY)
Troponin I (High Sensitivity): 6 ng/L (ref <18)
Troponin I (High Sensitivity): 6 ng/L (ref <18)

## 2019-07-11 LAB — CBC WITH DIFFERENTIAL/PLATELET
Abs Immature Granulocytes: 0.06 10*3/uL (ref 0.00–0.07)
Basophils Absolute: 0.1 10*3/uL (ref 0.0–0.1)
Basophils Relative: 1 %
Eosinophils Absolute: 0.1 10*3/uL (ref 0.0–0.5)
Eosinophils Relative: 1 %
HCT: 40.1 % (ref 36.0–46.0)
Hemoglobin: 12.6 g/dL (ref 12.0–15.0)
Immature Granulocytes: 1 %
Lymphocytes Relative: 16 %
Lymphs Abs: 1.5 10*3/uL (ref 0.7–4.0)
MCH: 29.9 pg (ref 26.0–34.0)
MCHC: 31.4 g/dL (ref 30.0–36.0)
MCV: 95.2 fL (ref 80.0–100.0)
Monocytes Absolute: 1.1 10*3/uL — ABNORMAL HIGH (ref 0.1–1.0)
Monocytes Relative: 12 %
Neutro Abs: 6.5 10*3/uL (ref 1.7–7.7)
Neutrophils Relative %: 69 %
Platelets: 248 10*3/uL (ref 150–400)
RBC: 4.21 MIL/uL (ref 3.87–5.11)
RDW: 15 % (ref 11.5–15.5)
WBC: 9.4 10*3/uL (ref 4.0–10.5)
nRBC: 0 % (ref 0.0–0.2)

## 2019-07-11 LAB — D-DIMER, QUANTITATIVE: D-Dimer, Quant: 0.65 ug/mL-FEU — ABNORMAL HIGH (ref 0.00–0.50)

## 2019-07-11 MED ORDER — SODIUM CHLORIDE 0.9 % IV SOLN
INTRAVENOUS | Status: DC
  Administered 2019-07-11: 12:00:00 via INTRAVENOUS

## 2019-07-11 MED ORDER — LORAZEPAM 2 MG/ML IJ SOLN: 1.0000 mg | Freq: Once | INTRAMUSCULAR | Status: DC

## 2019-07-11 MED ORDER — SODIUM CHLORIDE 0.9 % IV BOLUS
500.0000 mL | Freq: Once | INTRAVENOUS | Status: AC
  Administered 2019-07-11: 500 mL via INTRAVENOUS

## 2019-07-11 NOTE — Discharge Instructions (Signed)
Work-up here in the emergency department with 2 cardiac markers that were negative for any concerns for acute heart attack.  Also ruled out any concerns for blood clots in the lungs.  And chest x-ray was negative.  Symptoms seem to be chest wall pain in nature.  Follow back up with your regular doctor.  And your providers at the [facility].  Patient stable for return to [facility].

## 2019-07-11 NOTE — ED Provider Notes (Signed)
[facility] EMERGENCY DEPARTMENT Provider Note   CSN: [id number] Arrival date & time: 07/11/19  6644     History   Chief Complaint Chief Complaint  Patient presents with   Chest Pain    HPI Tamara Stein is a 78 y.o. female.     Patient sent from [facility].  Patient with anterior chest pain that started at 6 this morning.  Chest pain is only present with breathing.  Patient is already on the blood thinner Eliquis.  Patient is a DNR.  Triage stated the patient felt as if she had an elephant on her chest.  As as per EMS.  Patient just complained of chest discomfort with breathing when I saw her.  Patient with admission to the hospital for sepsis and November 10.  Discharged to nursing facility on November 18.     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[place])    Chronic kidney disease    Chronic knee pain    Depression    GERD (gastroesophageal reflux disease)    Hyperlipidemia    Hypertension    Thyroid disease     Patient Active Problem List   Diagnosis Date Noted   Infection due to Enterobacter species 07/08/2019   Acute kidney injury superimposed on CKD ([place]) 07/08/2019   Acute on chronic diastolic (congestive) heart failure ([place]) 07/08/2019   Atrial fibrillation with RVR ([place]) 06/22/2019   Sepsis ([place]) 06/21/2019   Ascending aortic aneurysm ([facility]) 06/13/2019   Pleural effusion 06/08/2019   Dyspnea 06/07/2019   Diarrhea    Abnormal LFTs    Symptomatic anemia 06/01/2019   Nausea vomiting and diarrhea    Lesion of spleen    Diverticulitis 05/04/2019   Internal impingement of left shoulder 12/24/2017   Anxiety 04/20/2017   Cerebellar dysfunction 04/20/2017   Incontinence of urine 04/20/2017   UTI (urinary tract infection) 07/15/2016   Abdominal pain 07/15/2016   Colitis 07/15/2016   Syncope and collapse 04/26/2016   Lower urinary tract infectious disease  04/26/2016   Fall    Daytime somnolence 03/10/2016   Diplopia 03/10/2016   Insomnia 03/10/2016   Sleep apnea in adult 03/10/2016   Imbalance 03/07/2016   Migraine 03/07/2016   Tremor 03/07/2016   Pain of both shoulder joints 02/29/2016   Arthritis 09/24/2015   Constipation 06/05/2015   Depression 06/05/2015   Hypothyroid 06/05/2015   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      Home Medications    Prior to Admission medications   Medication Sig Start Date End Date Taking? Authorizing Provider  acetaminophen (TYLENOL) 325 MG tablet Take 2 tablets (650 mg total) by mouth every 6 (six) hours as needed for mild pain, fever or headache (or Fever >/= 101). 06/05/19  Roxan Hockey, MD  ALPRAZolam Duanne Moron) 0.5 MG tablet Take 1 tablet (0.5 mg total) by mouth 3 (three) times daily as needed for up to 11 days for sleep or anxiety. 07/04/19 07/15/19  Gerlene Fee, NP  apixaban (ELIQUIS) 5 MG TABS tablet Take 1 tablet (5 mg total) by mouth 2 (two) times daily. 06/28/19   Kathie Dike, MD  aspirin EC 81 MG tablet Take 1 tablet (81 mg total) by mouth daily with breakfast. 06/05/19   Denton Brick, Courage, MD  atorvastatin (LIPITOR) 40 MG tablet Take 1 tablet (40 mg total) by mouth at bedtime. Do not restart until instructed by MD 05/14/19   Orson Eva, MD  Calcium  Carbonate-Vitamin D (CALTRATE 600+D PO) Take 1 tablet by mouth daily.    [provider]  cetirizine (ZYRTEC) 10 MG chewable tablet Chew 10 mg by mouth daily.    [provider]  diclofenac sodium (VOLTAREN) 1 % GEL Apply 4 g topically 4 (four) times daily. (for arthritis to REPLACE oral diclofenac) 06/13/19   Ronnie Doss M, DO  diltiazem (CARDIZEM CD) 240 MG 24 hr capsule Take 1 capsule (240 mg total) by mouth daily. 06/28/19   Kathie Dike, MD  DULoxetine (CYMBALTA) 60 MG capsule Take 1 capsule (60 mg total) by mouth daily. 04/27/19   Janora Norlander, DO  famotidine (PEPCID AC) 10 MG tablet Take 1 tablet (10 mg total) by mouth daily. 06/20/19   Janora Norlander, DO  furosemide (LASIX) 20 MG tablet Take 1 tablet (20 mg total) by mouth daily. For swelling/Fluid 06/09/19   Roxan Hockey, MD  hydrALAZINE (APRESOLINE) 50 MG tablet Take 1 tablet (50 mg total) by mouth 3 (three) times daily. For BP 06/05/19   Emokpae, Courage, MD  isosorbide mononitrate (IMDUR) 30 MG 24 hr tablet Take 1 tablet (30 mg total) by mouth daily. 06/06/19   Roxan Hockey, MD  levothyroxine (SYNTHROID) 50 MCG tablet TAKE 1 TABLET ONCE DAILY 04/26/19   Ronnie Doss M, DO  meclizine (ANTIVERT) 25 MG tablet Take 25 mg by mouth 3 (three) times daily as needed for dizziness.    [provider]  Multiple Vitamin (MULTIVITAMIN WITH MINERALS) TABS tablet Take 1 tablet by mouth daily.    [provider]  NON FORMULARY Diet: Regular, NAS Consistent Carbohydrate    [provider]  omeprazole (PRILOSEC) 40 MG capsule TAKE 1 CAPSULE DAILY 04/26/19   Ronnie Doss M, DO  ondansetron (ZOFRAN) 4 MG tablet Take 1 tablet (4 mg total) by mouth every 6 (six) hours as needed for nausea. 06/09/19   Roxan Hockey, MD  Probiotic Product (RISA-BID PROBIOTIC) TABS Take 1 tablet by mouth daily.    [provider]  raloxifene (EVISTA) 60 MG tablet Take 1 tablet (60 mg total)  by mouth daily. 04/27/19   Janora Norlander, DO  atorvastatin (LIPITOR) 40 MG tablet Take 1 tablet (40 mg total) by mouth at bedtime. 11/05/18   Janora Norlander, DO  DULoxetine (CYMBALTA) 60 MG capsule Take 1 capsule (60 mg total) by mouth daily. 11/05/18   Janora Norlander, DO    Family History Family History  Problem Relation Age of Onset   Arthritis Sister    Hyperlipidemia Sister    Hypertension Sister    Cancer Brother    Diabetes Brother    Heart disease Brother     Social History Social History   Tobacco Use   Smoking status: Never Smoker   Smokeless tobacco: Never Used  Substance Use Topics   Alcohol use: No   Drug use: No     Allergies   Sulfa antibiotics, Codeine, Doxycycline, Erythromycin, Ibuprofen, Levaquin [levofloxacin], and Penicillins   Review of Systems Review of Systems  Constitutional: Negative for chills and fever.  HENT: Negative for congestion, rhinorrhea and sore throat.   Eyes: Negative for visual disturbance.  Respiratory: Negative for cough and shortness of breath.   Cardiovascular: Positive for chest pain. Negative for leg swelling.  Gastrointestinal: Negative for abdominal pain, diarrhea, nausea and vomiting.  Genitourinary: Negative for dysuria.  Musculoskeletal: Negative for back pain and neck pain.  Skin: Negative for rash.  Neurological: Negative for dizziness, light-headedness and headaches.  Hematological: Does not bruise/bleed easily.  Psychiatric/Behavioral: Negative for confusion.     Physical Exam Updated Vital Signs BP (!) 151/91    Pulse (!) 103    Temp 98.3 F (36.8 C) (Oral)    Resp (!) 24    Ht 1.524 m (5')    Wt 84.4 kg    SpO2 97%    BMI 36.33 kg/m   Physical Exam Vitals signs and nursing note reviewed.  Constitutional:      General: She is not in acute distress.    Appearance: Normal appearance. She is well-developed.  HENT:     Head: Normocephalic and atraumatic.  Eyes:      Conjunctiva/sclera: Conjunctivae normal.  Neck:     Musculoskeletal: Neck supple.  Cardiovascular:     Rate and Rhythm: Regular rhythm. Tachycardia present.     Heart sounds: No murmur.  Pulmonary:     Effort: Pulmonary effort is normal. No respiratory distress.     Breath sounds: Normal breath sounds.  Abdominal:     Palpations: Abdomen is soft.     Tenderness: There is no abdominal tenderness.  Musculoskeletal: Normal range of motion.  Skin:    General: Skin is warm and dry.  Neurological:     General: No focal deficit present.     Mental Status: She is alert and oriented to person, place, and time.      ED Treatments / Results  Labs (all labs ordered are listed, but only abnormal results are displayed) Labs Reviewed  CBC WITH DIFFERENTIAL/PLATELET - Abnormal; Notable for the following components:      Result Value   Monocytes Absolute 1.1 (*)    All other components within normal limits  COMPREHENSIVE METABOLIC PANEL - Abnormal; Notable for the following components:   Glucose, Bld 128 (*)    BUN 24 (*)    Creatinine, Ser 1.39 (*)    GFR calc non Af Amer 36 (*)    GFR calc Af Amer 42 (*)    All other components within normal limits  D-DIMER, QUANTITATIVE (NOT AT [facility]) - Abnormal; Notable for the following components:   D-Dimer, Quant 0.65 (*)    All other components within normal limits  URINALYSIS, ROUTINE W REFLEX MICROSCOPIC  TROPONIN I (HIGH SENSITIVITY)  TROPONIN I (HIGH SENSITIVITY)    EKG EKG Interpretation  Date/Time:  Monday July 11 2019 08:48:45 EST Ventricular Rate:  107 PR Interval:    QRS Duration: 95 QT Interval:  338 QTC Calculation: 451 R Axis:   89 Text Interpretation: Sinus tachycardia Borderline right axis deviation Low voltage, precordial leads Minimal ST elevation, inferior leads No significant change since last tracing Interpretation limited secondary to artifact Confirmed by Fredia Sorrow [phone]) on 07/11/2019 8:54:31  AM   Radiology Dg Chest [facility]  Result Date: 07/11/2019 CLINICAL DATA:  Chest pain EXAM: PORTABLE CHEST 1 VIEW COMPARISON:  June 21, 2019 FINDINGS: Mild pulmonary vascular congestion. Trace pleural effusions. No pneumothorax. Similar cardiomegaly. Large hiatal hernia. IMPRESSION: Mild pulmonary vascular congestion and trace pleural effusions. Similar cardiomegaly. Electronically Signed   By: Macy Mis M.D.   On: 07/11/2019 09:14    Procedures Procedures (including critical care time)  Medications Ordered in ED Medications  0.9 %  sodium chloride infusion (has no administration in time range)  sodium chloride 0.9 % bolus 500 mL (0 mLs Intravenous Stopped 07/11/19 1140)     Initial Impression / Assessment and Plan / ED Course  I have reviewed the triage vital signs and the nursing notes.  Pertinent labs & imaging results that were available during my care of the patient were reviewed by me and considered in my medical decision making (see chart for details).        Patient's work-up troponins x2 without any significant change.  Chest x-ray without any acute findings.  Patient with a slight tachycardia in the low 100s.  D-dimer was done to rule out pulmonary embolus.  Patient not a candidate for CT angio.  However the age corrected D-dimer was within normal limits.  Not suggestive of pulmonary embolus.  Patient's pain seems to be chest wall in nature.  Seems to hurt more when she breathes.  Patient stable for discharge back to nursing facility.  Final Clinical Impressions(s) / ED Diagnoses   Final diagnoses:  Precordial pain    ED Discharge Orders    None       Fredia Sorrow, MD 07/11/19 1415

## 2019-07-11 NOTE — ED Notes (Addendum)
Update given to Jill Poling ([place]) [phone].

## 2019-07-11 NOTE — Progress Notes (Signed)
This is an acute visit.  Level of care is skilled.  Facility as per nursing.  Chief complaint acute visit status post ER visit for chest pain.  History of present illness.  Patient is a 78 year old female with a history of anxiety as well as depression hypertension chronic kidney disease and spinal cerebellar degeneration as well as hypothyroidism and diverticulitis.  She was recently admitted to the facility after hospitalization for sepsis.  She was also treated for atrial fibrillation with rapid ventricular rate with IV Cardizem and converted to normal sinus rhythm.  She a had a UTI in the hospital and acute kidney injury that improved with antibiotics and IV fluids.  She also has diastolic CHF from the fluids so she received IV Lasix with good urine output.  Apparently this morning she complained to nursing staff of having chest pain-feeling like there was an elephant on her chest-she did go to the ER where work-up did not show any acute process.  Troponins x2 did not show any significant change-checks x-ray did not show any acute findings-D-dimer was done secondary to concerns for pulmonary embolus she was not a candidate for CT angio however age corrected D-dimer was within normal limits.  It was thought her pain was more chest wall in nature and thought to be worse when she took a deep breath.  She has returned to the facility she continues to be somewhat anxious and feels she is having some heartburn.  She does not complain of acute chest pain or shortness of breath.  Vital signs appear to be stable  Past Medical History:  Diagnosis Date  . Allergy   . Anxiety   . Cataract   . Cerebellar degeneration ([place])   . Chronic kidney disease   . Chronic knee pain   . Depression   . GERD (gastroesophageal reflux disease)   . Hyperlipidemia   . Hypertension   . Thyroid disease         Patient Active Problem List   Diagnosis Date Noted  . Infection due  to Enterobacter species 07/08/2019  . Acute kidney injury superimposed on CKD ([place]) 07/08/2019  . Acute on chronic diastolic (congestive) heart failure ([place]) 07/08/2019  . Atrial fibrillation with RVR ([place]) 06/22/2019  . Sepsis ([place]) 06/21/2019  . Ascending aortic aneurysm ([place]) 06/13/2019  . Pleural effusion 06/08/2019  . Dyspnea 06/07/2019  . Diarrhea   . Abnormal LFTs   . Symptomatic anemia 06/01/2019  . Nausea vomiting and diarrhea   . Lesion of spleen   . Diverticulitis 05/04/2019  . Internal impingement of left shoulder 12/24/2017  . Anxiety 04/20/2017  . 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 Incontinence of urine 04/20/2017  . UTI (urinary tract infection) 07/15/2016  . Abdominal pain 07/15/2016  . Colitis 07/15/2016  . Syncope and collapse 04/26/2016  . Lower urinary tract infectious disease 04/26/2016  . Fall   . Daytime somnolence 03/10/2016  . Diplopia 03/10/2016  . Insomnia 03/10/2016  . Sleep apnea in adult 03/10/2016  . Imbalance 03/07/2016  . Migraine 03/07/2016  . Tremor 03/07/2016  . Pain of both shoulder joints 02/29/2016  . Arthritis 09/24/2015  . Constipation 06/05/2015  . Depression 06/05/2015  . Hypothyroid 06/05/2015  . 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 GERD (gastroesophageal reflux disease)   . Essential hypertension with goal blood pressure less than 140/90   . Hyperlipidemia   . Cerebellar degeneration ([place])   . CKD (chronic kidney disease) stage 3, GFR 30-59 ml/min ([facility]) 12/02/2013  Past Surgical History:  Procedure Laterality Date  . ABDOMINAL HYSTERECTOMY    . CHOLECYSTECTOMY    . FLEXIBLE SIGMOIDOSCOPY N/A 06/05/2019   Procedure: FLEXIBLE SIGMOIDOSCOPY;  Surgeon: Rogene Houston, MD;  Location: [facility];  Service: Endoscopy;  Laterality: N/A;  . MOUTH SURGERY    . RIGHT ELBOW                  OB History   No obstetric history on file.      MEDICATIONS  Medication Sig Start Date  End Date Taking? Authorizing Provider  acetaminophen (TYLENOL) 325 MG tablet Take 2 tablets (650 mg total) by mouth every 6 (six) hours as needed for mild pain, fever or headache (or Fever >/= 101). 06/05/19   Roxan Hockey, MD  ALPRAZolam Duanne Moron) 0.5 MG tablet Take 1 tablet (0.5 mg total) by mouth 3 (three) times daily as needed for up to 11 days for sleep or anxiety. 07/04/19 07/15/19  Gerlene Fee, NP  apixaban (ELIQUIS) 5 MG TABS tablet Take 1 tablet (5 mg total) by mouth 2 (two) times daily. 06/28/19   Kathie Dike, MD  aspirin EC 81 MG tablet Take 1 tablet (81 mg total) by mouth daily with breakfast. 06/05/19   Denton Brick, Courage, MD  atorvastatin (LIPITOR) 40 MG tablet Take 1 tablet (40 mg total) by mouth at bedtime. Do not restart until instructed by MD 05/14/19   Orson Eva, MD  Calcium Carbonate-Vitamin D (CALTRATE 600+D PO) Take 1 tablet by mouth daily.    [provider]  cetirizine (ZYRTEC) 10 MG chewable tablet Chew 10 mg by mouth daily.    [provider]  diclofenac sodium (VOLTAREN) 1 % GEL Apply 4 g topically 4 (four) times daily. (for arthritis to REPLACE oral diclofenac) 06/13/19   Ronnie Doss M, DO  diltiazem (CARDIZEM CD) 240 MG 24 hr capsule Take 1 capsule (240 mg total) by mouth daily. 06/28/19   Kathie Dike, MD  DULoxetine (CYMBALTA) 60 MG capsule Take 1 capsule (60 mg total) by mouth daily. 04/27/19   Janora Norlander, DO  famotidine (PEPCID AC) 10 MG tablet Take 1 tablet (10 mg total) by mouth daily. 06/20/19   Janora Norlander, DO  furosemide (LASIX) 20 MG tablet Take 1 tablet (20 mg total) by mouth daily. For swelling/Fluid 06/09/19   Roxan Hockey, MD  hydrALAZINE (APRESOLINE) 50 MG tablet Take 1 tablet (50 mg total) by mouth 3 (three) times daily. For BP 06/05/19   Emokpae, Courage, MD  isosorbide mononitrate (IMDUR) 30 MG 24 hr tablet Take 1 tablet (30 mg total) by mouth daily. 06/06/19   Roxan Hockey, MD  levothyroxine (SYNTHROID) 50 MCG tablet TAKE 1 TABLET ONCE DAILY 04/26/19   Ronnie Doss M, DO  meclizine (ANTIVERT) 25 MG tablet Take 25 mg by mouth 3 (three) times daily as needed for dizziness.    [provider]  Multiple Vitamin (MULTIVITAMIN WITH MINERALS) TABS tablet Take 1 tablet by mouth daily.    [provider]  NON FORMULARY Diet: Regular, NAS Consistent Carbohydrate    [provider]  omeprazole (PRILOSEC) 40 MG capsule TAKE 1 CAPSULE DAILY 04/26/19   Ronnie Doss M, DO  ondansetron (ZOFRAN) 4 MG tablet Take 1 tablet (4 mg total) by mouth every 6 (six) hours as needed for nausea. 06/09/19   Roxan Hockey, MD  Probiotic Product (RISA-BID PROBIOTIC) TABS Take 1 tablet by mouth daily.    [provider]  raloxifene (EVISTA)  60 MG tablet Take 1 tablet (60 mg total) by mouth daily. 04/27/19   Janora Norlander, DO  atorvastatin (LIPITOR) 40 MG tablet Take 1 tablet (40 mg total) by mouth at bedtime. 11/05/18   Janora Norlander, DO  DULoxetine (CYMBALTA) 60 MG capsule Take 1 capsule (60 mg total) by mouth daily. 11/05/18   Janora Norlander, DO    Family History      Family History  Problem Relation Age of Onset  . Arthritis Sister   . Hyperlipidemia Sister   . Hypertension Sister   . Cancer Brother   . Diabetes Brother   . Heart disease Brother     Social History Social History       Tobacco Use  . Smoking status: Never Smoker  . Smokeless tobacco: Never Used  Substance Use Topics  . Alcohol use: No  . Drug use: No     Allergies              Sulfa antibiotics, Codeine, Doxycycline, Erythromycin, Ibuprofen, Levaquin [levofloxacin], and Penicillins   Review of systems.  In general she is not complaining of fever or chills.  Skin does not complain of rashes or itching.  Head ears eyes nose mouth and throat does not complain of visual changes sore throat no nasal  discharge.  Respiratory is not really complaining of shortness of breath or increased cough.  Cardiac is not really complaining of chest pain feels that time she has some heartburn symptoms.  Has some mild edema.  GI feels that time she has some heartburn symptoms I do note she is on Prilosec.  GU is not complaining of dysuria has been recently treated for UTI.  Musculoskeletal positive for weakness she is she is not really complaining of pain at this time again it was thought possibly her chest pain was more chest wall.  Neurologic is not complaining of dizziness headache numbness.  Psych positive for anxiety states she would like to go home.  Physical exam.  Temperature is 97.7 pulse of 98 respirations 20 blood pressure 140/87 oxygen saturation have been in the 90s on room air.  In general this is a pleasant elderly female she appears to be somewhat anxious.  But in no distress  Her skin is warm and dry  Eyes visual acuity appears to be intact sclera and conjunctive are clear.  Oropharynx is clear mucous membranes moist.  Chest is clear to auscultation with slightly shallow air entry cannot really appreciate any labored breathing or overt congestion.  Heart is regular rate and rhythm borderline tachycardic she has mild lower extremity edema.  Abdomen is somewhat obese soft nontender with positive bowel sounds.  Musculoskeletal somewhat limited exam secondary to being in bed but appears able to move all extremities x4 at baseline.  Neurologic is grossly intact cranial nerves and track her speech is clear.  Psych she is grossly alert and oriented somewhat anxious but pleasant and appropriate-states she would really like to go home.  Labs.  July 11, 2019.  Troponin 6   Sodium 136 potassium 3.6 BUN 24 creatinine 1.39-CO2 level 25-liver function tests within normal limits   WBC 9.4 hemoglobin 12.6 platelets 248  Recent Labs    06/07/19 1718  06/24/19 0405   06/26/19 0828 06/27/19 0513 06/28/19 0622 06/29/19 1636  NA  --    < > 139   < > 140  --  142 141  K  --    < > 3.8   < >  3.6  --  3.7 3.7  CL  --    < > 106   < > 106  --  96* 96*  CO2  --    < > 21*   < > 25  --  33* 32  GLUCOSE  --    < > 125*   < > 129*  --  141* 128*  BUN  --    < > 40*   < > 30*  --  18 18  CREATININE  --    < > 2.12*   < > 1.26* 1.11* 1.22* 1.33*  CALCIUM  --    < > 7.6*   < > 8.4*  --  9.2 9.2  MG 1.4*  --  1.6*  --   --   --  1.4*  --   PHOS 3.1  --   --   --   --   --   --   --    < > = values in this interval not displayed.     Liver Function Tests: Recent Labs (within last 365 days)       Recent Labs    06/07/19 1530 06/21/19 1857 06/29/19 1636  AST 22 28 41  ALT 71* 22 48*  ALKPHOS 72 68 85  BILITOT 0.7 0.9 0.6  PROT 6.0* 6.2* 6.7  ALBUMIN 3.2* 3.1* 3.3*     Recent Labs (within last 365 days)       Recent Labs    11/14/18 2016 05/04/19 1653 05/30/19 2305  LIPASE 36 33 31     Recent Labs (within last 365 days)  No results for input(s): AMMONIA in the last 8760 hours.   CBC: Recent Labs (within last 365 days)           Recent Labs    06/21/19 1857  06/23/19 0441  06/27/19 0513 06/28/19 0622 06/29/19 1636  WBC 13.7*   < > 10.2   < > 8.1 7.9 10.2  NEUTROABS 12.6*  --  7.9*  --   --   --  7.2  HGB 10.8*   < > 8.8*   < > 9.8* 10.7* 11.9*  HCT 35.2*   < > 28.8*   < > 31.2* 34.7* 38.2  MCV 97.8   < > 98.3   < > 96.3 95.9 96.0  PLT 302   < > 221   < > 221 240 270   < > = values in this interval not displayed.     Lipid Recent Labs (within last 365 days)     Recent Labs    06/21/19 1858  TRIG 119       Assessment and plan.  1.  Chest pain-thought possibly this may be a combination of chest wall discomfort with anxiety contributing to it-at this point appears to be improved she is not complaining of any acute sounding chest discomfort but feels she is having some heartburn-.  She is on Prilosec as well as  Pepcid--unsure why she is on 2 agents but apparently she has been on this long-term and wouldwarrant follow-up to see if she really needs both of these--lthough she appears to have some significant symptoms  Vital signs are stable and relatively baseline at this point continue to monitor.--As noted above ER work-up did not show any acute cardiac event  2.  Anxiety she does have orders at this time for Xanax 0.5 mg 3 times daily as needed-it appears this is somewhat  of a long-term situation but will have to be monitored and we may have to readdress her anxiety when the as needed Xanax dosing  Ends  #3 atrial fibrillation with rapid ventricular rate this appears relatively well controlled on diltiazem 240 mg a day she is prophylactically treated with Eliquis 5 mg twice daily-at times has borderline tachycardia this will have to be watched I suspect with her anxiety her heart rate tends to be a bit more elevated at times but this will need continued monitoring  #4-history of acute on chronic diastolic CHF-she is on Lasix 20 mg a day chest x-ray in the ER showed no acute changes but this will have to be watched--- she is not complaining of really any increased shortness of breath at this time  #5 history of acute   On chronic kidney injury creatinine appears to be relatively stabilized at 1.39-at this point continue to monitor periodically.  During previous hospitalization it had been significantly elevated with a creatinine over 2  6.  Hypertension-she is on diltiazem 240 mg a day and Lasix 20 mg a day in addition to hydralazine 50 mg 3 times daily-Blood pressure tonight is 140/87-it was 151/91 in the ER-but earlier in her stay had been as low as 103/56 I suspect anxiety is contributing somewhat to this but this will have to be watched and if consistently elevated consider medication changes--of note her lisinopril was discontinued in the hospital because of renal concerns  [id number]-of note greater than  35 minutes spent assessing patient-discussing her status with nursing staff-reviewing her chart and labs including recent ER work-up --and coordinating and formulating a plan of care-of note greater than 50% of time spent coordinating a plan of care with input as noted above

## 2019-07-11 NOTE — ED Notes (Signed)
Give pt.sips of water

## 2019-07-11 NOTE — ED Triage Notes (Signed)
Pt brought in from the [facility] center for complaint of feeling like she has indigestion. Described to EMS felt like elephant sitting on chest.

## 2019-07-11 NOTE — ED Notes (Addendum)
DNR bracelet placed on pt.

## 2019-07-12 ENCOUNTER — Encounter: Payer: Self-pay | Admitting: Internal Medicine

## 2019-07-12 ENCOUNTER — Other Ambulatory Visit: Payer: Self-pay | Admitting: *Deleted

## 2019-07-12 NOTE — Patient Outreach (Signed)
Member assessed for potential [facility] Care Management needs as a benefit of  [place] [employer].  Member is currently receiving skilled therapy at [facility].  Member discussed in weekly telephonic IDT meeting with facility staff, [facility] UM team.  Facility confirms Tamara Stein is back at [facility]. Member went to ED yesterday for chest pain. Goal with therapy is for member to be able to do squat pivot transfers. Member was wheelchair bound prior to admission. Goal is for Tamara Stein to be at Assisted Living level of care for disposition planning purposes. Tentative dc plan is for ALF.   Will continue to follow for potential [facility] Care Management needs, progression, and for disposition plans.    Tamara Rolling, MSN-Ed, RN,BSN [place] Acute Care Coordinator [phone] [facility]) [phone]  (Toll free office)

## 2019-07-14 ENCOUNTER — Encounter: Payer: Self-pay | Admitting: Adult Health

## 2019-07-14 ENCOUNTER — Non-Acute Institutional Stay (SKILLED_NURSING_FACILITY): Payer: Medicare Other | Admitting: Adult Health

## 2019-07-14 DIAGNOSIS — F339 Major depressive disorder, recurrent, unspecified: Secondary | ICD-10-CM

## 2019-07-14 NOTE — Progress Notes (Signed)
Location:    [facility] Room Number: 123/P Place of Service:  SNF (31)   CODE STATUS: DNR  Allergies  Allergen Reactions  . Sulfa Antibiotics Anaphylaxis    Facial swelling   . Codeine Other (See Comments)    BLACK OUTS  . Doxycycline Nausea Only  . Erythromycin Swelling  . Ibuprofen Other (See Comments)    Stomach pain, muscle spasm, GI bleeding  . Levaquin [Levofloxacin] Other (See Comments)    Insomnia/trouble breathing  . Penicillins Diarrhea    Has patient had a PCN reaction causing immediate rash, facial/tongue/throat swelling, SOB or lightheadedness with hypotension: No Has patient had a PCN reaction causing severe rash involving mucus membranes or skin necrosis: No Has patient had a PCN reaction that required hospitalization Unknown Has patient had a PCN reaction occurring within the last 10 years: No If all of the above answers are "NO", then may proceed with Cephalosporin use.     Chief Complaint  Patient presents with  . Acute Visit    Depression     HPI:  Staff reports that she is having worsening depression. She has states that she feel abandoned; would be better off dead. There are no reports of insomnia present. Staff feels as though that she may benefit from increasing her depression medications.   Past Medical History:  Diagnosis Date  . Allergy   . Anxiety   . Cataract   . 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 Chronic kidney disease   . Chronic knee pain   . Depression   . GERD (gastroesophageal reflux disease)   . Hyperlipidemia   . Hypertension   . Thyroid disease     Past Surgical History:  Procedure Laterality Date  . ABDOMINAL HYSTERECTOMY    . CHOLECYSTECTOMY    . FLEXIBLE SIGMOIDOSCOPY N/A 06/05/2019   Procedure: FLEXIBLE SIGMOIDOSCOPY;  Surgeon: Rogene Houston, MD;  Location: [facility];  Service: Endoscopy;  Laterality: N/A;  . MOUTH SURGERY    . RIGHT ELBOW      Social History   Socioeconomic History   . Marital status: Widowed    Spouse name: Not on file  . Number of children: Not on file  . Years of education: Not on file  . Highest education level: Not on file  Occupational History  . Not on file  Social Needs  . Financial resource strain: Not hard at all  . Food insecurity    Worry: Never true    Inability: Never true  . Transportation needs    Medical: Not on file    Non-medical: Not on file  Tobacco Use  . Smoking status: Never Smoker  . Smokeless tobacco: Never Used  Substance and Sexual Activity  . Alcohol use: No  . Drug use: No  . Sexual activity: Never  Lifestyle  . Physical activity    Days per week: Not on file    Minutes per session: Not on file  . Stress: Not on file  Relationships  . Social Herbalist on phone: Not on file    Gets together: Not on file    Attends religious service: Not on file    Active member of club or organization: Not on file    Attends meetings of clubs or organizations: Not on file    Relationship status: Not on file  . Intimate partner violence    Fear of current or ex partner: Not on file  Emotionally abused: Not on file    Physically abused: Not on file    Forced sexual activity: Not on file  Other Topics Concern  . Not on file  Social History Narrative  . Not on file   Family History  Problem Relation Age of Onset  . Arthritis Sister   . Hyperlipidemia Sister   . Hypertension Sister   . Cancer Brother   . Diabetes Brother   . Heart disease Brother       VITAL SIGNS BP 112/64   Pulse 79   Temp 98 F (36.7 C) (Oral)   Resp 19   Ht 5' 2"  (1.575 m)   Wt 170 lb 12.8 oz (77.5 kg)   BMI 31.24 kg/m   Outpatient Encounter Medications as of 07/14/2019  Medication Sig  . acetaminophen (TYLENOL) 325 MG tablet Take 2 tablets (650 mg total) by mouth every 6 (six) hours as needed for mild pain, fever or headache (or Fever >/= 101).  . Alum & Mag Hydroxide-Simeth (MYLANTA PO) Take by mouth. Suspension;  200-200-20 mg/5 mL; amt: 30 cc; oral  Special Instructions: for heartburn/indigestion Twice A Day - PRN PRN 1, PRN 2  . apixaban (ELIQUIS) 5 MG TABS tablet Take 1 tablet (5 mg total) by mouth 2 (two) times daily.  Marland Kitchen aspirin EC 81 MG tablet Take 1 tablet (81 mg total) by mouth daily with breakfast.  . atorvastatin (LIPITOR) 40 MG tablet Take 1 tablet (40 mg total) by mouth at bedtime. Do not restart until instructed by MD  . Calcium Carbonate-Vitamin D (CALTRATE 600+D PO) Take 1 tablet by mouth daily.  . cetirizine (ZYRTEC) 10 MG chewable tablet Chew 10 mg by mouth daily.  . diclofenac Sodium (VOLTAREN) 1 % GEL Apply 2g topically to painful joints for arthritis to upper extremities. Max dose is 32g per day Four Times A Day 09:00 AM, 01:00 PM, 05:00 PM, 09:00 PM  . diclofenac Sodium (VOLTAREN) 1 % GEL Apply 4g topically to painful joints for arthritis to lower extremities. Max dose is 32g per day Four Times A Day 09:00 AM, 01:00 PM, 05:00 PM, 09:00 PM  . diltiazem (CARDIZEM CD) 240 MG 24 hr capsule Take 1 capsule (240 mg total) by mouth daily.  . DULoxetine (CYMBALTA) 30 MG capsule Take 30 mg by mouth daily. Along with 60 mg to = 90 mg  . DULoxetine (CYMBALTA) 60 MG capsule Take 1 capsule (60 mg total) by mouth daily.  . famotidine (PEPCID AC) 10 MG tablet Take 1 tablet (10 mg total) by mouth daily.  . furosemide (LASIX) 20 MG tablet Take 1 tablet (20 mg total) by mouth daily. For swelling/Fluid  . hydrALAZINE (APRESOLINE) 50 MG tablet Take 1 tablet (50 mg total) by mouth 3 (three) times daily. For BP  . isosorbide mononitrate (IMDUR) 30 MG 24 hr tablet Take 1 tablet (30 mg total) by mouth daily.  Marland Kitchen levothyroxine (SYNTHROID) 50 MCG tablet TAKE 1 TABLET ONCE DAILY  . meclizine (ANTIVERT) 25 MG tablet Take 25 mg by mouth 3 (three) times daily as needed for dizziness.  . Multiple Vitamin (MULTIVITAMIN WITH MINERALS) TABS tablet Take 1 tablet by mouth daily.  . NON FORMULARY Diet Change: Soft food  diet, continue thin liquids (moisten all meats with gravy)  . omeprazole (PRILOSEC) 40 MG capsule TAKE 1 CAPSULE DAILY  . ondansetron (ZOFRAN) 4 MG tablet Take 1 tablet (4 mg total) by mouth every 6 (six) hours as needed for nausea.  . Probiotic  Product (RISA-BID PROBIOTIC) TABS Take 1 tablet by mouth daily.  . raloxifene (EVISTA) 60 MG tablet Take 1 tablet (60 mg total) by mouth daily.  . [DISCONTINUED] ALPRAZolam (XANAX) 0.5 MG tablet Take 1 tablet (0.5 mg total) by mouth 3 (three) times daily as needed for up to 11 days for sleep or anxiety.  . [DISCONTINUED] atorvastatin (LIPITOR) 40 MG tablet Take 1 tablet (40 mg total) by mouth at bedtime.  . [DISCONTINUED] diclofenac sodium (VOLTAREN) 1 % GEL Apply 4 g topically 4 (four) times daily. (for arthritis to REPLACE oral diclofenac) (Patient taking differently: Apply 4 g topically 4 (four) times daily. (for arthritis to REPLACE oral diclofenac) for upper and lower extremities)  . [DISCONTINUED] DULoxetine (CYMBALTA) 60 MG capsule Take 1 capsule (60 mg total) by mouth daily.   No facility-administered encounter medications on file as of 07/14/2019.      SIGNIFICANT DIAGNOSTIC EXAMS  PREVIOUS;   06-08-19: 2-d echo:  1. Left ventricular ejection fraction, by visual estimation, is 65 to 70%. The left ventricle has hyperdynamic function. There is borderline left ventricular hypertrophy.  2. Elevated left ventricular end-diastolic pressure.  3. Left ventricular diastolic parameters are consistent with Grade I diastolic dysfunction (impaired relaxation).  4. The left ventricle has no regional wall motion abnormalities.  5. Global right ventricle has normal systolic function.The right ventricular size is normal. No increase in right ventricular wall thickness.  6. Left atrial size was normal.  7. Right atrial size was normal.  8. Mild to moderate aortic valve annular calcification.  9. Mild to moderate mitral annular calcification. 10. The mitral  valve is degenerative. Trace mitral valve regurgitation. 11. The tricuspid valve is grossly normal. Tricuspid valve regurgitation is mild. 12. The aortic valve is tricuspid. Aortic valve regurgitation is not visualized. Mild aortic valve stenosis. 13. There is Mild calcification of the aortic valve. 14. There is Mild thickening of the aortic valve. 15. The pulmonic valve was not well visualized. Pulmonic valve regurgitation is not visualized. 16. Moderately elevated pulmonary artery systolic pressure. 17. The inferior vena cava is normal in size with greater than 50% respiratory variability, suggesting right atrial pressure of 3 mmHg. 18. The interatrial septum was not well visualized.    06-24-19: renal ultrasound: No significant renal abnormality seen. 5 cm cyst seen in left lower quadrant which most likely corresponds to adnexal cyst seen on prior CT scan.  06-19-19: ct of abdomen and pelvis:  1. Resolved large bowel inflammation since October with no acute or inflammatory process identified in the noncontrast abdomen or pelvis. 2. New small layering pleural effusions. Chronic large hiatal hernia. Associated lung base atelectasis. 3. Chronic and benign findings of the spleen and left ovary. 4. Aortic Atherosclerosis   06-29-19: ct of head; Atrophy, chronic microvascular disease. No acute intracranial abnormality.  07-11-19: chest x-ray: Mild pulmonary vascular congestion and trace pleural effusions. Similar cardiomegaly.  NO NEW EXAMS.    LABS REVIEWED PREVIOUS;   06-21-19: wbc 13.7; hgb 10.8; hct 35.2; mcv 97.8; plt 302; glucose 140; bun 28; creat 1.56; k+ 3.1; na++ 138; ca 8.3; liver normal albumin 3.1 urine culture: enterobacter cloacae: blood culture: no growth 06-22-19: tsh 4.733 06-29-19: wbc 10.2; hgb 11.9; hct 38.2; mcv 96.0 plt 270; glucose 128; bun 18; creat 1.33; k+ 3.7; na++ 141; ca 9,2; liver normal albumin 3.1; urine culture: 40,000 enterobacter aerogenes 07-11-19: wbc  9.4; hgb 12.6; hct 40.1; mcv 95.2 plt 248; glucose 128; bun 24; creat 1.39; k+ 3.6; na++ 136;  ca 9.1; liver normal albumin 3.7   NO NEW LABS.    Review of Systems  Constitutional: Negative for malaise/fatigue.  Respiratory: Negative for cough and shortness of breath.   Cardiovascular: Negative for chest pain, palpitations and leg swelling.  Gastrointestinal: Negative for abdominal pain, constipation and heartburn.  Musculoskeletal: Negative for back pain, joint pain and myalgias.  Skin: Negative.   Neurological: Negative for dizziness.  Psychiatric/Behavioral: Positive for depression. The patient is not nervous/anxious.       Physical Exam Constitutional:      General: She is not in acute distress.    Appearance: She is well-developed. She is not diaphoretic.  Neck:     Thyroid: No thyromegaly.  Cardiovascular:     Rate and Rhythm: Normal rate and regular rhythm.     Pulses: Normal pulses.     Heart sounds: Normal heart sounds.  Pulmonary:     Effort: Pulmonary effort is normal. No respiratory distress.     Breath sounds: Normal breath sounds.  Abdominal:     General: Bowel sounds are normal. There is no distension.     Palpations: Abdomen is soft.     Tenderness: There is no abdominal tenderness.  Musculoskeletal:        General: Normal range of motion.     Cervical back: Neck supple.     Right lower leg: No edema.     Left lower leg: No edema.  Lymphadenopathy:     Cervical: No cervical adenopathy.  Skin:    General: Skin is warm and dry.  Neurological:     Mental Status: She is alert. Mental status is at baseline.  Psychiatric:        Mood and Affect: Mood normal.      ASSESSMENT/ PLAN:  TODAY;   1. Major depression recurrent chronic: is worse: will increase her cymbalta to 90 mg daily and will monitor      MD is aware of resident's narcotic use and is in agreement with current plan of care. We will attempt to wean resident as appropriate.  Ok Edwards NP [facility]  Contact [phone] Monday through Friday 8am- 5pm  After hours call [phone]

## 2019-07-15 DIAGNOSIS — F339 Major depressive disorder, recurrent, unspecified: Secondary | ICD-10-CM | POA: Insufficient documentation

## 2019-07-15 DIAGNOSIS — M810 Age-related osteoporosis without current pathological fracture: Secondary | ICD-10-CM | POA: Insufficient documentation

## 2019-07-15 DIAGNOSIS — N1832 Chronic kidney disease, stage 3b: Secondary | ICD-10-CM | POA: Insufficient documentation

## 2019-07-17 ENCOUNTER — Other Ambulatory Visit: Payer: Self-pay | Admitting: Internal Medicine

## 2019-07-17 ENCOUNTER — Other Ambulatory Visit (HOSPITAL_COMMUNITY)
Admission: RE | Admit: 2019-07-17 | Discharge: 2019-07-17 | Disposition: A | Discharge status: Home / Self Care | Payer: Medicare Other | Source: Ambulatory Visit | Attending: Internal Medicine | Admitting: Internal Medicine

## 2019-07-17 DIAGNOSIS — Z20828 Contact with and (suspected) exposure to other viral communicable diseases: Secondary | ICD-10-CM | POA: Diagnosis not present

## 2019-07-17 DIAGNOSIS — Z9189 Other specified personal risk factors, not elsewhere classified: Secondary | ICD-10-CM

## 2019-07-18 ENCOUNTER — Non-Acute Institutional Stay (SKILLED_NURSING_FACILITY): Payer: Medicare Other | Admitting: Adult Health

## 2019-07-18 ENCOUNTER — Encounter: Payer: Self-pay | Admitting: Adult Health

## 2019-07-18 DIAGNOSIS — I5033 Acute on chronic diastolic (congestive) heart failure: Secondary | ICD-10-CM

## 2019-07-18 DIAGNOSIS — N1832 Chronic kidney disease, stage 3b: Secondary | ICD-10-CM

## 2019-07-18 DIAGNOSIS — I4891 Unspecified atrial fibrillation: Secondary | ICD-10-CM

## 2019-07-18 DIAGNOSIS — I130 Hypertensive heart and chronic kidney disease with heart failure and stage 1 through stage 4 chronic kidney disease, or unspecified chronic kidney disease: Secondary | ICD-10-CM | POA: Diagnosis not present

## 2019-07-18 DIAGNOSIS — I5032 Chronic diastolic (congestive) heart failure: Secondary | ICD-10-CM

## 2019-07-18 NOTE — Progress Notes (Signed)
Location:    [facility] Room Number: 123/P Place of Service:  SNF (31)   CODE STATUS: DNR  Allergies  Allergen Reactions  . Sulfa Antibiotics Anaphylaxis    Facial swelling   . Codeine Other (See Comments)    BLACK OUTS  . Doxycycline Nausea Only  . Erythromycin Swelling  . Ibuprofen Other (See Comments)    Stomach pain, muscle spasm, GI bleeding  . Levaquin [Levofloxacin] Other (See Comments)    Insomnia/trouble breathing  . Penicillins Diarrhea    Has patient had a PCN reaction causing immediate rash, facial/tongue/throat swelling, SOB or lightheadedness with hypotension: No Has patient had a PCN reaction causing severe rash involving mucus membranes or skin necrosis: No Has patient had a PCN reaction that required hospitalization Unknown Has patient had a PCN reaction occurring within the last 10 years: No If all of the above answers are "NO", then may proceed with Cephalosporin use.    Chief Complaint  Patient presents with  . 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      HPI:  She is a 78 year old long term resident of this facility being seen for the management of her chronic illnesses: afib; chf; hypertensive heart disease.  No reports of uncontrolled pain; no reports of constipation; no worsening lower extremity edema.   Past Medical History:  Diagnosis Date  . Allergy   . Anxiety   . Cataract   . 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 Chronic kidney disease   . Chronic knee pain   . Depression   . GERD (gastroesophageal reflux disease)   . Hyperlipidemia   . Hypertension   . Thyroid disease     Past Surgical History:  Procedure Laterality Date  . ABDOMINAL  HYSTERECTOMY    . CHOLECYSTECTOMY    . FLEXIBLE SIGMOIDOSCOPY N/A 06/05/2019   Procedure: FLEXIBLE SIGMOIDOSCOPY;  Surgeon: Rogene Houston, MD;  Location: [facility];  Service: Endoscopy;  Laterality: N/A;  . MOUTH SURGERY    . RIGHT ELBOW      Social History   Socioeconomic History  . Marital status: Widowed    Spouse name: Not on file  . Number of children: Not on file  . Years of education: Not on file  . Highest education level: Not on file  Occupational History  . Not on file  Social Needs  . Financial resource strain: Not hard at all  . Food insecurity    Worry: Never true    Inability: Never true  . Transportation needs    Medical: Not on file    Non-medical: Not on file  Tobacco Use  . Smoking status: Never Smoker  . Smokeless tobacco: Never Used  Substance and Sexual Activity  . Alcohol use: No  . Drug use: No  . Sexual activity: Never  Lifestyle  . Physical activity    Days per week: Not on file    Minutes per session: Not on file  . Stress: Not on file  Relationships  . Social Herbalist on phone: Not on file    Gets together: Not on file    Attends religious service: Not on file  Active member of club or organization: Not on file    Attends meetings of clubs or organizations: Not on file    Relationship status: Not on file  . Intimate partner violence    Fear of current or ex partner: Not on file    Emotionally abused: Not on file    Physically abused: Not on file    Forced sexual activity: Not on file  Other Topics Concern  . Not on file  Social History Narrative  . Not on file   Family History  Problem Relation Age of Onset  . Arthritis Sister   . Hyperlipidemia Sister   . Hypertension Sister   . Cancer Brother   . Diabetes Brother   . Heart disease Brother       VITAL SIGNS BP 140/70   Pulse 84   Temp 98.4 F (36.9 C) (Oral)   Resp 20   Ht 5' 2"  (1.575 m)   Wt 170 lb 12.8 oz (77.5 kg)   SpO2 95%   BMI 31.24  kg/m   Outpatient Encounter Medications as of 07/18/2019  Medication Sig  . acetaminophen (TYLENOL) 325 MG tablet Take 2 tablets (650 mg total) by mouth every 6 (six) hours as needed for mild pain, fever or headache (or Fever >/= 101).  . Alum & Mag Hydroxide-Simeth (MYLANTA PO) Take by mouth. Suspension; 200-200-20 mg/5 mL; amt: 30 cc; oral  Special Instructions: for heartburn/indigestion Twice A Day - PRN PRN 1, PRN 2  . apixaban (ELIQUIS) 5 MG TABS tablet Take 1 tablet (5 mg total) by mouth 2 (two) times daily.  Marland Kitchen aspirin EC 81 MG tablet Take 1 tablet (81 mg total) by mouth daily with breakfast.  . atorvastatin (LIPITOR) 40 MG tablet Take 1 tablet (40 mg total) by mouth at bedtime. Do not restart until instructed by MD  . Calcium Carbonate-Vitamin D (CALTRATE 600+D PO) Take 1 tablet by mouth daily.  . cetirizine (ZYRTEC) 10 MG chewable tablet Chew 10 mg by mouth daily.  . diclofenac Sodium (VOLTAREN) 1 % GEL Apply 2g topically to painful joints for arthritis to upper extremities. Max dose is 32g per day Four Times A Day 09:00 AM, 01:00 PM, 05:00 PM, 09:00 PM  . diclofenac Sodium (VOLTAREN) 1 % GEL Apply 4g topically to painful joints for arthritis to lower extremities. Max dose is 32g per day Four Times A Day 09:00 AM, 01:00 PM, 05:00 PM, 09:00 PM  . diltiazem (CARDIZEM CD) 240 MG 24 hr capsule Take 1 capsule (240 mg total) by mouth daily.  . DULoxetine (CYMBALTA) 30 MG capsule Take 30 mg by mouth daily. Along with 60 mg to = 90 mg  . DULoxetine (CYMBALTA) 60 MG capsule Take 1 capsule (60 mg total) by mouth daily.  . famotidine (PEPCID AC) 10 MG tablet Take 1 tablet (10 mg total) by mouth daily.  . furosemide (LASIX) 20 MG tablet Take 1 tablet (20 mg total) by mouth daily. For swelling/Fluid  . hydrALAZINE (APRESOLINE) 50 MG tablet Take 1 tablet (50 mg total) by mouth 3 (three) times daily. For BP  . isosorbide mononitrate (IMDUR) 30 MG 24 hr tablet Take 1 tablet (30 mg total) by mouth  daily.  Marland Kitchen levothyroxine (SYNTHROID) 50 MCG tablet TAKE 1 TABLET ONCE DAILY  . meclizine (ANTIVERT) 25 MG tablet Take 25 mg by mouth 3 (three) times daily as needed for dizziness.  . Multiple Vitamin (MULTIVITAMIN WITH MINERALS) TABS tablet Take 1 tablet by mouth daily.  Marland Kitchen  NON FORMULARY Diet Change: Dysphagia 3 (chopped), continue thin liquids (moisten meats with gravy)  . omeprazole (PRILOSEC) 40 MG capsule TAKE 1 CAPSULE DAILY  . ondansetron (ZOFRAN) 4 MG tablet Take 1 tablet (4 mg total) by mouth every 6 (six) hours as needed for nausea.  . Probiotic Product (RISA-BID PROBIOTIC) TABS Take 1 tablet by mouth daily.  . raloxifene (EVISTA) 60 MG tablet Take 1 tablet (60 mg total) by mouth daily.  . [DISCONTINUED] atorvastatin (LIPITOR) 40 MG tablet Take 1 tablet (40 mg total) by mouth at bedtime.  . [DISCONTINUED] DULoxetine (CYMBALTA) 60 MG capsule Take 1 capsule (60 mg total) by mouth daily.   No facility-administered encounter medications on file as of 07/18/2019.      SIGNIFICANT DIAGNOSTIC EXAMS   PREVIOUS;   06-08-19: 2-d echo:  1. Left ventricular ejection fraction, by visual estimation, is 65 to 70%. The left ventricle has hyperdynamic function. There is borderline left ventricular hypertrophy.  2. Elevated left ventricular end-diastolic pressure.  3. Left ventricular diastolic parameters are consistent with Grade I diastolic dysfunction (impaired relaxation).  4. The left ventricle has no regional wall motion abnormalities.  5. Global right ventricle has normal systolic function.The right ventricular size is normal. No increase in right ventricular wall thickness.  6. Left atrial size was normal.  7. Right atrial size was normal.  8. Mild to moderate aortic valve annular calcification.  9. Mild to moderate mitral annular calcification. 10. The mitral valve is degenerative. Trace mitral valve regurgitation. 11. The tricuspid valve is grossly normal. Tricuspid valve regurgitation  is mild. 12. The aortic valve is tricuspid. Aortic valve regurgitation is not visualized. Mild aortic valve stenosis. 13. There is Mild calcification of the aortic valve. 14. There is Mild thickening of the aortic valve. 15. The pulmonic valve was not well visualized. Pulmonic valve regurgitation is not visualized. 16. Moderately elevated pulmonary artery systolic pressure. 17. The inferior vena cava is normal in size with greater than 50% respiratory variability, suggesting right atrial pressure of 3 mmHg. 18. The interatrial septum was not well visualized.    06-24-19: renal ultrasound: No significant renal abnormality seen. 5 cm cyst seen in left lower quadrant which most likely corresponds to adnexal cyst seen on prior CT scan.  06-19-19: ct of abdomen and pelvis:  1. Resolved large bowel inflammation since October with no acute or inflammatory process identified in the noncontrast abdomen or pelvis. 2. New small layering pleural effusions. Chronic large hiatal hernia. Associated lung base atelectasis. 3. Chronic and benign findings of the spleen and left ovary. 4. Aortic Atherosclerosis   06-29-19: ct of head; Atrophy, chronic microvascular disease. No acute intracranial abnormality.  07-11-19: chest x-ray: Mild pulmonary vascular congestion and trace pleural effusions. Similar cardiomegaly.  NO NEW EXAMS.    LABS REVIEWED PREVIOUS;   06-21-19: wbc 13.7; hgb 10.8; hct 35.2; mcv 97.8; plt 302; glucose 140; bun 28; creat 1.56; k+ 3.1; na++ 138; ca 8.3; liver normal albumin 3.1 urine culture: enterobacter cloacae: blood culture: no growth 06-22-19: tsh 4.733 06-29-19: wbc 10.2; hgb 11.9; hct 38.2; mcv 96.0 plt 270; glucose 128; bun 18; creat 1.33; k+ 3.7; na++ 141; ca 9,2; liver normal albumin 3.1; urine culture: 40,000 enterobacter aerogenes 07-11-19: wbc 9.4; hgb 12.6; hct 40.1; mcv 95.2 plt 248; glucose 128; bun 24; creat 1.39; k+ 3.6; na++ 136; ca 9.1; liver normal albumin 3.7    NO NEW LABS.    Review of Systems  Constitutional: Negative for malaise/fatigue.  Respiratory:  Negative for cough and shortness of breath.   Cardiovascular: Negative for chest pain, palpitations and leg swelling.  Gastrointestinal: Negative for abdominal pain, constipation and heartburn.  Musculoskeletal: Negative for back pain, joint pain and myalgias.  Skin: Negative.   Neurological: Negative for dizziness.  Psychiatric/Behavioral: Positive for depression. The patient is not nervous/anxious.      Physical Exam Constitutional:      General: She is not in acute distress.    Appearance: She is well-developed. She is not diaphoretic.  Neck:     Thyroid: No thyromegaly.  Cardiovascular:     Rate and Rhythm: Normal rate and regular rhythm.     Pulses: Normal pulses.     Heart sounds: Normal heart sounds.  Pulmonary:     Effort: Pulmonary effort is normal. No respiratory distress.     Breath sounds: Normal breath sounds.  Abdominal:     General: Bowel sounds are normal. There is no distension.     Palpations: Abdomen is soft.     Tenderness: There is no abdominal tenderness.  Musculoskeletal:        General: Normal range of motion.     Cervical back: Neck supple.     Right lower leg: No edema.     Left lower leg: No edema.  Lymphadenopathy:     Cervical: No cervical adenopathy.  Skin:    General: Skin is warm and dry.  Neurological:     Mental Status: She is alert. Mental status is at baseline.  Psychiatric:        Mood and Affect: Mood normal.     ASSESSMENT/ PLAN:   TODAY;   1. Atrial fibrillation with RVR: is stable will continue asa 91 mg daily eliquis 5 mg twice daily and cardizem cd 240 mg daily for rate control  2. acuteon chronic diastolic congestive heart failure: is stable EF 65-70% (06-08-19) will continue lasix 20 gm daily hydralazine 50 mg three times daily imdur 30 mg daily   3. Hypertensive heart and chronic kidney disease with chronic diastolic  congestive heart failure with stage 3 b chronic kidney disease is stable b/p 140/70 will continue apresoline 50 mg three times daily    PREVIOUS   4. Gastroesophageal reflux disease without esophagitis: is stable will continue prilosec 40 mg daily and pepcid 10 mg daily  5. Hypothyroidism  Unspecified type: is stable tsh 4.733 will continue synthroid 50 mcg daily  6. 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 Stage 3b chronic kidney disease: is stable bun 24 creat 1.39  8. Arthritis: pain is presently managed: will continue voltaren gel 4 gm applied four times daily   9.  Menopausal osteoporosis: is stable will continue evista 60 mg daily   10. Major depression recurrent chronic: is stable will continue cymbalta 90 mg daily;   11. Chronic non-seasonal allergic rhinitis: is stable will continue zyrtec 10 mg daily   13. Dyslipidemia: is stable will continue lipitor 40 mg daily      MD is aware of resident's narcotic use and is in agreement with current plan of care. We will attempt to wean resident as appropriate.  Ok Edwards NP [facility]  Contact [phone] Monday through Friday 8am- 5pm  After hours call [phone]

## 2019-07-19 ENCOUNTER — Other Ambulatory Visit: Payer: Self-pay | Admitting: *Deleted

## 2019-07-19 ENCOUNTER — Encounter (HOSPITAL_COMMUNITY)
Admission: RE | Admit: 2019-07-19 | Discharge: 2019-07-19 | Disposition: A | Discharge status: Home / Self Care | Payer: Medicare Other | Source: Skilled Nursing Facility | Attending: Adult Health | Admitting: Adult Health

## 2019-07-19 ENCOUNTER — Encounter: Payer: Self-pay | Admitting: Adult Health

## 2019-07-19 DIAGNOSIS — R4182 Altered mental status, unspecified: Secondary | ICD-10-CM | POA: Diagnosis not present

## 2019-07-19 DIAGNOSIS — R509 Fever, unspecified: Secondary | ICD-10-CM | POA: Insufficient documentation

## 2019-07-19 DIAGNOSIS — N179 Acute kidney failure, unspecified: Secondary | ICD-10-CM | POA: Diagnosis not present

## 2019-07-19 LAB — RESPIRATORY PANEL BY RT PCR (FLU A&B, COVID)
Influenza A by PCR: NEGATIVE
Influenza B by PCR: NEGATIVE
SARS Coronavirus 2 by RT PCR: NEGATIVE

## 2019-07-19 NOTE — Patient Outreach (Addendum)
Screened for potential [facility] Care Management needs as a benefit of  [employer].  Mrs. Tamara Stein is currently receiving skilled therapy at [facility].   Progression and transition plan discussed in weekly telephonic interdisciplinary team meeting with facility staff.  Facility reports recommendation is for long term care. However disposition plan is pending. Assisted Living Facility vs long term care.  Will continue to follow for transition plan. Will update [facility] Care Management team at [place].   Tamara Rolling, MSN-Ed, RN,BSN [place] Acute Care Coordinator [phone] [facility]) [phone]  (Toll free office)

## 2019-07-19 NOTE — Progress Notes (Signed)
Location:  [facility] Room Number: 123-P Place of Service:  SNF (31)   CODE STATUS: DNR  Allergies  Allergen Reactions   Sulfa Antibiotics Anaphylaxis    Facial swelling    Codeine Other (See Comments)    BLACK OUTS   Doxycycline Nausea Only   Erythromycin Swelling   Ibuprofen Other (See Comments)    Stomach pain, muscle spasm, GI bleeding   Levaquin [Levofloxacin] Other (See Comments)    Insomnia/trouble breathing   Penicillins Diarrhea    Has patient had a PCN reaction causing immediate rash, facial/tongue/throat swelling, SOB or lightheadedness with hypotension: No Has patient had a PCN reaction causing severe rash involving mucus membranes or skin necrosis: No Has patient had a PCN reaction that required hospitalization Unknown Has patient had a PCN reaction occurring within the last 10 years: No If all of the above answers are "NO", then may proceed with Cephalosporin use.    Chief Complaint  Patient presents with   Acute Visit    care plan meeting    HPI:  We have come together for her care plan meeting. BIMS 15/15 mood 5/30. Her weight is has gone from 182 pounds to 170 pounds her po intake is poor. She states that she has a poor appetite with no to little urge to eat. No falls no uncontrolled pain; no constipation. She continues to be followed for her chronic illnesses including: afib; chf; cerebellar degeneration.   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[place])    Chronic kidney disease    Chronic knee pain    Depression    GERD (gastroesophageal reflux disease)    Hyperlipidemia    Hypertension    Thyroid disease     Past Surgical History:  Procedure Laterality Date   ABDOMINAL HYSTERECTOMY     CHOLECYSTECTOMY     FLEXIBLE SIGMOIDOSCOPY N/A 06/05/2019   Procedure: FLEXIBLE SIGMOIDOSCOPY;  Surgeon: Rogene Houston, MD;  [facility];   Service: Endoscopy;  Laterality: N/A;   MOUTH SURGERY     RIGHT ELBOW      Social History   Socioeconomic History   Marital status: Widowed    Spouse name: Not on file   Number of children: Not on file   Years of education: Not on file   Highest education level: Not on file  Occupational History   Not on file  Social Needs   Financial resource strain: Not hard at all   Food insecurity    Worry: Never true    Inability: Never true   Transportation needs    Medical: Not on file    Non-medical: Not on file  Tobacco Use   Smoking status: Never Smoker   Smokeless tobacco: Never Used  Substance and Sexual Activity   Alcohol use: No   Drug use: No   Sexual activity: Never  Lifestyle   Physical activity    Days per week: Not on file    Minutes per session: Not on file   Stress: Not on file  Relationships   Social connections    Talks on phone: Not on file    Gets together: Not on file    Attends religious service: Not on file    Active member of club or organization: Not on file    Attends meetings of clubs or organizations: Not on file    Relationship status:  Not on file   Intimate partner violence    Fear of current or ex partner: Not on file    Emotionally abused: Not on file    Physically abused: Not on file    Forced sexual activity: Not on file  Other Topics Concern   Not on file  Social History Narrative   Not on file   Family History  Problem Relation Age of Onset   Arthritis Sister    Hyperlipidemia Sister    Hypertension Sister    Cancer Brother    Diabetes Brother    Heart disease Brother       VITAL SIGNS BP 115/73    Pulse 84    Temp 97.7 F (36.5 C) (Oral)    Resp 17    Ht 5' 2"  (1.575 m)    Wt 170 lb 12.8 oz (77.5 kg)    SpO2 95%    BMI 31.24 kg/m   Outpatient Encounter Medications as of 07/20/2019  Medication Sig   acetaminophen (TYLENOL) 325 MG tablet Take 2 tablets (650 mg total) by mouth every 6 (six) hours  as needed for mild pain, fever or headache (or Fever >/= 101).   Alum & Mag Hydroxide-Simeth (MYLANTA PO) Take by mouth. Suspension; 200-200-20 mg/5 mL; amt: 30 cc; oral  Special Instructions: for heartburn/indigestion Twice A Day - PRN PRN 1, PRN 2   apixaban (ELIQUIS) 5 MG TABS tablet Take 1 tablet (5 mg total) by mouth 2 (two) times daily.   aspirin EC 81 MG tablet Take 1 tablet (81 mg total) by mouth daily with breakfast.   atorvastatin (LIPITOR) 40 MG tablet Take 1 tablet (40 mg total) by mouth at bedtime. Do not restart until instructed by MD   Calcium Carbonate-Vitamin D (CALTRATE 600+D PO) Take 1 tablet by mouth daily.   cetirizine (ZYRTEC) 10 MG chewable tablet Chew 10 mg by mouth daily.   diclofenac Sodium (VOLTAREN) 1 % GEL Apply 2g topically to painful joints for arthritis to upper extremities. Max dose is 32g per day Four Times A Day 09:00 AM, 01:00 PM, 05:00 PM, 09:00 PM   diclofenac Sodium (VOLTAREN) 1 % GEL Apply 4g topically to painful joints for arthritis to lower extremities. Max dose is 32g per day Four Times A Day 09:00 AM, 01:00 PM, 05:00 PM, 09:00 PM   diltiazem (CARDIZEM CD) 240 MG 24 hr capsule Take 1 capsule (240 mg total) by mouth daily.   DULoxetine (CYMBALTA) 30 MG capsule Take 30 mg by mouth daily. Along with 60 mg to = 90 mg   DULoxetine (CYMBALTA) 60 MG capsule Take 1 capsule (60 mg total) by mouth daily.   famotidine (PEPCID AC) 10 MG tablet Take 1 tablet (10 mg total) by mouth daily.   furosemide (LASIX) 20 MG tablet Take 1 tablet (20 mg total) by mouth daily. For swelling/Fluid   hydrALAZINE (APRESOLINE) 50 MG tablet Take 1 tablet (50 mg total) by mouth 3 (three) times daily. For BP   isosorbide mononitrate (IMDUR) 30 MG 24 hr tablet Take 1 tablet (30 mg total) by mouth daily.   levothyroxine (SYNTHROID) 50 MCG tablet TAKE 1 TABLET ONCE DAILY   meclizine (ANTIVERT) 25 MG tablet Take 25 mg by mouth 3 (three) times daily as needed for  dizziness.   Multiple Vitamin (MULTIVITAMIN WITH MINERALS) TABS tablet Take 1 tablet by mouth daily.   NON FORMULARY Diet Change: Dysphagia 3 (chopped), continue thin liquids (moisten meats with gravy)   omeprazole (PRILOSEC)  40 MG capsule TAKE 1 CAPSULE DAILY   ondansetron (ZOFRAN) 4 MG tablet Take 1 tablet (4 mg total) by mouth every 6 (six) hours as needed for nausea.   Probiotic Product (RISA-BID PROBIOTIC) TABS Take 1 tablet by mouth daily.   raloxifene (EVISTA) 60 MG tablet Take 1 tablet (60 mg total) by mouth daily.   [DISCONTINUED] atorvastatin (LIPITOR) 40 MG tablet Take 1 tablet (40 mg total) by mouth at bedtime.   [DISCONTINUED] DULoxetine (CYMBALTA) 60 MG capsule Take 1 capsule (60 mg total) by mouth daily.   No facility-administered encounter medications on file as of 07/20/2019.      SIGNIFICANT DIAGNOSTIC EXAMS   PREVIOUS;   06-08-19: 2-d echo:  1. Left ventricular ejection fraction, by visual estimation, is 65 to 70%. The left ventricle has hyperdynamic function. There is borderline left ventricular hypertrophy.  2. Elevated left ventricular end-diastolic pressure.  3. Left ventricular diastolic parameters are consistent with Grade I diastolic dysfunction (impaired relaxation).  4. The left ventricle has no regional wall motion abnormalities.  5. Global right ventricle has normal systolic function.The right ventricular size is normal. No increase in right ventricular wall thickness.  6. Left atrial size was normal.  7. Right atrial size was normal.  8. Mild to moderate aortic valve annular calcification.  9. Mild to moderate mitral annular calcification. 10. The mitral valve is degenerative. Trace mitral valve regurgitation. 11. The tricuspid valve is grossly normal. Tricuspid valve regurgitation is mild. 12. The aortic valve is tricuspid. Aortic valve regurgitation is not visualized. Mild aortic valve stenosis. 13. There is Mild calcification of the aortic  valve. 14. There is Mild thickening of the aortic valve. 15. The pulmonic valve was not well visualized. Pulmonic valve regurgitation is not visualized. 16. Moderately elevated pulmonary artery systolic pressure. 17. The inferior vena cava is normal in size with greater than 50% respiratory variability, suggesting right atrial pressure of 3 mmHg. 18. The interatrial septum was not well visualized.    06-24-19: renal ultrasound: No significant renal abnormality seen. 5 cm cyst seen in left lower quadrant which most likely corresponds to adnexal cyst seen on prior CT scan.  06-19-19: ct of abdomen and pelvis:  1. Resolved large bowel inflammation since October with no acute or inflammatory process identified in the noncontrast abdomen or pelvis. 2. New small layering pleural effusions. Chronic large hiatal hernia. Associated lung base atelectasis. 3. Chronic and benign findings of the spleen and left ovary. 4. Aortic Atherosclerosis   06-29-19: ct of head; Atrophy, chronic microvascular disease. No acute intracranial abnormality.  07-11-19: chest x-ray: Mild pulmonary vascular congestion and trace pleural effusions. Similar cardiomegaly.  NO NEW EXAMS.    LABS REVIEWED PREVIOUS;   06-21-19: wbc 13.7; hgb 10.8; hct 35.2; mcv 97.8; plt 302; glucose 140; bun 28; creat 1.56; k+ 3.1; na++ 138; ca 8.3; liver normal albumin 3.1 urine culture: enterobacter cloacae: blood culture: no growth 06-22-19: tsh 4.733 06-29-19: wbc 10.2; hgb 11.9; hct 38.2; mcv 96.0 plt 270; glucose 128; bun 18; creat 1.33; k+ 3.7; na++ 141; ca 9,2; liver normal albumin 3.1; urine culture: 40,000 enterobacter aerogenes 07-11-19: wbc 9.4; hgb 12.6; hct 40.1; mcv 95.2 plt 248; glucose 128; bun 24; creat 1.39; k+ 3.6; na++ 136; ca 9.1; liver normal albumin 3.7   NO NEW LABS.   Review of Systems  Constitutional: Negative for malaise/fatigue.  Respiratory: Negative for cough and shortness of breath.   Cardiovascular:  Negative for chest pain, palpitations and leg swelling.  Gastrointestinal: Negative for abdominal pain, constipation and heartburn.  Musculoskeletal: Negative for back pain, joint pain and myalgias.  Skin: Negative.   Neurological: Negative for dizziness.  Psychiatric/Behavioral: Positive for depression. The patient is not nervous/anxious.     Physical Exam Constitutional:      General: She is not in acute distress.    Appearance: She is well-developed. She is not diaphoretic.  Neck:     Thyroid: No thyromegaly.  Cardiovascular:     Rate and Rhythm: Normal rate and regular rhythm.     Pulses: Normal pulses.     Heart sounds: Normal heart sounds.  Pulmonary:     Effort: Pulmonary effort is normal. No respiratory distress.     Breath sounds: Normal breath sounds.  Abdominal:     General: Bowel sounds are normal. There is no distension.     Palpations: Abdomen is soft.     Tenderness: There is no abdominal tenderness.  Musculoskeletal:        General: Normal range of motion.     Cervical back: Neck supple.     Right lower leg: No edema.     Left lower leg: No edema.  Lymphadenopathy:     Cervical: No cervical adenopathy.  Skin:    General: Skin is warm and dry.  Neurological:     Mental Status: She is alert. Mental status is at baseline.  Psychiatric:        Mood and Affect: Mood normal.       ASSESSMENT/ PLAN:  TODAY  1. Atrial fibrillation with rvr 2. Chronic diastolic HF 3. Cerebellar degeneration Will continue current medications Will continue therapy as directed Will continue to monitor her status.       MD is aware of resident's narcotic use and is in agreement with current plan of care. We will attempt to wean resident as appropriate.  Ok Edwards NP [facility]  Contact [phone] Monday through Friday 8am- 5pm  After hours call [phone]

## 2019-07-19 NOTE — Progress Notes (Signed)
Location:    [facility] Room Number: 123/P Place of Service:  SNF (31)   CODE STATUS: DNR  Allergies  Allergen Reactions  . Sulfa Antibiotics Anaphylaxis    Facial swelling   . Codeine Other (See Comments)    BLACK OUTS  . Doxycycline Nausea Only  . Erythromycin Swelling  . Ibuprofen Other (See Comments)    Stomach pain, muscle spasm, GI bleeding  . Levaquin [Levofloxacin] Other (See Comments)    Insomnia/trouble breathing  . Penicillins Diarrhea    Has patient had a PCN reaction causing immediate rash, facial/tongue/throat swelling, SOB or lightheadedness with hypotension: No Has patient had a PCN reaction causing severe rash involving mucus membranes or skin necrosis: No Has patient had a PCN reaction that required hospitalization Unknown Has patient had a PCN reaction occurring within the last 10 years: No If all of the above answers are "NO", then may proceed with Cephalosporin use.     Chief Complaint  Patient presents with  . Acute Visit    Poor Appetite    HPI:    Past Medical History:  Diagnosis Date  . Allergy   . Anxiety   . Cataract   . 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)   . Chronic kidney disease   . Chronic knee pain   . Depression   . GERD (gastroesophageal reflux disease)   . Hyperlipidemia   . Hypertension   . Thyroid disease     Past Surgical History:  Procedure Laterality Date  . ABDOMINAL HYSTERECTOMY    . CHOLECYSTECTOMY    . FLEXIBLE SIGMOIDOSCOPY N/A 06/05/2019   Procedure: FLEXIBLE SIGMOIDOSCOPY;  Surgeon: Rogene Houston, MD;  Location[facility];  Service: Endoscopy;  Laterality: N/A;  . MOUTH SURGERY    . RIGHT ELBOW      Social History   Socioeconomic History  . Marital status: Widowed    Spouse name: Not on file  . Number of children: Not on file  . Years of education: Not on file  . Highest education level: Not on file  Occupational History  . Not on file  Social Needs  . Financial resource  strain: Not hard at all  . Food insecurity    Worry: Never true    Inability: Never true  . Transportation needs    Medical: Not on file    Non-medical: Not on file  Tobacco Use  . Smoking status: Never Smoker  . Smokeless tobacco: Never Used  Substance and Sexual Activity  . Alcohol use: No  . Drug use: No  . Sexual activity: Never  Lifestyle  . Physical activity    Days per week: Not on file    Minutes per session: Not on file  . Stress: Not on file  Relationships  . Social Herbalist on phone: Not on file    Gets together: Not on file    Attends religious service: Not on file    Active member of club or organization: Not on file    Attends meetings of clubs or organizations: Not on file    Relationship status: Not on file  . Intimate partner violence    Fear of current or ex partner: Not on file    Emotionally abused: Not on file    Physically abused: Not on file    Forced sexual activity: Not on file  Other Topics Concern  . Not on file  Social History Narrative  . Not on file  Family History  Problem Relation Age of Onset  . Arthritis Sister   . Hyperlipidemia Sister   . Hypertension Sister   . Cancer Brother   . Diabetes Brother   . Heart disease Brother       VITAL SIGNS BP 140/70   Pulse 84   Temp (!) 97.1 F (36.2 C) (Oral)   Resp 20   Ht 5' 2"  (1.575 m)   Wt 170 lb 12.8 oz (77.5 kg)   SpO2 95%   BMI 31.24 kg/m   Outpatient Encounter Medications as of 07/19/2019  Medication Sig  . acetaminophen (TYLENOL) 325 MG tablet Take 2 tablets (650 mg total) by mouth every 6 (six) hours as needed for mild pain, fever or headache (or Fever >/= 101).  . Alum & Mag Hydroxide-Simeth (MYLANTA PO) Take by mouth. Suspension; 200-200-20 mg/5 mL; amt: 30 cc; oral  Special Instructions: for heartburn/indigestion Twice A Day - PRN PRN 1, PRN 2  . apixaban (ELIQUIS) 5 MG TABS tablet Take 1 tablet (5 mg total) by mouth 2 (two) times daily.  Marland Kitchen aspirin EC  81 MG tablet Take 1 tablet (81 mg total) by mouth daily with breakfast.  . atorvastatin (LIPITOR) 40 MG tablet Take 1 tablet (40 mg total) by mouth at bedtime. Do not restart until instructed by MD  . Calcium Carbonate-Vitamin D (CALTRATE 600+D PO) Take 1 tablet by mouth daily.  . cetirizine (ZYRTEC) 10 MG chewable tablet Chew 10 mg by mouth daily.  . diclofenac Sodium (VOLTAREN) 1 % GEL Apply 2g topically to painful joints for arthritis to upper extremities. Max dose is 32g per day Four Times A Day 09:00 AM, 01:00 PM, 05:00 PM, 09:00 PM  . diclofenac Sodium (VOLTAREN) 1 % GEL Apply 4g topically to painful joints for arthritis to lower extremities. Max dose is 32g per day Four Times A Day 09:00 AM, 01:00 PM, 05:00 PM, 09:00 PM  . diltiazem (CARDIZEM CD) 240 MG 24 hr capsule Take 1 capsule (240 mg total) by mouth daily.  . DULoxetine (CYMBALTA) 30 MG capsule Take 30 mg by mouth daily. Along with 60 mg to = 90 mg  . DULoxetine (CYMBALTA) 60 MG capsule Take 1 capsule (60 mg total) by mouth daily.  . famotidine (PEPCID AC) 10 MG tablet Take 1 tablet (10 mg total) by mouth daily.  . furosemide (LASIX) 20 MG tablet Take 1 tablet (20 mg total) by mouth daily. For swelling/Fluid  . hydrALAZINE (APRESOLINE) 50 MG tablet Take 1 tablet (50 mg total) by mouth 3 (three) times daily. For BP  . isosorbide mononitrate (IMDUR) 30 MG 24 hr tablet Take 1 tablet (30 mg total) by mouth daily.  Marland Kitchen levothyroxine (SYNTHROID) 50 MCG tablet TAKE 1 TABLET ONCE DAILY  . meclizine (ANTIVERT) 25 MG tablet Take 25 mg by mouth 3 (three) times daily as needed for dizziness.  . Multiple Vitamin (MULTIVITAMIN WITH MINERALS) TABS tablet Take 1 tablet by mouth daily.  . NON FORMULARY Diet Change: Dysphagia 3 (chopped), continue thin liquids (moisten meats with gravy)  . omeprazole (PRILOSEC) 40 MG capsule TAKE 1 CAPSULE DAILY  . ondansetron (ZOFRAN) 4 MG tablet Take 1 tablet (4 mg total) by mouth every 6 (six) hours as needed for  nausea.  . Probiotic Product (RISA-BID PROBIOTIC) TABS Take 1 tablet by mouth daily.  . raloxifene (EVISTA) 60 MG tablet Take 1 tablet (60 mg total) by mouth daily.  . [DISCONTINUED] atorvastatin (LIPITOR) 40 MG tablet Take 1 tablet (40  mg total) by mouth at bedtime.  . [DISCONTINUED] DULoxetine (CYMBALTA) 60 MG capsule Take 1 capsule (60 mg total) by mouth daily.   No facility-administered encounter medications on file as of 07/19/2019.      SIGNIFICANT DIAGNOSTIC EXAMS       ASSESSMENT/ PLAN:    MD is aware of resident's narcotic use and is in agreement with current plan of care. We will attempt to wean resident as appropriate.  Ok Edwards NP [facility]  Contact [phone] Monday through Friday 8am- 5pm  After hours call [phone]

## 2019-07-20 ENCOUNTER — Emergency Department (HOSPITAL_COMMUNITY): Payer: Medicare Other

## 2019-07-20 ENCOUNTER — Non-Acute Institutional Stay (SKILLED_NURSING_FACILITY): Payer: Medicare Other | Admitting: Adult Health

## 2019-07-20 ENCOUNTER — Other Ambulatory Visit: Payer: Self-pay

## 2019-07-20 ENCOUNTER — Encounter (HOSPITAL_COMMUNITY): Payer: Self-pay | Admitting: Emergency Medicine

## 2019-07-20 ENCOUNTER — Encounter: Payer: Self-pay | Admitting: Adult Health

## 2019-07-20 ENCOUNTER — Inpatient Hospital Stay (HOSPITAL_COMMUNITY)
Admission: EM | Admit: 2019-07-20 | Discharge: 2019-07-26 | DRG: 682 | Disposition: A | Discharge status: Hospice | Payer: Medicare Other | Source: Skilled Nursing Facility | Attending: Internal Medicine | Admitting: Internal Medicine

## 2019-07-20 DIAGNOSIS — Z886 Allergy status to analgesic agent status: Secondary | ICD-10-CM

## 2019-07-20 DIAGNOSIS — N179 Acute kidney failure, unspecified: Principal | ICD-10-CM

## 2019-07-20 DIAGNOSIS — E860 Dehydration: Secondary | ICD-10-CM | POA: Diagnosis present

## 2019-07-20 DIAGNOSIS — I480 Paroxysmal atrial fibrillation: Secondary | ICD-10-CM | POA: Diagnosis present

## 2019-07-20 DIAGNOSIS — K219 Gastro-esophageal reflux disease without esophagitis: Secondary | ICD-10-CM | POA: Diagnosis present

## 2019-07-20 DIAGNOSIS — N1832 Chronic kidney disease, stage 3b: Secondary | ICD-10-CM | POA: Diagnosis present

## 2019-07-20 DIAGNOSIS — Z6831 Body mass index (BMI) 31.0-31.9, adult: Secondary | ICD-10-CM

## 2019-07-20 DIAGNOSIS — Z8349 Family history of other endocrine, nutritional and metabolic diseases: Secondary | ICD-10-CM

## 2019-07-20 DIAGNOSIS — G319 Degenerative disease of nervous system, unspecified: Secondary | ICD-10-CM | POA: Diagnosis not present

## 2019-07-20 DIAGNOSIS — I4891 Unspecified atrial fibrillation: Secondary | ICD-10-CM

## 2019-07-20 DIAGNOSIS — Z515 Encounter for palliative care: Secondary | ICD-10-CM | POA: Diagnosis not present

## 2019-07-20 DIAGNOSIS — Z7189 Other specified counseling: Secondary | ICD-10-CM

## 2019-07-20 DIAGNOSIS — Z833 Family history of diabetes mellitus: Secondary | ICD-10-CM

## 2019-07-20 DIAGNOSIS — E861 Hypovolemia: Secondary | ICD-10-CM | POA: Diagnosis present

## 2019-07-20 DIAGNOSIS — G4730 Sleep apnea, unspecified: Secondary | ICD-10-CM | POA: Diagnosis present

## 2019-07-20 DIAGNOSIS — Z79890 Hormone replacement therapy (postmenopausal): Secondary | ICD-10-CM

## 2019-07-20 DIAGNOSIS — I5032 Chronic diastolic (congestive) heart failure: Secondary | ICD-10-CM | POA: Diagnosis not present

## 2019-07-20 DIAGNOSIS — Z7901 Long term (current) use of anticoagulants: Secondary | ICD-10-CM

## 2019-07-20 DIAGNOSIS — Z87440 Personal history of urinary (tract) infections: Secondary | ICD-10-CM

## 2019-07-20 DIAGNOSIS — Z66 Do not resuscitate: Secondary | ICD-10-CM | POA: Diagnosis present

## 2019-07-20 DIAGNOSIS — Z8261 Family history of arthritis: Secondary | ICD-10-CM

## 2019-07-20 DIAGNOSIS — R4182 Altered mental status, unspecified: Secondary | ICD-10-CM

## 2019-07-20 DIAGNOSIS — I130 Hypertensive heart and chronic kidney disease with heart failure and stage 1 through stage 4 chronic kidney disease, or unspecified chronic kidney disease: Secondary | ICD-10-CM | POA: Diagnosis present

## 2019-07-20 DIAGNOSIS — E039 Hypothyroidism, unspecified: Secondary | ICD-10-CM | POA: Diagnosis present

## 2019-07-20 DIAGNOSIS — R339 Retention of urine, unspecified: Secondary | ICD-10-CM | POA: Diagnosis not present

## 2019-07-20 DIAGNOSIS — G9341 Metabolic encephalopathy: Secondary | ICD-10-CM | POA: Diagnosis present

## 2019-07-20 DIAGNOSIS — Z8249 Family history of ischemic heart disease and other diseases of the circulatory system: Secondary | ICD-10-CM

## 2019-07-20 DIAGNOSIS — Z7982 Long term (current) use of aspirin: Secondary | ICD-10-CM

## 2019-07-20 DIAGNOSIS — F329 Major depressive disorder, single episode, unspecified: Secondary | ICD-10-CM | POA: Diagnosis present

## 2019-07-20 DIAGNOSIS — D631 Anemia in chronic kidney disease: Secondary | ICD-10-CM | POA: Diagnosis present

## 2019-07-20 DIAGNOSIS — Z809 Family history of malignant neoplasm, unspecified: Secondary | ICD-10-CM

## 2019-07-20 DIAGNOSIS — Z880 Allergy status to penicillin: Secondary | ICD-10-CM

## 2019-07-20 DIAGNOSIS — Z882 Allergy status to sulfonamides status: Secondary | ICD-10-CM

## 2019-07-20 DIAGNOSIS — E669 Obesity, unspecified: Secondary | ICD-10-CM | POA: Diagnosis present

## 2019-07-20 DIAGNOSIS — E785 Hyperlipidemia, unspecified: Secondary | ICD-10-CM | POA: Diagnosis present

## 2019-07-20 DIAGNOSIS — Z20828 Contact with and (suspected) exposure to other viral communicable diseases: Secondary | ICD-10-CM | POA: Diagnosis present

## 2019-07-20 DIAGNOSIS — E872 Acidosis: Secondary | ICD-10-CM | POA: Diagnosis present

## 2019-07-20 DIAGNOSIS — Z885 Allergy status to narcotic agent status: Secondary | ICD-10-CM

## 2019-07-20 DIAGNOSIS — I959 Hypotension, unspecified: Secondary | ICD-10-CM | POA: Diagnosis present

## 2019-07-20 DIAGNOSIS — E871 Hypo-osmolality and hyponatremia: Secondary | ICD-10-CM

## 2019-07-20 DIAGNOSIS — Z881 Allergy status to other antibiotic agents status: Secondary | ICD-10-CM

## 2019-07-20 DIAGNOSIS — F419 Anxiety disorder, unspecified: Secondary | ICD-10-CM | POA: Diagnosis present

## 2019-07-20 LAB — CBC
HCT: 31.6 % — ABNORMAL LOW (ref 36.0–46.0)
Hemoglobin: 10.3 g/dL — ABNORMAL LOW (ref 12.0–15.0)
MCH: 29.8 pg (ref 26.0–34.0)
MCHC: 32.6 g/dL (ref 30.0–36.0)
MCV: 91.3 fL (ref 80.0–100.0)
Platelets: 265 10*3/uL (ref 150–400)
RBC: 3.46 MIL/uL — ABNORMAL LOW (ref 3.87–5.11)
RDW: 14.8 % (ref 11.5–15.5)
WBC: 9.3 10*3/uL (ref 4.0–10.5)
nRBC: 0 % (ref 0.0–0.2)

## 2019-07-20 LAB — COMPREHENSIVE METABOLIC PANEL
ALT: 37 U/L (ref 0–44)
AST: 41 U/L (ref 15–41)
Albumin: 3.1 g/dL — ABNORMAL LOW (ref 3.5–5.0)
Alkaline Phosphatase: 90 U/L (ref 38–126)
Anion gap: 19 — ABNORMAL HIGH (ref 5–15)
BUN: 96 mg/dL — ABNORMAL HIGH (ref 8–23)
CO2: 17 mmol/L — ABNORMAL LOW (ref 22–32)
Calcium: 8.7 mg/dL — ABNORMAL LOW (ref 8.9–10.3)
Chloride: 92 mmol/L — ABNORMAL LOW (ref 98–111)
Creatinine, Ser: 4.54 mg/dL — ABNORMAL HIGH (ref 0.44–1.00)
GFR calc Af Amer: 10 mL/min — ABNORMAL LOW (ref >60)
GFR calc non Af Amer: 9 mL/min — ABNORMAL LOW (ref >60)
Glucose, Bld: 125 mg/dL — ABNORMAL HIGH (ref 70–99)
Potassium: 4.5 mmol/L (ref 3.5–5.1)
Sodium: 128 mmol/L — ABNORMAL LOW (ref 135–145)
Total Bilirubin: 0.7 mg/dL (ref 0.3–1.2)
Total Protein: 6.8 g/dL (ref 6.5–8.1)

## 2019-07-20 LAB — DIFFERENTIAL
Abs Immature Granulocytes: 0.09 10*3/uL — ABNORMAL HIGH (ref 0.00–0.07)
Basophils Absolute: 0 10*3/uL (ref 0.0–0.1)
Basophils Relative: 0 %
Eosinophils Absolute: 0 10*3/uL (ref 0.0–0.5)
Eosinophils Relative: 0 %
Immature Granulocytes: 1 %
Lymphocytes Relative: 11 %
Lymphs Abs: 1 10*3/uL (ref 0.7–4.0)
Monocytes Absolute: 0.9 10*3/uL (ref 0.1–1.0)
Monocytes Relative: 9 %
Neutro Abs: 7.3 10*3/uL (ref 1.7–7.7)
Neutrophils Relative %: 79 %

## 2019-07-20 LAB — PROTIME-INR
INR: 2.3 — ABNORMAL HIGH (ref 0.8–1.2)
Prothrombin Time: 24.9 seconds — ABNORMAL HIGH (ref 11.4–15.2)

## 2019-07-20 LAB — APTT: aPTT: 41 seconds — ABNORMAL HIGH (ref 24–36)

## 2019-07-20 LAB — SARS CORONAVIRUS 2 (TAT 6-24 HRS): SARS Coronavirus 2: NEGATIVE

## 2019-07-20 LAB — ETHANOL: Alcohol, Ethyl (B): <10 mg/dL (ref <10)

## 2019-07-20 NOTE — ED Triage Notes (Signed)
Pt brought from [facility] with AMS. Per [facility] staff last known well at morning med pass.

## 2019-07-21 ENCOUNTER — Inpatient Hospital Stay (HOSPITAL_COMMUNITY): Payer: Medicare Other

## 2019-07-21 ENCOUNTER — Other Ambulatory Visit: Payer: Self-pay

## 2019-07-21 ENCOUNTER — Encounter (HOSPITAL_COMMUNITY): Payer: Self-pay | Admitting: Family Medicine

## 2019-07-21 DIAGNOSIS — Z79890 Hormone replacement therapy (postmenopausal): Secondary | ICD-10-CM | POA: Diagnosis not present

## 2019-07-21 DIAGNOSIS — Z7189 Other specified counseling: Secondary | ICD-10-CM | POA: Diagnosis not present

## 2019-07-21 DIAGNOSIS — Z8249 Family history of ischemic heart disease and other diseases of the circulatory system: Secondary | ICD-10-CM | POA: Diagnosis not present

## 2019-07-21 DIAGNOSIS — Z7982 Long term (current) use of aspirin: Secondary | ICD-10-CM | POA: Diagnosis not present

## 2019-07-21 DIAGNOSIS — Z8261 Family history of arthritis: Secondary | ICD-10-CM | POA: Diagnosis not present

## 2019-07-21 DIAGNOSIS — E871 Hypo-osmolality and hyponatremia: Secondary | ICD-10-CM | POA: Diagnosis present

## 2019-07-21 DIAGNOSIS — I480 Paroxysmal atrial fibrillation: Secondary | ICD-10-CM | POA: Diagnosis present

## 2019-07-21 DIAGNOSIS — E785 Hyperlipidemia, unspecified: Secondary | ICD-10-CM | POA: Diagnosis present

## 2019-07-21 DIAGNOSIS — Z20828 Contact with and (suspected) exposure to other viral communicable diseases: Secondary | ICD-10-CM | POA: Diagnosis present

## 2019-07-21 DIAGNOSIS — N179 Acute kidney failure, unspecified: Secondary | ICD-10-CM | POA: Diagnosis present

## 2019-07-21 DIAGNOSIS — G9341 Metabolic encephalopathy: Secondary | ICD-10-CM | POA: Diagnosis present

## 2019-07-21 DIAGNOSIS — N1832 Chronic kidney disease, stage 3b: Secondary | ICD-10-CM

## 2019-07-21 DIAGNOSIS — D638 Anemia in other chronic diseases classified elsewhere: Secondary | ICD-10-CM | POA: Diagnosis not present

## 2019-07-21 DIAGNOSIS — I130 Hypertensive heart and chronic kidney disease with heart failure and stage 1 through stage 4 chronic kidney disease, or unspecified chronic kidney disease: Secondary | ICD-10-CM | POA: Diagnosis present

## 2019-07-21 DIAGNOSIS — Z8349 Family history of other endocrine, nutritional and metabolic diseases: Secondary | ICD-10-CM | POA: Diagnosis not present

## 2019-07-21 DIAGNOSIS — K219 Gastro-esophageal reflux disease without esophagitis: Secondary | ICD-10-CM | POA: Diagnosis present

## 2019-07-21 DIAGNOSIS — G319 Degenerative disease of nervous system, unspecified: Secondary | ICD-10-CM | POA: Diagnosis present

## 2019-07-21 DIAGNOSIS — I5032 Chronic diastolic (congestive) heart failure: Secondary | ICD-10-CM | POA: Diagnosis present

## 2019-07-21 DIAGNOSIS — Z66 Do not resuscitate: Secondary | ICD-10-CM | POA: Diagnosis present

## 2019-07-21 DIAGNOSIS — G4730 Sleep apnea, unspecified: Secondary | ICD-10-CM | POA: Diagnosis present

## 2019-07-21 DIAGNOSIS — R4182 Altered mental status, unspecified: Secondary | ICD-10-CM | POA: Diagnosis present

## 2019-07-21 DIAGNOSIS — E039 Hypothyroidism, unspecified: Secondary | ICD-10-CM | POA: Diagnosis present

## 2019-07-21 DIAGNOSIS — Z7901 Long term (current) use of anticoagulants: Secondary | ICD-10-CM | POA: Diagnosis not present

## 2019-07-21 DIAGNOSIS — E872 Acidosis: Secondary | ICD-10-CM | POA: Diagnosis present

## 2019-07-21 DIAGNOSIS — Z809 Family history of malignant neoplasm, unspecified: Secondary | ICD-10-CM | POA: Diagnosis not present

## 2019-07-21 DIAGNOSIS — Z833 Family history of diabetes mellitus: Secondary | ICD-10-CM | POA: Diagnosis not present

## 2019-07-21 DIAGNOSIS — Z515 Encounter for palliative care: Secondary | ICD-10-CM | POA: Diagnosis not present

## 2019-07-21 LAB — COMPREHENSIVE METABOLIC PANEL
ALT: 36 U/L (ref 0–44)
AST: 42 U/L — ABNORMAL HIGH (ref 15–41)
Albumin: 3.1 g/dL — ABNORMAL LOW (ref 3.5–5.0)
Alkaline Phosphatase: 87 U/L (ref 38–126)
Anion gap: 19 — ABNORMAL HIGH (ref 5–15)
BUN: 99 mg/dL — ABNORMAL HIGH (ref 8–23)
CO2: 16 mmol/L — ABNORMAL LOW (ref 22–32)
Calcium: 8.6 mg/dL — ABNORMAL LOW (ref 8.9–10.3)
Chloride: 94 mmol/L — ABNORMAL LOW (ref 98–111)
Creatinine, Ser: 4.66 mg/dL — ABNORMAL HIGH (ref 0.44–1.00)
GFR calc Af Amer: 10 mL/min — ABNORMAL LOW (ref >60)
GFR calc non Af Amer: 8 mL/min — ABNORMAL LOW (ref >60)
Glucose, Bld: 120 mg/dL — ABNORMAL HIGH (ref 70–99)
Potassium: 4.3 mmol/L (ref 3.5–5.1)
Sodium: 129 mmol/L — ABNORMAL LOW (ref 135–145)
Total Bilirubin: 0.9 mg/dL (ref 0.3–1.2)
Total Protein: 7 g/dL (ref 6.5–8.1)

## 2019-07-21 LAB — CBC
HCT: 32.3 % — ABNORMAL LOW (ref 36.0–46.0)
Hemoglobin: 10.2 g/dL — ABNORMAL LOW (ref 12.0–15.0)
MCH: 29.2 pg (ref 26.0–34.0)
MCHC: 31.6 g/dL (ref 30.0–36.0)
MCV: 92.6 fL (ref 80.0–100.0)
Platelets: 268 10*3/uL (ref 150–400)
RBC: 3.49 MIL/uL — ABNORMAL LOW (ref 3.87–5.11)
RDW: 14.9 % (ref 11.5–15.5)
WBC: 9 10*3/uL (ref 4.0–10.5)
nRBC: 0.4 % — ABNORMAL HIGH (ref 0.0–0.2)

## 2019-07-21 LAB — RAPID URINE DRUG SCREEN, HOSP PERFORMED
Amphetamines: NOT DETECTED
Barbiturates: NOT DETECTED
Benzodiazepines: POSITIVE — AB
Cocaine: NOT DETECTED
Opiates: NOT DETECTED
Tetrahydrocannabinol: NOT DETECTED

## 2019-07-21 LAB — URINALYSIS, ROUTINE W REFLEX MICROSCOPIC
Bilirubin Urine: NEGATIVE
Glucose, UA: NEGATIVE mg/dL
Hgb urine dipstick: NEGATIVE
Ketones, ur: NEGATIVE mg/dL
Leukocytes,Ua: NEGATIVE
Nitrite: NEGATIVE
Protein, ur: 30 mg/dL — AB
Specific Gravity, Urine: 1.017 (ref 1.005–1.030)
pH: 5 (ref 5.0–8.0)

## 2019-07-21 LAB — MRSA PCR SCREENING: MRSA by PCR: NEGATIVE

## 2019-07-21 LAB — OSMOLALITY, URINE: Osmolality, Ur: 353 mOsm/kg (ref 300–900)

## 2019-07-21 LAB — PROTEIN / CREATININE RATIO, URINE
Creatinine, Urine: 168.74 mg/dL
Protein Creatinine Ratio: 0.44 mg/mg{Cre} — ABNORMAL HIGH (ref 0.00–0.15)
Total Protein, Urine: 75 mg/dL

## 2019-07-21 LAB — SODIUM, URINE, RANDOM: Sodium, Ur: <10 mmol/L

## 2019-07-21 LAB — CREATININE, URINE, RANDOM: Creatinine, Urine: 167.51 mg/dL

## 2019-07-21 LAB — OSMOLALITY: Osmolality: 311 mOsm/kg — ABNORMAL HIGH (ref 275–295)

## 2019-07-21 MED ORDER — ISOSORBIDE MONONITRATE ER 60 MG PO TB24
30.0000 mg | ORAL_TABLET | Freq: Every day | ORAL | Status: DC
  Filled 2019-07-21 (×2): qty 1

## 2019-07-21 MED ORDER — FAMOTIDINE 20 MG PO TABS
10.0000 mg | ORAL_TABLET | Freq: Every day | ORAL | Status: DC
  Filled 2019-07-21 (×2): qty 1

## 2019-07-21 MED ORDER — ONDANSETRON HCL 4 MG/2ML IJ SOLN: 4.0000 mg | Freq: Four times a day (QID) | INTRAMUSCULAR | Status: DC | PRN

## 2019-07-21 MED ORDER — ONDANSETRON HCL 4 MG PO TABS: 4.0000 mg | ORAL_TABLET | Freq: Four times a day (QID) | ORAL | Status: DC | PRN

## 2019-07-21 MED ORDER — ASPIRIN EC 81 MG PO TBEC
81.0000 mg | DELAYED_RELEASE_TABLET | Freq: Every day | ORAL | Status: DC
  Filled 2019-07-21 (×2): qty 1

## 2019-07-21 MED ORDER — DULOXETINE HCL 30 MG PO CPEP: 30.0000 mg | ORAL_CAPSULE | Freq: Every day | ORAL | Status: DC

## 2019-07-21 MED ORDER — DILTIAZEM HCL ER COATED BEADS 240 MG PO CP24
240.0000 mg | ORAL_CAPSULE | Freq: Every day | ORAL | Status: DC
  Filled 2019-07-21 (×2): qty 1

## 2019-07-21 MED ORDER — SODIUM BICARBONATE 650 MG PO TABS
650.0000 mg | ORAL_TABLET | Freq: Two times a day (BID) | ORAL | Status: DC
  Filled 2019-07-21 (×3): qty 1

## 2019-07-21 MED ORDER — DULOXETINE HCL 60 MG PO CPEP
60.0000 mg | ORAL_CAPSULE | Freq: Every day | ORAL | Status: DC
  Filled 2019-07-21 (×2): qty 1

## 2019-07-21 MED ORDER — APIXABAN 5 MG PO TABS: 5.0000 mg | ORAL_TABLET | Freq: Two times a day (BID) | ORAL | Status: DC

## 2019-07-21 MED ORDER — ATORVASTATIN CALCIUM 40 MG PO TABS
40.0000 mg | ORAL_TABLET | Freq: Every day | ORAL | Status: DC
  Filled 2019-07-21: qty 1

## 2019-07-21 MED ORDER — ACETAMINOPHEN 325 MG PO TABS: 650.0000 mg | ORAL_TABLET | Freq: Four times a day (QID) | ORAL | Status: DC | PRN

## 2019-07-21 MED ORDER — ACETAMINOPHEN 650 MG RE SUPP: 650.0000 mg | Freq: Four times a day (QID) | RECTAL | Status: DC | PRN

## 2019-07-21 MED ORDER — MECLIZINE HCL 12.5 MG PO TABS: 25.0000 mg | ORAL_TABLET | Freq: Three times a day (TID) | ORAL | Status: DC | PRN

## 2019-07-21 MED ORDER — APIXABAN 2.5 MG PO TABS
2.5000 mg | ORAL_TABLET | Freq: Two times a day (BID) | ORAL | Status: DC
  Filled 2019-07-21 (×3): qty 1

## 2019-07-21 MED ORDER — SODIUM CHLORIDE 0.9 % IV SOLN
Freq: Once | INTRAVENOUS | Status: AC
  Administered 2019-07-21: 05:00:00 via INTRAVENOUS

## 2019-07-21 MED ORDER — SODIUM BICARBONATE-DEXTROSE 150-5 MEQ/L-% IV SOLN
150.0000 meq | INTRAVENOUS | Status: DC
  Administered 2019-07-21 (×2): 150 meq via INTRAVENOUS
  Filled 2019-07-21 (×3): qty 1000

## 2019-07-21 MED ORDER — LEVOTHYROXINE SODIUM 50 MCG PO TABS: 50.0000 ug | ORAL_TABLET | Freq: Every day | ORAL | Status: DC

## 2019-07-21 NOTE — Consult Note (Signed)
[facility]  HISTORY AND PHYSICAL  Tamara Stein is an 78 y.o. female.    Chief Complaint: AMS  HPI: Pt is a 30F with a PMH sig for HTN, HLD, spinal cerebellar degeneration, Afib on Eliquis, and CKD with baseline Cr of 1.3 who is now seen in consultation at the request of Dr. Dyann Kief for AKI on CKD.    Pt is unable to provide history and so is obtained from chart review.  She was brought in yesterday to [facility] for evaluation of AMS.  She was reportedly normal at 9 am and was altered at 3 pm so was sent here.  Noted to have Na of 128, K 4.5 Co2 17 Bun 96 Cr 4.5 up from her baseline of 1.3 on 11/30.  WBC ct normal, Hgb 10.3, platelet 265.  Placed on NS @ 100 mL/ hr.  In this setting we are asked to see.  CT head negative for acute process and CXR with mild vasc congestion and small R pleural effusion.    Pt doesn't seem to be in any acute distress.  She is sleeping and opens her eyes to touch and voice but doesn't respond to questions.      PMH: Past Medical History:  Diagnosis Date  . Allergy   . Anxiety   . Cataract   . Cerebellar degeneration ([place])   . Chronic kidney disease   . Chronic knee pain   . Depression   . GERD (gastroesophageal reflux disease)   . Hyperlipidemia   . Hypertension   . Thyroid disease    PSH: Past Surgical History:  Procedure Laterality Date  . ABDOMINAL HYSTERECTOMY    . CHOLECYSTECTOMY    . FLEXIBLE SIGMOIDOSCOPY N/A 06/05/2019   Procedure: FLEXIBLE SIGMOIDOSCOPY;  Surgeon: Rogene Houston, MD;  Location: [facility];  Service: Endoscopy;  Laterality: N/A;  . MOUTH SURGERY    . RIGHT ELBOW      Past Medical History:  Diagnosis Date  . Allergy   . Anxiety   . Cataract   . Cerebellar degeneration ([place])   . Chronic kidney disease   . Chronic knee pain   . Depression   . GERD (gastroesophageal reflux disease)   . Hyperlipidemia   . Hypertension   . Thyroid disease     Medications:   Scheduled: . apixaban  2.5 mg Oral  BID  . aspirin EC  81 mg Oral Q breakfast  . atorvastatin  40 mg Oral QHS  . diltiazem  240 mg Oral Daily  . DULoxetine  60 mg Oral Daily  . famotidine  10 mg Oral Daily  . isosorbide mononitrate  30 mg Oral Daily  . levothyroxine  50 mcg Oral Q0600    Medications Prior to Admission  Medication Sig Dispense Refill  . acetaminophen (TYLENOL) 325 MG tablet Take 2 tablets (650 mg total) by mouth every 6 (six) hours as needed for mild pain, fever or headache (or Fever >/= 101). 120 tablet 2  . Alum & Mag Hydroxide-Simeth (MYLANTA PO) Take by mouth. Suspension; 200-200-20 mg/5 mL; amt: 30 cc; oral  Special Instructions: for heartburn/indigestion Twice A Day - PRN PRN 1, PRN 2    . apixaban (ELIQUIS) 5 MG TABS tablet Take 1 tablet (5 mg total) by mouth 2 (two) times daily. 60 tablet 0  . aspirin EC 81 MG tablet Take 1 tablet (81 mg total) by mouth daily with breakfast. 120 tablet 2  . atorvastatin (LIPITOR) 40 MG tablet  Take 1 tablet (40 mg total) by mouth at bedtime. Do not restart until instructed by MD 90 tablet 0  . Calcium Carbonate-Vitamin D (CALTRATE 600+D PO) Take 1 tablet by mouth daily.    . cetirizine (ZYRTEC) 10 MG chewable tablet Chew 10 mg by mouth daily.    . diclofenac Sodium (VOLTAREN) 1 % GEL Apply 2g topically to painful joints for arthritis to upper extremities. Max dose is 32g per day Four Times A Day 09:00 AM, 01:00 PM, 05:00 PM, 09:00 PM    . diclofenac Sodium (VOLTAREN) 1 % GEL Apply 4g topically to painful joints for arthritis to lower extremities. Max dose is 32g per day Four Times A Day 09:00 AM, 01:00 PM, 05:00 PM, 09:00 PM    . diltiazem (CARDIZEM CD) 240 MG 24 hr capsule Take 1 capsule (240 mg total) by mouth daily. 30 capsule 0  . DULoxetine (CYMBALTA) 30 MG capsule Take 30 mg by mouth daily. Along with 60 mg to = 90 mg    . DULoxetine (CYMBALTA) 60 MG capsule Take 1 capsule (60 mg total) by mouth daily. 90 capsule 0  . famotidine (PEPCID AC) 10 MG tablet Take  1 tablet (10 mg total) by mouth daily. 30 tablet 0  . furosemide (LASIX) 20 MG tablet Take 1 tablet (20 mg total) by mouth daily. For swelling/Fluid 30 tablet 1  . hydrALAZINE (APRESOLINE) 50 MG tablet Take 1 tablet (50 mg total) by mouth 3 (three) times daily. For BP 90 tablet 3  . isosorbide mononitrate (IMDUR) 30 MG 24 hr tablet Take 1 tablet (30 mg total) by mouth daily. 30 tablet 3  . levothyroxine (SYNTHROID) 50 MCG tablet TAKE 1 TABLET ONCE DAILY 90 tablet 1  . meclizine (ANTIVERT) 25 MG tablet Take 25 mg by mouth 3 (three) times daily as needed for dizziness.    . Multiple Vitamin (MULTIVITAMIN WITH MINERALS) TABS tablet Take 1 tablet by mouth daily.    . NON FORMULARY Diet Change: Dysphagia 3 (chopped), continue thin liquids (moisten meats with gravy)    . omeprazole (PRILOSEC) 40 MG capsule TAKE 1 CAPSULE DAILY 90 capsule 1  . ondansetron (ZOFRAN) 4 MG tablet Take 1 tablet (4 mg total) by mouth every 6 (six) hours as needed for nausea. 20 tablet 0  . Probiotic Product (RISA-BID PROBIOTIC) TABS Take 1 tablet by mouth daily.    . raloxifene (EVISTA) 60 MG tablet Take 1 tablet (60 mg total) by mouth daily. 90 tablet 1    ALLERGIES:   Allergies  Allergen Reactions  . Sulfa Antibiotics Anaphylaxis    Facial swelling   . Codeine Other (See Comments)    BLACK OUTS  . Doxycycline Nausea Only  . Erythromycin Swelling  . Ibuprofen Other (See Comments)    Stomach pain, muscle spasm, GI bleeding  . Levaquin [Levofloxacin] Other (See Comments)    Insomnia/trouble breathing  . Penicillins Diarrhea    Has patient had a PCN reaction causing immediate rash, facial/tongue/throat swelling, SOB or lightheadedness with hypotension: No Has patient had a PCN reaction causing severe rash involving mucus membranes or skin necrosis: No Has patient had a PCN reaction that required hospitalization Unknown Has patient had a PCN reaction occurring within the last 10 years: No If all of the above answers  are "NO", then may proceed with Cephalosporin use.     FAM HX: Family History  Problem Relation Age of Onset  . Arthritis Sister   . Hyperlipidemia Sister   .  Hypertension Sister   . Cancer Brother   . Diabetes Brother   . Heart disease Brother     Social History:   reports that she has never smoked. She has never used smokeless tobacco. She reports that she does not drink alcohol or use drugs.  ROS: ROS: unable to obtain d/t AMS  Blood pressure 129/74, pulse 85, temperature 98.2 F (36.8 C), temperature source Oral, resp. rate 18, height 5' 2"  (1.575 m), weight 77.5 kg, SpO2 98 %. PHYSICAL EXAM: Physical Exam  GEN: older woman, lying in bed, appears somewhat ill HEENT EOMI PERRL, dry MM and cracked lips NECK flat neck veins PULM clear anteriorly CV RRR no m/r/g ABD soft, nontender, NABS EXT no LE edema NEURO opens eyes to touch and voice SKIN poor turgor MSK: some bruising on knees bilaterally   Results for orders placed or performed during the hospital encounter of 07/20/19 (from the past 48 hour(s))  Ethanol     Status: None   Collection Time: 07/20/19  9:53 PM  Result Value Ref Range   Alcohol, Ethyl (B) <10 <10 mg/dL    Comment: (NOTE) Lowest detectable limit for serum alcohol is 10 mg/dL. For medical purposes only. Performed at [facility], [address], [place], [place] [address]   Protime-INR     Status: Abnormal   Collection Time: 07/20/19  9:53 PM  Result Value Ref Range   Prothrombin Time 24.9 (H) 11.4 - 15.2 seconds   INR 2.3 (H) 0.8 - 1.2    Comment: (NOTE) INR goal varies based on device and disease states. Performed at [facility], [address], [place], [place] [address]   APTT     Status: Abnormal   Collection Time: 07/20/19  9:53 PM  Result Value Ref Range   aPTT 41 (H) 24 - 36 seconds    Comment:        IF BASELINE aPTT IS ELEVATED, SUGGEST PATIENT RISK ASSESSMENT BE USED TO DETERMINE APPROPRIATE ANTICOAGULANT THERAPY. Performed  at [facility], [address] [place], [place] [address]   CBC     Status: Abnormal   Collection Time: 07/20/19  9:53 PM  Result Value Ref Range   WBC 9.3 4.0 - 10.5 K/uL   RBC 3.46 (L) 3.87 - 5.11 MIL/uL   Hemoglobin 10.3 (L) 12.0 - 15.0 g/dL   HCT 31.6 (L) 36.0 - 46.0 %   MCV 91.3 80.0 - 100.0 fL   MCH 29.8 26.0 - 34.0 pg   MCHC 32.6 30.0 - 36.0 g/dL   RDW 14.8 11.5 - 15.5 %   Platelets 265 150 - 400 K/uL   nRBC 0.0 0.0 - 0.2 %    Comment: Performed at [facility], [address], [place], [place] [address]  Differential     Status: Abnormal   Collection Time: 07/20/19  9:53 PM  Result Value Ref Range   Neutrophils Relative % 79 %   Neutro Abs 7.3 1.7 - 7.7 K/uL   Lymphocytes Relative 11 %   Lymphs Abs 1.0 0.7 - 4.0 K/uL   Monocytes Relative 9 %   Monocytes Absolute 0.9 0.1 - 1.0 K/uL   Eosinophils Relative 0 %   Eosinophils Absolute 0.0 0.0 - 0.5 K/uL   Basophils Relative 0 %   Basophils Absolute 0.0 0.0 - 0.1 K/uL   Immature Granulocytes 1 %   Abs Immature Granulocytes 0.09 (H) 0.00 - 0.07 K/uL    Comment: Performed at [facility], [address]  [place], [place] [address]  Comprehensive metabolic panel     Status: Abnormal   Collection Time: 07/20/19  9:53 PM  Result Value Ref Range   Sodium 128 (L) 135 - 145 mmol/L   Potassium 4.5 3.5 - 5.1 mmol/L   Chloride 92 (L) 98 - 111 mmol/L   CO2 17 (L) 22 - 32 mmol/L   Glucose, Bld 125 (H) 70 - 99 mg/dL   BUN 96 (H) 8 - 23 mg/dL   Creatinine, Ser 4.54 (H) 0.44 - 1.00 mg/dL   Calcium 8.7 (L) 8.9 - 10.3 mg/dL   Total Protein 6.8 6.5 - 8.1 g/dL   Albumin 3.1 (L) 3.5 - 5.0 g/dL   AST 41 15 - 41 U/L   ALT 37 0 - 44 U/L   Alkaline Phosphatase 90 38 - 126 U/L   Total Bilirubin 0.7 0.3 - 1.2 mg/dL   GFR calc non Af Amer 9 (L) >60 mL/min   GFR calc Af Amer 10 (L) >60 mL/min   Anion gap 19 (H) 5 - 15    Comment: Performed at [facility], [address], [place], [place] [address]  CBC     Status: Abnormal   Collection  Time: 07/21/19  5:35 AM  Result Value Ref Range   WBC 9.0 4.0 - 10.5 K/uL   RBC 3.49 (L) 3.87 - 5.11 MIL/uL   Hemoglobin 10.2 (L) 12.0 - 15.0 g/dL   HCT 32.3 (L) 36.0 - 46.0 %   MCV 92.6 80.0 - 100.0 fL   MCH 29.2 26.0 - 34.0 pg   MCHC 31.6 30.0 - 36.0 g/dL   RDW 14.9 11.5 - 15.5 %   Platelets 268 150 - 400 K/uL   nRBC 0.4 (H) 0.0 - 0.2 %    Comment: Performed at [facility], [address], [place], [place] [address]  Comprehensive metabolic panel     Status: Abnormal   Collection Time: 07/21/19  5:35 AM  Result Value Ref Range   Sodium 129 (L) 135 - 145 mmol/L   Potassium 4.3 3.5 - 5.1 mmol/L   Chloride 94 (L) 98 - 111 mmol/L   CO2 16 (L) 22 - 32 mmol/L   Glucose, Bld 120 (H) 70 - 99 mg/dL   BUN 99 (H) 8 - 23 mg/dL   Creatinine, Ser 4.66 (H) 0.44 - 1.00 mg/dL   Calcium 8.6 (L) 8.9 - 10.3 mg/dL   Total Protein 7.0 6.5 - 8.1 g/dL   Albumin 3.1 (L) 3.5 - 5.0 g/dL   AST 42 (H) 15 - 41 U/L   ALT 36 0 - 44 U/L   Alkaline Phosphatase 87 38 - 126 U/L   Total Bilirubin 0.9 0.3 - 1.2 mg/dL   GFR calc non Af Amer 8 (L) >60 mL/min   GFR calc Af Amer 10 (L) >60 mL/min   Anion gap 19 (H) 5 - 15    Comment: Performed at [facility], [address], [place], [place] [address]    CT HEAD WO CONTRAST  Result Date: 07/20/2019 CLINICAL DATA:  Focal neuro deficit greater than 6 hours, altered level of consciousness, unexplained last known well this morning EXAM: CT HEAD WITHOUT CONTRAST TECHNIQUE: Contiguous axial images were obtained from the base of the skull through the vertex without intravenous contrast. COMPARISON:  CT 06/29/2019, MR and CT 04/26/2016 FINDINGS: Brain: No evidence of acute infarction, hemorrhage, hydrocephalus, extra-axial collection or mass lesion/mass effect. Symmetric prominence of the ventricles, cisterns and sulci compatible with parenchymal volume  loss. Such findings are most pronounced in the posterior fossa with atrophy of the cerebellum and brainstem similar to  comparison studies. Patchy areas of white matter hypoattenuation are most compatible with chronic microvascular angiopathy. Vascular: Atherosclerotic calcification of the carotid siphons. No hyperdense vessel. Skull: No calvarial fracture or suspicious osseous lesion. Few prominent venous lakes are similar to comparisons. No scalp swelling or hematoma. Sinuses/Orbits: Paranasal sinuses and mastoid air cells are predominantly clear. Orbital structures are unremarkable aside from prior lens extractions. Other: Moderate bilateral temporomandibular joint osteoarthrosis. IMPRESSION: 1. No acute intracranial abnormality. 2. Parenchymal volume loss most notably in the posterior fossa involving the cerebellum and brainstem is similar to comparison. 3. 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 4. Moderate bilateral temporomandibular joint osteoarthrosis. Electronically Signed   By: Lovena Le M.D.   On: 07/20/2019 22:59   DG Chest Port 1 View  Result Date: 07/20/2019 CLINICAL DATA:  Altered mental status EXAM: PORTABLE CHEST 1 VIEW COMPARISON:  07/11/2019 FINDINGS: Cardiac shadow is enlarged. Aortic calcifications are again seen. The lungs are hypoinflated with mild central vascular congestion. No significant edema is seen. Small right-sided pleural effusion and right basilar atelectasis is noted. IMPRESSION: Mild vascular congestion with small right pleural effusion. Right basilar atelectasis is noted as well. Electronically Signed   By: Inez Catalina M.D.   On: 07/20/2019 22:38    Assessment/Plan  1. AKI on CKD 3: baseline 1.3.  Appears to be hypovolemic mechanism of injury.  Agree with isotonic IVFs but will change to bicarb gtt.  She will need strict I/O and daily weights.  AKI up to 4.5 with BUN 99.  Hopefully we can turn this around without RRT- doesn't seem to be a great candidate, per notes seems to have had a decline in the last several months and was recently placed  at [facility].  I'll do a renal US to make sure no obstruction.  Holding Lasix.  No ACEi/ARB on med list.    2.  Hyponatremia: appears to be hypovolemic hyponatremia--> agree with IVFs, will get urine/ serum lytes and osms  3.  AMS: unclear precipitant, looks like she has a history of UTis, will get UA/ culture and blood cultures too.  Noted she doesn't have leukocytosis.    4.  Metabolic acidosis: IVFs as above  5.  Afib: Eliquis and dilt  6.  Dispo: pending. Pall care may be helpful if doesn't turn around in the next 48ish hrs  Bellatrix Devonshire 07/21/2019, 10:25 AM

## 2019-07-21 NOTE — Progress Notes (Signed)
Patient seen and examined.  Admitted after midnight secondary to acute on chronic renal failure, hyponatremia and metabolic encephalopathy.  Patient is afebrile and during my examination able to open her eyes to voice commands and nodding appropriately while answering questions.  Not his creatinine up to 4.5 with a BUN of 99 and a sodium level of 128 on presentation.  Nephrology service has been consulted and will follow further recommendations.  She is hemodynamically stable and in no distress at this time.  Please refer to H&P written by Dr. Darrick Meigs for further info/details on admission.  Plan: -Continue IV fluids -Close monitoring of electrolytes and renal function trend -Continue holding the/avoiding nephrotoxic agents -Medications to be adjusted to current renal function as per pharmacy's assistance. -Follow clinical response. -Discussion with nephrology service if no improvement appreciated in 48 hours will involve palliative care to further dictate advance directives.  Patient is already DNR and her wishes will be respected.  Per nephrology service not a good candidate for HD.  Barton Dubois MD [phone]

## 2019-07-21 NOTE — ED Provider Notes (Signed)
[facility] EMERGENCY DEPARTMENT Provider Note   CSN: [id number] Arrival date & time: 07/20/19  2143     History   Chief Complaint Chief Complaint  Patient presents with  . Altered Mental Status    HPI Tamara Stein is a 78 y.o. female.     Patient is a resident at [facility].  Patient is a DNR.  Patient sent over for altered mental status.  Last seen normal this morning around 9.  Identified to be abnormal around 3 this afternoon.  But there was a miscommunication eventually patient sent over for evaluation.  Upon arrival here patient awake with eyes tracking but nonverbal but would nod no to not having any pain.  Patient appeared not to be any distress.  Vital signs were normal.     Past Medical History:  Diagnosis Date  . Allergy   . Anxiety   . Cataract   . 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 Chronic kidney disease   . Chronic knee pain   . Depression   . GERD (gastroesophageal reflux disease)   . Hyperlipidemia   . Hypertension   . Thyroid disease     Patient Active Problem List   Diagnosis Date Noted  . 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 Menopausal osteoporosis 07/15/2019  . Major depression, recurrent, chronic ([place]) 07/15/2019  . Infection due to Enterobacter species 07/08/2019  . Acute kidney injury superimposed on CKD ([place]) 07/08/2019  . Acute on chronic diastolic (congestive) heart failure ([place]) 07/08/2019  . Atrial fibrillation with RVR ([place]) 06/22/2019  . Sepsis ([place]) 06/21/2019  . Ascending aortic aneurysm ([place]) 06/13/2019  . Pleural effusion 06/08/2019  . Dyspnea 06/07/2019  . Diarrhea   . Abnormal LFTs   . Symptomatic anemia 06/01/2019  . Nausea vomiting and diarrhea   . Lesion of spleen   . Diverticulitis 05/04/2019  . Internal impingement of left shoulder 12/24/2017  . Anxiety 04/20/2017  . 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 Incontinence of urine 04/20/2017   . UTI (urinary tract infection) 07/15/2016  . Abdominal pain 07/15/2016  . Colitis 07/15/2016  . Syncope and collapse 04/26/2016  . Lower urinary tract infectious disease 04/26/2016  . Fall   . Daytime somnolence 03/10/2016  . Diplopia 03/10/2016  . Insomnia 03/10/2016  . Sleep apnea in adult 03/10/2016  . Imbalance 03/07/2016  . Migraine 03/07/2016  . Tremor 03/07/2016  . Pain of both shoulder joints 02/29/2016  . Arthritis 09/24/2015  . Constipation 06/05/2015  . Depression 06/05/2015  . Hypothyroid 06/05/2015  . 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 GERD (gastroesophageal reflux disease)   . Essential hypertension with goal blood pressure less than 140/90   . Hyperlipidemia   . 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 CKD (chronic kidney disease) stage 3, GFR 30-59 ml/min ([facility]) 12/02/2013    Past Surgical History:  Procedure Laterality Date  . ABDOMINAL HYSTERECTOMY    . CHOLECYSTECTOMY    . FLEXIBLE SIGMOIDOSCOPY N/A 06/05/2019   Procedure: FLEXIBLE SIGMOIDOSCOPY;  Surgeon: Rogene Houston, MD;  Location: [facility] ENDO SUITE;  Service: Endoscopy;  Laterality: N/A;  . MOUTH SURGERY    . RIGHT ELBOW       OB History   No obstetric history on file.      Home Medications    Prior to Admission medications   Medication Sig Start Date End Date Taking? Authorizing Provider  acetaminophen (TYLENOL) 325 MG tablet Take 2 tablets (650  mg total) by mouth every 6 (six) hours as needed for mild pain, fever or headache (or Fever >/= 101). 06/05/19   Roxan Hockey, MD  Alum & Mag Hydroxide-Simeth (MYLANTA PO) Take by mouth. Suspension; 200-200-20 mg/5 mL; amt: 30 cc; oral  Special Instructions: for heartburn/indigestion Twice A Day - PRN PRN 1, PRN 2    [provider]  apixaban (ELIQUIS) 5 MG TABS tablet Take 1 tablet (5 mg total) by mouth 2 (two) times daily. 06/28/19   Kathie Dike, MD  aspirin EC 81 MG tablet Take 1 tablet (81 mg total) by mouth daily with  breakfast. 06/05/19   Denton Brick, Courage, MD  atorvastatin (LIPITOR) 40 MG tablet Take 1 tablet (40 mg total) by mouth at bedtime. Do not restart until instructed by MD 05/14/19   Orson Eva, MD  Calcium Carbonate-Vitamin D (CALTRATE 600+D PO) Take 1 tablet by mouth daily.    [provider]  cetirizine (ZYRTEC) 10 MG chewable tablet Chew 10 mg by mouth daily.    [provider]  diclofenac Sodium (VOLTAREN) 1 % GEL Apply 2g topically to painful joints for arthritis to upper extremities. Max dose is 32g per day Four Times A Day 09:00 AM, 01:00 PM, 05:00 PM, 09:00 PM    [provider]  diclofenac Sodium (VOLTAREN) 1 % GEL Apply 4g topically to painful joints for arthritis to lower extremities. Max dose is 32g per day Four Times A Day 09:00 AM, 01:00 PM, 05:00 PM, 09:00 PM    [provider]  diltiazem (CARDIZEM CD) 240 MG 24 hr capsule Take 1 capsule (240 mg total) by mouth daily. 06/28/19   Kathie Dike, MD  DULoxetine (CYMBALTA) 30 MG capsule Take 30 mg by mouth daily. Along with 60 mg to = 90 mg    [provider]  DULoxetine (CYMBALTA) 60 MG capsule Take 1 capsule (60 mg total) by mouth daily. 04/27/19   Janora Norlander, DO  famotidine (PEPCID AC) 10 MG tablet Take 1 tablet (10 mg total) by mouth daily. 06/20/19   Janora Norlander, DO  furosemide (LASIX) 20 MG tablet Take 1 tablet (20 mg total) by mouth daily. For swelling/Fluid 06/09/19   Roxan Hockey, MD  hydrALAZINE (APRESOLINE) 50 MG tablet Take 1 tablet (50 mg total) by mouth 3 (three) times daily. For BP 06/05/19   Emokpae, Courage, MD  isosorbide mononitrate (IMDUR) 30 MG 24 hr tablet Take 1 tablet (30 mg total) by mouth daily. 06/06/19   Roxan Hockey, MD  levothyroxine (SYNTHROID) 50 MCG tablet TAKE 1 TABLET ONCE DAILY 04/26/19   Ronnie Doss M, DO  meclizine (ANTIVERT) 25 MG tablet Take 25 mg by mouth 3 (three) times daily as needed for dizziness.    [provider]   Multiple Vitamin (MULTIVITAMIN WITH MINERALS) TABS tablet Take 1 tablet by mouth daily.    [provider]  NON FORMULARY Diet Change: Dysphagia 3 (chopped), continue thin liquids (moisten meats with gravy)    [provider]  omeprazole (PRILOSEC) 40 MG capsule TAKE 1 CAPSULE DAILY 04/26/19   Gottschalk, Ashly M, DO  ondansetron (ZOFRAN) 4 MG tablet Take 1 tablet (4 mg total) by mouth every 6 (six) hours as needed for nausea. 06/09/19   Roxan Hockey, MD  Probiotic Product (RISA-BID PROBIOTIC) TABS Take 1 tablet by mouth daily.    [provider]  raloxifene (EVISTA) 60 MG tablet Take 1 tablet (60 mg total) by mouth daily. 04/27/19   Ronnie Doss  M, DO  atorvastatin (LIPITOR) 40 MG tablet Take 1 tablet (40 mg total) by mouth at bedtime. 11/05/18   Janora Norlander, DO  DULoxetine (CYMBALTA) 60 MG capsule Take 1 capsule (60 mg total) by mouth daily. 11/05/18   Janora Norlander, DO    Family History Family History  Problem Relation Age of Onset  . Arthritis Sister   . Hyperlipidemia Sister   . Hypertension Sister   . Cancer Brother   . Diabetes Brother   . Heart disease Brother     Social History Social History   Tobacco Use  . Smoking status: Never Smoker  . Smokeless tobacco: Never Used  Substance Use Topics  . Alcohol use: No  . Drug use: No     Allergies   Sulfa antibiotics, Codeine, Doxycycline, Erythromycin, Ibuprofen, Levaquin [levofloxacin], and Penicillins   Review of Systems Review of Systems  Unable to perform ROS: Mental status change     Physical Exam Updated Vital Signs BP 100/85   Pulse 82   Temp 97.9 F (36.6 C) (Rectal)   Resp (!) 22   Ht 1.575 m (5' 2" )   Wt 77.5 kg   SpO2 95%   BMI 31.24 kg/m   Physical Exam Vitals signs and nursing note reviewed.  Constitutional:      General: She is not in acute distress.    Appearance: She is well-developed. She is obese.  HENT:     Head: Normocephalic and  atraumatic.     Mouth/Throat:     Mouth: Mucous membranes are dry.  Eyes:     Extraocular Movements: Extraocular movements intact.     Conjunctiva/sclera: Conjunctivae normal.     Pupils: Pupils are equal, round, and reactive to light.  Neck:     Musculoskeletal: Normal range of motion and neck supple.  Cardiovascular:     Rate and Rhythm: Normal rate and regular rhythm.     Heart sounds: No murmur.  Pulmonary:     Effort: Pulmonary effort is normal. No respiratory distress.     Breath sounds: Normal breath sounds.  Abdominal:     Palpations: Abdomen is soft.     Tenderness: There is no abdominal tenderness.  Musculoskeletal: Normal range of motion.        General: No swelling.  Skin:    General: Skin is warm and dry.  Neurological:     Mental Status: She is alert.     Comments: Patient is awake.  Decreased tone to bilateral upper extremities.  Lower extremities somewhat contracted.  Patient's eyes track fine.  She seems to understand she nodded no to any pain.  But nonverbal.      ED Treatments / Results  Labs (all labs ordered are listed, but only abnormal results are displayed) Labs Reviewed  PROTIME-INR - Abnormal; Notable for the following components:      Result Value   Prothrombin Time 24.9 (*)    INR 2.3 (*)    All other components within normal limits  APTT - Abnormal; Notable for the following components:   aPTT 41 (*)    All other components within normal limits  CBC - Abnormal; Notable for the following components:   RBC 3.46 (*)    Hemoglobin 10.3 (*)    HCT 31.6 (*)    All other components within normal limits  DIFFERENTIAL - Abnormal; Notable for the following components:   Abs Immature Granulocytes 0.09 (*)    All other components within normal limits  COMPREHENSIVE METABOLIC PANEL - Abnormal; Notable for the following components:   Sodium 128 (*)    Chloride 92 (*)    CO2 17 (*)    Glucose, Bld 125 (*)    BUN 96 (*)    Creatinine, Ser 4.54 (*)     Calcium 8.7 (*)    Albumin 3.1 (*)    GFR calc non Af Amer 9 (*)    GFR calc Af Amer 10 (*)    Anion gap 19 (*)    All other components within normal limits  ETHANOL  RAPID URINE DRUG SCREEN, HOSP PERFORMED  URINALYSIS, ROUTINE W REFLEX MICROSCOPIC  I-STAT CHEM 8, ED    EKG EKG Interpretation  Date/Time:  Wednesday July 20 2019 21:46:23 EST Ventricular Rate:  84 PR Interval:    QRS Duration: 109 QT Interval:  393 QTC Calculation: 465 R Axis:   74 Text Interpretation: Sinus rhythm Low voltage, precordial leads Minimal ST elevation, inferior leads No significant change since last tracing Confirmed by Fredia Sorrow [phone]) on 07/20/2019 9:54:15 PM   Radiology Ct Head Wo Contrast  Result Date: 07/20/2019 CLINICAL DATA:  Focal neuro deficit greater than 6 hours, altered level of consciousness, unexplained last known well this morning EXAM: CT HEAD WITHOUT CONTRAST TECHNIQUE: Contiguous axial images were obtained from the base of the skull through the vertex without intravenous contrast. COMPARISON:  CT 06/29/2019, MR and CT 04/26/2016 FINDINGS: Brain: No evidence of acute infarction, hemorrhage, hydrocephalus, extra-axial collection or mass lesion/mass effect. Symmetric prominence of the ventricles, cisterns and sulci compatible with parenchymal volume loss. Such findings are most pronounced in the posterior fossa with atrophy of the cerebellum and brainstem similar to comparison studies. Patchy areas of white matter hypoattenuation are most compatible with chronic microvascular angiopathy. Vascular: Atherosclerotic calcification of the carotid siphons. No hyperdense vessel. Skull: No calvarial fracture or suspicious osseous lesion. Few prominent venous lakes are similar to comparisons. No scalp swelling or hematoma. Sinuses/Orbits: Paranasal sinuses and mastoid air cells are predominantly clear. Orbital structures are unremarkable aside from prior lens extractions. Other: Moderate  bilateral temporomandibular joint osteoarthrosis. IMPRESSION: 1. No acute intracranial abnormality. 2. Parenchymal volume loss most notably in the posterior fossa involving the cerebellum and brainstem is similar to comparison. 3. 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 4. Moderate bilateral temporomandibular joint osteoarthrosis. Electronically Signed   By: Lovena Le M.D.   On: 07/20/2019 22:59   Dg Chest Port 1 View  Result Date: 07/20/2019 CLINICAL DATA:  Altered mental status EXAM: PORTABLE CHEST 1 VIEW COMPARISON:  07/11/2019 FINDINGS: Cardiac shadow is enlarged. Aortic calcifications are again seen. The lungs are hypoinflated with mild central vascular congestion. No significant edema is seen. Small right-sided pleural effusion and right basilar atelectasis is noted. IMPRESSION: Mild vascular congestion with small right pleural effusion. Right basilar atelectasis is noted as well. Electronically Signed   By: Inez Catalina M.D.   On: 07/20/2019 22:38    Procedures Procedures (including critical care time)  Medications Ordered in ED Medications - No data to display   Initial Impression / Assessment and Plan / ED Course  I have reviewed the triage vital signs and the nursing notes.  Pertinent labs & imaging results that were available during my care of the patient were reviewed by me and considered in my medical decision making (see chart for details).        Patient with altered mental status.  Labs show significant acute kidney injury mandating  admission and some mild hyponatremia.  Head CT without significant abnormalities chest x-ray with a small pleural effusion but nothing significant.  No significant leukocytosis.  Urinalysis still pending.  Patient not febrile not hypotensive.  Oxygen sats room air discussed with hospitalist who will admit.  Patient without spontaneous movement of her extremities when she will move her eyes.  She did not  know to having any pain.  But not communicating.  Patient last seen normal at 9 this morning.  Patient does not seem to have a unilateral focal deficit.  Patient is a DNR.  Final Clinical Impressions(s) / ED Diagnoses   Final diagnoses:  Altered mental status, unspecified altered mental status type  AKI (acute kidney injury) ([place])  Hyponatremia    ED Discharge Orders    None       Fredia Sorrow, MD 07/21/19 0011

## 2019-07-21 NOTE — Progress Notes (Addendum)
CHL production came back on at 0315. System was down from 0100 to 0315. Was unable to review orders at that time. Unable to perform neuros checks every 2 hours. Will start now with orders and neuros. Patient unable to answer questions, patient not alert and only oriented to person.

## 2019-07-21 NOTE — H&P (Addendum)
TRH H&P    Patient Demographics:    Tamara Stein, is a 78 y.o. female  MRN: [id number]  DOB - Jun 02, 1941  Admit Date - 07/20/2019  Referring MD/NP/PA: Aundria Mems  Outpatient Primary MD for the patient is Janora Norlander, DO  Patient coming from: Skilled nursing facility  Chief complaint-altered mental status   HPI:    Tamara Stein  is a 78 y.o. female, with history of spinal cerebellar degeneration, hypertension, atrial fibrillation, on anticoagulation with Eliquis, CKD stage III, anxiety, hypothyroidism, hyperlipidemia was brought to ED from skilled facility with altered mental status.  Patient was last seen normal this morning around 9 AM and was found to be abnormal around 3 PM.  Patient was sent to the ED for further evaluation.  In the ED patient was found to be in acute kidney injury with creatinine 4.54, baseline creatinine 1.39 as of 07/11/2019. Patient is lethargic and unable to provide significant history.  Though she denies chest pain or shortness of breath.     Review of systems:    As in HPI, rest of review of systems unobtainable as patient is very lethargic/somnolent and unable to provide any history.    Past History of the following :    Past Medical History:  Diagnosis Date  . Allergy   . Anxiety   . Cataract   . Cerebellar degeneration ([place])   . Chronic kidney disease   . Chronic knee pain   . Depression   . GERD (gastroesophageal reflux disease)   . Hyperlipidemia   . Hypertension   . Thyroid disease       Past Surgical History:  Procedure Laterality Date  . ABDOMINAL HYSTERECTOMY    . CHOLECYSTECTOMY    . FLEXIBLE SIGMOIDOSCOPY N/A 06/05/2019   Procedure: FLEXIBLE SIGMOIDOSCOPY;  Surgeon: Rogene Houston, MD;  Location: [facility];  Service: Endoscopy;  Laterality: N/A;  . MOUTH SURGERY    . RIGHT ELBOW        Social History:      Social  History   Tobacco Use  . Smoking status: Never Smoker  . Smokeless tobacco: Never Used  Substance Use Topics  . Alcohol use: No       Family History :     Family History  Problem Relation Age of Onset  . Arthritis Sister   . Hyperlipidemia Sister   . Hypertension Sister   . Cancer Brother   . Diabetes Brother   . Heart disease Brother       Home Medications:   Prior to Admission medications   Medication Sig Start Date End Date Taking? Authorizing Provider  acetaminophen (TYLENOL) 325 MG tablet Take 2 tablets (650 mg total) by mouth every 6 (six) hours as needed for mild pain, fever or headache (or Fever >/= 101). 06/05/19   Roxan Hockey, MD  Alum & Mag Hydroxide-Simeth (MYLANTA PO) Take by mouth. Suspension; 200-200-20 mg/5 mL; amt: 30 cc; oral  Special Instructions: for heartburn/indigestion Twice A Day - PRN PRN 1, PRN 2  [provider]  apixaban (ELIQUIS) 5 MG TABS tablet Take 1 tablet (5 mg total) by mouth 2 (two) times daily. 06/28/19   Kathie Dike, MD  aspirin EC 81 MG tablet Take 1 tablet (81 mg total) by mouth daily with breakfast. 06/05/19   Denton Brick, Courage, MD  atorvastatin (LIPITOR) 40 MG tablet Take 1 tablet (40 mg total) by mouth at bedtime. Do not restart until instructed by MD 05/14/19   Orson Eva, MD  Calcium Carbonate-Vitamin D (CALTRATE 600+D PO) Take 1 tablet by mouth daily.    [provider]  cetirizine (ZYRTEC) 10 MG chewable tablet Chew 10 mg by mouth daily.    [provider]  diclofenac Sodium (VOLTAREN) 1 % GEL Apply 2g topically to painful joints for arthritis to upper extremities. Max dose is 32g per day Four Times A Day 09:00 AM, 01:00 PM, 05:00 PM, 09:00 PM    [provider]  diclofenac Sodium (VOLTAREN) 1 % GEL Apply 4g topically to painful joints for arthritis to lower extremities. Max dose is 32g per day Four Times A Day 09:00 AM, 01:00 PM, 05:00 PM, 09:00 PM    [provider]   diltiazem (CARDIZEM CD) 240 MG 24 hr capsule Take 1 capsule (240 mg total) by mouth daily. 06/28/19   Kathie Dike, MD  DULoxetine (CYMBALTA) 30 MG capsule Take 30 mg by mouth daily. Along with 60 mg to = 90 mg    [provider]  DULoxetine (CYMBALTA) 60 MG capsule Take 1 capsule (60 mg total) by mouth daily. 04/27/19   Janora Norlander, DO  famotidine (PEPCID AC) 10 MG tablet Take 1 tablet (10 mg total) by mouth daily. 06/20/19   Janora Norlander, DO  furosemide (LASIX) 20 MG tablet Take 1 tablet (20 mg total) by mouth daily. For swelling/Fluid 06/09/19   Roxan Hockey, MD  hydrALAZINE (APRESOLINE) 50 MG tablet Take 1 tablet (50 mg total) by mouth 3 (three) times daily. For BP 06/05/19   Emokpae, Courage, MD  isosorbide mononitrate (IMDUR) 30 MG 24 hr tablet Take 1 tablet (30 mg total) by mouth daily. 06/06/19   Roxan Hockey, MD  levothyroxine (SYNTHROID) 50 MCG tablet TAKE 1 TABLET ONCE DAILY 04/26/19   Ronnie Doss M, DO  meclizine (ANTIVERT) 25 MG tablet Take 25 mg by mouth 3 (three) times daily as needed for dizziness.    [provider]  Multiple Vitamin (MULTIVITAMIN WITH MINERALS) TABS tablet Take 1 tablet by mouth daily.    [provider]  NON FORMULARY Diet Change: Dysphagia 3 (chopped), continue thin liquids (moisten meats with gravy)    [provider]  omeprazole (PRILOSEC) 40 MG capsule TAKE 1 CAPSULE DAILY 04/26/19   Gottschalk, Ashly M, DO  ondansetron (ZOFRAN) 4 MG tablet Take 1 tablet (4 mg total) by mouth every 6 (six) hours as needed for nausea. 06/09/19   Roxan Hockey, MD  Probiotic Product (RISA-BID PROBIOTIC) TABS Take 1 tablet by mouth daily.    [provider]  raloxifene (EVISTA) 60 MG tablet Take 1 tablet (60 mg total) by mouth daily. 04/27/19   Janora Norlander, DO  atorvastatin (LIPITOR) 40 MG tablet Take 1 tablet (40 mg total) by mouth at bedtime. 11/05/18   Janora Norlander, DO  DULoxetine  (CYMBALTA) 60 MG capsule Take 1 capsule (60 mg total) by mouth daily. 11/05/18   Janora Norlander, DO     Allergies:     Allergies  Allergen Reactions  .  Sulfa Antibiotics Anaphylaxis    Facial swelling   . Codeine Other (See Comments)    BLACK OUTS  . Doxycycline Nausea Only  . Erythromycin Swelling  . Ibuprofen Other (See Comments)    Stomach pain, muscle spasm, GI bleeding  . Levaquin [Levofloxacin] Other (See Comments)    Insomnia/trouble breathing  . Penicillins Diarrhea    Has patient had a PCN reaction causing immediate rash, facial/tongue/throat swelling, SOB or lightheadedness with hypotension: No Has patient had a PCN reaction causing severe rash involving mucus membranes or skin necrosis: No Has patient had a PCN reaction that required hospitalization Unknown Has patient had a PCN reaction occurring within the last 10 years: No If all of the above answers are "NO", then may proceed with Cephalosporin use.      Physical Exam:   Vitals  Blood pressure 115/68, pulse 81, temperature 97.9 F (36.6 C), temperature source Rectal, resp. rate 20, height 5' 2"  (1.575 m), weight 77.5 kg, SpO2 95 %.  1.  General: Appears lethargic  2. Psychiatric: Somnolent but opens eyes to verbal stimuli, answers questions by nodding her head  3. Neurologic: Cranial nerves II through XII grossly intact, moving all extremities  4. HEENMT:  Atraumatic normocephalic, extraocular muscles are intact  5. Respiratory : Clear to auscultation bilaterally  6. Cardiovascular : S1-S2, regular, no murmur auscultated  7. Gastrointestinal:  Abdomen is soft, nontender, no organomegaly     Data Review:    CBC Recent Labs  Lab 07/20/19 2153  WBC 9.3  HGB 10.3*  HCT 31.6*  PLT 265  MCV 91.3  MCH 29.8  MCHC 32.6  RDW 14.8  LYMPHSABS 1.0  MONOABS 0.9  EOSABS 0.0  BASOSABS 0.0    ------------------------------------------------------------------------------------------------------------------  Results for orders placed or performed during the hospital encounter of 07/20/19 (from the past 48 hour(s))  Ethanol     Status: None   Collection Time: 07/20/19  9:53 PM  Result Value Ref Range   Alcohol, Ethyl (B) <10 <10 mg/dL    Comment: (NOTE) Lowest detectable limit for serum alcohol is 10 mg/dL. For medical purposes only. Performed at [facility], [address], [place], [place] [address]   Protime-INR     Status: Abnormal   Collection Time: 07/20/19  9:53 PM  Result Value Ref Range   Prothrombin Time 24.9 (H) 11.4 - 15.2 seconds   INR 2.3 (H) 0.8 - 1.2    Comment: (NOTE) INR goal varies based on device and disease states. Performed at [facility], [address], [place], [place] [address]   APTT     Status: Abnormal   Collection Time: 07/20/19  9:53 PM  Result Value Ref Range   aPTT 41 (H) 24 - 36 seconds    Comment:        IF BASELINE aPTT IS ELEVATED, SUGGEST PATIENT RISK ASSESSMENT BE USED TO DETERMINE APPROPRIATE ANTICOAGULANT THERAPY. Performed at [facility], [address] [place], [place] [address]   CBC     Status: Abnormal   Collection Time: 07/20/19  9:53 PM  Result Value Ref Range   WBC 9.3 4.0 - 10.5 K/uL   RBC 3.46 (L) 3.87 - 5.11 MIL/uL   Hemoglobin 10.3 (L) 12.0 - 15.0 g/dL   HCT 31.6 (L) 36.0 - 46.0 %   MCV 91.3 80.0 - 100.0 fL   MCH 29.8 26.0 - 34.0 pg   MCHC 32.6 30.0 - 36.0 g/dL   RDW 14.8 11.5 - 15.5 %  Platelets 265 150 - 400 K/uL   nRBC 0.0 0.0 - 0.2 %    Comment: Performed at [facility], [address] [place], [place] [address]  Differential     Status: Abnormal   Collection Time: 07/20/19  9:53 PM  Result Value Ref Range   Neutrophils Relative % 79 %   Neutro Abs 7.3 1.7 - 7.7 K/uL   Lymphocytes Relative 11 %   Lymphs Abs 1.0 0.7 - 4.0 K/uL   Monocytes Relative 9 %   Monocytes Absolute 0.9 0.1 - 1.0 K/uL    Eosinophils Relative 0 %   Eosinophils Absolute 0.0 0.0 - 0.5 K/uL   Basophils Relative 0 %   Basophils Absolute 0.0 0.0 - 0.1 K/uL   Immature Granulocytes 1 %   Abs Immature Granulocytes 0.09 (H) 0.00 - 0.07 K/uL    Comment: Performed at [facility], [address], [place], [place] [address]  Comprehensive metabolic panel     Status: Abnormal   Collection Time: 07/20/19  9:53 PM  Result Value Ref Range   Sodium 128 (L) 135 - 145 mmol/L   Potassium 4.5 3.5 - 5.1 mmol/L   Chloride 92 (L) 98 - 111 mmol/L   CO2 17 (L) 22 - 32 mmol/L   Glucose, Bld 125 (H) 70 - 99 mg/dL   BUN 96 (H) 8 - 23 mg/dL   Creatinine, Ser 4.54 (H) 0.44 - 1.00 mg/dL   Calcium 8.7 (L) 8.9 - 10.3 mg/dL   Total Protein 6.8 6.5 - 8.1 g/dL   Albumin 3.1 (L) 3.5 - 5.0 g/dL   AST 41 15 - 41 U/L   ALT 37 0 - 44 U/L   Alkaline Phosphatase 90 38 - 126 U/L   Total Bilirubin 0.7 0.3 - 1.2 mg/dL   GFR calc non Af Amer 9 (L) >60 mL/min   GFR calc Af Amer 10 (L) >60 mL/min   Anion gap 19 (H) 5 - 15    Comment: Performed at [facility], [address], [place], [place] [address]    Chemistries  Recent Labs  Lab 07/20/19 2153  NA 128*  K 4.5  CL 92*  CO2 17*  GLUCOSE 125*  BUN 96*  CREATININE 4.54*  CALCIUM 8.7*  AST 41  ALT 37  ALKPHOS 90  BILITOT 0.7   ------------------------------------------------------------------------------------------------------------------  ------------------------------------------------------------------------------------------------------------------ GFR: Estimated Creatinine Clearance: 9.7 mL/min (A) (by C-G formula based on SCr of 4.54 mg/dL (H)). Liver Function Tests: Recent Labs  Lab 07/20/19 2153  AST 41  ALT 37  ALKPHOS 90  BILITOT 0.7  PROT 6.8  ALBUMIN 3.1*   No results for input(s): LIPASE, AMYLASE in the last 168 hours. No results for input(s): AMMONIA in the last 168 hours. Coagulation Profile: Recent Labs  Lab 07/20/19 2153  INR 2.3*   Cardiac  Enzymes: No results for input(s): CKTOTAL, CKMB, CKMBINDEX, TROPONINI in the last 168 hours. BNP (last 3 results) No results for input(s): PROBNP in the last 8760 hours. HbA1C: No results for input(s): HGBA1C in the last 72 hours. CBG: No results for input(s): GLUCAP in the last 168 hours. Lipid Profile: No results for input(s): CHOL, HDL, LDLCALC, TRIG, CHOLHDL, LDLDIRECT in the last 72 hours. Thyroid Function Tests: No results for input(s): TSH, T4TOTAL, FREET4, T3FREE, THYROIDAB in the last 72 hours. Anemia Panel: No results for input(s): VITAMINB12, FOLATE, FERRITIN, TIBC, IRON, RETICCTPCT in the last 72 hours.  --------------------------------------------------------------------------------------------------------------- Urine analysis:    Component Value Date/Time   COLORURINE  YELLOW 06/29/2019 [place] 06/29/2019 2311   APPEARANCEUR Cloudy (A) 01/25/2019 1425   LABSPEC 1.015 06/29/2019 2311   PHURINE 8.0 06/29/2019 [place] 06/29/2019 2311   HGBUR NEGATIVE 06/29/2019 2311   BILIRUBINUR NEGATIVE 06/29/2019 2311   BILIRUBINUR Negative 01/25/2019 [place] 06/29/2019 2311   PROTEINUR TRACE (A) 06/29/2019 2311   UROBILINOGEN negative 09/06/2015 1155   UROBILINOGEN 0.2 03/25/2014 1359   NITRITE NEGATIVE 06/29/2019 2311   LEUKOCYTESUR NEGATIVE 06/29/2019 2311      Imaging Results:    CT HEAD WO CONTRAST  Result Date: 07/20/2019 CLINICAL DATA:  Focal neuro deficit greater than 6 hours, altered level of consciousness, unexplained last known well this morning EXAM: CT HEAD WITHOUT CONTRAST TECHNIQUE: Contiguous axial images were obtained from the base of the skull through the vertex without intravenous contrast. COMPARISON:  CT 06/29/2019, MR and CT 04/26/2016 FINDINGS: Brain: No evidence of acute infarction, hemorrhage, hydrocephalus, extra-axial collection or mass lesion/mass effect. Symmetric prominence of the ventricles,  cisterns and sulci compatible with parenchymal volume loss. Such findings are most pronounced in the posterior fossa with atrophy of the cerebellum and brainstem similar to comparison studies. Patchy areas of white matter hypoattenuation are most compatible with chronic microvascular angiopathy. Vascular: Atherosclerotic calcification of the carotid siphons. No hyperdense vessel. Skull: No calvarial fracture or suspicious osseous lesion. Few prominent venous lakes are similar to comparisons. No scalp swelling or hematoma. Sinuses/Orbits: Paranasal sinuses and mastoid air cells are predominantly clear. Orbital structures are unremarkable aside from prior lens extractions. Other: Moderate bilateral temporomandibular joint osteoarthrosis. IMPRESSION: 1. No acute intracranial abnormality. 2. Parenchymal volume loss most notably in the posterior fossa involving the cerebellum and brainstem is similar to comparison. 3. 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 4. Moderate bilateral temporomandibular joint osteoarthrosis. Electronically Signed   By: Lovena Le M.D.   On: 07/20/2019 22:59   DG Chest Port 1 View  Result Date: 07/20/2019 CLINICAL DATA:  Altered mental status EXAM: PORTABLE CHEST 1 VIEW COMPARISON:  07/11/2019 FINDINGS: Cardiac shadow is enlarged. Aortic calcifications are again seen. The lungs are hypoinflated with mild central vascular congestion. No significant edema is seen. Small right-sided pleural effusion and right basilar atelectasis is noted. IMPRESSION: Mild vascular congestion with small right pleural effusion. Right basilar atelectasis is noted as well. Electronically Signed   By: Inez Catalina M.D.   On: 07/20/2019 22:38    My personal review of EKG: Rhythm NSR   Assessment & Plan:    Active Problems:   AKI (acute kidney injury) ([place])   1. Acute kidney injury-patient presenting with acute kidney injury, unclear etiology.  Creatinine is 4.54,  baseline creatinine 1.30.  Will start IV normal saline at 100 mL/h.  Will obtain renal ultrasound.  Check urine sodium, urine creatinine.  2. Altered mental status-likely from severe dehydration as above.  Patient is slowly improving.  3. Atrial fibrillation-heart rate is controlled, continue Cardizem to 40 mg daily.  Eliquis daily for anticoagulation.  4. Hypothyroidism-continue Synthroid  5. Hyponatremia-sodium is 128, likely from severe dehydration/AKI as above.  Started on IV normal saline.  Follow BMP in a.m.    DVT Prophylaxis-   Eliquis  AM Labs Ordered, also please review Full Orders   Code Status: DNR  Admission status: Inpatient: Based on patients clinical presentation and evaluation of above clinical data, I have made determination that patient meets Inpatient criteria at this time.  Time spent  in minutes : 60 minutes   Oswald Hillock M.D on 07/21/2019 at 3:01 AM

## 2019-07-22 DIAGNOSIS — E039 Hypothyroidism, unspecified: Secondary | ICD-10-CM

## 2019-07-22 DIAGNOSIS — G9341 Metabolic encephalopathy: Secondary | ICD-10-CM

## 2019-07-22 DIAGNOSIS — D638 Anemia in other chronic diseases classified elsewhere: Secondary | ICD-10-CM

## 2019-07-22 DIAGNOSIS — I482 Chronic atrial fibrillation, unspecified: Secondary | ICD-10-CM

## 2019-07-22 LAB — BASIC METABOLIC PANEL
Anion gap: 17 — ABNORMAL HIGH (ref 5–15)
BUN: 103 mg/dL — ABNORMAL HIGH (ref 8–23)
CO2: 26 mmol/L (ref 22–32)
Calcium: 8 mg/dL — ABNORMAL LOW (ref 8.9–10.3)
Chloride: 91 mmol/L — ABNORMAL LOW (ref 98–111)
Creatinine, Ser: 4.58 mg/dL — ABNORMAL HIGH (ref 0.44–1.00)
GFR calc Af Amer: 10 mL/min — ABNORMAL LOW (ref >60)
GFR calc non Af Amer: 9 mL/min — ABNORMAL LOW (ref >60)
Glucose, Bld: 194 mg/dL — ABNORMAL HIGH (ref 70–99)
Potassium: 3.6 mmol/L (ref 3.5–5.1)
Sodium: 134 mmol/L — ABNORMAL LOW (ref 135–145)

## 2019-07-22 MED ORDER — CHLORHEXIDINE GLUCONATE CLOTH 2 % EX PADS
6.0000 | MEDICATED_PAD | Freq: Every day | CUTANEOUS | Status: DC
  Administered 2019-07-22 – 2019-07-24 (×3): 6 via TOPICAL

## 2019-07-22 MED ORDER — SODIUM CHLORIDE 0.9 % IV SOLN
INTRAVENOUS | Status: AC
  Administered 2019-07-22 – 2019-07-23 (×2): via INTRAVENOUS

## 2019-07-22 NOTE — Progress Notes (Signed)
Held AM meds because patient is NPO.

## 2019-07-22 NOTE — Plan of Care (Signed)
  Problem: Education: Goal: Knowledge of General Education information will improve Description: Including pain rating scale, medication(s)/side effects and non-pharmacologic comfort measures Outcome: Not Progressing

## 2019-07-22 NOTE — Progress Notes (Addendum)
PROGRESS NOTE    Tamara Stein  WER:[id number] DOB: [birth year] DOA: 07/20/2019 PCP: Janora Norlander, DO     Brief Narrative:  As per H&P written by Dr. Darrick Meigs on 07/21/2019 78 y.o. female, with history of spinal cerebellar degeneration, hypertension, atrial fibrillation, on anticoagulation with Eliquis, CKD stage III, anxiety, hypothyroidism, hyperlipidemia was brought to ED from skilled facility with altered mental status.  Patient was last seen normal this morning around 9 AM and was found to be abnormal around 3 PM.  Patient was sent to the ED for further evaluation.  In the ED patient was found to be in acute kidney injury with creatinine 4.54, baseline creatinine 1.39 as of 07/11/2019. Patient is lethargic and unable to provide significant history.  Though she denies chest pain or shortness of breath.  Assessment & Plan: 1-Acute metabolic encephalopathy -In the setting of hyponatremia and uremia -Patient with negative CT scan of the head to suggest acute neurologic deficit. -Prior to admission patient was talkative and able to participate in rehabilitation. -Patient was found with a sodium of 128, BUN 103 and acute on chronic renal failure. -Will continue fluid resuscitation, follow nephrology service recommendations. -Close monitoring of electrolytes and renal function. -Palliative care service has been requested -Patient with a 24 hours and no significant improvement. -Continue minimizing/avoiding nephrotoxic agents.  2-atrial fibrillation -Continue Cardizem -Eliquis dose has been adjusted for renal function. -Continue telemetry monitoring.  3-acute on chronic renal failure -Patient with stage IIIb at baseline -Most recent creatinine around 1.3; on presentation 4.6 -After IV fluids creatinine 4.5 -Renal ultrasound without obstructive uropathy  4-metabolic acidosis -In the setting of renal failure -Continue PO bicarb -Follow electrolytes trend  5-anemia of chronic  kidney disease -No signs of active bleeding -Hemoglobin stable -Continue to monitor trend.  6-HTN -stable -will continue follow VS -continue current antihypertensive agents  7-hyponatremia -in the setting of dehydration and use of diuretics -continue holding diuretics -continue IVF's -Na trending up appropriately.  8-hypothyroidism -Continue Synthroid  9-DNR/DNI -Palliative care service has been consulted for further advanced directives -Continue current management -Prognosis guarded.  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  DVT prophylaxis: Apixaban Code Status: DNR/DNI Family Communication: brother in law updated over the phone.  Disposition Plan: Patient with some slight improvement in her urine output, creatinine and sodium level; BUN has remained at 103.  Still somnolent/lethargic encephalopathic.  Not a good candidate for hemodialysis as per nephrology service recommendation.  Given uncertainty, guarded prognosis and poor outcome, palliative care has been consulted.  Consultants:   Nephrology service  Palliative care  Procedures:  Renal ultrasound: No obstructive uropathy appreciated.  Antimicrobials:  Anti-infectives (From admission, onward)   None      Subjective: No fever, somnolent/lethargic; no nausea, no vomiting.  Increased urine output.  Objective: Vitals:   07/21/19 2100 07/21/19 2300 07/22/19 0300 07/22/19 0700  BP:  115/88 120/90   Pulse:  95 99   Resp:  19 18   Temp:  98.8 F (37.1 C) 98.5 F (36.9 C) 98.5 F (36.9 C)  TempSrc:  Axillary  Axillary  SpO2: 96% 94% 96%   Weight:   75 kg   Height:        Intake/Output Summary (Last 24 hours) at 07/22/2019 1502 Last data filed at 07/22/2019 0400 Gross per 24 hour  Intake 0 ml  Output 1250 ml  Net -1250 ml   Filed Weights   07/20/19  2152 07/22/19 0300  Weight: 77.5 kg 75 kg      Examination: General exam: Somnolent, lethargic, arousable to touch and voice commands; intermittently nodding appropriately.  No fever.  Dry mucous membranes and cracked lips appreciated on exam. Respiratory system: Clear to auscultation. Respiratory effort normal. Cardiovascular system:RRR. No murmurs, rubs, gallops. Gastrointestinal system: Abdomen is nondistended, soft and nontender. No organomegaly or masses felt. Normal bowel sounds heard. Central nervous system: unable to properly assess in current state. But no focal deifficit appreciated.  Extremities: No C/C/E Skin: No rashes, lesions or ulcers; poor skin turgor appreciated. Psychiatry: Mood & affect appropriate.    Data Reviewed: I have personally reviewed following labs and imaging studies  CBC: Recent Labs  Lab 07/20/19 2153 07/21/19 0535  WBC 9.3 9.0  NEUTROABS 7.3  --   HGB 10.3* 10.2*  HCT 31.6* 32.3*  MCV 91.3 92.6  PLT 265 573   Basic Metabolic Panel: Recent Labs  Lab 07/20/19 2153 07/21/19 0535 07/22/19 0545  NA 128* 129* 134*  K 4.5 4.3 3.6  CL 92* 94* 91*  CO2 17* 16* 26  GLUCOSE 125* 120* 194*  BUN 96* 99* 103*  CREATININE 4.54* 4.66* 4.58*  CALCIUM 8.7* 8.6* 8.0*   GFR: Estimated Creatinine Clearance: 9.4 mL/min (A) (by C-G formula based on SCr of 4.58 mg/dL (H)).   Liver Function Tests: Recent Labs  Lab 07/20/19 2153 07/21/19 0535  AST 41 42*  ALT 37 36  ALKPHOS 90 87  BILITOT 0.7 0.9  PROT 6.8 7.0  ALBUMIN 3.1* 3.1*   Coagulation Profile: Recent Labs  Lab 07/20/19 2153  INR 2.3*   Urine analysis:    Component Value Date/Time   COLORURINE AMBER (A) 07/21/2019 1334   APPEARANCEUR HAZY (A) 07/21/2019 1334   APPEARANCEUR Cloudy (A) 01/25/2019 1425   LABSPEC 1.017 07/21/2019 1334   PHURINE 5.0 07/21/2019 1334   GLUCOSEU NEGATIVE 07/21/2019 1334   HGBUR NEGATIVE 07/21/2019 [place] 07/21/2019 1334   BILIRUBINUR Negative 01/25/2019 [place] 07/21/2019 1334   PROTEINUR 30 (A) 07/21/2019 1334   UROBILINOGEN negative 09/06/2015 1155   UROBILINOGEN 0.2 03/25/2014 1359   NITRITE NEGATIVE 07/21/2019 1334   LEUKOCYTESUR NEGATIVE 07/21/2019 1334    Recent Results (from the past 240 hour(s))  SARS CORONAVIRUS 2 (TAT 6-24 HRS)     Status: None   Collection Time: 07/17/19  9:28 PM  Result Value Ref Range Status   SARS Coronavirus 2 NEGATIVE NEGATIVE Final    Comment: (NOTE) SARS-CoV-2 target nucleic acids are NOT DETECTED. The SARS-CoV-2 RNA is generally detectable in upper and lower respiratory specimens during the acute phase of infection. Negative results do not preclude SARS-CoV-2 infection, do not rule out co-infections with other pathogens, and should not be used as the sole basis for treatment or other patient management decisions. Negative results must be combined with clinical observations, patient history, and epidemiological information. The expected result is Negative. Fact Sheet for Patients: SugarRoll.be Fact Sheet for Healthcare Providers: https://www.woods-mathews.com/ This test is not yet approved or cleared by the [place] FDA and  has been authorized for detection and/or diagnosis of SARS-CoV-2 by FDA under an Emergency Use Authorization (EUA). This EUA will remain  in effect (meaning this test can be used) for the duration of the COVID-19 declaration under Section 56 4(b)(1) of the Act, 21 U.S.C. section 360bbb-3(b)(1), unless the authorization is terminated or revoked sooner. Performed at [facility], [place]  [address], [place], [place] [address]   Respiratory Panel by RT PCR (Flu A&B, Covid) -     Status: None   Collection Time: 07/19/19 11:15 AM  Result Value Ref Range Status   SARS Coronavirus 2 by RT PCR NEGATIVE NEGATIVE Final    Comment: (NOTE) SARS-CoV-2 target nucleic acids are NOT DETECTED. The SARS-CoV-2 RNA is generally detectable  in upper respiratoy specimens during the acute phase of infection. The lowest concentration of SARS-CoV-2 viral copies this assay can detect is 131 copies/mL. A negative result does not preclude SARS-Cov-2 infection and should not be used as the sole basis for treatment or other patient management decisions. A negative result may occur with  improper specimen collection/handling, submission of specimen other than nasopharyngeal swab, presence of viral mutation(s) within the areas targeted by this assay, and inadequate number of viral copies (<131 copies/mL). A negative result must be combined with clinical observations, patient history, and epidemiological information. The expected result is Negative. Fact Sheet for Patients:  PinkCheek.be Fact Sheet for Healthcare Providers:  GravelBags.it This test is not yet ap proved or cleared by the [place] FDA and  has been authorized for detection and/or diagnosis of SARS-CoV-2 by FDA under an Emergency Use Authorization (EUA). This EUA will remain  in effect (meaning this test can be used) for the duration of the COVID-19 declaration under Section 564(b)(1) of the Act, 21 U.S.C. section 360bbb-3(b)(1), unless the authorization is terminated or revoked sooner.    Influenza A by PCR NEGATIVE NEGATIVE Final   Influenza B by PCR NEGATIVE NEGATIVE Final    Comment: (NOTE) The Xpert Xpress SARS-CoV-2/FLU/RSV assay is intended as an aid in  the diagnosis of influenza from Nasopharyngeal swab specimens and  should not be used as a sole basis for treatment. Nasal washings and  aspirates are unacceptable for Xpert Xpress SARS-CoV-2/FLU/RSV  testing. Fact Sheet for Patients: PinkCheek.be Fact Sheet for Healthcare Providers: GravelBags.it This test is not yet approved or cleared by the [place] FDA and  has been authorized for  detection and/or diagnosis of SARS-CoV-2 by  FDA under an Emergency Use Authorization (EUA). This EUA will remain  in effect (meaning this test can be used) for the duration of the  Covid-19 declaration under Section 564(b)(1) of the Act, 21  U.S.C. section 360bbb-3(b)(1), unless the authorization is  terminated or revoked. Performed at [facility], [address] [place], [place] [address]   MRSA PCR Screening     Status: None   Collection Time: 07/21/19  5:59 AM   Specimen: Nasal Mucosa; Nasopharyngeal  Result Value Ref Range Status   MRSA by PCR NEGATIVE NEGATIVE Final    Comment:        The GeneXpert MRSA Assay (FDA approved for NASAL specimens only), is one component of a comprehensive MRSA colonization surveillance program. It is not intended to diagnose MRSA infection nor to guide or monitor treatment for MRSA infections. Performed at [facility], [address] [place],  [address]   Culture, blood (routine x 2)     Status: None (Preliminary result)   Collection Time: 07/21/19  2:07 PM   Specimen: BLOOD RIGHT HAND  Result Value Ref Range Status   Specimen Description BLOOD RIGHT HAND  Final   Special Requests   Final    BOTTLES DRAWN AEROBIC AND ANAEROBIC Blood Culture adequate volume   Culture   Final    NO GROWTH < 24 HOURS Performed at [facility], [address], [place],  [place] [address]    Report Status PENDING  Incomplete  Culture, blood (routine x 2)     Status: None (Preliminary result)   Collection Time: 07/21/19  2:07 PM   Specimen: BLOOD LEFT ARM  Result Value Ref Range Status   Specimen Description BLOOD LEFT ARM  Final   Special Requests   Final    BOTTLES DRAWN AEROBIC AND ANAEROBIC Blood Culture adequate volume   Culture   Final    NO GROWTH < 24 HOURS Performed at [facility], [address], [place], [place] [address]    Report Status PENDING  Incomplete     Radiology Studies: CT HEAD WO CONTRAST  Result Date:  07/20/2019 CLINICAL DATA:  Focal neuro deficit greater than 6 hours, altered level of consciousness, unexplained last known well this morning EXAM: CT HEAD WITHOUT CONTRAST TECHNIQUE: Contiguous axial images were obtained from the base of the skull through the vertex without intravenous contrast. COMPARISON:  CT 06/29/2019, MR and CT 04/26/2016 FINDINGS: Brain: No evidence of acute infarction, hemorrhage, hydrocephalus, extra-axial collection or mass lesion/mass effect. Symmetric prominence of the ventricles, cisterns and sulci compatible with parenchymal volume loss. Such findings are most pronounced in the posterior fossa with atrophy of the cerebellum and brainstem similar to comparison studies. Patchy areas of white matter hypoattenuation are most compatible with chronic microvascular angiopathy. Vascular: Atherosclerotic calcification of the carotid siphons. No hyperdense vessel. Skull: No calvarial fracture or suspicious osseous lesion. Few prominent venous lakes are similar to comparisons. No scalp swelling or hematoma. Sinuses/Orbits: Paranasal sinuses and mastoid air cells are predominantly clear. Orbital structures are unremarkable aside from prior lens extractions. Other: Moderate bilateral temporomandibular joint osteoarthrosis. IMPRESSION: 1. No acute intracranial abnormality. 2. Parenchymal volume loss most notably in the posterior fossa involving the cerebellum and brainstem is similar to comparison. 3. 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 4. Moderate bilateral temporomandibular joint osteoarthrosis. Electronically Signed   By: Lovena Le M.D.   On: 07/20/2019 22:59   Renal Ultrasound  Result Date: 07/21/2019 CLINICAL DATA:  Acute kidney injury. EXAM: RENAL / URINARY TRACT ULTRASOUND COMPLETE COMPARISON:  CT abdomen and pelvis 06/29/2019. FINDINGS: Right Kidney: Renal measurements: 9.6 x 4.4 x 4.5 cm = volume: 99 mL . Echogenicity within normal  limits. No solid mass or hydronephrosis visualized. 1.2 cm simple cyst noted. Left Kidney: Renal measurements: 9.5 x 4.4 x 4.5 cm = volume: 85 mL. Echogenicity within normal limits. No solid mass or hydronephrosis visualized. Simple 0.8 cm cyst noted. Bladder: Appears normal for degree of bladder distention. Other: None. IMPRESSION: Negative for hydronephrosis.  No acute abnormality. Electronically Signed   By: Inge Rise M.D.   On: 07/21/2019 12:51   DG Chest Port 1 View  Result Date: 07/20/2019 CLINICAL DATA:  Altered mental status EXAM: PORTABLE CHEST 1 VIEW COMPARISON:  07/11/2019 FINDINGS: Cardiac shadow is enlarged. Aortic calcifications are again seen. The lungs are hypoinflated with mild central vascular congestion. No significant edema is seen. Small right-sided pleural effusion and right basilar atelectasis is noted. IMPRESSION: Mild vascular congestion with small right pleural effusion. Right basilar atelectasis is noted as well. Electronically Signed   By: Inez Catalina M.D.   On: 07/20/2019 22:38    Scheduled Meds: . apixaban  2.5 mg Oral BID  . aspirin EC  81 mg Oral Q breakfast  . atorvastatin  40 mg Oral QHS  . diltiazem  240 mg Oral Daily  . DULoxetine  60 mg Oral Daily  .  famotidine  10 mg Oral Daily  . isosorbide mononitrate  30 mg Oral Daily  . levothyroxine  50 mcg Oral Q0600  . sodium bicarbonate  650 mg Oral BID   Continuous Infusions: . sodium chloride       LOS: 1 day    Time spent: 35 minutes. Greater than 50% of this time was spent in direct contact with the patient, coordinating care and discussing relevant ongoing clinical issues, including acute on chronic renal failure, with uremia, dehydration and hypernatremia.  Patient remains with altered mentation, not talking much and very somnolent/lethargic.  Increased urine output appreciated after fluid resuscitation, sodium levels also better; but BUN of 103 congestive slight improvement in creatinine level.  Case discussed with nephrology service.    Barton Dubois, MD [facility] Pager [phone]   07/22/2019, 3:02 PM

## 2019-07-22 NOTE — Progress Notes (Signed)
[facility] Progress Note    Assessment/ Plan:   1. AKI on CKD 3: baseline 1.3.  Appears to be hypovolemic mechanism of injury.  Agree with isotonic IVFs; CO2 26 this AM, change back to NS x 1 day.  She will need strict I/O and daily weights.  AKI up to 4.5 with BUN 99.  Per notes seems to have had a decline in the last several months and was recently placed at [facility].  Renal US with no obstruction.  Holding Lasix.  No ACEi/ARB on med list.  She is not a great candidate for RRT as it is unlikely to add to quality of life in the setting of her recent clinical decline.  Looks like Cr is down but BUN is still pretty high--> I am hoping numbers improve more with continued hydration but may be a good idea to call palliative today.  2.  Hyponatremia: appears to be hypovolemic hyponatremia--> continue IVFs  3.  AMS: unclear precipitant, UA is OK, CT head negative, blood cultures NGTD, Utox + for benzos, not on home med list in Epic, maybe this is a new med for her?  4.  Metabolic acidosis: stop bicarb gtt, continue PO bicarb  5.  Afib: Eliquis and dilt  6.  Dispo: pending. Pall care c/s as above  Subjective:    Hyponatremia better, still with AMS.  Had good UOP yesterday on IVFs; Cr down slightly but BUN up at 103. Utox + for benzos.     Objective:   BP 120/90 (BP Location: Left Arm)   Pulse 99   Temp 98.5 F (36.9 C)   Resp 18   Ht 5' 2"  (1.575 m)   Wt 75 kg   SpO2 96%   BMI 30.24 kg/m   Intake/Output Summary (Last 24 hours) at 07/22/2019 0855 Last data filed at 07/22/2019 0400 Gross per 24 hour  Intake 0 ml  Output 1250 ml  Net -1250 ml   Weight change: -2.474 kg  Physical Exam: GEN: older woman, lying in bed, sleeping, arousable to touch HEENT EOMI PERRL, dry MM and cracked lips--> better this AM NECK flat neck veins PULM clear anteriorly CV RRR no m/r/g ABD soft, nontender, NABS EXT no LE edema NEURO opens eyes to touch and voice SKIN poor turgor,  slightly improved MSK: some bruising on knees bilaterally  Imaging: CT HEAD WO CONTRAST  Result Date: 07/20/2019 CLINICAL DATA:  Focal neuro deficit greater than 6 hours, altered level of consciousness, unexplained last known well this morning EXAM: CT HEAD WITHOUT CONTRAST TECHNIQUE: Contiguous axial images were obtained from the base of the skull through the vertex without intravenous contrast. COMPARISON:  CT 06/29/2019, MR and CT 04/26/2016 FINDINGS: Brain: No evidence of acute infarction, hemorrhage, hydrocephalus, extra-axial collection or mass lesion/mass effect. Symmetric prominence of the ventricles, cisterns and sulci compatible with parenchymal volume loss. Such findings are most pronounced in the posterior fossa with atrophy of the cerebellum and brainstem similar to comparison studies. Patchy areas of white matter hypoattenuation are most compatible with chronic microvascular angiopathy. Vascular: Atherosclerotic calcification of the carotid siphons. No hyperdense vessel. Skull: No calvarial fracture or suspicious osseous lesion. Few prominent venous lakes are similar to comparisons. No scalp swelling or hematoma. Sinuses/Orbits: Paranasal sinuses and mastoid air cells are predominantly clear. Orbital structures are unremarkable aside from prior lens extractions. Other: Moderate bilateral temporomandibular joint osteoarthrosis. IMPRESSION: 1. No acute intracranial abnormality. 2. Parenchymal volume loss most notably in the posterior fossa involving the  cerebellum and brainstem is similar to comparison. 3. 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 4. Moderate bilateral temporomandibular joint osteoarthrosis. Electronically Signed   By: Lovena Le M.D.   On: 07/20/2019 22:59   Renal Ultrasound  Result Date: 07/21/2019 CLINICAL DATA:  Acute kidney injury. EXAM: RENAL / URINARY TRACT ULTRASOUND COMPLETE COMPARISON:  CT abdomen and pelvis 06/29/2019.  FINDINGS: Right Kidney: Renal measurements: 9.6 x 4.4 x 4.5 cm = volume: 99 mL . Echogenicity within normal limits. No solid mass or hydronephrosis visualized. 1.2 cm simple cyst noted. Left Kidney: Renal measurements: 9.5 x 4.4 x 4.5 cm = volume: 85 mL. Echogenicity within normal limits. No solid mass or hydronephrosis visualized. Simple 0.8 cm cyst noted. Bladder: Appears normal for degree of bladder distention. Other: None. IMPRESSION: Negative for hydronephrosis.  No acute abnormality. Electronically Signed   By: Inge Rise M.D.   On: 07/21/2019 12:51   DG Chest Port 1 View  Result Date: 07/20/2019 CLINICAL DATA:  Altered mental status EXAM: PORTABLE CHEST 1 VIEW COMPARISON:  07/11/2019 FINDINGS: Cardiac shadow is enlarged. Aortic calcifications are again seen. The lungs are hypoinflated with mild central vascular congestion. No significant edema is seen. Small right-sided pleural effusion and right basilar atelectasis is noted. IMPRESSION: Mild vascular congestion with small right pleural effusion. Right basilar atelectasis is noted as well. Electronically Signed   By: Inez Catalina M.D.   On: 07/20/2019 22:38    Labs: BMET Recent Labs  Lab 07/20/19 2153 07/21/19 0535 07/22/19 0545  NA 128* 129* 134*  K 4.5 4.3 3.6  CL 92* 94* 91*  CO2 17* 16* 26  GLUCOSE 125* 120* 194*  BUN 96* 99* 103*  CREATININE 4.54* 4.66* 4.58*  CALCIUM 8.7* 8.6* 8.0*   CBC Recent Labs  Lab 07/20/19 2153 07/21/19 0535  WBC 9.3 9.0  NEUTROABS 7.3  --   HGB 10.3* 10.2*  HCT 31.6* 32.3*  MCV 91.3 92.6  PLT 265 268    Medications:    . apixaban  2.5 mg Oral BID  . aspirin EC  81 mg Oral Q breakfast  . atorvastatin  40 mg Oral QHS  . diltiazem  240 mg Oral Daily  . DULoxetine  60 mg Oral Daily  . famotidine  10 mg Oral Daily  . isosorbide mononitrate  30 mg Oral Daily  . levothyroxine  50 mcg Oral Q0600  . sodium bicarbonate  650 mg Oral BID      Madelon Lips MD 07/22/2019, 8:55 AM

## 2019-07-22 NOTE — Progress Notes (Signed)
No urine output today. Bladder scanned patient and only 158m present.

## 2019-07-22 NOTE — TOC Initial Note (Signed)
Transition of Care [facility]) - Initial/Assessment Note    Patient Details  Name: Tamara Stein MRN: [id number] Date of Birth: Jan 01, 1941  Transition of Care [facility]) CM/SW Contact:    Halimah Bewick, Chauncey Reading, RN Phone Number: 07/22/2019, 12:57 PM  Clinical Narrative:    Patient is from [facility].  She can return back, will need to have a covid test within 72 hours of return.                Expected Discharge Plan: Skilled Nursing Facility Barriers to Discharge: Continued Medical Work up     Expected Discharge Plan and Services Expected Discharge Plan: [place]          Activities of Daily Living Home Assistive Devices/Equipment: None ADL Screening (condition at time of admission) Patient's cognitive ability adequate to safely complete daily activities?: No Is the patient deaf or have difficulty hearing?: No Does the patient have difficulty seeing, even when wearing glasses/contacts?: No Does the patient have difficulty concentrating, remembering, or making decisions?: Yes Patient able to express need for assistance with ADLs?: No Does the patient have difficulty dressing or bathing?: Yes Independently performs ADLs?: No Communication: Dependent Is this a change from baseline?: Pre-admission baseline Dressing (OT): Dependent Is this a change from baseline?: Pre-admission baseline Grooming: Dependent Is this a change from baseline?: Pre-admission baseline Feeding: Dependent Is this a change from baseline?: Pre-admission baseline Bathing: Dependent Is this a change from baseline?: Pre-admission baseline Toileting: Dependent Is this a change from baseline?: Pre-admission baseline In/Out Bed: Dependent Is this a change from baseline?: Pre-admission baseline Walks in Home: Dependent Is this a change from baseline?: Pre-admission baseline Does the patient have difficulty walking or climbing stairs?: Yes Weakness of Legs: Both Weakness of  Arms/Hands: Both     Admission diagnosis:  Hyponatremia [E87.1] AKI (acute kidney injury) ([place]) [N17.9] Altered mental status, unspecified altered mental status type [R41.82] Patient Active Problem List   Diagnosis Date Noted  . AKI (acute kidney injury) ([place]) 07/21/2019  . 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 Menopausal osteoporosis 07/15/2019  . Major depression, recurrent, chronic ([place]) 07/15/2019  . Infection due to Enterobacter species 07/08/2019  . Acute kidney injury superimposed on CKD ([place]) 07/08/2019  . Acute on chronic diastolic (congestive) heart failure ([place]) 07/08/2019  . Atrial fibrillation with RVR ([place]) 06/22/2019  . Sepsis ([place]) 06/21/2019  . Ascending aortic aneurysm ([place]) 06/13/2019  . Pleural effusion 06/08/2019  . Dyspnea 06/07/2019  . Diarrhea   . Abnormal LFTs   . Symptomatic anemia 06/01/2019  . Nausea vomiting and diarrhea   . Lesion of spleen   . Diverticulitis 05/04/2019  . Internal impingement of left shoulder 12/24/2017  . Anxiety 04/20/2017  . 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 Incontinence of urine 04/20/2017  . UTI (urinary tract infection) 07/15/2016  . Abdominal pain 07/15/2016  . Colitis 07/15/2016  . Syncope and collapse 04/26/2016  . Lower urinary tract infectious disease 04/26/2016  . Fall   . Daytime somnolence 03/10/2016  . Diplopia 03/10/2016  . Insomnia 03/10/2016  . Sleep apnea in adult 03/10/2016  . Imbalance 03/07/2016  . Migraine 03/07/2016  . Tremor 03/07/2016  . Pain of both shoulder joints 02/29/2016  . Arthritis 09/24/2015  . Constipation 06/05/2015  . Depression 06/05/2015  . Hypothyroid 06/05/2015  . 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 GERD (gastroesophageal reflux disease)   . Essential hypertension with goal blood pressure less than  140/90   . Hyperlipidemia   . 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 CKD (chronic kidney disease)  stage 3, GFR 30-59 ml/min (HCC) 12/02/2013   PCP:  Janora Norlander, DO Pharmacy:   [id number] [id number]: [phone]: [phone] [facility] [place] [facility] [id number]: [phone]: [phone] [place] [place] [id number]: [phone]: [phone]    Social Determinants of Health ([place] Interventions    Readmission Risk Interventions Readmission Risk Prevention Plan 05/07/2019  Post Dischage Appt Complete  Medication Screening Complete  Transportation Screening Complete  Some recent data might be hidden

## 2019-07-22 NOTE — Care Management Important Message (Signed)
Important Message  Patient Details  Name: Tamara Stein MRN: [id number] Date of Birth: 1940/11/10   Medicare Important Message Given:  Yes     [name] 07/22/2019, 3:47 PM

## 2019-07-23 ENCOUNTER — Other Ambulatory Visit: Payer: Self-pay | Admitting: Internal Medicine

## 2019-07-23 DIAGNOSIS — Z66 Do not resuscitate: Secondary | ICD-10-CM

## 2019-07-23 DIAGNOSIS — Z9189 Other specified personal risk factors, not elsewhere classified: Secondary | ICD-10-CM

## 2019-07-23 LAB — BASIC METABOLIC PANEL WITH GFR
Anion gap: 15 (ref 5–15)
BUN: 116 mg/dL — ABNORMAL HIGH (ref 8–23)
CO2: 22 mmol/L (ref 22–32)
Calcium: 7.9 mg/dL — ABNORMAL LOW (ref 8.9–10.3)
Chloride: 102 mmol/L (ref 98–111)
Creatinine, Ser: 3.69 mg/dL — ABNORMAL HIGH (ref 0.44–1.00)
GFR calc Af Amer: 13 mL/min — ABNORMAL LOW (ref >60)
GFR calc non Af Amer: 11 mL/min — ABNORMAL LOW (ref >60)
Glucose, Bld: 135 mg/dL — ABNORMAL HIGH (ref 70–99)
Potassium: 3.4 mmol/L — ABNORMAL LOW (ref 3.5–5.1)
Sodium: 139 mmol/L (ref 135–145)

## 2019-07-23 LAB — URINE CULTURE: Culture: NO GROWTH

## 2019-07-23 MED ORDER — SODIUM CHLORIDE 0.9 % IV SOLN
INTRAVENOUS | Status: DC
  Administered 2019-07-23 – 2019-07-24 (×2): via INTRAVENOUS

## 2019-07-23 MED ORDER — LEVOTHYROXINE SODIUM 100 MCG/5ML IV SOLN
25.0000 ug | Freq: Every day | INTRAVENOUS | Status: DC
  Administered 2019-07-24 – 2019-07-26 (×3): 25 ug via INTRAVENOUS
  Filled 2019-07-23 (×4): qty 5

## 2019-07-23 MED ORDER — METOPROLOL TARTRATE 5 MG/5ML IV SOLN
2.5000 mg | Freq: Three times a day (TID) | INTRAVENOUS | Status: DC
  Administered 2019-07-23 – 2019-07-26 (×8): 2.5 mg via INTRAVENOUS
  Filled 2019-07-23 (×8): qty 5

## 2019-07-23 MED ORDER — METOPROLOL TARTRATE 5 MG/5ML IV SOLN
5.0000 mg | Freq: Three times a day (TID) | INTRAVENOUS | Status: DC
  Administered 2019-07-23: 5 mg via INTRAVENOUS
  Filled 2019-07-23: qty 5

## 2019-07-23 MED ORDER — HEPARIN SODIUM (PORCINE) 5000 UNIT/ML IJ SOLN
5000.0000 [IU] | Freq: Three times a day (TID) | INTRAMUSCULAR | Status: DC
  Administered 2019-07-23 – 2019-07-26 (×8): 5000 [IU] via SUBCUTANEOUS
  Filled 2019-07-23 (×9): qty 1

## 2019-07-23 NOTE — Progress Notes (Signed)
Patient ID: Tamara Stein, female   DOB: Aug 17, 1940, 78 y.o.   MRN: [id number] [facility] Progress Note   Assessment/ Plan:   1. Acute kidney Injury on chronic kidney disease stage III: Baseline creatinine around 1.3.  Current acute kidney injury suspected to have been hemodynamically mediated and seemingly improving with intravenous fluids.  She is nonoliguric and without any evidence of volume overload.  Given her underlying functional status and burden of comorbidities, recommend against pursuing chronic hemodialysis.  I will check labs today as well as tomorrow to follow renal function/electrolytes and help direct therapies.  Awaiting palliative care input.  Discontinue sodium bicarbonate. 2.  Hyponatremia: This is hypervolemic hyponatremia that has improved at the acceptable rate of correction with isotonic sodium bicarbonate/saline.  Await labs from today. 3.  Altered level of consciousness: Suspected initially to be from severe azotemia and hyponatremia associated with her acute kidney injury however, this appears to remain persistent in spite of attempts at rectifying underlying metabolic abnormality.  Medication list reviewed. 4.  Atrial fibrillation: With low-grade intermittent tachycardia on diltiazem and apixaban.  Unfortunately, mental status not allowing reliable administration of oral medications.  Subjective:   Patient laying in bed with eyes open but unresponsive to questions/commands.  Tracks me around room.  No acute events overnight.   Objective:   BP 136/81   Pulse (!) 101   Temp 98.9 F (37.2 C) (Oral)   Resp 16   Ht 5' 2"  (1.575 m)   Wt 77.9 kg   SpO2 93%   BMI 31.41 kg/m   Intake/Output Summary (Last 24 hours) at 07/23/2019 1328 Last data filed at 07/23/2019 0500 Gross per 24 hour  Intake 665 ml  Output 1000 ml  Net -335 ml   Weight change: 2.9 kg  Physical Exam: Gen: Appears comfortable resting in bed, awake, tracks around room but  unresponsive CVS: Pulse irregularly irregular, normal rate when examined.  S1 and S2 normal. Resp: Coarse/transmitted breath sounds bilaterally without distinct rales or rhonchi Abd: Soft, obese, nontender Ext: Flaccid lower extremities with no obvious pitting edema  Imaging: No results found.  Labs: BMET Recent Labs  Lab 07/20/19 2153 07/21/19 0535 07/22/19 0545  NA 128* 129* 134*  K 4.5 4.3 3.6  CL 92* 94* 91*  CO2 17* 16* 26  GLUCOSE 125* 120* 194*  BUN 96* 99* 103*  CREATININE 4.54* 4.66* 4.58*  CALCIUM 8.7* 8.6* 8.0*   CBC Recent Labs  Lab 07/20/19 2153 07/21/19 0535  WBC 9.3 9.0  NEUTROABS 7.3  --   HGB 10.3* 10.2*  HCT 31.6* 32.3*  MCV 91.3 92.6  PLT 265 268    Medications:    . apixaban  2.5 mg Oral BID  . aspirin EC  81 mg Oral Q breakfast  . atorvastatin  40 mg Oral QHS  . Chlorhexidine Gluconate Cloth  6 each Topical Daily  . diltiazem  240 mg Oral Daily  . DULoxetine  60 mg Oral Daily  . famotidine  10 mg Oral Daily  . isosorbide mononitrate  30 mg Oral Daily  . levothyroxine  50 mcg Oral Q0600  . sodium bicarbonate  650 mg Oral BID   Elmarie Shiley, MD 07/23/2019, 1:28 PM

## 2019-07-23 NOTE — Progress Notes (Signed)
cannot get patient to take any of her medications, will open eyes briefly and stare off in space but does not make any verbal communication and then goes back to sleep. Medications held, Dr. Dyann Kief made aware

## 2019-07-23 NOTE — Progress Notes (Signed)
PROGRESS NOTE    Tamara Stein  MGQ:[id number] DOB: Jan 03, 1941 DOA: 07/20/2019 PCP: Janora Norlander, DO     Brief Narrative:  As per H&P written by Dr. Darrick Meigs on 07/21/2019 78 y.o. female, with history of spinal cerebellar degeneration, hypertension, atrial fibrillation, on anticoagulation with Eliquis, CKD stage III, anxiety, hypothyroidism, hyperlipidemia was brought to ED from skilled facility with altered mental status.  Patient was last seen normal this morning around 9 AM and was found to be abnormal around 3 PM.  Patient was sent to the ED for further evaluation.  In the ED patient was found to be in acute kidney injury with creatinine 4.54, baseline creatinine 1.39 as of 07/11/2019. Patient is lethargic and unable to provide significant history.  Though she denies chest pain or shortness of breath.  Assessment & Plan: 1-Acute metabolic encephalopathy -In the setting of hyponatremia and uremia -Patient with negative CT scan of the head to suggest acute neurologic deficit. -Prior to admission patient was talkative and able to participate in rehabilitation. -Patient was found with a sodium of 128, BUN 103 and acute on chronic renal failure. -Will continue fluid resuscitation, follow nephrology service recommendations. -Close monitoring of electrolytes and renal function. -Palliative care service has been requested -Patient with a 36 hours and no significant improvement. -Continue minimizing/avoiding nephrotoxic agents.  2-atrial fibrillation -With some rebound tachycardia after being unable to take medication by mouth-we will start IV Lopressor -Continue to follow electrolytes -DVT prophylaxis with heparin has been ordered. -Holding oral meds, including Eliquis.  3-acute on chronic renal failure -Patient with stage IIIb at baseline -Most recent creatinine around 1.3; on presentation 4.6 -After IV fluids creatinine started to trend down some, 4.5>> repeat labs from today  still pending. -Renal ultrasound without obstructive uropathy -Patient develop urinary retention appreciated on bladder scan and Foley catheter has been placed.  4-metabolic acidosis -In the setting of renal failure -Unable to take medication by mouth; bicarb improved after IV resuscitation -Will follow trend and provide further IV bicarb as needed. -Appreciate nephrology service directions and recommendations.  5-anemia of chronic kidney disease -No signs of active bleeding -Hemoglobin stable -Continue to monitor trend.  6-HTN -stable -will continue follow VS -Patient will be started on IV Lopressor.  7-hyponatremia -in the setting of dehydration and use of diuretics -continue holding diuretics -continue IVF's -Na trending up appropriately. -Follow electrolytes trend.  8-hypothyroidism -Continue Synthroid; given IV and current situation.  9-DNR/DNI -Palliative care service has been consulted for further advanced directives -Continue current management -Prognosis guarded.  26-ZTIWPYK diastolic HF -Stable and compensated currently-continue judicious IV fluid resuscitation -Follow daily weights and strict I's and O's.  DVT prophylaxis: Apixaban Code Status: DNR/DNI Family Communication: brother in law updated over the phone.  Disposition Plan: Patient with some slight improvement in her urine output, creatinine and sodium level; BUN has remained at 103.  Still somnolent/lethargic encephalopathic.  Not a good candidate for hemodialysis as per nephrology service recommendation.  Given uncertainty, guarded prognosis and poor outcome, palliative care has been consulted.  Consultants:   Nephrology service  Palliative care  Procedures:  Renal ultrasound: No obstructive uropathy appreciated.  Antimicrobials:  Anti-infectives (From admission, onward)   None      Subjective: Remains to be significant somnolent and lethargic; not following commands unable to take  medication by mouth.  Patient is afebrile.  Objective: Vitals:   07/23/19 0500 07/23/19 0520 07/23/19 0600 07/23/19 1345  BP:  136/81  116/80  Pulse:  (!) 101  98  Resp:   16 20  Temp:  98.9 F (37.2 C)  98.1 F (36.7 C)  TempSrc:  Oral    SpO2:  92% 93% 92%  Weight: 77.9 kg     Height:        Intake/Output Summary (Last 24 hours) at 07/23/2019 1621 Last data filed at 07/23/2019 0500 Gross per 24 hour  Intake 665 ml  Output 1000 ml  Net -335 ml   Filed Weights   07/20/19 2152 07/22/19 0300 07/23/19 0500  Weight: 77.5 kg 75 kg 77.9 kg    Examination: General exam: Still very somnolent and lethargic; patient will arouse to touch and voice commands but is not engaging in conversations or answering questions.  Afebrile; some improvement in her dry lips and oral mucosa. Respiratory system: Clear to auscultation. Respiratory effort normal. Cardiovascular system:RRR. No murmurs, rubs, gallops. Gastrointestinal system: Abdomen is nondistended, soft and nontender. No organomegaly or masses felt. Normal bowel sounds heard. Central nervous system: Unable to fully assess with ongoing lethargy state; patient opening her eyes to voice command and touch. Extremities: No C/C/E Skin: No rashes, lesions or ulcers Psychiatry: Unable to fully assess with ongoing current lethargic state.   Data Reviewed: I have personally reviewed following labs and imaging studies  CBC: Recent Labs  Lab 07/20/19 2153 07/21/19 0535  WBC 9.3 9.0  NEUTROABS 7.3  --   HGB 10.3* 10.2*  HCT 31.6* 32.3*  MCV 91.3 92.6  PLT 265 151   Basic Metabolic Panel: Recent Labs  Lab 07/20/19 2153 07/21/19 0535 07/22/19 0545  NA 128* 129* 134*  K 4.5 4.3 3.6  CL 92* 94* 91*  CO2 17* 16* 26  GLUCOSE 125* 120* 194*  BUN 96* 99* 103*  CREATININE 4.54* 4.66* 4.58*  CALCIUM 8.7* 8.6* 8.0*   GFR: Estimated Creatinine Clearance: 9.6 mL/min (A) (by C-G formula based on SCr of 4.58 mg/dL (H)).   Liver  Function Tests: Recent Labs  Lab 07/20/19 2153 07/21/19 0535  AST 41 42*  ALT 37 36  ALKPHOS 90 87  BILITOT 0.7 0.9  PROT 6.8 7.0  ALBUMIN 3.1* 3.1*   Coagulation Profile: Recent Labs  Lab 07/20/19 2153  INR 2.3*   Urine analysis:    Component Value Date/Time   COLORURINE AMBER (A) 07/21/2019 1334   APPEARANCEUR HAZY (A) 07/21/2019 1334   APPEARANCEUR Cloudy (A) 01/25/2019 1425   LABSPEC 1.017 07/21/2019 1334   PHURINE 5.0 07/21/2019 1334   GLUCOSEU NEGATIVE 07/21/2019 1334   HGBUR NEGATIVE 07/21/2019 [place] 07/21/2019 1334   BILIRUBINUR Negative 01/25/2019 [place] 07/21/2019 1334   PROTEINUR 30 (A) 07/21/2019 1334   UROBILINOGEN negative 09/06/2015 1155   UROBILINOGEN 0.2 03/25/2014 1359   NITRITE NEGATIVE 07/21/2019 1334   LEUKOCYTESUR NEGATIVE 07/21/2019 1334    Recent Results (from the past 240 hour(s))  SARS CORONAVIRUS 2 (TAT 6-24 HRS)     Status: None   Collection Time: 07/17/19  9:28 PM  Result Value Ref Range Status   SARS Coronavirus 2 NEGATIVE NEGATIVE Final    Comment: (NOTE) SARS-CoV-2 target nucleic acids are NOT DETECTED. The SARS-CoV-2 RNA is generally detectable in upper and lower respiratory specimens during the acute phase of infection. Negative results do not preclude SARS-CoV-2 infection, do not rule out co-infections with other pathogens, and should not be used as the sole basis for treatment or other patient management decisions. Negative results must be combined with clinical observations, patient history,  and epidemiological information. The expected result is Negative. Fact Sheet for Patients: SugarRoll.be Fact Sheet for Healthcare Providers: https://www.woods-mathews.com/ This test is not yet approved or cleared by the [place] FDA and  has been authorized for detection and/or diagnosis of SARS-CoV-2 by FDA under an Emergency Use Authorization  (EUA). This EUA will remain  in effect (meaning this test can be used) for the duration of the COVID-19 declaration under Section 56 4(b)(1) of the Act, 21 U.S.C. section 360bbb-3(b)(1), unless the authorization is terminated or revoked sooner. Performed at [facility], [place] [address] [place], [place] [address]   Respiratory Panel by RT PCR (Flu A&B, Covid) -     Status: None   Collection Time: 07/19/19 11:15 AM  Result Value Ref Range Status   SARS Coronavirus 2 by RT PCR NEGATIVE NEGATIVE Final    Comment: (NOTE) SARS-CoV-2 target nucleic acids are NOT DETECTED. The SARS-CoV-2 RNA is generally detectable in upper respiratoy specimens during the acute phase of infection. The lowest concentration of SARS-CoV-2 viral copies this assay can detect is 131 copies/mL. A negative result does not preclude SARS-Cov-2 infection and should not be used as the sole basis for treatment or other patient management decisions. A negative result may occur with  improper specimen collection/handling, submission of specimen other than nasopharyngeal swab, presence of viral mutation(s) within the areas targeted by this assay, and inadequate number of viral copies (<131 copies/mL). A negative result must be combined with clinical observations, patient history, and epidemiological information. The expected result is Negative. Fact Sheet for Patients:  PinkCheek.be Fact Sheet for Healthcare Providers:  GravelBags.it This test is not yet ap proved or cleared by the [place] FDA and  has been authorized for detection and/or diagnosis of SARS-CoV-2 by FDA under an Emergency Use Authorization (EUA). This EUA will remain  in effect (meaning this test can be used) for the duration of the COVID-19 declaration under Section 564(b)(1) of the Act, 21 U.S.C. section 360bbb-3(b)(1), unless the authorization is terminated or revoked sooner.     Influenza A by PCR NEGATIVE NEGATIVE Final   Influenza B by PCR NEGATIVE NEGATIVE Final    Comment: (NOTE) The Xpert Xpress SARS-CoV-2/FLU/RSV assay is intended as an aid in  the diagnosis of influenza from Nasopharyngeal swab specimens and  should not be used as a sole basis for treatment. Nasal washings and  aspirates are unacceptable for Xpert Xpress SARS-CoV-2/FLU/RSV  testing. Fact Sheet for Patients: PinkCheek.be Fact Sheet for Healthcare Providers: GravelBags.it This test is not yet approved or cleared by the [place] FDA and  has been authorized for detection and/or diagnosis of SARS-CoV-2 by  FDA under an Emergency Use Authorization (EUA). This EUA will remain  in effect (meaning this test can be used) for the duration of the  Covid-19 declaration under Section 564(b)(1) of the Act, 21  U.S.C. section 360bbb-3(b)(1), unless the authorization is  terminated or revoked. Performed at [facility], [address], [place], [place] [address]   MRSA PCR Screening     Status: None   Collection Time: 07/21/19  5:59 AM   Specimen: Nasal Mucosa; Nasopharyngeal  Result Value Ref Range Status   MRSA by PCR NEGATIVE NEGATIVE Final    Comment:        The GeneXpert MRSA Assay (FDA approved for NASAL specimens only), is one component of a comprehensive MRSA colonization surveillance program. It is not intended to diagnose MRSA infection nor to guide or monitor treatment for  MRSA infections. Performed at [facility], [address], [place], [place] [address]   Culture, Urine     Status: None   Collection Time: 07/21/19  1:34 PM   Specimen: Urine, Clean Catch  Result Value Ref Range Status   Specimen Description   Final    URINE, CLEAN CATCH Performed at [facility], [address] [place], [place] [address]    Special Requests   Final    NONE Performed at [facility], [address] [place], [place] [address]     Culture   Final    NO GROWTH Performed at [facility], [place] [address] [place], [place] [address]    Report Status 07/23/2019 FINAL  Final  Culture, blood (routine x 2)     Status: None (Preliminary result)   Collection Time: 07/21/19  2:07 PM   Specimen: BLOOD RIGHT HAND  Result Value Ref Range Status   Specimen Description BLOOD RIGHT HAND  Final   Special Requests   Final    BOTTLES DRAWN AEROBIC AND ANAEROBIC Blood Culture adequate volume   Culture   Final    NO GROWTH 2 DAYS Performed at [facility], [address], [place], [place] [address]    Report Status PENDING  Incomplete  Culture, blood (routine x 2)     Status: None (Preliminary result)   Collection Time: 07/21/19  2:07 PM   Specimen: BLOOD LEFT ARM  Result Value Ref Range Status   Specimen Description BLOOD LEFT ARM  Final   Special Requests   Final    BOTTLES DRAWN AEROBIC AND ANAEROBIC Blood Culture adequate volume   Culture   Final    NO GROWTH 2 DAYS Performed at [facility], [address] [place], [place] [address]    Report Status PENDING  Incomplete     Radiology Studies: No results found.  Scheduled Meds: . Chlorhexidine Gluconate Cloth  6 each Topical Daily  . heparin injection (subcutaneous)  5,000 Units Subcutaneous Q8H  . [START ON 07/24/2019] levothyroxine  25 mcg Intravenous Q0600  . metoprolol tartrate  5 mg Intravenous Q8H   Continuous Infusions: . sodium chloride       LOS: 2 days    Time spent: 30 minutes.  Barton Dubois, MD [facility] Pager [phone]   07/23/2019, 4:21 PM

## 2019-07-24 LAB — RENAL FUNCTION PANEL
Albumin: 2.5 g/dL — ABNORMAL LOW (ref 3.5–5.0)
Anion gap: 16 — ABNORMAL HIGH (ref 5–15)
BUN: 106 mg/dL — ABNORMAL HIGH (ref 8–23)
CO2: 22 mmol/L (ref 22–32)
Calcium: 8.1 mg/dL — ABNORMAL LOW (ref 8.9–10.3)
Chloride: 104 mmol/L (ref 98–111)
Creatinine, Ser: 3.36 mg/dL — ABNORMAL HIGH (ref 0.44–1.00)
GFR calc Af Amer: 14 mL/min — ABNORMAL LOW (ref >60)
GFR calc non Af Amer: 12 mL/min — ABNORMAL LOW (ref >60)
Glucose, Bld: 112 mg/dL — ABNORMAL HIGH (ref 70–99)
Phosphorus: 5.8 mg/dL — ABNORMAL HIGH (ref 2.5–4.6)
Potassium: 3.4 mmol/L — ABNORMAL LOW (ref 3.5–5.1)
Sodium: 142 mmol/L (ref 135–145)

## 2019-07-24 MED ORDER — KCL IN DEXTROSE-NACL 10-5-0.45 MEQ/L-%-% IV SOLN
INTRAVENOUS | Status: DC
  Administered 2019-07-24 – 2019-07-25 (×4): via INTRAVENOUS
  Filled 2019-07-24 (×7): qty 1000

## 2019-07-24 NOTE — Progress Notes (Addendum)
Patient ID: Tamara Stein, female   DOB: 05-Sep-1940, 78 y.o.   MRN: [id number] [facility] Progress Note   Assessment/ Plan:   1. Acute kidney Injury on chronic kidney disease stage III: Baseline creatinine around 1.3.  Likely hemodynamically mediated acute kidney injury that appears to be slowly improving with intravenous fluids however with persistence of azotemia.  Per patient's [facility] POA (brother-in-law) she has made the decision in the past not to pursue any forms of renal replacement therapy.  Given her baseline functional status, this appears to be appropriate.  We will continue supportive management with adjustment of fluids as we await any meaningful recovery in her encephalopathy versus transition to hospice. 2.  Hyponatremia: Consistent with hypovolemic hyponatremia that improved with normal saline.  Currently within normal range and with limited oral intake for which I will switch her fluids over to D5/half-normal with KCl.  Rate of correction does not explain current encephalopathy. 3.  Altered level of consciousness: Likely metabolic encephalopathy from a combination of hyponatremia and acute kidney injury/azotemia.  Continue efforts at supportive management and reevaluate response to decide on trajectory of care. 4.  Atrial fibrillation: With low-grade intermittent tachycardia on diltiazem and apixaban.  Continue permissible medications/parenteral conversion.  Subjective:   Without acute events overnight, no improvement of responsiveness/level of consciousness.   Objective:   BP 109/81 (BP Location: Left Arm)   Pulse 94   Temp 98.7 F (37.1 C) (Oral)   Resp 20   Ht 5' 2"  (1.575 m)   Wt 78.8 kg   SpO2 100%   BMI 31.77 kg/m   Intake/Output Summary (Last 24 hours) at 07/24/2019 1232 Last data filed at 07/24/2019 0600 Gross per 24 hour  Intake 941.25 ml  Output 500 ml  Net 441.25 ml   Weight change: 0.9 kg  Physical Exam: Gen: Appears comfortable resting in  bed, awake, tracks around room but unresponsive to questions. CVS: Heart rate irregularly irregular, normal rate.  S1 and S2 normal. Resp: Coarse/transmitted breath sounds bilaterally without distinct rales or rhonchi.  Mouth breathing Abd: Soft, obese, nontender Ext: Flaccid lower extremities with no obvious pitting edema  Imaging: No results found.  Labs: BMET Recent Labs  Lab 07/20/19 2153 07/21/19 0535 07/22/19 0545 07/23/19 1808 07/24/19 0650  NA 128* 129* 134* 139 142  K 4.5 4.3 3.6 3.4* 3.4*  CL 92* 94* 91* 102 104  CO2 17* 16* 26 22 22   GLUCOSE 125* 120* 194* 135* 112*  BUN 96* 99* 103* 116* 106*  CREATININE 4.54* 4.66* 4.58* 3.69* 3.36*  CALCIUM 8.7* 8.6* 8.0* 7.9* 8.1*  PHOS  --   --   --   --  5.8*   CBC Recent Labs  Lab 07/20/19 2153 07/21/19 0535  WBC 9.3 9.0  NEUTROABS 7.3  --   HGB 10.3* 10.2*  HCT 31.6* 32.3*  MCV 91.3 92.6  PLT 265 268    Medications:    . Chlorhexidine Gluconate Cloth  6 each Topical Daily  . heparin injection (subcutaneous)  5,000 Units Subcutaneous Q8H  . levothyroxine  25 mcg Intravenous Q0600  . metoprolol tartrate  2.5 mg Intravenous Q8H   Elmarie Shiley, MD 07/24/2019, 12:32 PM

## 2019-07-24 NOTE — Progress Notes (Signed)
PROGRESS NOTE    Tamara Stein  KDT:[id number] DOB: 1940-11-11 DOA: 07/20/2019 PCP: Janora Norlander, DO     Brief Narrative:  As per H&P written by Dr. Darrick Meigs on 07/21/2019 78 y.o. female, with history of spinal cerebellar degeneration, hypertension, atrial fibrillation, on anticoagulation with Eliquis, CKD stage III, anxiety, hypothyroidism, hyperlipidemia was brought to ED from skilled facility with altered mental status.  Patient was last seen normal this morning around 9 AM and was found to be abnormal around 3 PM.  Patient was sent to the ED for further evaluation.  In the ED patient was found to be in acute kidney injury with creatinine 4.54, baseline creatinine 1.39 as of 07/11/2019. Patient is lethargic and unable to provide significant history.  Though she denies chest pain or shortness of breath.  Assessment & Plan: 1-Acute metabolic encephalopathy -In the setting of hyponatremia and uremia -Patient with negative CT scan of the head to suggest acute neurologic deficit. -Prior to admission patient was talkative and able to participate in rehabilitation. -Patient was found with a sodium of 128, BUN 103 and acute on chronic renal failure. -Will continue fluid resuscitation (rate and type of IV fluids adjusted by nephrology service). Follow nephrology service recommendations. -Close monitoring of electrolytes and renal function. -Palliative care service has been requested -Patient with a 48 hours and no significant improvement. -Continue minimizing/avoiding nephrotoxic agents.  2-atrial fibrillation -With some rebound tachycardia after being unable to take medication by mouth-we will start IV Lopressor -Continue to follow electrolytes -DVT prophylaxis with heparin has been ordered. -Holding oral meds, including Eliquis.  3-acute on chronic renal failure -Patient with stage IIIb at baseline -Most recent creatinine around 1.3; on presentation 4.6 -After IV fluids  creatinine started to trend down some, 4.5>> 3.69>>3.36 today  -BUN is still elevated. -Renal ultrasound without obstructive uropathy -Patient develop urinary retention appreciated on bladder scan and Foley catheter has been placed.  4-metabolic acidosis -In the setting of renal failure -Unable to take medication by mouth; bicarb improved after IV resuscitation -Will follow trend and provide further IV bicarb as needed. -Appreciate nephrology service directions and recommendations.  5-anemia of chronic kidney disease -No signs of active bleeding -Hemoglobin stable -Continue to monitor trend.  6-HTN -stable -will continue follow VS -Patient will be started on IV Lopressor.  7-hyponatremia -in the setting of dehydration and use of diuretics -continue holding diuretics -continue IVF's -Na trending up appropriately. -Follow electrolytes trend.  8-hypothyroidism -Continue Synthroid; given IV and current situation.  9-DNR/DNI -Palliative care service has been consulted for further advanced directives -Continue current management -Prognosis guarded.  99-IPJASNK diastolic HF -Stable and compensated currently-continue judicious IV fluid resuscitation -Follow daily weights and strict I's and O's.  DVT prophylaxis: Apixaban Code Status: DNR/DNI Family Communication: brother in law updated over the phone.  Disposition Plan: Patient with some slight improvement in her urine output, creatinine and sodium level; BUN has remained at 103.  Still somnolent/lethargic encephalopathic.  Not a good candidate for hemodialysis as per nephrology service recommendation.  Given uncertain, guarded prognosis and poor outcome, palliative care has been consulted.  Consultants:   Nephrology service  Palliative care  Procedures:  Renal ultrasound: No obstructive uropathy appreciated.  Antimicrobials:  Anti-infectives (From admission, onward)   None      Subjective: Patient remains  lethargic and intermittently somnolent.  Nonverbal, no eating, no drinking, no fever.  Appears to be comfortable.  Objective: Vitals:   07/23/19 1345 07/23/19 2112 07/24/19 0500 07/24/19 0533  BP:  116/80 116/75  109/81  Pulse: 98 95  94  Resp: 20 20  20   Temp: 98.1 F (36.7 C) 98.6 F (37 C)  98.7 F (37.1 C)  TempSrc:  Oral  Oral  SpO2: 92% 96%  100%  Weight:   78.8 kg   Height:        Intake/Output Summary (Last 24 hours) at 07/24/2019 1421 Last data filed at 07/24/2019 0600 Gross per 24 hour  Intake 941.25 ml  Output 500 ml  Net 441.25 ml   Filed Weights   07/22/19 0300 07/23/19 0500 07/24/19 0500  Weight: 75 kg 77.9 kg 78.8 kg    Examination: General exam: Appears comfortable overall; awake to voice and touch her commands; tracks around the room but is non verbal. Respiratory system: Positive scattered rhonchi right, no wheezing, no crackles.  Good oxygen saturation on room air.  No using accessory muscles. Cardiovascular system: Rate controlled; irregular breathing.  Unable to properly assess JVD with body habitus.  No rubs or gallops. Gastrointestinal system: Abdomen is obese, nondistended, soft and nontender. No organomegaly or masses felt. Normal bowel sounds heard. Central nervous system: Awake and able to intermittently track; nonverbal.  Moving 4 limbs spontaneously. Extremities: No cyanosis or clubbing; no edema appreciated. Skin: No petechiae Psychiatry: Unable to properly assess secondary to current lethargic state and lack of verbal responses.   Data Reviewed: I have personally reviewed following labs and imaging studies  CBC: Recent Labs  Lab 07/20/19 2153 07/21/19 0535  WBC 9.3 9.0  NEUTROABS 7.3  --   HGB 10.3* 10.2*  HCT 31.6* 32.3*  MCV 91.3 92.6  PLT 265 768   Basic Metabolic Panel: Recent Labs  Lab 07/20/19 2153 07/21/19 0535 07/22/19 0545 07/23/19 1808 07/24/19 0650  NA 128* 129* 134* 139 142  K 4.5 4.3 3.6 3.4* 3.4*  CL 92* 94*  91* 102 104  CO2 17* 16* 26 22 22   GLUCOSE 125* 120* 194* 135* 112*  BUN 96* 99* 103* 116* 106*  CREATININE 4.54* 4.66* 4.58* 3.69* 3.36*  CALCIUM 8.7* 8.6* 8.0* 7.9* 8.1*  PHOS  --   --   --   --  5.8*   GFR: Estimated Creatinine Clearance: 13.2 mL/min (A) (by C-G formula based on SCr of 3.36 mg/dL (H)).   Liver Function Tests: Recent Labs  Lab 07/20/19 2153 07/21/19 0535 07/24/19 0650  AST 41 42*  --   ALT 37 36  --   ALKPHOS 90 87  --   BILITOT 0.7 0.9  --   PROT 6.8 7.0  --   ALBUMIN 3.1* 3.1* 2.5*   Coagulation Profile: Recent Labs  Lab 07/20/19 2153  INR 2.3*   Urine analysis:    Component Value Date/Time   COLORURINE AMBER (A) 07/21/2019 1334   APPEARANCEUR HAZY (A) 07/21/2019 1334   APPEARANCEUR Cloudy (A) 01/25/2019 1425   LABSPEC 1.017 07/21/2019 1334   PHURINE 5.0 07/21/2019 1334   GLUCOSEU NEGATIVE 07/21/2019 1334   [place] 07/21/2019 1334   [place] 07/21/2019 1334   BILIRUBINUR Negative 01/25/2019 [place] 07/21/2019 1334   PROTEINUR 30 (A) 07/21/2019 1334   UROBILINOGEN negative 09/06/2015 1155   UROBILINOGEN 0.2 03/25/2014 1359   NITRITE NEGATIVE 07/21/2019 1334   LEUKOCYTESUR NEGATIVE 07/21/2019 1334    Recent Results (from the past 240 hour(s))  SARS CORONAVIRUS 2 (TAT 6-24 HRS)     Status: None   Collection Time: 07/17/19  9:28 PM  Result Value Ref  Range Status   SARS Coronavirus 2 NEGATIVE NEGATIVE Final    Comment: (NOTE) SARS-CoV-2 target nucleic acids are NOT DETECTED. The SARS-CoV-2 RNA is generally detectable in upper and lower respiratory specimens during the acute phase of infection. Negative results do not preclude SARS-CoV-2 infection, do not rule out co-infections with other pathogens, and should not be used as the sole basis for treatment or other patient management decisions. Negative results must be combined with clinical observations, patient history, and epidemiological information.  The expected result is Negative. Fact Sheet for Patients: SugarRoll.be Fact Sheet for Healthcare Providers: https://www.woods-mathews.com/ This test is not yet approved or cleared by the [place] FDA and  has been authorized for detection and/or diagnosis of SARS-CoV-2 by FDA under an Emergency Use Authorization (EUA). This EUA will remain  in effect (meaning this test can be used) for the duration of the COVID-19 declaration under Section 56 4(b)(1) of the Act, 21 U.S.C. section 360bbb-3(b)(1), unless the authorization is terminated or revoked sooner. Performed at [facility], [place] [address], [place], [place] [address]   Respiratory Panel by RT PCR (Flu A&B, Covid) -     Status: None   Collection Time: 07/19/19 11:15 AM  Result Value Ref Range Status   SARS Coronavirus 2 by RT PCR NEGATIVE NEGATIVE Final    Comment: (NOTE) SARS-CoV-2 target nucleic acids are NOT DETECTED. The SARS-CoV-2 RNA is generally detectable in upper respiratoy specimens during the acute phase of infection. The lowest concentration of SARS-CoV-2 viral copies this assay can detect is 131 copies/mL. A negative result does not preclude SARS-Cov-2 infection and should not be used as the sole basis for treatment or other patient management decisions. A negative result may occur with  improper specimen collection/handling, submission of specimen other than nasopharyngeal swab, presence of viral mutation(s) within the areas targeted by this assay, and inadequate number of viral copies (<131 copies/mL). A negative result must be combined with clinical observations, patient history, and epidemiological information. The expected result is Negative. Fact Sheet for Patients:  PinkCheek.be Fact Sheet for Healthcare Providers:  GravelBags.it This test is not yet ap proved or cleared by the [place] FDA and    has been authorized for detection and/or diagnosis of SARS-CoV-2 by FDA under an Emergency Use Authorization (EUA). This EUA will remain  in effect (meaning this test can be used) for the duration of the COVID-19 declaration under Section 564(b)(1) of the Act, 21 U.S.C. section 360bbb-3(b)(1), unless the authorization is terminated or revoked sooner.    Influenza A by PCR NEGATIVE NEGATIVE Final   Influenza B by PCR NEGATIVE NEGATIVE Final    Comment: (NOTE) The Xpert Xpress SARS-CoV-2/FLU/RSV assay is intended as an aid in  the diagnosis of influenza from Nasopharyngeal swab specimens and  should not be used as a sole basis for treatment. Nasal washings and  aspirates are unacceptable for Xpert Xpress SARS-CoV-2/FLU/RSV  testing. Fact Sheet for Patients: PinkCheek.be Fact Sheet for Healthcare Providers: GravelBags.it This test is not yet approved or cleared by the [place] FDA and  has been authorized for detection and/or diagnosis of SARS-CoV-2 by  FDA under an Emergency Use Authorization (EUA). This EUA will remain  in effect (meaning this test can be used) for the duration of the  Covid-19 declaration under Section 564(b)(1) of the Act, 21  U.S.C. section 360bbb-3(b)(1), unless the authorization is  terminated or revoked. Performed at [facility], [address] [place],  [address]   MRSA  PCR Screening     Status: None   Collection Time: 07/21/19  5:59 AM   Specimen: Nasal Mucosa; Nasopharyngeal  Result Value Ref Range Status   MRSA by PCR NEGATIVE NEGATIVE Final    Comment:        The GeneXpert MRSA Assay (FDA approved for NASAL specimens only), is one component of a comprehensive MRSA colonization surveillance program. It is not intended to diagnose MRSA infection nor to guide or monitor treatment for MRSA infections. Performed at [facility], [address], [place], [place] [address]    Culture, Urine     Status: None   Collection Time: 07/21/19  1:34 PM   Specimen: Urine, Clean Catch  Result Value Ref Range Status   Specimen Description   Final    URINE, CLEAN CATCH Performed at [facility], [address], [place], [place] [address]    Special Requests   Final    NONE Performed at [facility], [address], [place], [place] [address]    Culture   Final    NO GROWTH Performed at [facility], [place] [address], [place], [place] [address]    Report Status 07/23/2019 FINAL  Final  Culture, blood (routine x 2)     Status: None (Preliminary result)   Collection Time: 07/21/19  2:07 PM   Specimen: BLOOD RIGHT HAND  Result Value Ref Range Status   Specimen Description BLOOD RIGHT HAND  Final   Special Requests   Final    BOTTLES DRAWN AEROBIC AND ANAEROBIC Blood Culture adequate volume   Culture   Final    NO GROWTH 2 DAYS Performed at [facility], [address], [place], [place] [address]    Report Status PENDING  Incomplete  Culture, blood (routine x 2)     Status: None (Preliminary result)   Collection Time: 07/21/19  2:07 PM   Specimen: BLOOD LEFT ARM  Result Value Ref Range Status   Specimen Description BLOOD LEFT ARM  Final   Special Requests   Final    BOTTLES DRAWN AEROBIC AND ANAEROBIC Blood Culture adequate volume   Culture   Final    NO GROWTH 2 DAYS Performed at [facility], [address], [place], [place] [address]    Report Status PENDING  Incomplete     Radiology Studies: No results found.  Scheduled Meds: . Chlorhexidine Gluconate Cloth  6 each Topical Daily  . heparin injection (subcutaneous)  5,000 Units Subcutaneous Q8H  . levothyroxine  25 mcg Intravenous Q0600  . metoprolol tartrate  2.5 mg Intravenous Q8H   Continuous Infusions: . dextrose 5 % and 0.45 % NaCl with KCl 10 mEq/L 100 mL/hr at 07/24/19 1417     LOS: 3 days    Time spent: 30 minutes.  Barton Dubois, MD [facility] Pager  [phone]   07/24/2019, 2:21 PM

## 2019-07-25 DIAGNOSIS — R4182 Altered mental status, unspecified: Secondary | ICD-10-CM

## 2019-07-25 DIAGNOSIS — Z66 Do not resuscitate: Secondary | ICD-10-CM | POA: Diagnosis present

## 2019-07-25 DIAGNOSIS — Z515 Encounter for palliative care: Secondary | ICD-10-CM

## 2019-07-25 DIAGNOSIS — G9341 Metabolic encephalopathy: Secondary | ICD-10-CM

## 2019-07-25 DIAGNOSIS — Z7189 Other specified counseling: Secondary | ICD-10-CM

## 2019-07-25 NOTE — Progress Notes (Signed)
Patient ID: Tamara Stein, female   DOB: Apr 29, 1941, 77 y.o.   MRN: [id number] [facility] Progress Note   Assessment/ Plan:   1. Acute kidney Injury on chronic kidney disease stage III: Baseline creatinine around 1.3.  Likely hemodynamically mediated acute kidney injury that appears to be slowly improving with intravenous fluids however with persistence of azotemia.  Per patient's [facility] POA (brother-in-law) she has made the decision in the past not to pursue any forms of renal replacement therapy.  Given her baseline functional status, this appears to be appropriate.  We will continue supportive management with adjustment of fluids as we await any meaningful recovery in her encephalopathy versus transition to hospice.  No labs today, probably would not change management-   Will order labs for tomorrow  2.  Hyponatremia: Consistent with hypovolemic hyponatremia that improved with normal saline.  Currently within normal range and with limited oral intake- now on  D5/half-normal with KCl.  Rate of correction does not explain current encephalopathy. 3.  Altered level of consciousness: Likely metabolic encephalopathy from a combination of hyponatremia and acute kidney injury/azotemia.  However with also baseline deficits.   Continue efforts at supportive management and reevaluate response to decide on trajectory of care. 4.  Atrial fibrillation: With low-grade intermittent tachycardia on diltiazem and apixaban.  Continue permissible medications/parenteral conversion. 5.  Anemia-  Not below a threshold of treatment   Subjective:   Without acute events overnight, opens eyes to stimuli but then just stared, no response and no commands. Low normal UOP - no labs    Objective:   BP 106/73 (BP Location: Left Arm)   Pulse 94   Temp 98.4 F (36.9 C) (Oral)   Resp (!) 22   Ht 5' 2"  (1.575 m)   Wt 78.8 kg   SpO2 97%   BMI 31.77 kg/m   Intake/Output Summary (Last 24 hours) at 07/25/2019  0918 Last data filed at 07/24/2019 1417 Gross per 24 hour  Intake 620 ml  Output 300 ml  Net 320 ml   Weight change:   Physical Exam: Gen: Appears comfortable resting in bed, awake, tracks around room but unresponsive to questions. CVS: Heart rate irregularly irregular, normal rate.  S1 and S2 normal. Resp: Coarse/transmitted breath sounds bilaterally without distinct rales or rhonchi.  Mouth breathing Abd: Soft, obese, nontender Ext: Flaccid lower extremities- obese with some pitting edema  Imaging: No results found.  Labs: BMET Recent Labs  Lab 07/20/19 2153 07/21/19 0535 07/22/19 0545 07/23/19 1808 07/24/19 0650  NA 128* 129* 134* 139 142  K 4.5 4.3 3.6 3.4* 3.4*  CL 92* 94* 91* 102 104  CO2 17* 16* 26 22 22   GLUCOSE 125* 120* 194* 135* 112*  BUN 96* 99* 103* 116* 106*  CREATININE 4.54* 4.66* 4.58* 3.69* 3.36*  CALCIUM 8.7* 8.6* 8.0* 7.9* 8.1*  PHOS  --   --   --   --  5.8*   CBC Recent Labs  Lab 07/20/19 2153 07/21/19 0535  WBC 9.3 9.0  NEUTROABS 7.3  --   HGB 10.3* 10.2*  HCT 31.6* 32.3*  MCV 91.3 92.6  PLT 265 268    Medications:    . Chlorhexidine Gluconate Cloth  6 each Topical Daily  . heparin injection (subcutaneous)  5,000 Units Subcutaneous Q8H  . levothyroxine  25 mcg Intravenous Q0600  . metoprolol tartrate  2.5 mg Intravenous Q8H   Quinn Quam A Shaaron Golliday  07/25/2019, 9:18 AM

## 2019-07-25 NOTE — Consult Note (Signed)
Consultation Note Date: 07/25/2019   Patient Name: Tamara Stein  DOB: 05-03-1941  MRN: [id number]  Age / Sex: 78 y.o., female  PCP: Tamara Norlander, DO Referring Physician: Barton Dubois, MD  Reason for Consultation: Establishing goals of care  HPI/Patient Profile: 78 y.o. female  with past medical history of cerebellar degeneration, HTN, a fib, CKD III, anxiety and depression, HLD, hypothyroidism,  admitted on 07/20/2019 with AMS. Workup reveals acute on chronic kidney injury, metabolic acidosis, metabolic encepholapathy r/t uremia, acidosis, hyponatremia. Patient is not candidate for dialysis. Cr is improving with IV fluids, however, patient is not eating or drinking. Requiring IV lopressor for afib d/t not taking oral medications. She is not communicating. Palliative medicine consulted for [place].   Clinical Assessment and Goals of Care: Reviewed patient's chart including labs, notes and imaging. Received report from Tamara Stein.  Evaluated patient. She was lying in bed, awake. Made eye contact, but did not respond verbally or with head nods to any questions. She did not follow any commands- did not squeeze my hand.  I attempted to call her listed Tamara Stein for further Tamara Stein discussion and Tamara Stein does not appear to be clinically improving with all possible interventions.  Primary Decision Maker HCPOA    SUMMARY OF RECOMMENDATIONS -Continue current care -PMT provider left message with patient's HCPOA- Tamara Stein- requested return call for [place] discussion    Code Status/Advance Care Planning:  DNR  Palliative Prophylaxis:   Delirium Protocol  Prognosis:    Unable to determine  Discharge Planning: To Be Determined  Primary Diagnoses: Present on Admission: . AKI (acute kidney injury) ([place])   I have reviewed the medical record, interviewed the patient and family, and  examined the patient. The following aspects are pertinent.  Past Medical History:  Diagnosis Date  . Allergy   . Anxiety   . Cataract   . Cerebellar degeneration ([place])   . Chronic kidney disease   . Chronic knee pain   . Depression   . GERD (gastroesophageal reflux disease)   . Hyperlipidemia   . Hypertension   . Thyroid disease    Social History   Socioeconomic History  . Marital status: Widowed    Spouse name: Not on file  . Number of children: Not on file  . Years of education: Not on file  . Highest education level: Not on file  Occupational History  . Not on file  Tobacco Use  . Smoking status: Never Smoker  . Smokeless tobacco: Never Used  Substance and Sexual Activity  . Alcohol use: No  . Drug use: No  . Sexual activity: Never  Other Topics Concern  . Not on file  Social History Narrative  . Not on file   Social Determinants of Health   Financial Resource Strain:   . Difficulty of Paying Living Expenses: Not on file  Food Insecurity:   . Worried About Charity fundraiser in the Last Year: Not on file  . Ran Out of Food in the  Last Year: Not on file  Transportation Needs:   . Lack of Transportation (Medical): Not on file  . Lack of Transportation (Non-Medical): Not on file  Physical Activity:   . Days of Exercise per Week: Not on file  . Minutes of Exercise per Session: Not on file  Stress:   . Feeling of Stress : Not on file  Social Connections:   . Frequency of Communication with Friends and Family: Not on file  . Frequency of Social Gatherings with Friends and Family: Not on file  . Attends Religious Services: Not on file  . Active Member of Clubs or Organizations: Not on file  . Attends Archivist Meetings: Not on file  . Marital Status: Not on file   Family History  Problem Relation Age of Onset  . Arthritis Sister   . Hyperlipidemia Sister   . Hypertension Sister   . Cancer Brother   . Diabetes Brother   . Heart disease  Brother    Scheduled Meds: . Chlorhexidine Gluconate Cloth  6 each Topical Daily  . heparin injection (subcutaneous)  5,000 Units Subcutaneous Q8H  . levothyroxine  25 mcg Intravenous Q0600  . metoprolol tartrate  2.5 mg Intravenous Q8H   Continuous Infusions: . dextrose 5 % and 0.45 % NaCl with KCl 10 mEq/L 100 mL/hr at 07/25/19 0937   PRN Meds:.acetaminophen **OR** acetaminophen, ondansetron **OR** ondansetron (ZOFRAN) IV Medications Prior to Admission:  Prior to Admission medications   Medication Sig Start Date End Date Taking? Authorizing Provider  acetaminophen (TYLENOL) 325 MG tablet Take 2 tablets (650 mg total) by mouth every 6 (six) hours as needed for mild pain, fever or headache (or Fever >/= 101). 06/05/19  Yes Emokpae, Courage, MD  apixaban (ELIQUIS) 5 MG TABS tablet Take 1 tablet (5 mg total) by mouth 2 (two) times daily. 06/28/19  Yes Kathie Dike, MD  aspirin EC 81 MG tablet Take 1 tablet (81 mg total) by mouth daily with breakfast. 06/05/19  Yes Emokpae, Courage, MD  atorvastatin (LIPITOR) 40 MG tablet Take 1 tablet (40 mg total) by mouth at bedtime. Do not restart until instructed by MD 05/14/19  Yes Tat, Shanon Brow, MD  Calcium Carbonate-Vitamin D (CALTRATE 600+D PO) Take 1 tablet by mouth daily.   Yes [provider]  cetirizine (ZYRTEC) 10 MG chewable tablet Chew 10 mg by mouth daily.   Yes [provider]  diclofenac Sodium (VOLTAREN) 1 % GEL Apply 2g topically to painful joints for arthritis to upper extremities. Max dose is 32g per day Four Times A Day 09:00 AM, 01:00 PM, 05:00 PM, 09:00 PM   Yes [provider]  diltiazem (CARDIZEM CD) 240 MG 24 hr capsule Take 1 capsule (240 mg total) by mouth daily. 06/28/19  Yes Kathie Dike, MD  DULoxetine (CYMBALTA) 30 MG capsule Take 30 mg by mouth daily. Along with 60 mg to = 90 mg   Yes [provider]  DULoxetine (CYMBALTA) 60 MG capsule Take 1 capsule (60 mg total) by mouth daily.  04/27/19  Yes Gottschalk, Leatrice Jewels M, DO  famotidine (PEPCID AC) 10 MG tablet Take 1 tablet (10 mg total) by mouth daily. 06/20/19  Yes Gottschalk, Leatrice Jewels M, DO  furosemide (LASIX) 20 MG tablet Take 1 tablet (20 mg total) by mouth daily. For swelling/Fluid 06/09/19  Yes Emokpae, Courage, MD  hydrALAZINE (APRESOLINE) 50 MG tablet Take 1 tablet (50 mg total) by mouth 3 (three) times daily. For BP 06/05/19  Yes Roxan Hockey, MD  isosorbide mononitrate (IMDUR) 30 MG 24 hr tablet Take 1 tablet (30 mg total) by mouth daily. 06/06/19  Yes Emokpae, Courage, MD  levothyroxine (SYNTHROID) 50 MCG tablet TAKE 1 TABLET ONCE DAILY 04/26/19  Yes Ronnie Doss M, DO  meclizine (ANTIVERT) 25 MG tablet Take 25 mg by mouth 3 (three) times daily as needed for dizziness.   Yes [provider]  Multiple Vitamin (MULTIVITAMIN WITH MINERALS) TABS tablet Take 1 tablet by mouth daily.   Yes [provider]  omeprazole (PRILOSEC) 40 MG capsule TAKE 1 CAPSULE DAILY 04/26/19  Yes Ronnie Doss M, DO  raloxifene (EVISTA) 60 MG tablet Take 1 tablet (60 mg total) by mouth daily. 04/27/19  Yes Gottschalk, Ashly M, DO  NON FORMULARY Diet Change: Dysphagia 3 (chopped), continue thin liquids (moisten meats with gravy)    [provider]  atorvastatin (LIPITOR) 40 MG tablet Take 1 tablet (40 mg total) by mouth at bedtime. 11/05/18   Tamara Norlander, DO  DULoxetine (CYMBALTA) 60 MG capsule Take 1 capsule (60 mg total) by mouth daily. 11/05/18   Tamara Norlander, DO   Allergies  Allergen Reactions  . Sulfa Antibiotics Anaphylaxis    Facial swelling   . Codeine Other (See Comments)    BLACK OUTS  . Doxycycline Nausea Only  . Erythromycin Swelling  . Ibuprofen Other (See Comments)    Stomach pain, muscle spasm, GI bleeding  . Levaquin [Levofloxacin] Other (See Comments)    Insomnia/trouble breathing  . Penicillins Diarrhea    Has patient had a PCN reaction causing immediate rash,  facial/tongue/throat swelling, SOB or lightheadedness with hypotension: No Has patient had a PCN reaction causing severe rash involving mucus membranes or skin necrosis: No Has patient had a PCN reaction that required hospitalization Unknown Has patient had a PCN reaction occurring within the last 10 years: No If all of the above answers are "NO", then may proceed with Cephalosporin use.    Review of Systems  Unable to perform ROS: Mental status change    Physical Exam Vitals and nursing note reviewed.  Constitutional:      General: She is not in acute distress.    Appearance: She is ill-appearing.  Cardiovascular:     Comments: Some anasarca Neurological:     Comments: Does not respond to questions or commands     Vital Signs: BP 133/82 (BP Location: Left Wrist)   Pulse 91   Temp 98 F (36.7 C) (Oral)   Resp 18   Ht 5' 2"  (1.575 m)   Wt 78.8 kg   SpO2 98%   BMI 31.77 kg/m  Pain Scale: PAINAD   Pain Score: 0-No pain   SpO2: SpO2: 98 % O2 Device:SpO2: 98 % O2 Flow Rate: .   IO: Intake/output summary: No intake or output data in the 24 hours ending 07/25/19 1536  LBM: Last BM Date: 07/23/19 Baseline Weight: Weight: 77.5 kg Most recent weight: Weight: 78.8 kg     Palliative Assessment/Data: PPS: 10%     Thank you for this consult. Palliative medicine will continue to follow and assist as needed.   Time In: 1440 Time Out: 1545 Time Total: 65 mins Greater than 50%  of this time was spent counseling and coordinating care related to the above assessment and plan.  Signed by: Mariana Kaufman, AGNP-C Palliative Medicine    Please contact Palliative Medicine Team phone at [phone] for questions and concerns.  For individual provider: See Shea Evans

## 2019-07-25 NOTE — Care Management Important Message (Signed)
Important Message  Patient Details  Name: Tamara Stein MRN: [id number] Date of Birth: 07-Jan-1941   Medicare Important Message Given:  Yes     [name] 07/25/2019, 2:21 PM

## 2019-07-25 NOTE — Progress Notes (Signed)
PROGRESS NOTE    Tamara Stein  XIP:[id number] DOB: July 21, 1941 DOA: 07/20/2019 PCP: Janora Norlander, DO     Brief Narrative:  As per H&P written by Dr. Darrick Meigs on 07/21/2019 78 y.o. female, with history of spinal cerebellar degeneration, hypertension, atrial fibrillation, on anticoagulation with Eliquis, CKD stage III, anxiety, hypothyroidism, hyperlipidemia was brought to ED from skilled facility with altered mental status.  Patient was last seen normal this morning around 9 AM and was found to be abnormal around 3 PM.  Patient was sent to the ED for further evaluation.  In the ED patient was found to be in acute kidney injury with creatinine 4.54, baseline creatinine 1.39 as of 07/11/2019. Patient is lethargic and unable to provide significant history.  Though she denies chest pain or shortness of breath.  Assessment & Plan: 1-Acute metabolic encephalopathy -In the setting of hyponatremia and uremia -Patient with negative CT scan of the head to suggest acute neurologic deficit. -Prior to admission patient was talkative and able to participate in rehabilitation. -Patient was found with a sodium of 128, BUN 103 and acute on chronic renal failure. -Will continue fluid resuscitation (rate and type of IV fluids adjusted by nephrology service). Follow nephrology service recommendations. -Close monitoring of electrolytes and renal function. -Palliative care service has been requested -Patient with a 48 hours and no significant improvement. -Continue minimizing/avoiding nephrotoxic agents.  2-atrial fibrillation -With some rebound tachycardia after being unable to take medication by mouth-we will start IV Lopressor -Continue to follow electrolytes -DVT prophylaxis with heparin has been ordered. -Holding oral meds, including Eliquis.  3-acute on chronic renal failure -Patient with stage IIIb at baseline -Most recent creatinine around 1.3; on presentation 4.6 -After IV fluids  creatinine started to trend down some, 4.5>> 3.69>>3.36 today  -BUN is still elevated. -Renal ultrasound without obstructive uropathy -Patient develop urinary retention appreciated on bladder scan and Foley catheter has been placed.  4-metabolic acidosis -In the setting of renal failure -Unable to take medication by mouth; bicarb improved after IV resuscitation -Will follow trend and provide further IV bicarb as needed. -Appreciate nephrology service directions and recommendations.  5-anemia of chronic kidney disease -No signs of active bleeding -Hemoglobin stable -Continue to monitor trend.  6-HTN -stable -will continue follow VS -Patient will be started on IV Lopressor.  7-hyponatremia -in the setting of dehydration and use of diuretics -continue holding diuretics -continue IVF's -Na trending up appropriately. -Follow electrolytes trend.  8-hypothyroidism -Continue Synthroid; given IV and current situation.  9-DNR/DNI -Palliative care service has been consulted for further advanced directives -Continue current management -Prognosis guarded.  67-HALPFXT diastolic HF -Stable and compensated currently-continue judicious IV fluid resuscitation -Follow daily weights and strict I's and O's.  DVT prophylaxis: Apixaban Code Status: DNR/DNI Family Communication: brother in law updated over the phone.  Disposition Plan: Patient with some slight improvement in her urine output, creatinine and sodium level; BUN has remained at 103.  Still somnolent/lethargic encephalopathic.  Not a good candidate for hemodialysis as per nephrology service recommendation.  Given uncertain, guarded prognosis and poor outcome, palliative care has been consulted.  Consultants:   Nephrology service  Palliative care  Procedures:  Renal ultrasound: No obstructive uropathy appreciated.  Antimicrobials:  Anti-infectives (From admission, onward)   None      Subjective: Remains  lethargic/intermittently somnolent.  Nonverbal, no eating, not drinking, not following commands.  High concerns for ongoing uremia.  Objective: Vitals:   07/24/19 1400 07/24/19 2100 07/25/19 0559 07/25/19 1401  BP: 126/86  127/80 106/73 133/82  Pulse: 98 93 94 91  Resp: 20 20 (!) 22 18  Temp: 99 F (37.2 C) 99 F (37.2 C) 98.4 F (36.9 C) 98 F (36.7 C)  TempSrc: Oral Oral Oral Oral  SpO2: 98% 97% 97% 98%  Weight:      Height:       No intake or output data in the 24 hours ending 07/25/19 1636 Filed Weights   07/22/19 0300 07/23/19 0500 07/24/19 0500  Weight: 75 kg 77.9 kg 78.8 kg    Examination: General exam: Appears comfortable, no fever, awake to voice and touch; but no responding or meaningfully following commands.  No nausea or vomiting.  Patient not eating or drinking.  3.8 kg weight gain since admission. Respiratory system: No wheezing, no frank crackles, no using accessory muscles.  Good O2 sat on room air. Cardiovascular system: Rate controlled; irregular rhythm.  No rubs or gallops.   Gastrointestinal system: Abdomen is nondistended, soft and nontender. No organomegaly or masses felt. Normal bowel sounds heard. Central nervous system: Nonverbal; opening her eyes to verbal cues. Extremities: No cyanosis or clubbing. Skin: No rashes, no petechiae. Psychiatry: Unable to properly assess secondary to ongoing lethargy and lack of verbal response.   Data Reviewed: I have personally reviewed following labs and imaging studies  CBC: Recent Labs  Lab 07/20/19 2153 07/21/19 0535  WBC 9.3 9.0  NEUTROABS 7.3  --   HGB 10.3* 10.2*  HCT 31.6* 32.3*  MCV 91.3 92.6  PLT 265 161   Basic Metabolic Panel: Recent Labs  Lab 07/20/19 2153 07/21/19 0535 07/22/19 0545 07/23/19 1808 07/24/19 0650  NA 128* 129* 134* 139 142  K 4.5 4.3 3.6 3.4* 3.4*  CL 92* 94* 91* 102 104  CO2 17* 16* 26 22 22   GLUCOSE 125* 120* 194* 135* 112*  BUN 96* 99* 103* 116* 106*  CREATININE 4.54*  4.66* 4.58* 3.69* 3.36*  CALCIUM 8.7* 8.6* 8.0* 7.9* 8.1*  PHOS  --   --   --   --  5.8*   GFR: Estimated Creatinine Clearance: 13.2 mL/min (A) (by C-G formula based on SCr of 3.36 mg/dL (H)).   Liver Function Tests: Recent Labs  Lab 07/20/19 2153 07/21/19 0535 07/24/19 0650  AST 41 42*  --   ALT 37 36  --   ALKPHOS 90 87  --   BILITOT 0.7 0.9  --   PROT 6.8 7.0  --   ALBUMIN 3.1* 3.1* 2.5*   Coagulation Profile: Recent Labs  Lab 07/20/19 2153  INR 2.3*   Urine analysis:    Component Value Date/Time   COLORURINE AMBER (A) 07/21/2019 1334   APPEARANCEUR HAZY (A) 07/21/2019 1334   APPEARANCEUR Cloudy (A) 01/25/2019 1425   LABSPEC 1.017 07/21/2019 1334   PHURINE 5.0 07/21/2019 1334   GLUCOSEU NEGATIVE 07/21/2019 1334   [place] 07/21/2019 1334   [place] 07/21/2019 1334   BILIRUBINUR Negative 01/25/2019 [place] 07/21/2019 1334   PROTEINUR 30 (A) 07/21/2019 1334   UROBILINOGEN negative 09/06/2015 1155   UROBILINOGEN 0.2 03/25/2014 1359   NITRITE NEGATIVE 07/21/2019 1334   LEUKOCYTESUR NEGATIVE 07/21/2019 1334    Recent Results (from the past 240 hour(s))  SARS CORONAVIRUS 2 (TAT 6-24 HRS)     Status: None   Collection Time: 07/17/19  9:28 PM  Result Value Ref Range Status   SARS Coronavirus 2 NEGATIVE NEGATIVE Final    Comment: (NOTE) SARS-CoV-2 target nucleic acids are NOT DETECTED.  The SARS-CoV-2 RNA is generally detectable in upper and lower respiratory specimens during the acute phase of infection. Negative results do not preclude SARS-CoV-2 infection, do not rule out co-infections with other pathogens, and should not be used as the sole basis for treatment or other patient management decisions. Negative results must be combined with clinical observations, patient history, and epidemiological information. The expected result is Negative. Fact Sheet for Patients: SugarRoll.be Fact Sheet for  Healthcare Providers: https://www.woods-mathews.com/ This test is not yet approved or cleared by the [place] FDA and  has been authorized for detection and/or diagnosis of SARS-CoV-2 by FDA under an Emergency Use Authorization (EUA). This EUA will remain  in effect (meaning this test can be used) for the duration of the COVID-19 declaration under Section 56 4(b)(1) of the Act, 21 U.S.C. section 360bbb-3(b)(1), unless the authorization is terminated or revoked sooner. Performed at [facility], [place] [address], [place], [place] [address]   Respiratory Panel by RT PCR (Flu A&B, Covid) -     Status: None   Collection Time: 07/19/19 11:15 AM  Result Value Ref Range Status   SARS Coronavirus 2 by RT PCR NEGATIVE NEGATIVE Final    Comment: (NOTE) SARS-CoV-2 target nucleic acids are NOT DETECTED. The SARS-CoV-2 RNA is generally detectable in upper respiratoy specimens during the acute phase of infection. The lowest concentration of SARS-CoV-2 viral copies this assay can detect is 131 copies/mL. A negative result does not preclude SARS-Cov-2 infection and should not be used as the sole basis for treatment or other patient management decisions. A negative result may occur with  improper specimen collection/handling, submission of specimen other than nasopharyngeal swab, presence of viral mutation(s) within the areas targeted by this assay, and inadequate number of viral copies (<131 copies/mL). A negative result must be combined with clinical observations, patient history, and epidemiological information. The expected result is Negative. Fact Sheet for Patients:  PinkCheek.be Fact Sheet for Healthcare Providers:  GravelBags.it This test is not yet ap proved or cleared by the [place] FDA and  has been authorized for detection and/or diagnosis of SARS-CoV-2 by FDA under an Emergency Use Authorization (EUA).  This EUA will remain  in effect (meaning this test can be used) for the duration of the COVID-19 declaration under Section 564(b)(1) of the Act, 21 U.S.C. section 360bbb-3(b)(1), unless the authorization is terminated or revoked sooner.    Influenza A by PCR NEGATIVE NEGATIVE Final   Influenza B by PCR NEGATIVE NEGATIVE Final    Comment: (NOTE) The Xpert Xpress SARS-CoV-2/FLU/RSV assay is intended as an aid in  the diagnosis of influenza from Nasopharyngeal swab specimens and  should not be used as a sole basis for treatment. Nasal washings and  aspirates are unacceptable for Xpert Xpress SARS-CoV-2/FLU/RSV  testing. Fact Sheet for Patients: PinkCheek.be Fact Sheet for Healthcare Providers: GravelBags.it This test is not yet approved or cleared by the [place] FDA and  has been authorized for detection and/or diagnosis of SARS-CoV-2 by  FDA under an Emergency Use Authorization (EUA). This EUA will remain  in effect (meaning this test can be used) for the duration of the  Covid-19 declaration under Section 564(b)(1) of the Act, 21  U.S.C. section 360bbb-3(b)(1), unless the authorization is  terminated or revoked. Performed at [facility], [address], [place],  [address]   MRSA PCR Screening     Status: None   Collection Time: 07/21/19  5:59 AM   Specimen: Nasal Mucosa; Nasopharyngeal  Result Value Ref Range Status   MRSA by PCR NEGATIVE NEGATIVE Final    Comment:        The GeneXpert MRSA Assay (FDA approved for NASAL specimens only), is one component of a comprehensive MRSA colonization surveillance program. It is not intended to diagnose MRSA infection nor to guide or monitor treatment for MRSA infections. Performed at [facility], [address], [place], [place] [address]   Culture, Urine     Status: None   Collection Time: 07/21/19  1:34 PM   Specimen: Urine, Clean Catch  Result Value Ref Range  Status   Specimen Description   Final    URINE, CLEAN CATCH Performed at [facility], [address] [place], [place] [address]    Special Requests   Final    NONE Performed at [facility], [address] [place], [place] [address]    Culture   Final    NO GROWTH Performed at [facility], [place] [address] [place], [place] [address]    Report Status 07/23/2019 FINAL  Final  Culture, blood (routine x 2)     Status: None (Preliminary result)   Collection Time: 07/21/19  2:07 PM   Specimen: BLOOD RIGHT HAND  Result Value Ref Range Status   Specimen Description BLOOD RIGHT HAND  Final   Special Requests   Final    BOTTLES DRAWN AEROBIC AND ANAEROBIC Blood Culture adequate volume   Culture   Final    NO GROWTH 4 DAYS Performed at [facility], [address] [place], [place] [address]    Report Status PENDING  Incomplete  Culture, blood (routine x 2)     Status: None (Preliminary result)   Collection Time: 07/21/19  2:07 PM   Specimen: BLOOD LEFT ARM  Result Value Ref Range Status   Specimen Description BLOOD LEFT ARM  Final   Special Requests   Final    BOTTLES DRAWN AEROBIC AND ANAEROBIC Blood Culture adequate volume   Culture   Final    NO GROWTH 4 DAYS Performed at [facility], [address] [place], [place] [address]    Report Status PENDING  Incomplete     Radiology Studies: No results found.  Scheduled Meds: . Chlorhexidine Gluconate Cloth  6 each Topical Daily  . heparin injection (subcutaneous)  5,000 Units Subcutaneous Q8H  . levothyroxine  25 mcg Intravenous Q0600  . metoprolol tartrate  2.5 mg Intravenous Q8H   Continuous Infusions: . dextrose 5 % and 0.45 % NaCl with KCl 10 mEq/L 100 mL/hr at 07/25/19 0937     LOS: 4 days    Time spent: 30 minutes.  Barton Dubois, MD [facility] Pager [phone]   07/25/2019, 4:36 PM

## 2019-07-26 DIAGNOSIS — E66811 Obesity, class 1: Secondary | ICD-10-CM

## 2019-07-26 DIAGNOSIS — I480 Paroxysmal atrial fibrillation: Secondary | ICD-10-CM

## 2019-07-26 DIAGNOSIS — E669 Obesity, unspecified: Secondary | ICD-10-CM

## 2019-07-26 DIAGNOSIS — E871 Hypo-osmolality and hyponatremia: Secondary | ICD-10-CM

## 2019-07-26 DIAGNOSIS — I5032 Chronic diastolic (congestive) heart failure: Secondary | ICD-10-CM

## 2019-07-26 LAB — CULTURE, BLOOD (ROUTINE X 2)
Culture: NO GROWTH
Culture: NO GROWTH
Special Requests: ADEQUATE
Special Requests: ADEQUATE

## 2019-07-26 LAB — RENAL FUNCTION PANEL
Albumin: 2.3 g/dL — ABNORMAL LOW (ref 3.5–5.0)
Anion gap: 11 (ref 5–15)
BUN: 103 mg/dL — ABNORMAL HIGH (ref 8–23)
CO2: 23 mmol/L (ref 22–32)
Calcium: 8.1 mg/dL — ABNORMAL LOW (ref 8.9–10.3)
Chloride: 106 mmol/L (ref 98–111)
Creatinine, Ser: 2.38 mg/dL — ABNORMAL HIGH (ref 0.44–1.00)
GFR calc Af Amer: 22 mL/min — ABNORMAL LOW (ref >60)
GFR calc non Af Amer: 19 mL/min — ABNORMAL LOW (ref >60)
Glucose, Bld: 139 mg/dL — ABNORMAL HIGH (ref 70–99)
Phosphorus: 4 mg/dL (ref 2.5–4.6)
Potassium: 3.5 mmol/L (ref 3.5–5.1)
Sodium: 140 mmol/L (ref 135–145)

## 2019-07-26 LAB — RESPIRATORY PANEL BY RT PCR (FLU A&B, COVID)
Influenza A by PCR: NEGATIVE
Influenza B by PCR: NEGATIVE
SARS Coronavirus 2 by RT PCR: NEGATIVE

## 2019-07-26 MED ORDER — GLYCOPYRROLATE 1 MG PO TABS: 1.0000 mg | ORAL_TABLET | ORAL | Status: DC | PRN

## 2019-07-26 MED ORDER — HALOPERIDOL 0.5 MG PO TABS: 0.5000 mg | ORAL_TABLET | ORAL | Status: DC | PRN

## 2019-07-26 MED ORDER — ONDANSETRON HCL 4 MG/2ML IJ SOLN: 4.0000 mg | Freq: Four times a day (QID) | INTRAMUSCULAR | Status: DC | PRN

## 2019-07-26 MED ORDER — LORAZEPAM 2 MG/ML IJ SOLN
INTRAMUSCULAR | Status: AC
  Administered 2019-07-26: 2 mg via INTRAVENOUS
  Filled 2019-07-26: qty 1

## 2019-07-26 MED ORDER — ONDANSETRON 4 MG PO TBDP: 4.0000 mg | ORAL_TABLET | Freq: Four times a day (QID) | ORAL | Status: DC | PRN

## 2019-07-26 MED ORDER — GLYCOPYRROLATE 1 MG PO TABS: 1.0000 mg | ORAL_TABLET | ORAL | 0 refills | Status: AC | PRN

## 2019-07-26 MED ORDER — HALOPERIDOL LACTATE 2 MG/ML PO CONC: 0.5000 mg | ORAL | Status: DC | PRN

## 2019-07-26 MED ORDER — GLYCOPYRROLATE 0.2 MG/ML IJ SOLN: 0.2000 mg | INTRAMUSCULAR | Status: DC | PRN

## 2019-07-26 MED ORDER — MORPHINE SULFATE (CONCENTRATE) 5 MG/0.25ML PO SOLN: 5.0000 mg | Freq: Three times a day (TID) | ORAL | 0 refills | Status: AC | PRN

## 2019-07-26 MED ORDER — POLYVINYL ALCOHOL 1.4 % OP SOLN: 1.0000 [drp] | Freq: Four times a day (QID) | OPHTHALMIC | Status: DC | PRN

## 2019-07-26 MED ORDER — HYDROMORPHONE HCL 1 MG/ML IJ SOLN
0.5000 mg | INTRAMUSCULAR | Status: DC | PRN
  Administered 2019-07-26: 0.5 mg via INTRAVENOUS
  Filled 2019-07-26: qty 0.5

## 2019-07-26 MED ORDER — BIOTENE DRY MOUTH MT LIQD: 15.0000 mL | OROMUCOSAL | Status: DC | PRN

## 2019-07-26 MED ORDER — HALOPERIDOL LACTATE 5 MG/ML IJ SOLN: 0.5000 mg | INTRAMUSCULAR | Status: DC | PRN

## 2019-07-26 MED ORDER — ACETAMINOPHEN 325 MG PO TABS: 650.0000 mg | ORAL_TABLET | Freq: Four times a day (QID) | ORAL | Status: DC | PRN

## 2019-07-26 MED ORDER — ACETAMINOPHEN 650 MG RE SUPP: 650.0000 mg | Freq: Four times a day (QID) | RECTAL | Status: DC | PRN

## 2019-07-26 MED ORDER — LORAZEPAM 2 MG/ML IJ SOLN: 1.0000 mg | Freq: Four times a day (QID) | INTRAMUSCULAR | Status: DC | PRN

## 2019-07-26 MED ORDER — ACETAMINOPHEN 325 MG PO TABS: 650.0000 mg | ORAL_TABLET | Freq: Four times a day (QID) | ORAL | Status: AC | PRN

## 2019-07-26 MED ORDER — SODIUM CHLORIDE 0.45 % IV SOLN: INTRAVENOUS | Status: DC

## 2019-07-26 MED ORDER — LORAZEPAM 2 MG/ML IJ SOLN: 2.0000 mg | Freq: Four times a day (QID) | INTRAMUSCULAR | Status: DC

## 2019-07-26 NOTE — TOC Transition Note (Signed)
Transition of Care [facility]) - CM/SW Discharge Note   Patient Details  Name: Tamara Stein MRN: [id number] Date of Birth: 23-Apr-1941  Transition of Care [facility] CM/SW Contact:  [place], LCSW Phone Number: 07/26/2019, 4:34 PM   Clinical Narrative:   Pt will be discharging to [facility] on 07/26/2019 via [facility]. CSW attempted to contact pts emergency contact [facility] [phone] but was unsucess. CSW unable to leave VM.   Number to call for report is [phone].   [place] Transitions of Care  Clinical Social Worker  Ph: [phone]  Final next level of care: [place] Barriers to Discharge: No Barriers Identified   Patient Goals and CMS Choice Patient states their goals for this hospitalization and ongoing recovery are:: for patient to discharge to inpatient hospice for end of life comfort care      Discharge Placement              Patient chooses bed at: [facility]) Patient to be transferred to facility by: [place] Name of family member notified: attempted to contact [facility] [phone], line was busy. Patient and family notified of of transfer: 07/26/19  Discharge Plan and Services                                     Social Determinants of Health (SDOH) Interventions     Readmission Risk Interventions Readmission Risk Prevention Plan 05/07/2019  Post Dischage Appt Complete  Medication Screening Complete  Transportation Screening Complete  Some recent data might be hidden

## 2019-07-26 NOTE — Plan of Care (Signed)
  Problem: Health Behavior/Discharge Planning: Goal: Ability to manage health-related needs will improve Outcome: Adequate for Discharge   Problem: Education: Goal: Knowledge of General Education information will improve Description: Including pain rating scale, medication(s)/side effects and non-pharmacologic comfort measures Outcome: Adequate for Discharge   Problem: Clinical Measurements: Goal: Ability to maintain clinical measurements within normal limits will improve Outcome: Adequate for Discharge Goal: Will remain free from infection Outcome: Adequate for Discharge Goal: Diagnostic test results will improve Outcome: Adequate for Discharge Goal: Respiratory complications will improve Outcome: Adequate for Discharge Goal: Cardiovascular complication will be avoided Outcome: Adequate for Discharge   Problem: Activity: Goal: Risk for activity intolerance will decrease Outcome: Adequate for Discharge   Problem: Nutrition: Goal: Adequate nutrition will be maintained Outcome: Adequate for Discharge   Problem: Elimination: Goal: Will not experience complications related to bowel motility Outcome: Adequate for Discharge Goal: Will not experience complications related to urinary retention Outcome: Adequate for Discharge   Problem: Pain Managment: Goal: General experience of comfort will improve Outcome: Adequate for Discharge   Problem: Safety: Goal: Ability to remain free from injury will improve Outcome: Adequate for Discharge   Problem: Skin Integrity: Goal: Risk for impaired skin integrity will decrease Outcome: Adequate for Discharge

## 2019-07-26 NOTE — Progress Notes (Addendum)
Patient ID: Mira Balon, female   DOB: 29-Sep-1940, 78 y.o.   MRN: [id number] S: No events overnight.  Seen by palliative care yesterday and pt is DNR. O:BP 119/72 (BP Location: Left Wrist)   Pulse 88   Temp 98.6 F (37 C) (Axillary)   Resp 20   Ht 5' 2"  (1.575 m)   Wt 82.8 kg   SpO2 97%   BMI 33.39 kg/m   Intake/Output Summary (Last 24 hours) at 07/26/2019 1136 Last data filed at 07/26/2019 0900 Gross per 24 hour  Intake 658.14 ml  Output 550 ml  Net 108.14 ml   Intake/Output: I/O last 3 completed shifts: In: 658.1 [I.V.:658.1] Out: 550 [Urine:550]  Intake/Output this shift:  No intake/output data recorded. Weight change:  Gen: elderly WF who is delirious and nonverbal CVS: no rub Resp: cta Abd: +BS, soft, NT/ND Ext:  No edema  Recent Labs  Lab 07/20/19 2153 07/21/19 0535 07/22/19 0545 07/23/19 1808 07/24/19 0650 07/26/19 0600  NA 128* 129* 134* 139 142 140  K 4.5 4.3 3.6 3.4* 3.4* 3.5  CL 92* 94* 91* 102 104 106  CO2 17* 16* 26 22 22 23   GLUCOSE 125* 120* 194* 135* 112* 139*  BUN 96* 99* 103* 116* 106* 103*  CREATININE 4.54* 4.66* 4.58* 3.69* 3.36* 2.38*  ALBUMIN 3.1* 3.1*  --   --  2.5* 2.3*  CALCIUM 8.7* 8.6* 8.0* 7.9* 8.1* 8.1*  PHOS  --   --   --   --  5.8* 4.0  AST 41 42*  --   --   --   --   ALT 37 36  --   --   --   --    Liver Function Tests: Recent Labs  Lab 07/20/19 2153 07/21/19 0535 07/24/19 0650 07/26/19 0600  AST 41 42*  --   --   ALT 37 36  --   --   ALKPHOS 90 87  --   --   BILITOT 0.7 0.9  --   --   PROT 6.8 7.0  --   --   ALBUMIN 3.1* 3.1* 2.5* 2.3*   No results for input(s): LIPASE, AMYLASE in the last 168 hours. No results for input(s): AMMONIA in the last 168 hours. CBC: Recent Labs  Lab 07/20/19 2153 07/21/19 0535  WBC 9.3 9.0  NEUTROABS 7.3  --   HGB 10.3* 10.2*  HCT 31.6* 32.3*  MCV 91.3 92.6  PLT 265 268   Cardiac Enzymes: No results for input(s): CKTOTAL, CKMB, CKMBINDEX, TROPONINI in the last 168  hours. CBG: No results for input(s): GLUCAP in the last 168 hours.  Iron Studies: No results for input(s): IRON, TIBC, TRANSFERRIN, FERRITIN in the last 72 hours. Studies/Results: No results found. . metoprolol tartrate  2.5 mg Intravenous Q8H    BMET    Component Value Date/Time   NA 140 07/26/2019 0600   NA 141 05/18/2019 1510   K 3.5 07/26/2019 0600   CL 106 07/26/2019 0600   CO2 23 07/26/2019 0600   GLUCOSE 139 (H) 07/26/2019 0600   BUN 103 (H) 07/26/2019 0600   BUN 38 (H) 05/18/2019 1510   CREATININE 2.38 (H) 07/26/2019 0600   CALCIUM 8.1 (L) 07/26/2019 0600   GFRNONAA 19 (L) 07/26/2019 0600   GFRAA 22 (L) 07/26/2019 0600   CBC    Component Value Date/Time   WBC 9.0 07/21/2019 0535   RBC 3.49 (L) 07/21/2019 0535   HGB 10.2 (L) 07/21/2019 0535  HGB 11.0 (L) 05/18/2019 1510   HCT 32.3 (L) 07/21/2019 0535   HCT 33.3 (L) 05/18/2019 1510   PLT 268 07/21/2019 0535   PLT 276 05/18/2019 1510   MCV 92.6 07/21/2019 0535   MCV 94 05/18/2019 1510   MCH 29.2 07/21/2019 0535   MCHC 31.6 07/21/2019 0535   RDW 14.9 07/21/2019 0535   RDW 13.2 05/18/2019 1510   LYMPHSABS 1.0 07/20/2019 2153   LYMPHSABS 1.6 12/09/2017 1136   MONOABS 0.9 07/20/2019 2153   EOSABS 0.0 07/20/2019 2153   EOSABS 0.4 12/09/2017 1136   BASOSABS 0.0 07/20/2019 2153   BASOSABS 0.1 12/09/2017 1136     Assessment/Plan:  1. AKI/CKD stage 3- presumably hemodynamically mediated due to hypovolemia and hypotension.  Improving with IVF"s.  Not a candidate for dialysis 2. Hyponatremia- hypovolemic improved with IVF's and is now normal 3. Metabolic acidosis- improved 4. AMS- remains encephalopathic despite improving sodium and azotemia. 5. Atrial fibrillation- on dilt and apixaban 6. Anemia- cont to follow 7. Disposition- poor overall prognosis and agree with ongoing discussions with palliative care.  Per Palliative care the HCPOA has decided to transition to comfort care.  Will stop IVF"s and sign off.   Please call with questions or concerns.    Donetta Potts, MD [employer] [phone]

## 2019-07-26 NOTE — Discharge Summary (Signed)
Physician Discharge Summary  Tamara Stein WUJ:[id number] DOB: 25-May-1941 DOA: 07/20/2019  PCP: Janora Norlander, DO  Admit date: 07/20/2019 Discharge date: 07/26/2019  Time spent: 35 minutes  Recommendations for Outpatient Follow-up:  1. Full comfort care.   Discharge Diagnoses:  Active Problems:   Acute renal failure superimposed on stage 3b chronic kidney disease (HCC)   Chronic diastolic HF (heart failure) (HCC)   AKI (acute kidney injury) ([place])   Altered mental status   Metabolic encephalopathy   Goals of care, counseling/discussion   Palliative care by specialist   DNR (do not resuscitate)   Hyponatremia   Class 1 obesity   PAF (paroxysmal atrial fibrillation) ([place])   Discharge Condition: Stable overall and in no acute distress.  Transfer to hospice home for further symptomatic management and end-of-life care.  Diet recommendation: Comfort.  Filed Weights   07/23/19 0500 07/24/19 0500 07/26/19 0500  Weight: 77.9 kg 78.8 kg 82.8 kg    History of present illness:  As per H&P written by Dr. Darrick Meigs on 07/21/2019 78 y.o.female,with history of spinal cerebellar degeneration, hypertension, atrial fibrillation,on anticoagulation with Eliquis, CKD stage III, anxiety, hypothyroidism, hyperlipidemia was brought to ED from skilled facility with altered mental status. Patient was last seen normal this morning around 9 AM and was found to be abnormal around 3 PM. Patient was sent to the ED for further evaluation. In the ED patient was found to be in acute kidney injury with creatinine 4.54, baseline creatinine 1.39 as of 07/11/2019. Patient is lethargic and unable to provide significant history. Though she denies chest pain or shortness of breath.  Hospital Course:  1-Acute metabolic encephalopathy -In the setting of hyponatremia and uremia -Patient with negative CT scan of the head to suggest acute neurologic deficit. -Prior to admission patient was talkative and able  to participate in rehabilitation. -Patient was found with a sodium of 128, BUN 103 and acute on chronic renal failure. -After 72 hours of intensive treatment patient failed to improve and remained to be encephalopathic most likely with concern for uremia. -Not a candidate for hemodialysis -Discussed with HCPOA about goals of cares and advance directive resulted in comfort care management and symptom control only. -Patient will be discharge to hospice home for end-of-life care.  2-Paroxysmal atrial fibrillation -With some rebound tachycardia after being unable to take medication by mouth -Patient was started on IV Lopressor with good response -At this moment decision has been made to pursued comfort care and symptomatic management only -Unable to take medication by mouth. -Beta-blockers, calcium channel blockers and secondary prevention with the use of Eliquis have been discontinued at this moment.  3-acute on chronic renal failure -Patient with stage IIIb at baseline -Most recent creatinine around 1.3; on presentation 4.6 -After IV fluids creatinine started to trend down some, 4.5>> 3.69>>3.36>>2.38 today  -BUN remain elevated -Renal ultrasound without obstructive uropathy -Patient develop urinary retention appreciated on bladder scan and Foley catheter was placed. -Patient not a candidate for hemodialysis -After discussion of goals of care will focus on symptomatic management and comfort only. -Patient will be transferred to hospice home for end-of-life care -Catheter remain in place at discharge to facilitate comfort and protect her skin integrity.  4-metabolic acidosis -In the setting of renal failure -Unable to take medication by mouth; bicarb improved after IV repletion fluids resuscitation -at this time no further blood work will be made; patient has been transitioned to comfort care and symptomatic management only. -Will be transfer to hospice home for end-of-life  care.  5-anemia of chronic kidney disease -No signs of active bleeding -Hemoglobin stable -No transfusion needed during hospitalization.  6-HTN -Unable to take medication by mouth -Transitioning care to comfort and symptomatic management. -No medication will be given for blood pressure control at this time.  7-hyponatremia -in the setting of dehydration oral intake and use of diuretics -Sodium level normalizes after fluid resuscitation and holding diuretics. -Despite stabilization of her electrolytes patient failed to mentally improved. -Plan is to transition to comfort care and symptomatic management only.  8-hypothyroidism -Unable to take oral medications -Synthroid has been discontinued -Follow Comfort care and symptomatic management only.  9-DNR/DNI -Palliative care service has been consulted for further advanced directives -After discussing with healthcare power of attorney and following limitations on the right foot candidate for hemodialysis management decision has remained stable pursued comfort care and symptomatic management. -Patient will be discharged to hospice home for end-of-life care.  24-PYKDXIP diastolic HF -Stable and compensated currently during hospitalization -At this moment plan is to focus on symptomatic management and comfort care -Medication has been discontinued at discharge.  Procedures:  See below for x-ray reports.  Consultations:  Nephrology service  Palliative care  Discharge Exam: Vitals:   07/26/19 0637 07/26/19 1131  BP: 119/72   Pulse: 88   Resp: 20   Temp: 98.6 F (37 C)   SpO2: 97% 96%   General exam: Appears comfortable, no fever, awake to voice and touch; but no responding or meaningfully following commands.  No nausea or vomiting.  Patient not eating or drinking.  3.8 kg weight gain since admission. Respiratory system: No wheezing, no frank crackles, no using accessory muscles.  Good O2 sat on room  air. Cardiovascular system: Rate controlled; irregular rhythm.  No rubs or gallops.   Gastrointestinal system: Abdomen is nondistended, soft and nontender. No organomegaly or masses felt. Normal bowel sounds heard. Central nervous system: Nonverbal; opening her eyes to verbal cues. Extremities: No cyanosis or clubbing. Skin: No rashes, no petechiae. Psychiatry: Unable to properly assess secondary to ongoing lethargy and lack of verbal response.   Discharge Instructions   Discharge Instructions    Discharge instructions   Complete by: As directed    Symptomatic management and comfort care Life expectancy less than 2 weeks. No rehospitalization     Allergies as of 07/26/2019      Reactions   Sulfa Antibiotics Anaphylaxis   Facial swelling    Codeine Other (See Comments)   BLACK OUTS   Doxycycline Nausea Only   Erythromycin Swelling   Ibuprofen Other (See Comments)   Stomach pain, muscle spasm, GI bleeding   Levaquin [levofloxacin] Other (See Comments)   Insomnia/trouble breathing   Penicillins Diarrhea   Has patient had a PCN reaction causing immediate rash, facial/tongue/throat swelling, SOB or lightheadedness with hypotension: No Has patient had a PCN reaction causing severe rash involving mucus membranes or skin necrosis: No Has patient had a PCN reaction that required hospitalization Unknown Has patient had a PCN reaction occurring within the last 10 years: No If all of the above answers are "NO", then may proceed with Cephalosporin use.      Medication List    STOP taking these medications   apixaban 5 MG Tabs tablet Commonly known as: ELIQUIS   aspirin EC 81 MG tablet   atorvastatin 40 MG tablet Commonly known as: LIPITOR   CALTRATE 600+D PO   cetirizine 10 MG chewable tablet Commonly known as: ZYRTEC   diclofenac Sodium 1 % Gel  Commonly known as: VOLTAREN   diltiazem 240 MG 24 hr capsule Commonly known as: CARDIZEM CD   DULoxetine 30 MG  capsule Commonly known as: CYMBALTA   DULoxetine 60 MG capsule Commonly known as: CYMBALTA   famotidine 10 MG tablet Commonly known as: Pepcid AC   furosemide 20 MG tablet Commonly known as: LASIX   hydrALAZINE 50 MG tablet Commonly known as: APRESOLINE   isosorbide mononitrate 30 MG 24 hr tablet Commonly known as: IMDUR   levothyroxine 50 MCG tablet Commonly known as: SYNTHROID   meclizine 25 MG tablet Commonly known as: ANTIVERT   multivitamin with minerals Tabs tablet   NON FORMULARY   omeprazole 40 MG capsule Commonly known as: PRILOSEC   raloxifene 60 MG tablet Commonly known as: EVISTA     TAKE these medications   acetaminophen 325 MG tablet Commonly known as: TYLENOL Take 2 tablets (650 mg total) by mouth every 6 (six) hours as needed for mild pain (or Fever >/= 101). What changed: reasons to take this   glycopyrrolate 1 MG tablet Commonly known as: ROBINUL Take 1 tablet (1 mg total) by mouth every 4 (four) hours as needed (excessive secretions).   Morphine Sulfate (Concentrate) 5 MG/0.25ML Soln Take 5 mg by mouth every 8 (eight) hours as needed.      Allergies  Allergen Reactions  . Sulfa Antibiotics Anaphylaxis    Facial swelling   . Codeine Other (See Comments)    BLACK OUTS  . Doxycycline Nausea Only  . Erythromycin Swelling  . Ibuprofen Other (See Comments)    Stomach pain, muscle spasm, GI bleeding  . Levaquin [Levofloxacin] Other (See Comments)    Insomnia/trouble breathing  . Penicillins Diarrhea    Has patient had a PCN reaction causing immediate rash, facial/tongue/throat swelling, SOB or lightheadedness with hypotension: No Has patient had a PCN reaction causing severe rash involving mucus membranes or skin necrosis: No Has patient had a PCN reaction that required hospitalization Unknown Has patient had a PCN reaction occurring within the last 10 years: No If all of the above answers are "NO", then may proceed with Cephalosporin  use.       The results of significant diagnostics from this hospitalization (including imaging, microbiology, ancillary and laboratory) are listed below for reference.    Significant Diagnostic Studies: CT ABDOMEN PELVIS WO CONTRAST  Result Date: 06/29/2019 CLINICAL DATA:  78 year old female with increasing abdominal pain over the last few days. EXAM: CT ABDOMEN AND PELVIS WITHOUT CONTRAST TECHNIQUE: Multidetector CT imaging of the abdomen and pelvis was performed following the standard protocol without IV contrast. COMPARISON:  CT Abdomen and Pelvis 05/31/2019 and earlier. FINDINGS: Lower chest: Large hiatal hernia again noted. No cardiomegaly or pericardial effusion. But there are new layering bilateral pleural effusions since October, small right greater than left. Associated lung base atelectasis. Hepatobiliary: Negative noncontrast liver. Diminutive or absent gallbladder as before. Pancreas: Stable, negative. 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 Adrenals/Urinary Tract: Normal adrenal glands. Right renal vascular calcifications. No nephrolithiasis. Stable renal collecting systems. Decompressed ureters. Mildly distended but otherwise unremarkable urinary bladder. Stomach/Bowel: Oral contrast mixed with stool in the rectum. Sigmoid diverticulosis without active inflammation today. Increased retained stool at the splenic flexure, which no longer appears inflamed. Contrast mixed with stool from the mid transverse proximally. Redundant hepatic flexure. Negative right colon. No large bowel inflammation. Negative terminal ileum. Diminutive or absent appendix. No dilated small bowel. Large gastric hernia, unremarkable intra-abdominal stomach. Negative duodenum. No  free air, free fluid. Vascular/Lymphatic: Extensive Aortoiliac calcified atherosclerosis. Vascular patency is not evaluated in the absence of IV contrast. Reproductive: Benign-appearing left ovarian cyst  with simple fluid density remain stable. Surgically absent uterus. Diminutive or absent right ovary. Other: No pelvic free fluid. Musculoskeletal: Chronic L4 compression fracture. Chronic right posterior 8th rib fracture. No acute osseous abnormality identified. IMPRESSION: 1. Resolved large bowel inflammation since October with no acute or inflammatory process identified in the noncontrast abdomen or pelvis. 2. New small layering pleural effusions. Chronic large hiatal hernia. Associated lung base atelectasis. 3. Chronic and benign findings of the spleen and left ovary. 4. Aortic Atherosclerosis (ICD10-I70.0). Electronically Signed   By: Genevie Ann M.D.   On: 06/29/2019 22:57   CT HEAD WO CONTRAST  Result Date: 07/20/2019 CLINICAL DATA:  Focal neuro deficit greater than 6 hours, altered level of consciousness, unexplained last known well this morning EXAM: CT HEAD WITHOUT CONTRAST TECHNIQUE: Contiguous axial images were obtained from the base of the skull through the vertex without intravenous contrast. COMPARISON:  CT 06/29/2019, MR and CT 04/26/2016 FINDINGS: Brain: No evidence of acute infarction, hemorrhage, hydrocephalus, extra-axial collection or mass lesion/mass effect. Symmetric prominence of the ventricles, cisterns and sulci compatible with parenchymal volume loss. Such findings are most pronounced in the posterior fossa with atrophy of the cerebellum and brainstem similar to comparison studies. Patchy areas of white matter hypoattenuation are most compatible with chronic microvascular angiopathy. Vascular: Atherosclerotic calcification of the carotid siphons. No hyperdense vessel. Skull: No calvarial fracture or suspicious osseous lesion. Few prominent venous lakes are similar to comparisons. No scalp swelling or hematoma. Sinuses/Orbits: Paranasal sinuses and mastoid air cells are predominantly clear. Orbital structures are unremarkable aside from prior lens extractions. Other: Moderate bilateral  temporomandibular joint osteoarthrosis. IMPRESSION: 1. No acute intracranial abnormality. 2. Parenchymal volume loss most notably in the posterior fossa involving the cerebellum and brainstem is similar to comparison. 3. 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 4. Moderate bilateral temporomandibular joint osteoarthrosis. Electronically Signed   By: Lovena Le M.D.   On: 07/20/2019 22:59   CT Head Wo Contrast  Result Date: 06/29/2019 CLINICAL DATA:  Altered level of consciousness EXAM: CT HEAD WITHOUT CONTRAST TECHNIQUE: Contiguous axial images were obtained from the base of the skull through the vertex without intravenous contrast. COMPARISON:  04/26/2016 FINDINGS: Brain: . There is atrophy and chronic small vessel disease changes. Cerebellar atrophy. No acute intracranial abnormality. Specifically, no hemorrhage, hydrocephalus, mass lesion, acute infarction, or significant intracranial injury. Vascular: No hyperdense vessel or unexpected calcification. Skull: No acute calvarial abnormality. Sinuses/Orbits: Visualized paranasal sinuses and mastoids clear. Orbital soft tissues unremarkable. Other: None IMPRESSION: Atrophy, chronic microvascular disease. No acute intracranial abnormality. Electronically Signed   By: Rolm Baptise M.D.   On: 06/29/2019 22:51   Renal Ultrasound  Result Date: 07/21/2019 CLINICAL DATA:  Acute kidney injury. EXAM: RENAL / URINARY TRACT ULTRASOUND COMPLETE COMPARISON:  CT abdomen and pelvis 06/29/2019. FINDINGS: Right Kidney: Renal measurements: 9.6 x 4.4 x 4.5 cm = volume: 99 mL . Echogenicity within normal limits. No solid mass or hydronephrosis visualized. 1.2 cm simple cyst noted. Left Kidney: Renal measurements: 9.5 x 4.4 x 4.5 cm = volume: 85 mL. Echogenicity within normal limits. No solid mass or hydronephrosis visualized. Simple 0.8 cm cyst noted. Bladder: Appears normal for degree of bladder distention. Other: None. IMPRESSION:  Negative for hydronephrosis.  No acute abnormality. Electronically Signed   By: Inge Rise M.D.   On:  07/21/2019 12:51   DG Chest Port 1 View  Result Date: 07/20/2019 CLINICAL DATA:  Altered mental status EXAM: PORTABLE CHEST 1 VIEW COMPARISON:  07/11/2019 FINDINGS: Cardiac shadow is enlarged. Aortic calcifications are again seen. The lungs are hypoinflated with mild central vascular congestion. No significant edema is seen. Small right-sided pleural effusion and right basilar atelectasis is noted. IMPRESSION: Mild vascular congestion with small right pleural effusion. Right basilar atelectasis is noted as well. Electronically Signed   By: Inez Catalina M.D.   On: 07/20/2019 22:38   DG Chest Port 1 View  Result Date: 07/11/2019 CLINICAL DATA:  Chest pain EXAM: PORTABLE CHEST 1 VIEW COMPARISON:  June 21, 2019 FINDINGS: Mild pulmonary vascular congestion. Trace pleural effusions. No pneumothorax. Similar cardiomegaly. Large hiatal hernia. IMPRESSION: Mild pulmonary vascular congestion and trace pleural effusions. Similar cardiomegaly. Electronically Signed   By: Macy Mis M.D.   On: 07/11/2019 09:14   DG Chest Portable 1 View  Result Date: 06/29/2019 CLINICAL DATA:  Shortness of breath EXAM: PORTABLE CHEST 1 VIEW COMPARISON:  06/24/2019, 06/21/2019, 06/07/2019 FINDINGS: Cardiomegaly with central vascular congestion and small pleural effusions. Large retrocardiac opacity corresponding to hiatal hernia. Aortic atherosclerosis. IMPRESSION: 1. Cardiomegaly with mild central congestion and trace pleural effusions. 2. Large hiatal hernia Electronically Signed   By: Donavan Foil M.D.   On: 06/29/2019 17:10    Microbiology: Recent Results (from the past 240 hour(s))  SARS CORONAVIRUS 2 (TAT 6-24 HRS)     Status: None   Collection Time: 07/17/19  9:28 PM  Result Value Ref Range Status   SARS Coronavirus 2 NEGATIVE NEGATIVE Final    Comment: (NOTE) SARS-CoV-2 target nucleic acids are NOT  DETECTED. The SARS-CoV-2 RNA is generally detectable in upper and lower respiratory specimens during the acute phase of infection. Negative results do not preclude SARS-CoV-2 infection, do not rule out co-infections with other pathogens, and should not be used as the sole basis for treatment or other patient management decisions. Negative results must be combined with clinical observations, patient history, and epidemiological information. The expected result is Negative. Fact Sheet for Patients: SugarRoll.be Fact Sheet for Healthcare Providers: https://www.woods-mathews.com/ This test is not yet approved or cleared by the [place] FDA and  has been authorized for detection and/or diagnosis of SARS-CoV-2 by FDA under an Emergency Use Authorization (EUA). This EUA will remain  in effect (meaning this test can be used) for the duration of the COVID-19 declaration under Section 56 4(b)(1) of the Act, 21 U.S.C. section 360bbb-3(b)(1), unless the authorization is terminated or revoked sooner. Performed at [facility], [place] [address], [place], [place] [address]   Respiratory Panel by RT PCR (Flu A&B, Covid) -     Status: None   Collection Time: 07/19/19 11:15 AM  Result Value Ref Range Status   SARS Coronavirus 2 by RT PCR NEGATIVE NEGATIVE Final    Comment: (NOTE) SARS-CoV-2 target nucleic acids are NOT DETECTED. The SARS-CoV-2 RNA is generally detectable in upper respiratoy specimens during the acute phase of infection. The lowest concentration of SARS-CoV-2 viral copies this assay can detect is 131 copies/mL. A negative result does not preclude SARS-Cov-2 infection and should not be used as the sole basis for treatment or other patient management decisions. A negative result may occur with  improper specimen collection/handling, submission of specimen other than nasopharyngeal swab, presence of viral mutation(s) within the areas  targeted by this assay, and inadequate number of viral copies (<131 copies/mL). A negative result  must be combined with clinical observations, patient history, and epidemiological information. The expected result is Negative. Fact Sheet for Patients:  PinkCheek.be Fact Sheet for Healthcare Providers:  GravelBags.it This test is not yet ap proved or cleared by the [place] FDA and  has been authorized for detection and/or diagnosis of SARS-CoV-2 by FDA under an Emergency Use Authorization (EUA). This EUA will remain  in effect (meaning this test can be used) for the duration of the COVID-19 declaration under Section 564(b)(1) of the Act, 21 U.S.C. section 360bbb-3(b)(1), unless the authorization is terminated or revoked sooner.    Influenza A by PCR NEGATIVE NEGATIVE Final   Influenza B by PCR NEGATIVE NEGATIVE Final    Comment: (NOTE) The Xpert Xpress SARS-CoV-2/FLU/RSV assay is intended as an aid in  the diagnosis of influenza from Nasopharyngeal swab specimens and  should not be used as a sole basis for treatment. Nasal washings and  aspirates are unacceptable for Xpert Xpress SARS-CoV-2/FLU/RSV  testing. Fact Sheet for Patients: PinkCheek.be Fact Sheet for Healthcare Providers: GravelBags.it This test is not yet approved or cleared by the [place] FDA and  has been authorized for detection and/or diagnosis of SARS-CoV-2 by  FDA under an Emergency Use Authorization (EUA). This EUA will remain  in effect (meaning this test can be used) for the duration of the  Covid-19 declaration under Section 564(b)(1) of the Act, 21  U.S.C. section 360bbb-3(b)(1), unless the authorization is  terminated or revoked. Performed at [facility], [address] [place], [place] [address]   MRSA PCR Screening     Status: None   Collection Time: 07/21/19  5:59 AM   Specimen:  Nasal Mucosa; Nasopharyngeal  Result Value Ref Range Status   MRSA by PCR NEGATIVE NEGATIVE Final    Comment:        The GeneXpert MRSA Assay (FDA approved for NASAL specimens only), is one component of a comprehensive MRSA colonization surveillance program. It is not intended to diagnose MRSA infection nor to guide or monitor treatment for MRSA infections. Performed at [facility], [address] [place], [place] [address]   Culture, Urine     Status: None   Collection Time: 07/21/19  1:34 PM   Specimen: Urine, Clean Catch  Result Value Ref Range Status   Specimen Description   Final    URINE, CLEAN CATCH Performed at [facility], [address], [place], [place] [address]    Special Requests   Final    NONE Performed at [facility], [address], [place], [place] [address]    Culture   Final    NO GROWTH Performed at [facility], [place] [address] [place], [place] [address]    Report Status 07/23/2019 FINAL  Final  Culture, blood (routine x 2)     Status: None   Collection Time: 07/21/19  2:07 PM   Specimen: BLOOD RIGHT HAND  Result Value Ref Range Status   Specimen Description BLOOD RIGHT HAND  Final   Special Requests   Final    BOTTLES DRAWN AEROBIC AND ANAEROBIC Blood Culture adequate volume   Culture   Final    NO GROWTH 5 DAYS Performed at [facility], [address] [place], [place] [address]    Report Status 07/26/2019 FINAL  Final  Culture, blood (routine x 2)     Status: None   Collection Time: 07/21/19  2:07 PM   Specimen: BLOOD LEFT ARM  Result Value Ref Range Status  Specimen Description BLOOD LEFT ARM  Final   Special Requests   Final    BOTTLES DRAWN AEROBIC AND ANAEROBIC Blood Culture adequate volume   Culture   Final    NO GROWTH 5 DAYS Performed at [facility], [address] [place], [place] [address]    Report Status 07/26/2019 FINAL  Final  Respiratory Panel by RT PCR (Flu A&B, Covid) - Nasopharyngeal Swab     Status: None    Collection Time: 07/26/19 11:21 AM   Specimen: Nasopharyngeal Swab  Result Value Ref Range Status   SARS Coronavirus 2 by RT PCR NEGATIVE NEGATIVE Final    Comment: (NOTE) SARS-CoV-2 target nucleic acids are NOT DETECTED. The SARS-CoV-2 RNA is generally detectable in upper respiratoy specimens during the acute phase of infection. The lowest concentration of SARS-CoV-2 viral copies this assay can detect is 131 copies/mL. A negative result does not preclude SARS-Cov-2 infection and should not be used as the sole basis for treatment or other patient management decisions. A negative result may occur with  improper specimen collection/handling, submission of specimen other than nasopharyngeal swab, presence of viral mutation(s) within the areas targeted by this assay, and inadequate number of viral copies (<131 copies/mL). A negative result must be combined with clinical observations, patient history, and epidemiological information. The expected result is Negative. Fact Sheet for Patients:  PinkCheek.be Fact Sheet for Healthcare Providers:  GravelBags.it This test is not yet ap proved or cleared by the [place] FDA and  has been authorized for detection and/or diagnosis of SARS-CoV-2 by FDA under an Emergency Use Authorization (EUA). This EUA will remain  in effect (meaning this test can be used) for the duration of the COVID-19 declaration under Section 564(b)(1) of the Act, 21 U.S.C. section 360bbb-3(b)(1), unless the authorization is terminated or revoked sooner.    Influenza A by PCR NEGATIVE NEGATIVE Final   Influenza B by PCR NEGATIVE NEGATIVE Final    Comment: (NOTE) The Xpert Xpress SARS-CoV-2/FLU/RSV assay is intended as an aid in  the diagnosis of influenza from Nasopharyngeal swab specimens and  should not be used as a sole basis for treatment. Nasal washings and  aspirates are unacceptable for Xpert Xpress  SARS-CoV-2/FLU/RSV  testing. Fact Sheet for Patients: PinkCheek.be Fact Sheet for Healthcare Providers: GravelBags.it This test is not yet approved or cleared by the [place] FDA and  has been authorized for detection and/or diagnosis of SARS-CoV-2 by  FDA under an Emergency Use Authorization (EUA). This EUA will remain  in effect (meaning this test can be used) for the duration of the  Covid-19 declaration under Section 564(b)(1) of the Act, 21  U.S.C. section 360bbb-3(b)(1), unless the authorization is  terminated or revoked. Performed at [facility], [address] [place], [place] [address]      Labs: Basic Metabolic Panel: Recent Labs  Lab 07/21/19 0535 07/22/19 0545 07/23/19 1808 07/24/19 0650 07/26/19 0600  NA 129* 134* 139 142 140  K 4.3 3.6 3.4* 3.4* 3.5  CL 94* 91* 102 104 106  CO2 16* 26 22 22 23   GLUCOSE 120* 194* 135* 112* 139*  BUN 99* 103* 116* 106* 103*  CREATININE 4.66* 4.58* 3.69* 3.36* 2.38*  CALCIUM 8.6* 8.0* 7.9* 8.1* 8.1*  PHOS  --   --   --  5.8* 4.0   Liver Function Tests: Recent Labs  Lab 07/20/19 2153 07/21/19 0535 07/24/19 0650 07/26/19 0600  AST 41 42*  --   --   ALT 37 36  --   --  ALKPHOS 90 87  --   --   BILITOT 0.7 0.9  --   --   PROT 6.8 7.0  --   --   ALBUMIN 3.1* 3.1* 2.5* 2.3*   CBC: Recent Labs  Lab 07/20/19 2153 07/21/19 0535  WBC 9.3 9.0  NEUTROABS 7.3  --   HGB 10.3* 10.2*  HCT 31.6* 32.3*  MCV 91.3 92.6  PLT 265 268    BNP (last 3 results) Recent Labs    06/07/19 1530  BNP 84.0    Signed:  Barton Dubois MD.  [facility] 07/26/2019, 2:14 PM

## 2019-07-26 NOTE — Progress Notes (Signed)
Inpatient hospice referral iniatiated by CSW with [facility]. COVID test order by MD.   TOC/CSW will continue to follow patient for any discharge related needs  [facility]  Clinical Social Worker  Ph: [phone]

## 2019-07-26 NOTE — Progress Notes (Signed)
Daily Progress Note   Patient Name: Tamara Stein       Date: 07/26/2019 DOB: 1941-06-25  Age: 78 y.o. MRN#: [id number] Attending Physician: Barton Dubois, MD Primary Care Physician: Janora Norlander, DO Admit Date: 07/20/2019  Reason for Consultation/Follow-up: Establishing goals of care and Non pain symptom management  Subjective: Patient in bed, opens eyes, does not make eye contact. Appears very lethargic. RR is increased, appears labored. She continues not to eat or drink. BUN remains elevated. Dr. Dyann Kief discussed [place] with patient's HCPOA and patient transitioned  ROS  Length of Stay: 5  Current Medications: Scheduled Meds:  . LORazepam  2 mg Intravenous Q6H    Continuous Infusions:   PRN Meds: acetaminophen **OR** acetaminophen, acetaminophen **OR** acetaminophen, antiseptic oral rinse, glycopyrrolate **OR** glycopyrrolate **OR** glycopyrrolate, haloperidol **OR** haloperidol **OR** haloperidol lactate, HYDROmorphone (DILAUDID) injection, ondansetron **OR** ondansetron (ZOFRAN) IV, polyvinyl alcohol  Physical Exam          Vital Signs: BP 119/72 (BP Location: Left Wrist)   Pulse 88   Temp 98.6 F (37 C) (Axillary)   Resp 20   Ht 5' 2"  (1.575 m)   Wt 82.8 kg   SpO2 97%   BMI 33.39 kg/m  SpO2: SpO2: 97 % O2 Device: O2 Device: Room Air O2 Flow Rate:    Intake/output summary:   Intake/Output Summary (Last 24 hours) at 07/26/2019 1141 Last data filed at 07/26/2019 0900 Gross per 24 hour  Intake 658.14 ml  Output 550 ml  Net 108.14 ml   LBM: Last BM Date: (pt unable to verbalize.) Baseline Weight: Weight: 77.5 kg Most recent weight: Weight: 82.8 kg       Palliative Assessment/Data: PPS: 10%     Patient Active Problem List   Diagnosis Date Noted    . Altered mental status   . Metabolic encephalopathy   . Goals of care, counseling/discussion   . Palliative care by specialist   . DNR (do not resuscitate)   . AKI (acute kidney injury) ([place]) 07/21/2019  . 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 Menopausal osteoporosis 07/15/2019  . Major depression, recurrent, chronic ([place]) 07/15/2019  . Infection due to Enterobacter species 07/08/2019  . Acute kidney injury superimposed on CKD ([place]) 07/08/2019  . Acute on chronic  diastolic (congestive) heart failure ([place]) 07/08/2019  . Atrial fibrillation with RVR ([place]) 06/22/2019  . Sepsis ([place]) 06/21/2019  . Ascending aortic aneurysm ([place]) 06/13/2019  . Pleural effusion 06/08/2019  . Dyspnea 06/07/2019  . Diarrhea   . Abnormal LFTs   . Symptomatic anemia 06/01/2019  . Nausea vomiting and diarrhea   . Lesion of spleen   . Diverticulitis 05/04/2019  . Internal impingement of left shoulder 12/24/2017  . Anxiety 04/20/2017  . 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 Incontinence of urine 04/20/2017  . UTI (urinary tract infection) 07/15/2016  . Abdominal pain 07/15/2016  . Colitis 07/15/2016  . Syncope and collapse 04/26/2016  . Lower urinary tract infectious disease 04/26/2016  . Fall   . Daytime somnolence 03/10/2016  . Diplopia 03/10/2016  . Insomnia 03/10/2016  . Sleep apnea in adult 03/10/2016  . Imbalance 03/07/2016  . Migraine 03/07/2016  . Tremor 03/07/2016  . Pain of both shoulder joints 02/29/2016  . Arthritis 09/24/2015  . Constipation 06/05/2015  . Depression 06/05/2015  . Hypothyroid 06/05/2015  . 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 GERD (gastroesophageal reflux disease)   . Essential hypertension with goal blood pressure less than 140/90   . Hyperlipidemia   . 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 CKD (chronic kidney disease) stage 3, GFR 30-59 ml/min (H[facility]) 12/02/2013    Palliative Care  Assessment & Plan   Patient Profile: 78 y.o. female  with past medical history of cerebellar degeneration, HTN, a fib, CKD III, anxiety and depression, HLD, hypothyroidism,  admitted on 07/20/2019 with AMS. Workup reveals acute on chronic kidney injury, metabolic acidosis, metabolic encepholapathy r/t uremia, acidosis, hyponatremia. Patient is not candidate for dialysis. Cr is improving with IV fluids, however, patient is not eating or drinking. Requiring IV lopressor for afib d/t not taking oral medications. She is not communicating. Palliative medicine consulted for [place].   Assessment/Recommendations/Plan   Will schedule lorazepam 32m IV q6hr given patient's significant history of anxiety and depression Add hydromophone [id number] for increased RR, air hunger  Goals of Care and Additional Recommendations:  Limitations on Scope of Treatment: Full Comfort Care  Code Status:  DNR  Prognosis:   < 2 weeks due to uremic metabolic encephalopathy, transition to full comfort measures  Discharge Planning:  Hospice facility  Care plan was discussed with Dr. MadDyann Kiefank you for allowing the Palliative Medicine Team to assist in the care of this patient.   Time In: 1130 Time Out: 1215 Total Time 45 mins Prolonged Time Billed no      Greater than 50%  of this time was spent counseling and coordinating care related to the above assessment and plan.  KasMariana KaufmanGNP-C Palliative Medicine   Please contact Palliative Medicine Team phone at [phone] questions and concerns.

## 2019-07-27 NOTE — Progress Notes (Signed)
This encounter was created in error - please disregard.

## 2019-07-30 ENCOUNTER — Other Ambulatory Visit: Payer: Self-pay | Admitting: Family Medicine

## 2019-08-08 ENCOUNTER — Ambulatory Visit: Payer: Medicare Other | Admitting: *Deleted

## 2019-08-08 NOTE — Chronic Care Management (AMB) (Signed)
  Chronic Care Management   Note  08/08/2019 Name: Tamara Stein MRN: [id number] DOB: 12/24/1940  I reached out to [name]'s family and was informed that she was transferred to [facility] after hospital discharge and that she died there on Aug 22, 2019. I offered condolences to the family.   Chong Sicilian, BSN, RN-BC Embedded Chronic Care Manager [facility] / [facility] Direct Dial: [phone]

## 2019-08-12 DEATH — deceased

## 2021-05-01 IMAGING — CT CT ABD-PELV W/O CM
2 of 4 series · 15 of 46 positions shown, 17 images · non-contrast
Comparison: CT 11/14/2018

CLINICAL DATA: Abdominal pain.  Diarrhea.

EXAM:
CT ABDOMEN AND PELVIS WITHOUT CONTRAST
TECHNIQUE: Multidetector CT imaging of the abdomen and pelvis was performed
following the standard protocol without IV contrast.

[Series 2: axial st · axial · 0.83mm/px · z∈[-889,-474]mm · 12 of 95 slices shown, 14 images]
[im 6/95  soft-tissue]
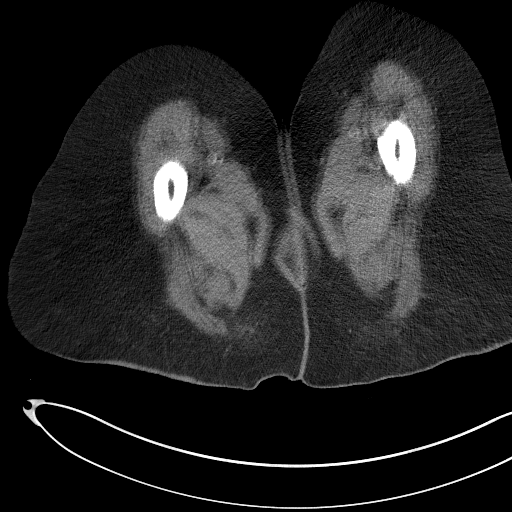
[im 6/95  bone]
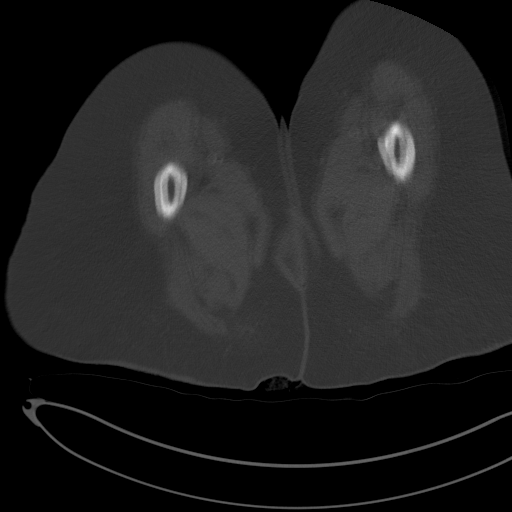
[im 16/95  soft-tissue]
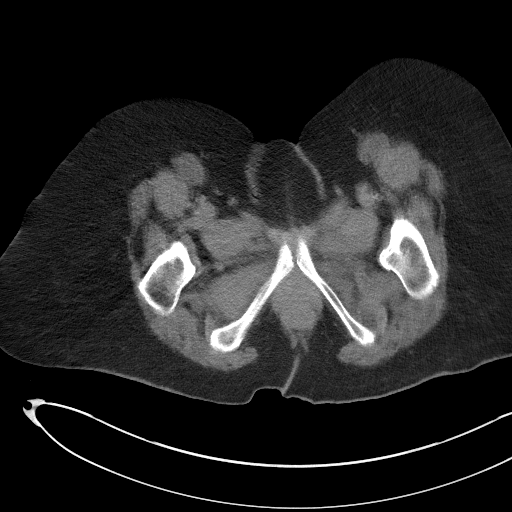
[im 21/95  soft-tissue]
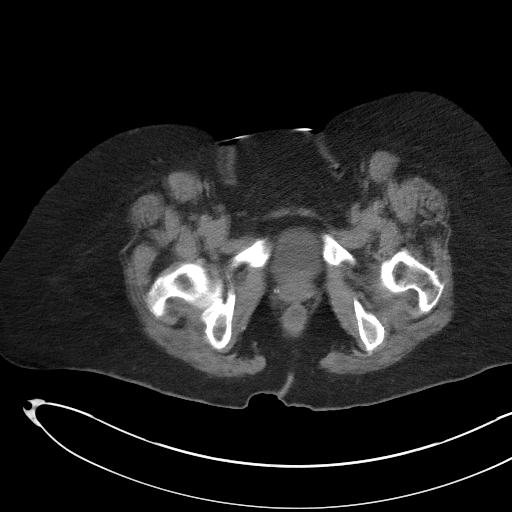
[im 27/95  soft-tissue]
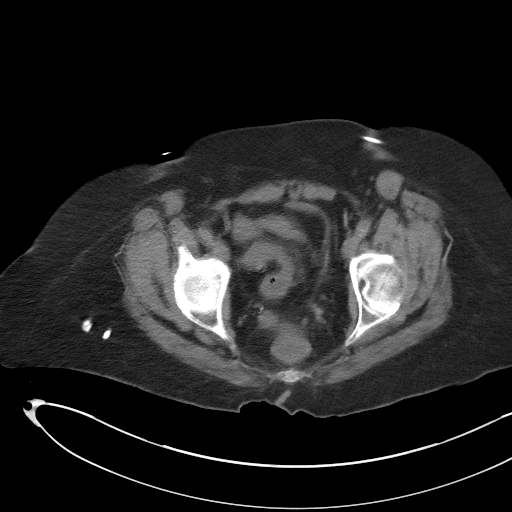
[im 37/95  soft-tissue]
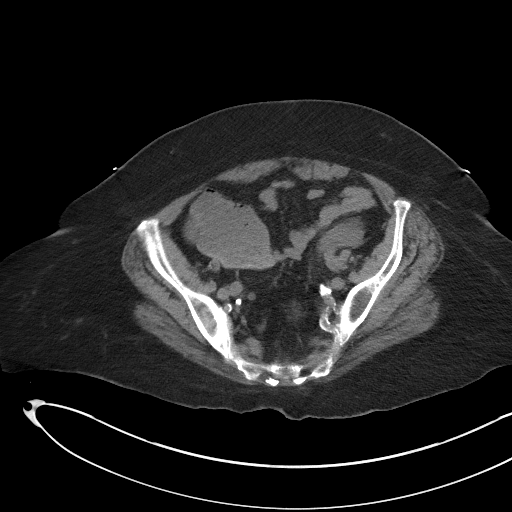
[im 42/95  soft-tissue]
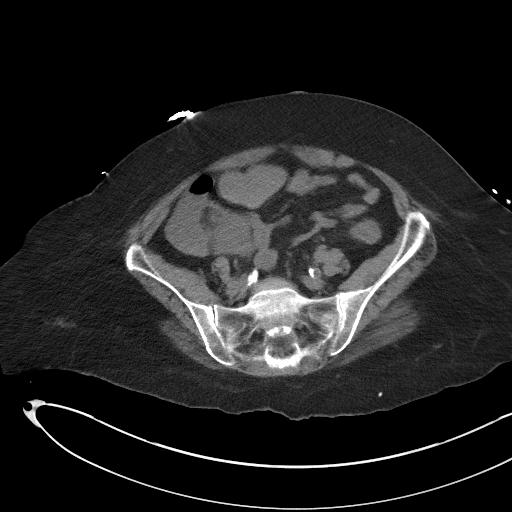
[im 53/95  soft-tissue]
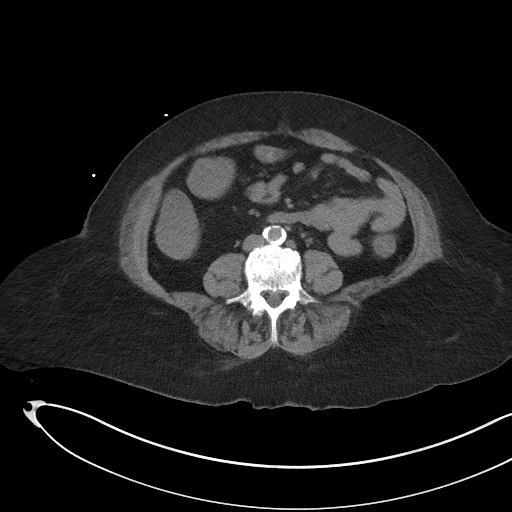
[im 58/95  soft-tissue]
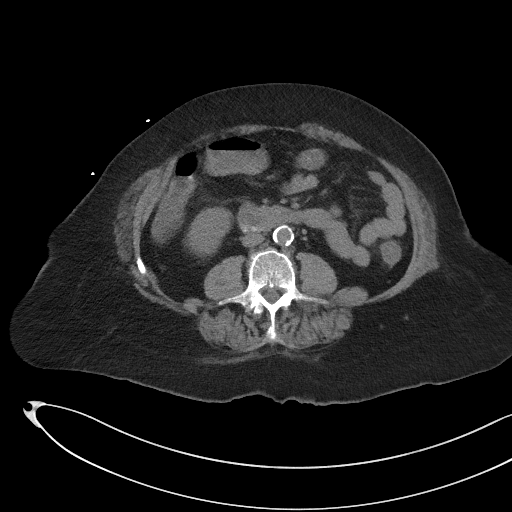
[im 68/95  soft-tissue]
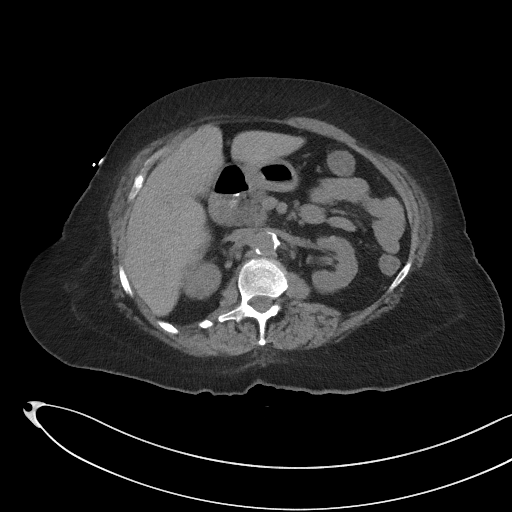
[im 68/95  bone]
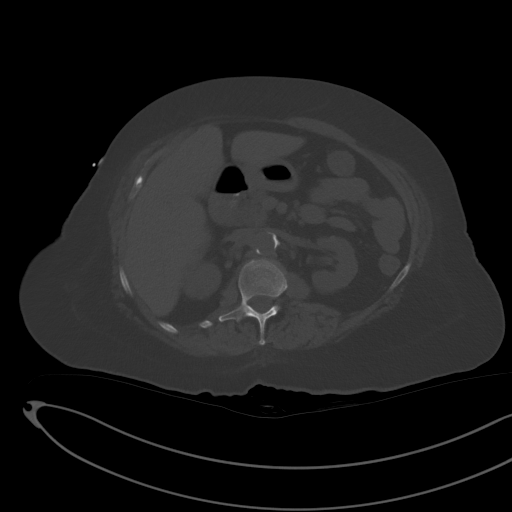
[im 74/95  soft-tissue]
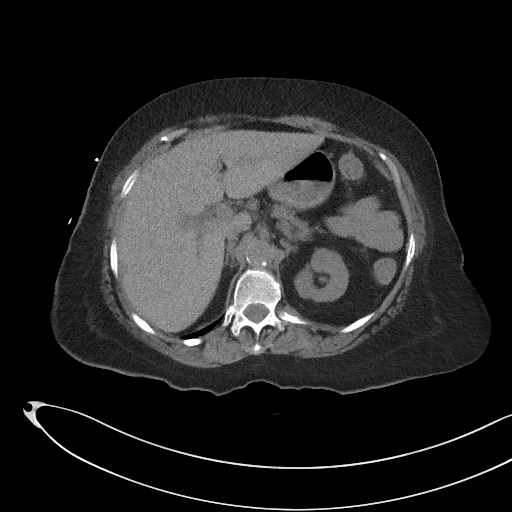
[im 79/95  soft-tissue]
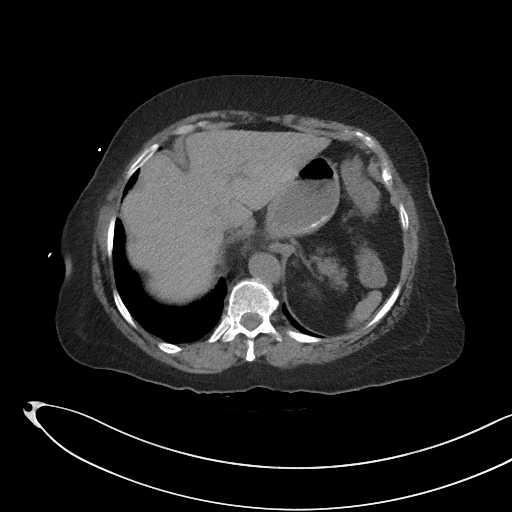
[im 89/95  soft-tissue]
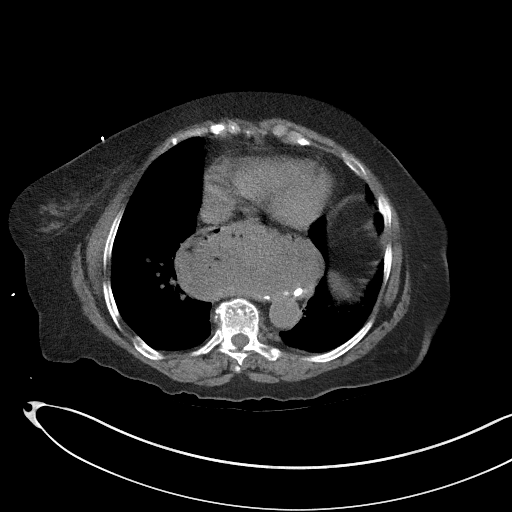

[Series 4: coronal st · coronal · 0.74mm/px · 3 of 88 slices shown]
[im 30/88  soft-tissue]
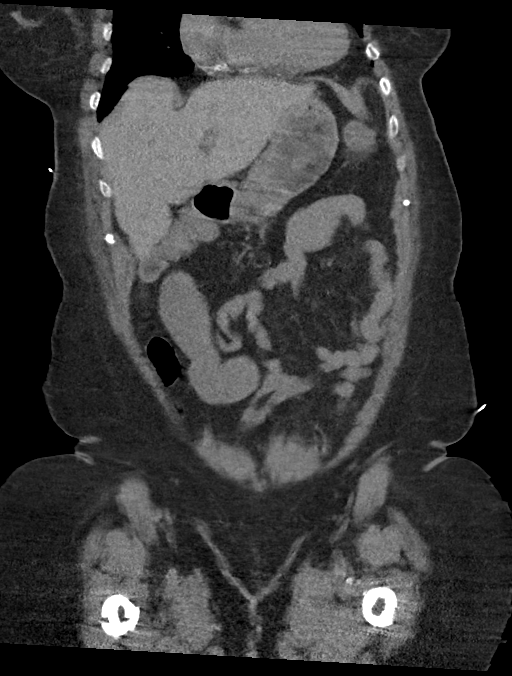
[im 39/88  soft-tissue]
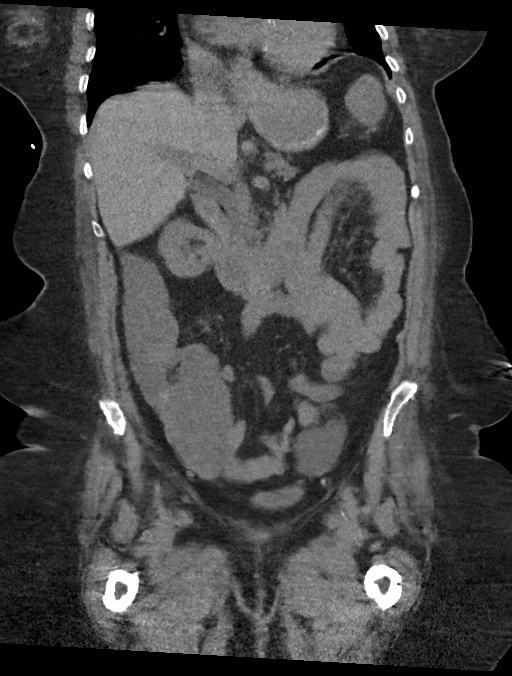
[im 49/88  soft-tissue]
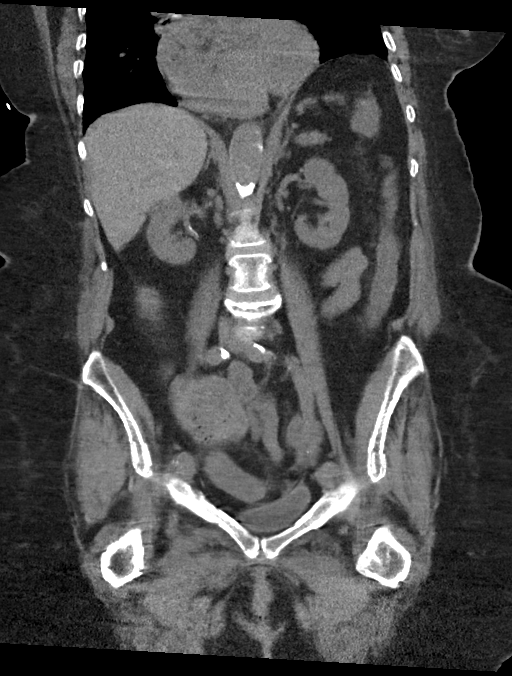

[15 of 46 positions shown; findings below may reference images not displayed]

FINDINGS: Lower chest: Lung bases are clear.  Large hiatal hernia.

Hepatobiliary: Gallbladder is absent. Normal appearance of the
liver. Extrahepatic bile duct appears to be prominent and similar to
the previous examination.

Pancreas: Unremarkable. No pancreatic ductal dilatation or
surrounding inflammatory changes.

Spleen: Again noted is a low-density lesion involving the superior
aspect of the spleen. This lesion roughly measures 3.0 x 2.6 x
cm. Lesion measured 3.0 x 2.5 x 1.4 cm in November 2018. Lesion was
probably present in 1506 but clearly much smaller.

Adrenals/Urinary Tract: Normal adrenal glands. Low-density renal
structures probably represent small cysts. No hydronephrosis.
Urinary bladder is unremarkable. Negative for kidney stones.

Stomach/Bowel: Large hiatal hernia. Normal appearance of the
duodenum. Mild inflammatory changes around the sigmoid colon and
there are few colonic diverticula involving the sigmoid colon. Mild
pericolonic edema in left lower quadrant involving the proximal
sigmoid colon and distal descending colon. Mild distention of the
cecum is similar to the previous examination. No small bowel
dilatation.

Vascular/Lymphatic: Atherosclerotic calcifications in the aorta and
iliac arteries without aneurysm. No significant lymph node
enlargement in the abdomen or pelvis.

Reproductive: Again noted is a low-density structure involving the
left adnexa measuring roughly 4.2 cm and stable from the exam in
November 2018 and minimally enlarged since 1506. Uterus has been
removed.

Other: Small amount of fluid in the pelvis.  Negative for free air.

Musculoskeletal: Chronic compression deformity involving superior
endplate of L4. Facet arthropathy in the lumbar spine.
IMPRESSION: 1. Trace free fluid in the pelvis and concern for mild inflammatory
changes around the sigmoid colon. There are colonic diverticula in
this area. Unfortunately, there is significant motion artifact in
this area that limits evaluation of the pelvis. Cannot exclude
diverticulitis involving the sigmoid colon. No evidence for an
abscess collection.
2. Low-density splenic lesion is nonspecific but could be cystic.
Lesion may have slightly enlarged since November 2018 but clearly
enlarged since 1506. Based on the evidence for enlargement since
1506, consider further characterization of the splenic lesion with
MRI or follow-up CT in 3-6 months to ensure stability.
3. Minimal change in the low-density left adnexal lesion. This
probably represents a cystic structure and favor a benign etiology
based on the minimal change since [DATE]. Large hiatal hernia.
5. Probable bilateral renal cysts.
